# Patient Record
Sex: Female | Born: 1945 | Hispanic: Yes | Marital: Married | Smoking: Never smoker
Health system: Southern US, Community
[De-identification: ages and names within clinical notes are randomized; demographics above are authoritative.]

## PROBLEM LIST (undated history)

## (undated) DIAGNOSIS — I351 Nonrheumatic aortic (valve) insufficiency: Secondary | ICD-10-CM

## (undated) DIAGNOSIS — M1990 Unspecified osteoarthritis, unspecified site: Secondary | ICD-10-CM

## (undated) DIAGNOSIS — R0989 Other specified symptoms and signs involving the circulatory and respiratory systems: Secondary | ICD-10-CM

## (undated) DIAGNOSIS — Z973 Presence of spectacles and contact lenses: Secondary | ICD-10-CM

## (undated) DIAGNOSIS — Z0000 Encounter for general adult medical examination without abnormal findings: Secondary | ICD-10-CM

## (undated) DIAGNOSIS — M25519 Pain in unspecified shoulder: Secondary | ICD-10-CM

## (undated) DIAGNOSIS — I340 Nonrheumatic mitral (valve) insufficiency: Secondary | ICD-10-CM

## (undated) DIAGNOSIS — G8929 Other chronic pain: Secondary | ICD-10-CM

## (undated) DIAGNOSIS — Z972 Presence of dental prosthetic device (complete) (partial): Secondary | ICD-10-CM

## (undated) DIAGNOSIS — N8110 Cystocele, unspecified: Secondary | ICD-10-CM

## (undated) DIAGNOSIS — M25552 Pain in left hip: Secondary | ICD-10-CM

## (undated) DIAGNOSIS — M25539 Pain in unspecified wrist: Secondary | ICD-10-CM

## (undated) DIAGNOSIS — J209 Acute bronchitis, unspecified: Secondary | ICD-10-CM

## (undated) DIAGNOSIS — R011 Cardiac murmur, unspecified: Secondary | ICD-10-CM

## (undated) DIAGNOSIS — M549 Dorsalgia, unspecified: Secondary | ICD-10-CM

## (undated) DIAGNOSIS — M2550 Pain in unspecified joint: Secondary | ICD-10-CM

## (undated) DIAGNOSIS — R000 Tachycardia, unspecified: Secondary | ICD-10-CM

## (undated) DIAGNOSIS — R040 Epistaxis: Principal | ICD-10-CM

## (undated) DIAGNOSIS — I739 Peripheral vascular disease, unspecified: Secondary | ICD-10-CM

## (undated) DIAGNOSIS — I10 Essential (primary) hypertension: Secondary | ICD-10-CM

## (undated) DIAGNOSIS — Z9289 Personal history of other medical treatment: Secondary | ICD-10-CM

## (undated) HISTORY — DX: Personal history of other medical treatment: Z92.89

## (undated) HISTORY — DX: Pain in unspecified joint: M25.50

## (undated) HISTORY — PX: BUNIONECTOMY: SHX129

## (undated) HISTORY — PX: WISDOM TOOTH EXTRACTION: SHX21

## (undated) HISTORY — DX: Tachycardia, unspecified: R00.0

## (undated) HISTORY — DX: Acute bronchitis, unspecified: J20.9

## (undated) HISTORY — DX: Cardiac murmur, unspecified: R01.1

## (undated) HISTORY — DX: Pain in unspecified wrist: M25.539

## (undated) HISTORY — DX: Pain in left hip: M25.552

## (undated) HISTORY — DX: Pain in unspecified shoulder: M25.519

## (undated) HISTORY — DX: Encounter for general adult medical examination without abnormal findings: Z00.00

## (undated) HISTORY — DX: Epistaxis: R04.0

## (undated) HISTORY — DX: Other specified symptoms and signs involving the circulatory and respiratory systems: R09.89

## (undated) HISTORY — PX: APPENDECTOMY: SHX54

## (undated) HISTORY — DX: Other chronic pain: G89.29

## (undated) HISTORY — DX: Essential (primary) hypertension: I10

---

## 1997-10-25 ENCOUNTER — Emergency Department (HOSPITAL_COMMUNITY): Admission: EM | Admit: 1997-10-25 | Discharge: 1997-10-25 | Payer: Self-pay | Admitting: Emergency Medicine

## 1998-03-03 ENCOUNTER — Other Ambulatory Visit: Admission: RE | Admit: 1998-03-03 | Discharge: 1998-03-03 | Payer: Self-pay | Admitting: Gynecology

## 1998-08-05 ENCOUNTER — Ambulatory Visit (HOSPITAL_COMMUNITY): Admission: RE | Admit: 1998-08-05 | Discharge: 1998-08-05 | Payer: Self-pay | Admitting: Gastroenterology

## 1999-01-28 ENCOUNTER — Other Ambulatory Visit: Admission: RE | Admit: 1999-01-28 | Discharge: 1999-01-28 | Payer: Self-pay | Admitting: Gynecology

## 1999-04-05 ENCOUNTER — Emergency Department (HOSPITAL_COMMUNITY): Admission: EM | Admit: 1999-04-05 | Discharge: 1999-04-05 | Payer: Self-pay | Admitting: Internal Medicine

## 1999-04-16 ENCOUNTER — Other Ambulatory Visit: Admission: RE | Admit: 1999-04-16 | Discharge: 1999-04-16 | Payer: Self-pay | Admitting: Gynecology

## 1999-04-16 ENCOUNTER — Encounter (INDEPENDENT_AMBULATORY_CARE_PROVIDER_SITE_OTHER): Payer: Self-pay

## 2000-03-03 ENCOUNTER — Other Ambulatory Visit: Admission: RE | Admit: 2000-03-03 | Discharge: 2000-03-03 | Payer: Self-pay | Admitting: Gynecology

## 2000-05-11 ENCOUNTER — Encounter: Admission: RE | Admit: 2000-05-11 | Discharge: 2000-08-09 | Payer: Self-pay | Admitting: Gynecology

## 2001-03-23 ENCOUNTER — Other Ambulatory Visit: Admission: RE | Admit: 2001-03-23 | Discharge: 2001-03-23 | Payer: Self-pay | Admitting: Gynecology

## 2002-03-23 ENCOUNTER — Other Ambulatory Visit: Admission: RE | Admit: 2002-03-23 | Discharge: 2002-03-23 | Payer: Self-pay | Admitting: Gynecology

## 2003-03-26 ENCOUNTER — Other Ambulatory Visit: Admission: RE | Admit: 2003-03-26 | Discharge: 2003-03-26 | Payer: Self-pay | Admitting: Gynecology

## 2004-04-03 ENCOUNTER — Other Ambulatory Visit: Admission: RE | Admit: 2004-04-03 | Discharge: 2004-04-03 | Payer: Self-pay | Admitting: Gynecology

## 2005-03-29 ENCOUNTER — Ambulatory Visit (HOSPITAL_COMMUNITY): Admission: RE | Admit: 2005-03-29 | Discharge: 2005-03-29 | Payer: Self-pay | Admitting: Gastroenterology

## 2005-03-29 LAB — HM COLONOSCOPY: HM Colonoscopy: NORMAL

## 2005-04-07 ENCOUNTER — Other Ambulatory Visit: Admission: RE | Admit: 2005-04-07 | Discharge: 2005-04-07 | Payer: Self-pay | Admitting: Gynecology

## 2005-04-23 ENCOUNTER — Ambulatory Visit: Payer: Self-pay | Admitting: Internal Medicine

## 2005-07-06 ENCOUNTER — Ambulatory Visit: Payer: Self-pay | Admitting: Internal Medicine

## 2005-07-11 ENCOUNTER — Encounter: Admission: RE | Admit: 2005-07-11 | Discharge: 2005-07-11 | Payer: Self-pay | Admitting: Internal Medicine

## 2006-05-18 ENCOUNTER — Other Ambulatory Visit: Admission: RE | Admit: 2006-05-18 | Discharge: 2006-05-18 | Payer: Self-pay | Admitting: Gynecology

## 2006-05-27 ENCOUNTER — Ambulatory Visit: Payer: Self-pay | Admitting: Internal Medicine

## 2006-08-15 ENCOUNTER — Emergency Department (HOSPITAL_COMMUNITY): Admission: EM | Admit: 2006-08-15 | Discharge: 2006-08-15 | Payer: Self-pay | Admitting: Emergency Medicine

## 2006-08-16 ENCOUNTER — Ambulatory Visit: Payer: Self-pay | Admitting: Internal Medicine

## 2006-08-17 ENCOUNTER — Ambulatory Visit: Payer: Self-pay

## 2006-08-19 ENCOUNTER — Ambulatory Visit: Payer: Self-pay | Admitting: Internal Medicine

## 2006-08-29 ENCOUNTER — Ambulatory Visit: Payer: Self-pay | Admitting: Internal Medicine

## 2006-10-20 ENCOUNTER — Ambulatory Visit: Payer: Self-pay | Admitting: Internal Medicine

## 2006-10-27 ENCOUNTER — Encounter: Admission: RE | Admit: 2006-10-27 | Discharge: 2006-10-27 | Payer: Self-pay | Admitting: Internal Medicine

## 2007-04-26 ENCOUNTER — Ambulatory Visit: Payer: Self-pay | Admitting: Internal Medicine

## 2007-05-31 ENCOUNTER — Ambulatory Visit: Payer: Self-pay | Admitting: Internal Medicine

## 2007-06-01 DIAGNOSIS — J309 Allergic rhinitis, unspecified: Secondary | ICD-10-CM | POA: Insufficient documentation

## 2007-06-01 DIAGNOSIS — I10 Essential (primary) hypertension: Secondary | ICD-10-CM

## 2007-07-05 ENCOUNTER — Encounter: Payer: Self-pay | Admitting: Internal Medicine

## 2007-07-07 ENCOUNTER — Encounter: Payer: Self-pay | Admitting: Internal Medicine

## 2007-11-07 ENCOUNTER — Telehealth: Payer: Self-pay | Admitting: Internal Medicine

## 2007-11-07 ENCOUNTER — Ambulatory Visit: Payer: Self-pay | Admitting: Internal Medicine

## 2007-11-07 DIAGNOSIS — J069 Acute upper respiratory infection, unspecified: Secondary | ICD-10-CM | POA: Insufficient documentation

## 2007-11-07 DIAGNOSIS — R000 Tachycardia, unspecified: Secondary | ICD-10-CM

## 2007-11-07 LAB — CONVERTED CEMR LAB
CO2: 31 meq/L (ref 19–32)
Calcium: 9.8 mg/dL (ref 8.4–10.5)
Creatinine, Ser: 0.8 mg/dL (ref 0.4–1.2)
Free T4: 0.9 ng/dL (ref 0.6–1.6)
GFR calc Af Amer: 93 mL/min
GFR calc non Af Amer: 77 mL/min
Magnesium: 2.2 mg/dL (ref 1.5–2.5)
Sodium: 140 meq/L (ref 135–145)

## 2007-11-08 ENCOUNTER — Telehealth: Payer: Self-pay | Admitting: Internal Medicine

## 2007-11-13 ENCOUNTER — Encounter: Payer: Self-pay | Admitting: Internal Medicine

## 2007-11-13 ENCOUNTER — Ambulatory Visit: Payer: Self-pay

## 2007-11-14 ENCOUNTER — Telehealth: Payer: Self-pay | Admitting: Internal Medicine

## 2007-12-07 ENCOUNTER — Ambulatory Visit: Payer: Self-pay | Admitting: Internal Medicine

## 2007-12-07 DIAGNOSIS — M722 Plantar fascial fibromatosis: Secondary | ICD-10-CM

## 2008-05-10 ENCOUNTER — Ambulatory Visit: Payer: Self-pay | Admitting: Internal Medicine

## 2008-05-10 LAB — CONVERTED CEMR LAB
ALT: 19 units/L (ref 0–35)
Albumin: 4.4 g/dL (ref 3.5–5.2)
Alkaline Phosphatase: 67 units/L (ref 39–117)
BUN: 22 mg/dL (ref 6–23)
Bilirubin, Direct: 0.2 mg/dL (ref 0.0–0.3)
CO2: 31 meq/L (ref 19–32)
Calcium: 9.6 mg/dL (ref 8.4–10.5)
Eosinophils Relative: 3.5 % (ref 0.0–5.0)
GFR calc Af Amer: 108 mL/min
Glucose, Bld: 102 mg/dL — ABNORMAL HIGH (ref 70–99)
HCT: 40.7 % (ref 36.0–46.0)
Hemoglobin: 14.4 g/dL (ref 12.0–15.0)
Leukocytes, UA: NEGATIVE
Lymphocytes Relative: 32.9 % (ref 12.0–46.0)
Monocytes Absolute: 0.5 10*3/uL (ref 0.1–1.0)
Monocytes Relative: 8.9 % (ref 3.0–12.0)
Neutro Abs: 3.3 10*3/uL (ref 1.4–7.7)
Nitrite: NEGATIVE
Potassium: 4.2 meq/L (ref 3.5–5.1)
RDW: 11.3 % — ABNORMAL LOW (ref 11.5–14.6)
Sodium: 142 meq/L (ref 135–145)
Specific Gravity, Urine: 1.01 (ref 1.000–1.03)
Total CHOL/HDL Ratio: 8.8
Total Protein, Urine: NEGATIVE mg/dL
Total Protein: 7.6 g/dL (ref 6.0–8.3)
WBC: 6 10*3/uL (ref 4.5–10.5)
pH: 7 (ref 5.0–8.0)

## 2008-05-14 ENCOUNTER — Ambulatory Visit: Payer: Self-pay | Admitting: Internal Medicine

## 2008-06-25 ENCOUNTER — Encounter: Admission: RE | Admit: 2008-06-25 | Discharge: 2008-06-25 | Payer: Self-pay | Admitting: Obstetrics and Gynecology

## 2008-07-30 ENCOUNTER — Encounter: Payer: Self-pay | Admitting: Gastroenterology

## 2008-07-30 ENCOUNTER — Encounter: Payer: Self-pay | Admitting: Internal Medicine

## 2008-09-05 ENCOUNTER — Encounter: Payer: Self-pay | Admitting: Gastroenterology

## 2008-10-24 ENCOUNTER — Telehealth: Payer: Self-pay | Admitting: Internal Medicine

## 2008-11-27 ENCOUNTER — Telehealth (INDEPENDENT_AMBULATORY_CARE_PROVIDER_SITE_OTHER): Payer: Self-pay | Admitting: *Deleted

## 2008-12-02 ENCOUNTER — Ambulatory Visit: Payer: Self-pay | Admitting: Internal Medicine

## 2008-12-02 ENCOUNTER — Telehealth: Payer: Self-pay | Admitting: Internal Medicine

## 2008-12-02 DIAGNOSIS — R198 Other specified symptoms and signs involving the digestive system and abdomen: Secondary | ICD-10-CM

## 2008-12-02 DIAGNOSIS — K5900 Constipation, unspecified: Secondary | ICD-10-CM | POA: Insufficient documentation

## 2008-12-04 ENCOUNTER — Ambulatory Visit: Payer: Self-pay | Admitting: Internal Medicine

## 2008-12-04 LAB — CONVERTED CEMR LAB
Direct LDL: 135.3 mg/dL
HDL: 26.5 mg/dL — ABNORMAL LOW (ref >39.00)
Total CHOL/HDL Ratio: 8
Triglycerides: 176 mg/dL — ABNORMAL HIGH (ref 0.0–149.0)

## 2008-12-16 ENCOUNTER — Encounter: Payer: Self-pay | Admitting: Internal Medicine

## 2009-07-04 ENCOUNTER — Ambulatory Visit: Payer: Self-pay | Admitting: Internal Medicine

## 2009-07-04 ENCOUNTER — Telehealth (INDEPENDENT_AMBULATORY_CARE_PROVIDER_SITE_OTHER): Payer: Self-pay | Admitting: *Deleted

## 2009-07-04 DIAGNOSIS — R209 Unspecified disturbances of skin sensation: Secondary | ICD-10-CM

## 2009-07-04 DIAGNOSIS — E782 Mixed hyperlipidemia: Secondary | ICD-10-CM

## 2009-07-04 LAB — CONVERTED CEMR LAB
Basophils Relative: 0.8 % (ref 0.0–3.0)
Calcium: 9.9 mg/dL (ref 8.4–10.5)
Cholesterol: 205 mg/dL — ABNORMAL HIGH (ref 0–200)
Creatinine, Ser: 0.6 mg/dL (ref 0.4–1.2)
Direct LDL: 135.5 mg/dL
Eosinophils Absolute: 0.3 10*3/uL (ref 0.0–0.7)
Eosinophils Relative: 4.4 % (ref 0.0–5.0)
GFR calc non Af Amer: 106.53 mL/min (ref >60)
HDL: 28.6 mg/dL — ABNORMAL LOW (ref >39.00)
Hemoglobin: 14.3 g/dL (ref 12.0–15.0)
Lymphocytes Relative: 32.7 % (ref 12.0–46.0)
Monocytes Relative: 7.1 % (ref 3.0–12.0)
Neutrophils Relative %: 55 % (ref 43.0–77.0)
RBC: 4.56 M/uL (ref 3.87–5.11)
Sodium: 140 meq/L (ref 135–145)
Total CHOL/HDL Ratio: 7
Triglycerides: 226 mg/dL — ABNORMAL HIGH (ref 0.0–149.0)
WBC: 7.4 10*3/uL (ref 4.5–10.5)

## 2009-07-08 ENCOUNTER — Encounter: Payer: Self-pay | Admitting: Gastroenterology

## 2009-07-16 ENCOUNTER — Telehealth: Payer: Self-pay | Admitting: Internal Medicine

## 2009-08-01 ENCOUNTER — Ambulatory Visit: Payer: Self-pay | Admitting: Gastroenterology

## 2009-08-01 DIAGNOSIS — K6289 Other specified diseases of anus and rectum: Secondary | ICD-10-CM

## 2009-08-04 ENCOUNTER — Ambulatory Visit: Payer: Self-pay | Admitting: Gastroenterology

## 2009-08-04 ENCOUNTER — Telehealth: Payer: Self-pay | Admitting: Gastroenterology

## 2009-08-04 LAB — HM SIGMOIDOSCOPY

## 2009-08-05 ENCOUNTER — Telehealth (INDEPENDENT_AMBULATORY_CARE_PROVIDER_SITE_OTHER): Payer: Self-pay | Admitting: *Deleted

## 2009-08-12 ENCOUNTER — Ambulatory Visit (HOSPITAL_COMMUNITY): Admission: RE | Admit: 2009-08-12 | Discharge: 2009-08-12 | Payer: Self-pay | Admitting: Gastroenterology

## 2010-05-29 ENCOUNTER — Ambulatory Visit: Payer: Self-pay | Admitting: Internal Medicine

## 2010-07-23 ENCOUNTER — Encounter: Payer: Self-pay | Admitting: Internal Medicine

## 2010-07-23 ENCOUNTER — Ambulatory Visit
Admission: RE | Admit: 2010-07-23 | Discharge: 2010-07-23 | Payer: Self-pay | Source: Home / Self Care | Attending: Internal Medicine | Admitting: Internal Medicine

## 2010-07-23 ENCOUNTER — Other Ambulatory Visit: Payer: Self-pay | Admitting: Internal Medicine

## 2010-07-23 DIAGNOSIS — M25549 Pain in joints of unspecified hand: Secondary | ICD-10-CM | POA: Insufficient documentation

## 2010-07-23 LAB — SEDIMENTATION RATE: Sed Rate: 11 mm/hr (ref 0–22)

## 2010-08-20 NOTE — Assessment & Plan Note (Signed)
Summary: painful joints/last ov 2010/cd   Vital Signs:  Patient profile:   65 year old female Height:      59 inches Weight:      123 pounds BMI:     24.93 O2 Sat:      97 % on Room air Temp:     98.5 degrees F oral Pulse rate:   61 / minute BP sitting:   142 / 88  (left arm) Cuff size:   regular  Vitals Entered By: Ami Bullins CMA (July 23, 2010 10:21 AM)  O2 Flow:  Room air CC: pt here with c/o painful joints (mostly in her hands) symptoms present x 5 months/ ab   Primary Care Provider:  Micheal Maliyah Willets,MD  CC:  pt here with c/o painful joints (mostly in her hands) symptoms present x 5 months/ ab.  History of Present Illness: Patient present c/o pain and swelling in the PIP joints of the left hand, to the point where she can't wear her rings. She has been reading about arthritis and is concerned that she may have RA. She is also inquiring about possible treatments. She has normal use of her hand. She does have a morning gel of less than 15 min. She has normal feeling in the hand.   Current Medications (verified): 1)  Lisinopril 10 Mg  Tabs (Lisinopril) .... Once Daily 2)  Metoprolol Tartrate 25 Mg  Tabs (Metoprolol Tartrate) .... 1/2 Tab Two Times A Day 3)  Fish Oil 1000 Mg Caps (Omega-3 Fatty Acids) .... Take 1 Tablet By Mouth Once A Day 4)  Super B Complex  Tabs (B Complex-C) .Marland Kitchen.. 1 Tablet Daily 5)  Vitamin D3 1000 Unit Caps (Cholecalciferol) .Marland Kitchen.. 1 Capsule Daily  Allergies (verified): 1)  ! Penicillin  Past History:  Past Medical History: Last updated: 08/01/2009 Allergic rhinitis Hypertension tachycardia- episodic   Past Surgical History: Last updated: 08/01/2009 Appendectomy G1P1 FH reviewed for relevance, SH/Risk Factors reviewed for relevance  Review of Systems  The patient denies anorexia, fever, weight loss, weight gain, peripheral edema, abdominal pain, muscle weakness, and enlarged lymph nodes.    Physical Exam  General:   Well-developed,well-nourished,in no acute distress; alert,appropriate and cooperative throughout examination Eyes:  C&S clear Lungs:  normal respiratory effort.   Heart:  normal rate and regular rhythm.   Msk:  DIP left hand enlarged especially 3rd and 4th digits. No erythema, no synovial thickening, well preserved range of motion. Wrist, elbow, shoulder OK.  Neurologic:  alert & oriented X3 and gait normal.   Skin:  turgor normal and color normal.   Psych:  Oriented X3, normally interactive, and good eye contact.     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 Reviewed the mechanism of disease and the difference between RA and OA. Discussed treatments including ROM and exercise, use of warm soaks, use of glucosamine for large joint involvement, APAP as analgesia and NSAIDs as needed for acute inflammation.  Plan - lab to r/o RA - RF, ESR           exercise/ROM and heat. APAP as needed. Forinflammation NSAID of choice with GI precautions given.  Orders: TLB-Sedimentation Rate (ESR) (85652-ESR) T-Rheumatoid Factor (54098-11914)  Addenddum - ESR 15mm/hr, RA negative  Complete Medication List: 1)  Lisinopril 10 Mg Tabs (Lisinopril) .... Once daily 2)  Metoprolol Tartrate 25 Mg Tabs (Metoprolol tartrate) .... 1/2 tab two times  a day 3)  Fish Oil 1000 Mg Caps (Omega-3 fatty acids) .... Take 1 tablet by mouth once a day 4)  Super B Complex Tabs (B complex-c) .Marland Kitchen.. 1 tablet daily 5)  Vitamin D3 1000 Unit Caps (Cholecalciferol) .Marland Kitchen.. 1 capsule daily   Orders Added: 1)  TLB-Sedimentation Rate (ESR) [85652-ESR] 2)  T-Rheumatoid Factor [[address]] 3)  Est. Patient Level III [09811]

## 2010-08-20 NOTE — Assessment & Plan Note (Signed)
History of Present Illness Visit Type: Initial Visit Primary GI MD: Rob Bunting MD Primary Provider: Cristal Deer Requesting Provider: n/a Chief Complaint: second opinion History of Present Illness:       is very pleasant 65 year old [place] born woman who had colonoscopy with Dr. Loreta Ave in 2006, no polyps or cancers.  she did have diverticulosis.   February 2010 was found to have anal fissure.  She was given diltiazem ointment.  This did not help.  she saw Dr. Yetta Barre who gave her proctofoam, this helped but did not get rid of the anal discomfort (has anal pain, constant, not throbbing, dull pain). Having a BM does cuase a bit of pain while the BM is passing through.  She never sees rectal bleeding.    she actually tells me that the pain is not anal pain but is more higher up in her rectum.  Went back to Dr. Kenna Gilbert office, was very unsatisfied with her visit at that point.           Current Medications (verified): 1)  Lisinopril 10 Mg  Tabs (Lisinopril) .... Once Daily 2)  Calcium 500 Mg Tabs (Calcium Carbonate) .... Take 1 Tablet By Mouth Two Times A Day 3)  Metoprolol Tartrate 25 Mg  Tabs (Metoprolol Tartrate) .... 1/2 Tab Two Times A Day 4)  Fish Oil 1000 Mg Caps (Omega-3 Fatty Acids) .... Take 1 Tablet By Mouth Once A Day 5)  Aspirin 81 Mg Tbec (Aspirin) .Marland Kitchen.. 1 By Mouth Once Daily  Allergies (verified): 1)  ! 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 Never Smoked Alcohol use-no Drug use-no  Regular  exercise-yes  Review of Systems       Pertinent positive and negative review of systems were noted in the above HPI and GI specific review of systems.  All other review of systems was otherwise negative.   Vital Signs:  Patient profile:   65 year old female Height:      59 inches Weight:      125 pounds BMI:     25.34 BSA:     1.51 Pulse rate:   62 / minute Pulse rhythm:   regular BP sitting:   104 / 70  (left arm)  Vitals Entered By: Merri Ray CMA Duncan Dull) (August 01, 2009 3:27 PM)  Physical Exam  Additional Exam:  Constitutional: generally well appearing Psychiatric: alert and oriented times 3 Eyes: extraocular movements intact Mouth: oropharynx moist, no lesions Neck: supple, no lymphadenopathy Cardiovascular: heart regular rate and rythm Lungs: CTA bilaterally Abdomen: soft, non-tender, non-distended, no obvious ascites, no peritoneal signs, normal bowel sounds Extremities: no lower extremity edema bilaterally Skin: no lesions on visible extremities Ano rectal examination with female CMA in room: no obvious external anal fissures or hemorrhoids, no exquisite anal tenderness, no rectal masses, she was tender, fairly focally, about 5-6 cm into her rectum on the right lateral wall. I could detect no fluctuance or masses underneath this. Stool was heme negative, brown   Impression & Recommendations:  Problem # 1:  rectal discomfort she has been told that she  has an anal fissure however she has never really had anal pain and I could not find a seizure or hemorrhoids on examination now. She does not have exquisite anal tenderness which is fairly common with anal fissures. She does have point tenderness about 5-6 cm into her rectum on the right lateral sidewall. There were no clear masses here in the stool is brown and Hemoccult negative. I think we should proceed with flexible sigmoidoscopy to rule out neoplastic, colonic causes although I think this will be negative. If  indeed it is negative then I would set her up with a pelvic MRI.  Patient Instructions: 1)  You will be scheduled to have a flexible sigmoidoscopy. 2)  If this is unrevealing, will likely set up a pelvic MRI. 3)  A copy of this information will be sent to Dr. Debby Bud. 4)  The medication list was reviewed and reconciled.  All changed / newly prescribed medications were explained.  A complete medication list was provided to the patient / caregiver.  Appended Document: Orders Update/Flex    Clinical Lists Changes  Orders: Added new Test order of Flex with Sedation (Flex w/Sed) - Signed

## 2010-08-20 NOTE — Letter (Signed)
Summary: [facility]  [address] [place], [place] [address]   Phone: [phone]  Fax: [phone]       Eileen Hansen    May 09, 1946    MRN: [id number]        Procedure Day /Date:08/04/09     Arrival Time:9 am     Procedure Time:10 am     Location of Procedure:                    X  [facility] (4th Floor)    PREPARATION FOR FLEXIBLE SIGMOIDOSCOPY WITH MAGNESIUM CITRATE  Prior to the day before your procedure, purchase one 8 oz. bottle of Magnesium Citrate and one Fleet Enema from the laxative section of your drugstore.  _________________________________________________________________________________________________  THE DAY BEFORE YOUR PROCEDURE             DATE: 08/03/09 DAY: SUN  1.   Have a clear liquid dinner the night before your procedure.  2.   Do not drink anything colored red or purple.  Avoid juices with pulp.  No orange juice.              CLEAR LIQUIDS INCLUDE: Water Jello Ice Popsicles Tea (sugar ok, no milk/cream) Powdered fruit flavored drinks Coffee (sugar ok, no milk/cream) Gatorade Juice: apple, white grape, white cranberry  Lemonade Clear bullion, consomm, broth Carbonated beverages (any kind) Strained chicken noodle soup Hard Candy   3.   At 7:00 pm the night before your procedure, drink one bottle of Magnesium Citrate over ice.  4.   Drink at least 3 more glasses of clear liquids before bedtime (preferably juices).  5.   Results are expected usually within 1 to 6 hours after taking the Magnesium Citrate.  ___________________________________________________________________________________________________  THE DAY OF YOUR PROCEDURE            DATE: 08/04/09 DAY: MON  1.   Use Fleet Enema one hour prior to coming for procedure.  2.   You may drink clear liquids until 8 am(2 hours before exam)       MEDICATION INSTRUCTIONS  Unless otherwise instructed, you should take regular  prescription medications with a small sip of water as early as possible the morning of your procedure.           OTHER INSTRUCTIONS  You will need a responsible adult at least 67 years of age to accompany you and drive you home.   This person must remain in the waiting room during your procedure.  Wear loose fitting clothing that is easily removed.  Leave jewelry and other valuables at home.  However, you may wish to bring a book to read or an iPod/MP3 player to listen to music as you wait for your procedure to start.  Remove all body piercing jewelry and leave at home.  Total time from sign-in until discharge is approximately 2-3 hours.  You should go home directly after your procedure and rest.  You can resume normal activities the day after your procedure.  The day of your procedure you should not:   Drive   Make legal decisions   Operate machinery   Drink alcohol   Return to work  You will receive specific instructions about eating, activities and medications before you leave.   The above instructions have been reviewed and explained to me by   _______________________    I fully understand and can verbalize these instructions _____________________________ Date _________

## 2010-08-20 NOTE — Procedures (Signed)
Summary: Flexible Sigmoidoscopy  Patient: Eileen Hansen Note: All result statuses are Final unless otherwise noted.  Tests: (1) Flexible Sigmoidoscopy (FLX)  FLX Flexible Sigmoidoscopy                             DONE     [facility]     [address]     [place], [place]  [address]           FLEXIBLE SIGMOIDOSCOPY PROCEDURE REPORT           PATIENT:  Eileen Hansen, Eileen Hansen  MR#:  [id number]     BIRTHDATE:  29-Oct-1945, 63 yrs. old  GENDER:  female           ENDOSCOPIST:  Rachael Fee, MD     Referred by:  Rosalyn Gess. Norins, M.D.           PROCEDURE DATE:  08/04/2009     PROCEDURE:  Flexible Sigmoidoscopy, diagnostic     ASA CLASS:  Class II     INDICATIONS:  rectal discomfort           MEDICATIONS:   Fentanyl 50 mcg IV, Versed 5 mg IV           DESCRIPTION OF PROCEDURE:   After the risks benefits and     alternatives of the procedure were thoroughly explained, informed     consent was obtained.  Digital rectal exam was performed and     revealed no rectal masses.   The [id number] endoscope     was introduced through the anus and advanced to the transverse     colon, without limitations.  The quality of the prep was adequate.     The instrument was then slowly withdrawn as the mucosa was fully     examined.     <<PROCEDUREIMAGES>>           Internal hemorrhoids were found. These were small and not     thrombosed.  The examination was otherwise normal (see image1,     image3, image4, and image6).   Retroflexed views in the rectum     revealed no abnormalities.    The scope was then withdrawn from     the patient and the procedure terminated.           COMPLICATIONS:  None           ENDOSCOPIC IMPRESSION:     1) Small internal hemorrhoids     2) Otherwise normal examination.  No fissures.  No proctitis.     No explanation for the rectal discomfort.           RECOMMENDATIONS:     My office will arrange for you to have a pelvic MRI to examine     rectal  discomfort.           ______________________________     Rachael Fee, MD           n.     eSIGNED:   Rachael Fee at 08/04/2009 10:40 AM           Eileen Hansen, [id number]  Note: An exclamation mark (!) indicates a result that was not dispersed into the flowsheet. Document Creation Date: 08/04/2009 10:39 AM _______________________________________________________________________  (1) Order result status: Final Collection or observation date-time: 08/04/2009 10:33 Requested date-time:  Receipt date-time:  Reported date-time:  Referring Physician:   Ordering Physician: Rob Bunting (  [id number]) Specimen Source:  Source: Launa Grill Order Number: [id number] Lab site:

## 2010-08-20 NOTE — Progress Notes (Signed)
Summary: Questions about Procedure   Phone Note Call from Patient Call back at Home Phone [phone]   Caller: Patient Call For: Dr. Christella Hartigan Reason for Call: Talk to Nurse Summary of Call: Pt has an appt. today and some questions about her procedure Initial call taken by: Karna Christmas,  August 04, 2009 8:13 AM  Follow-up for Phone Call        pt arrived to [facility] for sigmoidoscopy boefore I could return her call Follow-up by: Ezra Sites RN,  August 04, 2009 9:25 AM

## 2010-08-20 NOTE — Assessment & Plan Note (Signed)
Summary: FLU SHOT/PN   Nurse Visit   Allergies: 1)  ! Penicillin  Orders Added: 1)  Admin 1st Vaccine [90471] 2)  Flu Vaccine 12yrs + [04540]     Flu Vaccine Consent Questions     Do you have a history of severe allergic reactions to this vaccine? no    Any prior history of allergic reactions to egg and/or gelatin? no    Do you have a sensitivity to the preservative Thimersol? no    Do you have a past history of Guillan-Barre Syndrome? no    Do you currently have an acute febrile illness? no    Have you ever had a severe reaction to latex? no    Vaccine information given and explained to patient? yes    Are you currently pregnant? no    Lot Number:[id number]   Exp Date:01/16/2011   Site Given  Left Deltoid IM1

## 2010-08-20 NOTE — Progress Notes (Signed)
Summary: MRI   Phone Note Outgoing Call Call back at [facility] Phone [phone]   Call placed by: Chales Abrahams CMA Duncan Dull),  August 05, 2009 4:37 PM Summary of Call: pt schedueld for Pelvic MRI date and time.  Pt is aware and verbalized understanding  Initial call taken by: Chales Abrahams CMA Duncan Dull),  August 05, 2009 4:37 PM

## 2010-11-02 ENCOUNTER — Encounter: Payer: Self-pay | Admitting: *Deleted

## 2010-11-02 ENCOUNTER — Other Ambulatory Visit (INDEPENDENT_AMBULATORY_CARE_PROVIDER_SITE_OTHER): Payer: Medicare Other

## 2010-11-02 ENCOUNTER — Other Ambulatory Visit (INDEPENDENT_AMBULATORY_CARE_PROVIDER_SITE_OTHER): Payer: Medicare Other | Admitting: Internal Medicine

## 2010-11-02 ENCOUNTER — Encounter: Payer: Self-pay | Admitting: Internal Medicine

## 2010-11-02 ENCOUNTER — Ambulatory Visit (INDEPENDENT_AMBULATORY_CARE_PROVIDER_SITE_OTHER): Payer: Medicare Other | Admitting: Internal Medicine

## 2010-11-02 DIAGNOSIS — R7989 Other specified abnormal findings of blood chemistry: Secondary | ICD-10-CM

## 2010-11-02 DIAGNOSIS — Z23 Encounter for immunization: Secondary | ICD-10-CM

## 2010-11-02 DIAGNOSIS — I10 Essential (primary) hypertension: Secondary | ICD-10-CM

## 2010-11-02 DIAGNOSIS — E785 Hyperlipidemia, unspecified: Secondary | ICD-10-CM

## 2010-11-02 LAB — LIPID PANEL
HDL: 30.8 mg/dL — ABNORMAL LOW (ref >39.00)
VLDL: 70.2 mg/dL — ABNORMAL HIGH (ref 0.0–40.0)

## 2010-11-02 LAB — COMPREHENSIVE METABOLIC PANEL
ALT: 19 U/L (ref 0–35)
AST: 20 U/L (ref 0–37)
Calcium: 9.5 mg/dL (ref 8.4–10.5)
Chloride: 103 mEq/L (ref 96–112)
Creatinine, Ser: 0.7 mg/dL (ref 0.4–1.2)
Sodium: 140 mEq/L (ref 135–145)
Total Bilirubin: 0.4 mg/dL (ref 0.3–1.2)
Total Protein: 7.2 g/dL (ref 6.0–8.3)

## 2010-11-02 LAB — LDL CHOLESTEROL, DIRECT: Direct LDL: 124.1 mg/dL

## 2010-11-03 ENCOUNTER — Encounter: Payer: Self-pay | Admitting: Internal Medicine

## 2010-11-03 MED ORDER — HEPATITIS A VACCINE 720 EL U/0.5ML IM SUSP: 0.5000 mL | Freq: Once | INTRAMUSCULAR | Status: DC

## 2010-11-03 NOTE — Progress Notes (Signed)
  Subjective:    Patient ID: Eileen Hansen, female    DOB: [birth year], 65 y.o.   MRN: [id number]  HPI Eileen Hansen presents with several questions in preparation for travel to [place]: immunization - check CDC website - need hep A plus usual adult immuniations.  She asks about glucosamine and vit E - both are OK and helpful for joint pain and low HDL respectfully. Her last lipid profile with an LDL 135, HDL 28.  Questions about general health status which is good   Past Medical History  Diagnosis Date  . Allergic rhinitis   . HTN (hypertension)   . Tachycardia     Episodic   Past Surgical History  Procedure Date  . Appendectomy    Family History  Problem Relation Age of Onset  . Cancer Father   . Colon cancer Neg Hx   . Breast cancer Neg Hx   . Coronary artery disease Neg Hx    History   Social History  . Marital Status: Married    Spouse Name: N/A    Number of Children: N/A  . Years of Education: N/A   Occupational History  . Homemaker    Social History Main Topics  . Smoking status: Never Smoker   . Smokeless tobacco: Not on file  . Alcohol Use: No  . Drug Use: No  . Sexually Active: Not on file   Other Topics Concern  . 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  HENT:  Negative for hearing loss, ear pain, congestion, neck stiffness and postnasal drip.   Eyes: Negative for pain, discharge and visual disturbance.  Respiratory: Negative for chest tightness and wheezing.   Cardiovascular: Negative for chest pain and palpitations.       [No decreased exercise tolerance Gastrointestinal: [No change in bowel habit. No bloating or gas. No reflux or  indigestion Genitourinary: Negative for urgency, frequency, flank pain and difficulty urinating.  Musculoskeletal: Negative for myalgias, back pain, arthralgias and gait problem.  Neurological: Negative for dizziness, tremors, weakness and headaches.  Hematological: Negative for adenopathy.  Psychiatric/Behavioral: Negative for behavioral problems and dysphoric mood.    Objective:   Physical Exam  Constitutional: She is oriented to person, place, and time. She appears well-developed and well-nourished. No distress.  Eyes: Conjunctivae and EOM are normal. Pupils are equal, round, and reactive to light.  Neck: Neck supple.  Cardiovascular: Normal rate and regular rhythm.   Pulmonary/Chest: Effort normal and breath sounds normal.  Abdominal: Soft. Bowel sounds are normal.  Musculoskeletal: Normal range of motion.  Neurological: She is alert and oriented to person, place, and time. She has normal reflexes.  Skin: Skin is warm and dry.  Psychiatric: She has a normal mood and affect.          Assessment & Plan:  1. Travel advice - for Hep a today, follow-up shot 6 months  2. Joint pain - ok for glucosamine  3. Low HDL - OK for fish oil  (greater than 50% 25 min visit on education and counseling)

## 2010-11-03 NOTE — Progress Notes (Signed)
Addended byRosalio Macadamia, Leviticus Harton on: 11/03/2010 02:17 PM   Modules accepted: Orders

## 2010-11-05 ENCOUNTER — Encounter: Payer: Self-pay | Admitting: Internal Medicine

## 2010-12-04 NOTE — Op Note (Signed)
NAMEEUGENE, ZEIDERS                  ACCOUNT NO.:  [id number]   MEDICAL RECORD NO.:  [id number]          PATIENT TYPE:  AMB   LOCATION:  ENDO                         FACILITY:  [facility]   PHYSICIAN:  Anselmo Rod, M.D.  DATE OF BIRTH:  July 18, 1946   DATE OF PROCEDURE:  03/29/2005  DATE OF DISCHARGE:                                 OPERATIVE REPORT   PROCEDURE PERFORMED:  Screening colonoscopy.   ENDOSCOPIST:  Charna Elizabeth, M.D.   INSTRUMENT USED:  Olympus video colonoscope.   INDICATIONS FOR PROCEDURE:  The patient is a 65 year old female with a  history of constipation undergoing screening colonoscopy to rule out colonic  polyps, masses, etc.   PREPROCEDURE PREPARATION:  Informed consent was procured from the patient.  The patient was fasted for eight hours prior to the procedure and prepped  with a bottle of magnesium citrate and a gallon of GoLYTELY the night prior  to the procedure.  The risks and benefits of the procedure including a 10%  miss rate for colon polyps or cancers was discussed with the patient as  well.   PREPROCEDURE PHYSICAL:  The patient had stable vital signs.  Neck supple.  Chest clear to auscultation.  S1 and S2 regular.  Abdomen soft with normal  bowel sounds.   DESCRIPTION OF PROCEDURE:  The patient was placed in left lateral decubitus  position and sedated with 75 mg of Demerol and 5 mg of Versed in slow  incremental doses.  Once the patient was adequately sedated and maintained  on low flow oxygen and continuous cardiac monitoring, the Olympus video  colonoscope was advanced from the rectum to the cecum.  The appendicular  orifice and ileocecal valve were clearly visualized and photographed.  The  patient had evidence of large sigmoid diverticula with stool in several of  them.  Small internal hemorrhoids were seen on retroflexion.  No masses or  polyps were identified.  The patient tolerated the procedure well without  complication.   IMPRESSION:  1.  Sigmoid diverticulosis.  2.  Small internal hemorrhoids.  3.  No masses or polyps seen.   RECOMMENDATIONS:  1.  Continue high fiber diet with liberal fluid intake. Brochures on      diverticulosis were given to the patient for her education.  2.  Repeat colonoscopy in the next 10 years unless the patient develops any      abnormal symptoms in the interim.  3.  A high fiber diet with liberal fluid intake has been advocated.  4.  Outpatient followup as need arises in the future.      Anselmo Rod, M.D.  Electronically Signed     JNM/MEDQ  D:  03/29/2005  T:  03/29/2005  Job:  [id number]   cc:   Gaetano Hawthorne. Lily Peer, M.D.  [address]  [place]  [place] [address]  Fax: [phone]   Rosalyn Gess. Norins, M.D. [facility]  [address]  [place]  [place] [address]

## 2010-12-04 NOTE — Assessment & Plan Note (Signed)
[facility]                                 ON-CALL NOTE   NAME:[name], Eileen Hansen                           MRN:          [id number]  DATE:08/15/2006                            DOB:          30-Sep-1945    Patient of Dr. Debby Bud.   Phone call comes from her husband, Eileen Hansen at [phone] at 7:20 p.m.  on January 28.   She is in general good health, does not really have heart trouble, feels  okay without chest pain or shortness of breath, but her blood pressure  monitor is registering her as being tachycardic. The heart rate was 106-  108 before but now is 145 and then, as they were talking to me, up to  157 and saying it is irregular. He asked me for advice.   PLAN:  He did mention that he is not sure he trusts the monitor but I  told him that I could not be comfortable with a heart rate that could be  140-150 and he needs to bring her to the emergency room. He was not  happy with that. I told him that he could try to check her pulse if he  knew how to do that but if he could not, he needed to bring her to the  emergency room because that heart rate was markedly abnormal and  deserved immediate attention.     Karie Schwalbe, MD  Electronically Signed    RIL/MedQ  DD: 08/15/2006  DT: 08/16/2006  Job #: [id number]   cc:   Rosalyn Gess. Norins, MD

## 2011-02-13 ENCOUNTER — Other Ambulatory Visit: Payer: Self-pay | Admitting: Internal Medicine

## 2011-03-29 ENCOUNTER — Ambulatory Visit: Payer: Medicare Other | Admitting: Internal Medicine

## 2011-03-29 ENCOUNTER — Other Ambulatory Visit (INDEPENDENT_AMBULATORY_CARE_PROVIDER_SITE_OTHER): Payer: Medicare Other

## 2011-03-29 VITALS — BP 130/72 | HR 64 | Temp 97.9°F | Wt 122.0 lb

## 2011-03-29 DIAGNOSIS — R7989 Other specified abnormal findings of blood chemistry: Secondary | ICD-10-CM

## 2011-03-29 DIAGNOSIS — I10 Essential (primary) hypertension: Secondary | ICD-10-CM

## 2011-03-29 DIAGNOSIS — Z0000 Encounter for general adult medical examination without abnormal findings: Secondary | ICD-10-CM

## 2011-03-29 DIAGNOSIS — R209 Unspecified disturbances of skin sensation: Secondary | ICD-10-CM

## 2011-03-29 DIAGNOSIS — Z136 Encounter for screening for cardiovascular disorders: Secondary | ICD-10-CM

## 2011-03-29 DIAGNOSIS — Z23 Encounter for immunization: Secondary | ICD-10-CM

## 2011-03-29 LAB — COMPREHENSIVE METABOLIC PANEL WITH GFR
ALT: 17 U/L (ref 0–35)
AST: 20 U/L (ref 0–37)
Albumin: 4.3 g/dL (ref 3.5–5.2)
Alkaline Phosphatase: 64 U/L (ref 39–117)
BUN: 21 mg/dL (ref 6–23)
CO2: 27 meq/L (ref 19–32)
Calcium: 9.5 mg/dL (ref 8.4–10.5)
Chloride: 108 meq/L (ref 96–112)
Creatinine, Ser: 0.8 mg/dL (ref 0.4–1.2)
GFR: 71.87 mL/min
Glucose, Bld: 94 mg/dL (ref 70–99)
Potassium: 4.1 meq/L (ref 3.5–5.1)
Sodium: 142 meq/L (ref 135–145)
Total Bilirubin: 0.5 mg/dL (ref 0.3–1.2)
Total Protein: 7.2 g/dL (ref 6.0–8.3)

## 2011-03-29 LAB — LIPID PANEL
Cholesterol: 194 mg/dL (ref 0–200)
HDL: 32.6 mg/dL — ABNORMAL LOW
LDL Cholesterol: 130 mg/dL — ABNORMAL HIGH (ref 0–99)
Total CHOL/HDL Ratio: 6
Triglycerides: 156 mg/dL — ABNORMAL HIGH (ref 0.0–149.0)
VLDL: 31.2 mg/dL (ref 0.0–40.0)

## 2011-03-29 LAB — HEPATIC FUNCTION PANEL
Albumin: 4.3 g/dL (ref 3.5–5.2)
Alkaline Phosphatase: 64 U/L (ref 39–117)
Bilirubin, Direct: 0.1 mg/dL (ref 0.0–0.3)
Total Bilirubin: 0.5 mg/dL (ref 0.3–1.2)

## 2011-03-29 LAB — TSH: TSH: 1.79 u[IU]/mL (ref 0.35–5.50)

## 2011-03-29 MED ORDER — PNEUMOCOCCAL VAC POLYVALENT 25 MCG/0.5ML IJ INJ: 0.5000 mL | INJECTION | Freq: Once | INTRAMUSCULAR | Status: DC

## 2011-03-29 MED ORDER — TETANUS-DIPHTH-ACELL PERTUSSIS 5-2.5-18.5 LF-MCG/0.5 IM SUSP: 0.5000 mL | Freq: Once | INTRAMUSCULAR | Status: DC

## 2011-03-29 NOTE — Progress Notes (Signed)
Subjective:    Patient ID: Eileen Hansen, female    DOB: February 12, 1946, 65 y.o.   MRN: [id number]  HPI  The patient is here for annual Medicare wellness examination and management of other chronic and acute problems. She is feeling well. She had a great trip to [place].    The risk factors are reflected in the social history.  The roster of all physicians providing medical care to patient - is listed in the Snapshot section of the chart.  Activities of daily living:  The patient is 100% inedpendent in all ADLs: dressing, toileting, feeding as well as independent mobility  Home safety : The patient has smoke detectors in the home. They wear seatbelts. Firearms are present in the home, kept in a safe fashion. There is no violence in the home.   There is no risks for hepatitis, STDs or HIV. There is no   history of blood transfusion. They have no travel history to infectious disease endemic areas of the world.  The patient has seen their dentist in the last six month. They have seen their eye doctor in the last year. They deny any hearing difficulty and have not had audiologic testing in the last year.  They do not  have excessive sun exposure. Discussed the need for sun protection: hats, long sleeves and use of sunscreen if there is significant sun exposure.   Diet: the importance of a healthy diet is discussed. They do have a healthy (unhealthy-high fat/fast food) diet.  The patient does not have a regular exercise program but states she stays busy cleaning and doing chores around the house.  The benefits of regular aerobic exercise were discussed.  Depression screen: there are no signs or vegative symptoms of depression- irritability, change in appetite, anhedonia, sadness/tearfullness.  Cognitive assessment: the patient manages all their financial and personal affairs and is actively engaged. They could relate day,date,year and events; recalled 3/3 objects at 3 minutes.  The following  portions of the patient's history were reviewed and updated as appropriate: allergies, current medications, past family history, past medical history,  past surgical history, past social history  and problem list.  Vision, hearing, body mass index were assessed and reviewed.   During the course of the visit the patient was educated and counseled about appropriate screening and preventive services including : fall prevention , diabetes screening, nutrition counseling, colorectal cancer screening, and recommended immunizations.  Past Medical History  Diagnosis Date  . Allergic rhinitis   . HTN (hypertension)   . Tachycardia     Episodic   Past Surgical History  Procedure Date  . Appendectomy    Family History  Problem Relation Age of Onset  . Cancer Father   . Colon cancer Neg Hx   . Breast cancer Neg Hx   . Coronary artery disease Neg Hx    History   Social History  . Marital Status: Married    Spouse Name: N/A    Number of Children: N/A  . Years of Education: N/A   Occupational History  . Homemaker    Social History Main Topics  . Smoking status: Never Smoker   . Smokeless tobacco: Not on file  . Alcohol Use: No  . Drug Use: No  . Sexually Active: Not on file   Other Topics Concern  . Not on file   Social History Narrative   University - [place], Masters Degree - counseling, Mater's [employer] counselingMarried '69 - 7 years, divorced; married '[id number] daughter - '  72; 2 grandchildrenRegular Exercise -  YES        Review of Systems Review of Systems  Constitutional:  Negative for fever, chills, activity change and unexpected weight change.  HEENT:  Negative for hearing loss, ear pain, congestion, neck stiffness and postnasal drip. Negative for sore throat or swallowing problems. Negative for dental complaints.   Eyes: Negative for vision loss or change in visual acuity.  Respiratory: Negative for chest tightness and wheezing.   Cardiovascular: Negative for chest pain  and palpitation. No decreased exercise tolerance Gastrointestinal: No change in bowel habit. No bloating or gas. No reflux or indigestion Genitourinary: Negative for urgency, frequency, flank pain and difficulty urinating.  Musculoskeletal: Negative for myalgias, back pain, arthralgias and gait problem except for pain in the small joints of the hands.  Neurological: Negative for dizziness, tremors, weakness and headaches.  Hematological: Negative for adenopathy.  Psychiatric/Behavioral: Negative for behavioral problems and dysphoric mood.       Objective:   Physical Exam Vitals reviewed. Gen'l: well nourished, well developed [place] woman in no distress HEENT - [place]/AT, EACs/TMs normal, oropharynx with native dentition in good condition, no buccal or palatal lesions, posterior pharynx clear, mucous membranes moist. C&S clear, PERRLA, fundi - normal Neck - supple, no thyromegaly Nodes- negative submental, cervical, supraclavicular regions Chest - no deformity, no CVAT Lungs - cleat without rales, wheezes. No increased work of breathing Breast - deferred to gyn Cardiovascular - regular rate and rhythm, quiet precordium, no murmurs, rubs or gallops, 2+ radial, DP and PT pulses Abdomen - BS+ x 4, no HSM, no guarding or rebound or tenderness Pelvic - deferred to gyn Rectal - deferred to gyn Extremities - no clubbing, cyanosis, edema or deformity.  Neuro - A&O x 3, CN II-XII normal, motor strength normal and equal, DTRs 2+ and symmetrical biceps, radial, and patellar tendons. Cerebellar - no tremor, no rigidity, fluid movement and normal gait. Derm - Head, neck, back, abdomen and extremities without suspicious lesions  Lab Results  Component Value Date   WBC 7.4 07/04/2009   HGB 14.3 07/04/2009   HCT 41.9 07/04/2009   PLT 258.0 07/04/2009   CHOL 194 03/29/2011   TRIG 156.0* 03/29/2011   HDL 32.60* 03/29/2011   LDLDIRECT 124.1 11/02/2010   ALT 17 03/29/2011   ALT 17 03/29/2011   AST  20 03/29/2011   AST 20 03/29/2011   NA 142 03/29/2011   K 4.1 03/29/2011   CL 108 03/29/2011   CREATININE 0.8 03/29/2011   BUN 21 03/29/2011   CO2 27 03/29/2011   TSH 1.79 03/29/2011   HGBA1C 5.5 05/10/2008        Glucose                 94        Assessment & Plan:

## 2011-03-31 DIAGNOSIS — Z0000 Encounter for general adult medical examination without abnormal findings: Secondary | ICD-10-CM | POA: Insufficient documentation

## 2011-03-31 NOTE — Assessment & Plan Note (Signed)
General labs including lipid panel were ok. A1C was normal - no evidence of diabetes

## 2011-03-31 NOTE — Assessment & Plan Note (Signed)
BP Readings from Last 3 Encounters:  03/29/11 130/72  11/02/10 118/80  07/23/10 142/88   Good control on present medical regimen. No changes

## 2011-03-31 NOTE — Assessment & Plan Note (Signed)
Interval history is benign. Physical exam, sans breast and pelvic, is normal. Lab results are in normal range. She is current w/ mammography with last study Jan '12; current with colonoscopy w/ last study Sept '06. Immunizations: she is due for Tdap, pneumonia vaccine and shingles vaccine. 12 lead EKG is unremarkable w/ no evidence of ischemia or injury.  In summary - a delightful woman who is medically stable. She is encouraged to increase her amount of exercise. She will return as needed or in 2 years for a general exam.

## 2011-04-05 ENCOUNTER — Encounter: Payer: Self-pay | Admitting: Internal Medicine

## 2011-05-21 ENCOUNTER — Ambulatory Visit (INDEPENDENT_AMBULATORY_CARE_PROVIDER_SITE_OTHER): Payer: Medicare Other | Admitting: Internal Medicine

## 2011-05-21 VITALS — BP 142/82 | HR 72 | Temp 98.3°F

## 2011-05-21 DIAGNOSIS — R05 Cough: Secondary | ICD-10-CM

## 2011-05-21 DIAGNOSIS — A493 Mycoplasma infection, unspecified site: Secondary | ICD-10-CM

## 2011-05-21 DIAGNOSIS — R5383 Other fatigue: Secondary | ICD-10-CM

## 2011-05-21 MED ORDER — BENZONATATE 100 MG PO CAPS: 100.0000 mg | ORAL_CAPSULE | Freq: Three times a day (TID) | ORAL | Status: AC | PRN

## 2011-05-21 MED ORDER — AZITHROMYCIN 250 MG PO TABS: ORAL_TABLET | ORAL | Status: AC

## 2011-05-21 NOTE — Patient Instructions (Signed)
Cough and malaise is very suggestive of mycoplasma pneumoniae (NOT PNEUMONIA). Plan - z-pak (antibiotic); tessalon perles three times a day for cough; delsym or robitussin DM are fine; hydration; rest.

## 2011-05-21 NOTE — Progress Notes (Signed)
  Subjective:    Patient ID: Eileen Hansen, female    DOB: 1945-08-16, 65 y.o.   MRN: [id number]  HPI Mrs. Schweppe presents with a 1 week of weakness and malaise and a 4 day history of a dry paroxysmal cough that is severe. She does not report having any fever. She has had some sputum production . No nausea or vomiting, no change in bowel habit. No SOB/DOE.  I have reviewed the patient's medical history in detail and updated the computerized patient record.    Review of Systems System review is negative for any constitutional, cardiac, pulmonary, GI or neuro symptoms or complaints other than as described in the HPI.     Objective:   Physical Exam Vitals stable Gen'l- ill and tired appearing woman in no distress HEENT C&S clear Cor- RRR Pulm- no increased work of breathing, lungs are clear w/o rales or wheezes.       Assessment & Plan:  Mycoplasma - fatigue and cough and weakness all suggestive of mycoplasma pneumoniae  Plan - Z-pak           Tessalon perles           otc cough suppressant.

## 2011-08-24 ENCOUNTER — Other Ambulatory Visit: Payer: Self-pay | Admitting: Obstetrics and Gynecology

## 2011-08-24 DIAGNOSIS — N644 Mastodynia: Secondary | ICD-10-CM

## 2011-08-24 DIAGNOSIS — N6320 Unspecified lump in the left breast, unspecified quadrant: Secondary | ICD-10-CM

## 2011-09-07 ENCOUNTER — Ambulatory Visit
Admission: RE | Admit: 2011-09-07 | Discharge: 2011-09-07 | Disposition: A | Discharge status: Home / Self Care | Payer: Medicare Other | Source: Ambulatory Visit | Attending: Obstetrics and Gynecology | Admitting: Obstetrics and Gynecology

## 2011-09-07 ENCOUNTER — Other Ambulatory Visit: Payer: Self-pay | Admitting: Obstetrics and Gynecology

## 2011-09-07 DIAGNOSIS — N6320 Unspecified lump in the left breast, unspecified quadrant: Secondary | ICD-10-CM

## 2011-09-07 DIAGNOSIS — N644 Mastodynia: Secondary | ICD-10-CM

## 2011-09-24 ENCOUNTER — Other Ambulatory Visit: Payer: Self-pay

## 2011-09-24 MED ORDER — LISINOPRIL 10 MG PO TABS: 10.0000 mg | ORAL_TABLET | Freq: Every day | ORAL | Status: DC

## 2012-01-28 ENCOUNTER — Telehealth: Payer: Self-pay | Admitting: Internal Medicine

## 2012-01-28 ENCOUNTER — Ambulatory Visit (INDEPENDENT_AMBULATORY_CARE_PROVIDER_SITE_OTHER): Payer: Medicare Other | Admitting: Internal Medicine

## 2012-01-28 VITALS — BP 174/92 | HR 68

## 2012-01-28 DIAGNOSIS — I10 Essential (primary) hypertension: Secondary | ICD-10-CM

## 2012-01-28 MED ORDER — HYDROCHLOROTHIAZIDE 12.5 MG PO CAPS: 12.5000 mg | ORAL_CAPSULE | Freq: Every day | ORAL | Status: DC

## 2012-01-28 MED ORDER — LISINOPRIL 10 MG PO TABS: 20.0000 mg | ORAL_TABLET | Freq: Every day | ORAL | Status: DC

## 2012-01-28 NOTE — Telephone Encounter (Signed)
Mr. Zollars called regarding Eileen Hansen's bp.  It is running 170/100 - 180/90.  Should she be seen by someone today,  at the Saturday clinic or with you on Monday?  If Monday, is it ok to work in?   She had some dizziness yesterday.

## 2012-01-28 NOTE — Patient Instructions (Addendum)
Elevated blood pressure - it is high. Stress can be a cause of an acutely elevated blood pressure. Your exam is fine: neurologically ok, eyegrounds are normal, heart sounds are normal.  Plan Continue metoprolol at your present dose - it is slowing your heart rate, as it should, but cannot increase the dose  Increase the lisinopril to 20 mg (2 x 10mg ) once a day  Add HCTZ 12.5 mg once a day  Call on Monday with a report on your blood pressure. Call sooner if you feel bad - [phone].

## 2012-01-28 NOTE — Progress Notes (Signed)
Subjective:    Patient ID: Eileen Hansen, female    DOB: [birth year], 66 y.o.   MRN: [id number]  HPI Mr.s Dazey presents due to high blood pressure, running in the 180s' 90's. She reports that this is just started in the past several days. She has had minimal symptoms: some ataxia, nausea, not feeling well. No chest pain or discomfort, no nosebleeds, no severe headache. Her diet has not changed. She has been under a lot of pressure: several issue with guests and with her husband's condition.   Past Medical History  Diagnosis Date  . Allergic rhinitis   . HTN (hypertension)   . Tachycardia     Episodic   Past Surgical History  Procedure Date  . Appendectomy    Family History  Problem Relation Age of Onset  . Cancer Father   . Colon cancer Neg Hx   . Breast cancer Neg Hx   . Coronary artery disease Neg Hx    History   Social History  . Marital Status: Married    Spouse Name: N/A    Number of Children: N/A  . Years of Education: N/A   Occupational History  . Homemaker    Social History Main Topics  . Smoking status: Never Smoker   . Smokeless tobacco: Not on file  . Alcohol Use: No  . Drug Use: No  . Sexually Active: Not on file   Other Topics Concern  . Not on file   Social History Narrative   University - [place], Masters Degree - counseling, Mater's [employer] counselingMarried '69 - 7 years, divorced; married '[id number] daughter - '72; 2 grandchildrenRegular Exercise -  YES    Current Outpatient Prescriptions on File Prior to Visit  Medication Sig Dispense Refill  . b complex vitamins tablet Take 1 tablet by mouth daily.        . benzonatate (TESSALON PERLES) 100 MG capsule Take 1 capsule (100 mg total) by mouth 3 (three) times daily as needed for cough.  30 capsule  0  . Cholecalciferol 1000 UNITS tablet Take 1,000 Units by mouth daily.        . fish oil-omega-3 fatty acids 1000 MG capsule Take 1 g by mouth daily.        Marland Kitchen lisinopril (PRINIVIL,ZESTRIL) 10 MG tablet Take 1  tablet (10 mg total) by mouth daily.  90 tablet  1  . metoprolol tartrate (LOPRESSOR) 25 MG tablet TAKE 1/2 TABLET BY MOUTH TWICE A DAY  30 tablet  5   Current Facility-Administered Medications on File Prior to Visit  Medication Dose Route Frequency Provider Last Rate Last Dose  . hepatitis A vaccine (HAVRIX) 720 Units  0.5 mL Intramuscular Once Jacques Navy, MD      . pneumococcal 23 valent vaccine (PNU-IMMUNE) injection 0.5 mL  0.5 mL Intramuscular Once Jacques Navy, MD      . Lady Gary (BOOSTRIX) injection 0.5 mL  0.5 mL Intramuscular Once Jacques Navy, MD         Review of Systems System review is negative for any constitutional, cardiac, pulmonary, GI or neuro symptoms or complaints other than as described in the HPI.     Objective:   Physical Exam Filed Vitals:   01/28/12 1306  BP: 174/92  Pulse: 68   BP Readings from Last 3 Encounters:  01/28/12 174/92  05/21/11 142/82  03/29/11 130/72   Gen'l- WNWD woman in no distress HEENT- eyegrounds w/o hemorrhage, normal disk margins/ Cor- 2+ radial, RRR,  no murmurs [place]olm - normal respirations Nuero - non focal       Assessment & Plan:

## 2012-01-28 NOTE — Assessment & Plan Note (Signed)
New increase in BP with minimal symptoms.  Plan Continue metoprolol at your present dose - it is slowing your heart rate, as it should, but cannot increase the dose  Increase the lisinopril to 20 mg (2 x 10mg ) once a day  Add HCTZ 12.5 mg once a day  Call on Monday with a report on your blood pressure. Call sooner if you feel bad - [phone].

## 2012-02-02 ENCOUNTER — Telehealth: Payer: Self-pay

## 2012-02-02 NOTE — Telephone Encounter (Signed)
Pt called to report her BP to MEN - ranging 144/89 - 135/79 yesterday and 122/71 this morning.

## 2012-02-02 NOTE — Telephone Encounter (Signed)
Readings are looking much better. Continue present medications

## 2012-02-21 ENCOUNTER — Other Ambulatory Visit: Payer: Self-pay | Admitting: Internal Medicine

## 2012-02-23 ENCOUNTER — Other Ambulatory Visit: Payer: Self-pay

## 2012-02-23 MED ORDER — LISINOPRIL 10 MG PO TABS: 20.0000 mg | ORAL_TABLET | Freq: Every day | ORAL | Status: DC

## 2012-04-04 ENCOUNTER — Other Ambulatory Visit: Payer: Self-pay | Admitting: Internal Medicine

## 2012-05-02 ENCOUNTER — Other Ambulatory Visit: Payer: Self-pay | Admitting: Internal Medicine

## 2012-05-11 ENCOUNTER — Ambulatory Visit (INDEPENDENT_AMBULATORY_CARE_PROVIDER_SITE_OTHER): Payer: Medicare Other | Admitting: *Deleted

## 2012-05-11 DIAGNOSIS — Z23 Encounter for immunization: Secondary | ICD-10-CM

## 2012-05-15 ENCOUNTER — Ambulatory Visit (INDEPENDENT_AMBULATORY_CARE_PROVIDER_SITE_OTHER): Payer: Medicare Other | Admitting: Internal Medicine

## 2012-05-15 ENCOUNTER — Encounter: Payer: Self-pay | Admitting: Internal Medicine

## 2012-05-15 ENCOUNTER — Ambulatory Visit (INDEPENDENT_AMBULATORY_CARE_PROVIDER_SITE_OTHER)
Admission: RE | Admit: 2012-05-15 | Discharge: 2012-05-15 | Disposition: A | Discharge status: Home / Self Care | Payer: Medicare Other | Source: Ambulatory Visit | Attending: Internal Medicine | Admitting: Internal Medicine

## 2012-05-15 VITALS — BP 122/70 | HR 62 | Temp 97.6°F | Resp 16 | Wt 123.0 lb

## 2012-05-15 DIAGNOSIS — M25549 Pain in joints of unspecified hand: Secondary | ICD-10-CM

## 2012-05-15 DIAGNOSIS — M79602 Pain in left arm: Secondary | ICD-10-CM

## 2012-05-15 DIAGNOSIS — M79609 Pain in unspecified limb: Secondary | ICD-10-CM

## 2012-05-15 DIAGNOSIS — M5030 Other cervical disc degeneration, unspecified cervical region: Secondary | ICD-10-CM

## 2012-05-15 DIAGNOSIS — R7989 Other specified abnormal findings of blood chemistry: Secondary | ICD-10-CM

## 2012-05-15 DIAGNOSIS — I10 Essential (primary) hypertension: Secondary | ICD-10-CM

## 2012-05-15 NOTE — Patient Instructions (Addendum)
Arm pain - concern for possible cervical spine problems either from a disk or from arthritis. Plan - plain x-rays. If there is any abnormality we will move ahead to an MRI scan of the neck.  Shingles vaccine is recommended by the CDC and it is a good idea to have the vaccine.  We will check lab in the AM.

## 2012-05-16 ENCOUNTER — Telehealth: Payer: Self-pay | Admitting: *Deleted

## 2012-05-16 ENCOUNTER — Other Ambulatory Visit (INDEPENDENT_AMBULATORY_CARE_PROVIDER_SITE_OTHER): Payer: Medicare Other

## 2012-05-16 DIAGNOSIS — I10 Essential (primary) hypertension: Secondary | ICD-10-CM

## 2012-05-16 DIAGNOSIS — M5030 Other cervical disc degeneration, unspecified cervical region: Secondary | ICD-10-CM | POA: Insufficient documentation

## 2012-05-16 DIAGNOSIS — M25549 Pain in joints of unspecified hand: Secondary | ICD-10-CM

## 2012-05-16 DIAGNOSIS — R7989 Other specified abnormal findings of blood chemistry: Secondary | ICD-10-CM

## 2012-05-16 LAB — CBC WITH DIFFERENTIAL/PLATELET
Basophils Absolute: 0 10*3/uL (ref 0.0–0.1)
HCT: 41.1 % (ref 36.0–46.0)
Lymphs Abs: 2.1 10*3/uL (ref 0.7–4.0)
Monocytes Absolute: 0.5 10*3/uL (ref 0.1–1.0)
Monocytes Relative: 8.3 % (ref 3.0–12.0)
Platelets: 241 10*3/uL (ref 150.0–400.0)
RDW: 12.5 % (ref 11.5–14.6)

## 2012-05-16 LAB — COMPREHENSIVE METABOLIC PANEL
BUN: 18 mg/dL (ref 6–23)
CO2: 28 mEq/L (ref 19–32)
Calcium: 9.4 mg/dL (ref 8.4–10.5)
Chloride: 103 mEq/L (ref 96–112)
Creatinine, Ser: 0.8 mg/dL (ref 0.4–1.2)
GFR: 78.02 mL/min (ref >60.00)
Glucose, Bld: 160 mg/dL — ABNORMAL HIGH (ref 70–99)

## 2012-05-16 LAB — HEPATIC FUNCTION PANEL
Bilirubin, Direct: 0.1 mg/dL (ref 0.0–0.3)
Total Bilirubin: 0.6 mg/dL (ref 0.3–1.2)

## 2012-05-16 LAB — LIPID PANEL
Cholesterol: 201 mg/dL — ABNORMAL HIGH (ref 0–200)
VLDL: 45.4 mg/dL — ABNORMAL HIGH (ref 0.0–40.0)

## 2012-05-16 LAB — LDL CHOLESTEROL, DIRECT: Direct LDL: 137.5 mg/dL

## 2012-05-16 NOTE — Telephone Encounter (Signed)
Patient not in and could not be reached by phone. Mr. Jeanmarie given message and had already received appt. Concerning MRI scheduled to relay message to Ms [place].

## 2012-05-16 NOTE — Assessment & Plan Note (Signed)
Patient with radicular symptoms left arm. C-spine series: Findings: Advanced multilevel degenerative changes redemonstrated  and slightly progressive from 2008. The alignment is anatomic  although there is slight straightening of the normal cervical  lordosis. Disc space narrowing is most pronounced from C4-C7. 1  mm facet mediated anterolisthesis C7-T1. Neural foraminal  narrowing is most severe from C4-C7 on the left and at C6-7 on the  right. The odontoid is intact. 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  Plan - MRI c-spine.

## 2012-05-16 NOTE — Telephone Encounter (Signed)
Message copied by Elnora Morrison on Tue May 16, 2012  5:17 PM ------      Message from: Jacques Navy      Created: Tue May 16, 2012  5:50 AM       Call patient today (tuesday) - disk disease in the neck. MRI to be scheduled.

## 2012-05-16 NOTE — Progress Notes (Signed)
Subjective:    Patient ID: Eileen Hansen, female    DOB: November 11, 1945, 66 y.o.   MRN: [id number]  HPI Mrs. Eileen Hansen reports that for several years she will have an intermittent sharp, burning, very transient pain at the left scapular region. For the past several weeks she has had pain that radiates down her left are to her hand that is sharp in nature. She has also had some weakness in the left arm - difficult to pick up objects with the left arm, especially if reaching down. She denies any injury, denies any dense paresthesia left UE or hand. No neck pain and no previous h/o neck problems.  Past Medical History  Diagnosis Date  . Allergic rhinitis   . HTN (hypertension)   . Tachycardia     Episodic   Past Surgical History  Procedure Date  . Appendectomy    Family History  Problem Relation Age of Onset  . Cancer Father   . Colon cancer Neg Hx   . Breast cancer Neg Hx   . Coronary artery disease Neg Hx    History   Social History  . Marital Status: Married    Spouse Name: N/A    Number of Children: N/A  . Years of Education: N/A   Occupational History  . Homemaker    Social History Main Topics  . Smoking status: Never Smoker   . Smokeless tobacco: Not on file  . Alcohol Use: No  . Drug Use: No  . Sexually Active: Not on file   Other Topics Concern  . Not on file   Social History Narrative   University - [place], Masters Degree - counseling, Mater's [employer] counselingMarried '69 - 7 years, divorced; married '[id number] daughter - '72; 2 grandchildrenRegular Exercise -  YES    Current Outpatient Prescriptions on File Prior to Visit  Medication Sig Dispense Refill  . b complex vitamins tablet Take 1 tablet by mouth daily.        . Cholecalciferol 1000 UNITS tablet Take 1,000 Units by mouth daily.        . fish oil-omega-3 fatty acids 1000 MG capsule Take 1 g by mouth daily.        . hydrochlorothiazide (MICROZIDE) 12.5 MG capsule TAKE 1 CAPSULE BY MOUTH DAILY  30 capsule  2  .  lisinopril (PRINIVIL,ZESTRIL) 10 MG tablet TAKE 1 TABLET BY MOUTH DAILY.  90 tablet  1  . metoprolol tartrate (LOPRESSOR) 25 MG tablet TAKE 1 TABLET EVERY DAY  90 tablet  1  . benzonatate (TESSALON PERLES) 100 MG capsule Take 1 capsule (100 mg total) by mouth 3 (three) times daily as needed for cough.  30 capsule  0   Current Facility-Administered Medications on File Prior to Visit  Medication Dose Route Frequency Provider Last Rate Last Dose  . hepatitis A vaccine (HAVRIX) 720 Units  0.5 mL Intramuscular Once Jacques Navy, MD      . pneumococcal 23 valent vaccine (PNU-IMMUNE) injection 0.5 mL  0.5 mL Intramuscular Once Jacques Navy, MD      . Lady Gary (BOOSTRIX) injection 0.5 mL  0.5 mL Intramuscular Once Jacques Navy, MD          Review of Systems System review is negative for any constitutional, cardiac, pulmonary, GI or neuro symptoms or complaints other than as described in the HPI.     Objective:   Physical Exam Filed Vitals:   05/15/12 1553  BP: 122/70  Pulse: 62  Temp:  97.6 F (36.4 C)  Resp: 16   gen'l - WNWD woman in no distress HEENT - C&S clear Cor- RRR, good peripheral pulse radial and ulnar LE wrist PUlm - normal respirations Neuro - normal grip strength and normal UE strength proximally and distally; normal DTR at biceps and radial tendons; normal sensation to light-touch, pin-prick and vibration. Full ROM shoulder.  C-spine series: Findings: Advanced multilevel degenerative changes redemonstrated  and slightly progressive from 2008. The alignment is anatomic  although there is slight straightening of the normal cervical  lordosis. Disc space narrowing is most pronounced from C4-C7. 1  mm facet mediated anterolisthesis C7-T1. Neural foraminal  narrowing is most severe from C4-C7 on the left and at C6-7 on the  right. The odontoid is intact. 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        Assessment & Plan:

## 2012-05-18 ENCOUNTER — Ambulatory Visit (HOSPITAL_COMMUNITY)
Admission: RE | Admit: 2012-05-18 | Discharge: 2012-05-18 | Disposition: A | Discharge status: Home / Self Care | Payer: Medicare Other | Source: Ambulatory Visit | Attending: Internal Medicine | Admitting: Internal Medicine

## 2012-05-18 DIAGNOSIS — M79609 Pain in unspecified limb: Secondary | ICD-10-CM | POA: Insufficient documentation

## 2012-05-18 DIAGNOSIS — M79602 Pain in left arm: Secondary | ICD-10-CM

## 2012-07-19 LAB — HM COLONOSCOPY: HM COLON: NORMAL

## 2012-08-05 ENCOUNTER — Other Ambulatory Visit: Payer: Self-pay | Admitting: Internal Medicine

## 2012-08-07 ENCOUNTER — Other Ambulatory Visit: Payer: Self-pay | Admitting: *Deleted

## 2012-08-07 MED ORDER — HYDROCHLOROTHIAZIDE 12.5 MG PO CAPS: 12.5000 mg | ORAL_CAPSULE | ORAL | Status: DC

## 2012-09-12 ENCOUNTER — Other Ambulatory Visit: Payer: Self-pay | Admitting: Internal Medicine

## 2012-10-02 ENCOUNTER — Other Ambulatory Visit: Payer: Self-pay | Admitting: Internal Medicine

## 2012-10-03 ENCOUNTER — Other Ambulatory Visit: Payer: Self-pay

## 2012-10-03 MED ORDER — HYDROCHLOROTHIAZIDE 12.5 MG PO CAPS: 12.5000 mg | ORAL_CAPSULE | ORAL | Status: DC

## 2012-10-17 ENCOUNTER — Other Ambulatory Visit: Payer: Self-pay

## 2012-10-17 MED ORDER — HYDROCHLOROTHIAZIDE 12.5 MG PO CAPS: 12.5000 mg | ORAL_CAPSULE | ORAL | Status: DC

## 2012-11-02 ENCOUNTER — Other Ambulatory Visit: Payer: Self-pay | Admitting: Internal Medicine

## 2012-12-13 ENCOUNTER — Other Ambulatory Visit: Payer: Self-pay | Admitting: Obstetrics and Gynecology

## 2013-01-09 ENCOUNTER — Ambulatory Visit (INDEPENDENT_AMBULATORY_CARE_PROVIDER_SITE_OTHER): Payer: Medicare Other | Admitting: Nurse Practitioner

## 2013-01-09 ENCOUNTER — Encounter: Payer: Self-pay | Admitting: Nurse Practitioner

## 2013-01-09 VITALS — BP 147/87 | HR 64 | Temp 98.0°F | Resp 16 | Ht 60.0 in | Wt 127.0 lb

## 2013-01-09 DIAGNOSIS — L03119 Cellulitis of unspecified part of limb: Secondary | ICD-10-CM

## 2013-01-09 DIAGNOSIS — L03116 Cellulitis of left lower limb: Secondary | ICD-10-CM

## 2013-01-09 MED ORDER — SULFAMETHOXAZOLE-TRIMETHOPRIM 800-160 MG PO TABS: 1.0000 | ORAL_TABLET | Freq: Two times a day (BID) | ORAL | Status: DC

## 2013-01-09 NOTE — Patient Instructions (Signed)
Mark red area with pen. Once you start on antibiotic, redness should get smaller, not bigger.  Use ice pack and benadryl cream to relieve itching. Wash area with mild soap & water twice daily and apply bacitracin. Cover with band-aid. Call for re-evaluation if headache is persistent or gets worse, or you develop unusual joint pain, or red area is not getting smaller.   Cellulitis Cellulitis is an infection of the skin and the tissue beneath it. The infected area is usually red and tender. Cellulitis occurs most often in the arms and lower legs.  CAUSES  Cellulitis is caused by bacteria that enter the skin through cracks or cuts in the skin. The most common types of bacteria that cause cellulitis are Staphylococcus and Streptococcus. SYMPTOMS   Redness and warmth.  Swelling.  Tenderness or pain.  Fever. DIAGNOSIS  Your caregiver can usually determine what is wrong based on a physical exam. Blood tests may also be done. TREATMENT  Treatment usually involves taking an antibiotic medicine. HOME CARE INSTRUCTIONS   Take your antibiotics as directed. Finish them even if you start to feel better.  Keep the infected arm or leg elevated to reduce swelling.  Apply a warm cloth to the affected area up to 4 times per day to relieve pain.  Only take over-the-counter or prescription medicines for pain, discomfort, or fever as directed by your caregiver.  Keep all follow-up appointments as directed by your caregiver. SEEK MEDICAL CARE IF:   You notice red streaks coming from the infected area.  Your red area gets larger or turns dark in color.  Your bone or joint underneath the infected area becomes painful after the skin has healed.  Your infection returns in the same area or another area.  You notice a swollen bump in the infected area.  You develop new symptoms. SEEK IMMEDIATE MEDICAL CARE IF:   You have a fever.  You feel very sleepy.  You develop vomiting or diarrhea.  You  have a general ill feeling (malaise) with muscle aches and pains. MAKE SURE YOU:   Understand these instructions.  Will watch your condition.  Will get help right away if you are not doing well or get worse. Document Released: 04/14/2005 Document Revised: 01/04/2012 Document Reviewed: 09/20/2011 [facility] Patient Information 2014 [place], [place].

## 2013-01-10 NOTE — Progress Notes (Signed)
Subjective:    Eileen Hansen is a 67 y.o. female who presents for evaluation of a possible skin infection located top of L inner thigh. Symptoms include erythema, pruritis, scant yellow drainage at insect bite point of entry. She also mentions that she has had slight headache today. Patient denies chills, and joint pain. Precipitating event: insect bite. Treatment to date has included ice pack relieved itching.   The following portions of the patient's history were reviewed and updated as appropriate: allergies, current medications, past medical history and problem list.  Review of Systems Constitutional: negative for chills, fatigue, fevers and night sweats Eyes: negative for visual disturbance Respiratory: negative for cough, dyspnea on exertion, pleurisy/chest pain and wheezing Cardiovascular: negative for chest pain, chest pressure/discomfort and palpitations Gastrointestinal: negative for abdominal pain, diarrhea and nausea Integument/breast: positive for skin lesion(s) and as described in HPI Musculoskeletal:negative for arthralgias, muscle weakness, myalgias and stiff joints Neurological: positive for headaches, negative for dizziness, gait problems and weakness Allergic/Immunologic: negative for prior similar reactions to insect bites.     Objective:    BP 147/87  Pulse 64  Temp(Src) 98 F (36.7 C) (Oral)  Resp 16  Ht 5' (1.524 m)  Wt 127 lb (57.607 kg)  BMI 24.8 kg/m2  SpO2 96% General appearance: alert, cooperative, appears stated age and no distress Head: Normocephalic, without obvious abnormality, atraumatic Lungs: clear to auscultation bilaterally Heart: regular rate and rhythm, S1, S2 normal, no murmur, click, rub or gallop Skin: discrete macular lesion @ top of L inner thigh: warm, erythematous ovoid shape approx. 2 cm with central scant yellow serous crusting-barely visible; and pale pink circle surrounding area that is approximately 4 cm Neurologic: Cranial nerves:  normal, did not assess visual acuity or sense of smell     Assessment:    Cellulitis of the L thigh .    Plan:    Bactrim prescribed. Agricultural engineer distributed. See pt instructions.

## 2013-02-01 ENCOUNTER — Other Ambulatory Visit: Payer: Self-pay | Admitting: Internal Medicine

## 2013-02-21 ENCOUNTER — Other Ambulatory Visit: Payer: Self-pay

## 2013-03-10 ENCOUNTER — Other Ambulatory Visit: Payer: Self-pay | Admitting: Internal Medicine

## 2013-03-26 ENCOUNTER — Other Ambulatory Visit: Payer: Self-pay | Admitting: Internal Medicine

## 2013-04-10 ENCOUNTER — Encounter: Payer: Self-pay | Admitting: Gastroenterology

## 2013-04-19 ENCOUNTER — Ambulatory Visit (INDEPENDENT_AMBULATORY_CARE_PROVIDER_SITE_OTHER): Payer: Medicare Other | Admitting: Internal Medicine

## 2013-04-19 ENCOUNTER — Ambulatory Visit (INDEPENDENT_AMBULATORY_CARE_PROVIDER_SITE_OTHER): Payer: Medicare (Managed Care)

## 2013-04-19 ENCOUNTER — Encounter: Payer: Self-pay | Admitting: Internal Medicine

## 2013-04-19 VITALS — BP 140/80 | HR 60 | Temp 97.2°F | Ht 60.0 in | Wt 126.0 lb

## 2013-04-19 DIAGNOSIS — M7712 Lateral epicondylitis, left elbow: Secondary | ICD-10-CM | POA: Insufficient documentation

## 2013-04-19 DIAGNOSIS — R7989 Other specified abnormal findings of blood chemistry: Secondary | ICD-10-CM

## 2013-04-19 DIAGNOSIS — Z23 Encounter for immunization: Secondary | ICD-10-CM

## 2013-04-19 DIAGNOSIS — K5900 Constipation, unspecified: Secondary | ICD-10-CM

## 2013-04-19 DIAGNOSIS — K921 Melena: Secondary | ICD-10-CM

## 2013-04-19 DIAGNOSIS — Z0000 Encounter for general adult medical examination without abnormal findings: Secondary | ICD-10-CM

## 2013-04-19 DIAGNOSIS — I10 Essential (primary) hypertension: Secondary | ICD-10-CM

## 2013-04-19 DIAGNOSIS — M7710 Lateral epicondylitis, unspecified elbow: Secondary | ICD-10-CM

## 2013-04-19 DIAGNOSIS — K6289 Other specified diseases of anus and rectum: Secondary | ICD-10-CM

## 2013-04-19 LAB — HEMOGLOBIN AND HEMATOCRIT, BLOOD: Hemoglobin: 14.3 g/dL (ref 12.0–15.0)

## 2013-04-19 NOTE — Assessment & Plan Note (Signed)
Will manage with topical pain salves like icy hot and oral NSAIDs as needed for pain. Also demonstrated exercises to relieve pain with gentle rotation of the elbow and shoulder.

## 2013-04-19 NOTE — Assessment & Plan Note (Signed)
Follow-up with GI regarding recommendations.

## 2013-04-19 NOTE — Assessment & Plan Note (Signed)
Will check a hemoglobin and hematocrit today to ensure there is not chronic blood loss leading to anemia. We also sent the patient out with 3 hemoccult cards, so we can better assess the chronicity of the blood in stool. She will follow-up with Dr. Christella Hartigan in GI.

## 2013-04-19 NOTE — Patient Instructions (Addendum)
Thanks for coming to see me.  The arm pain is lateral epicondylitis and tendonitis -see handout below  Right lower quadrant abdominal pain - do the stool cards so that they are back before you see Dr. Sharene Butters today - results will be posted to MyChart  If you go to [place] let me know so that we can provide you with antibiotic and any other medical supplies you will need.  Tell Scharlene Corn to let me help him if he is not feeling well or having trouble   Lateral Epicondylitis (Tennis Elbow) with Rehab Lateral epicondylitis involves inflammation and pain around the outer portion of the elbow. The pain is caused by inflammation of the tendons in the forearm that bring back (extend) the wrist. Lateral epicondylittis is also called tennis elbow, because it is very common in tennis players. However, it may occur in any individual who extends the wrist repetitively. If lateral epicondylitis is left untreated, it may become a chronic problem. SYMPTOMS   Pain, tenderness, and inflammation on the outer (lateral) side of the elbow.  Pain or weakness with gripping activities.  Pain that increases with wrist twisting motions (playing tennis, using a screwdriver, opening a door or a jar).  Pain with lifting objects, including a coffee cup. CAUSES  Lateral epicondylitis is caused by inflammation of the tendons that extend the wrist. Causes of injury may include:  Repetitive stress and strain on the muscles and tendons that extend the wrist.  Sudden change in activity level or intensity.  Incorrect grip in racquet sports.  Incorrect grip size of racquet (often too large).  Incorrect hitting position or technique (usually backhand, leading with the elbow).  Using a racket that is too heavy. RISK INCREASES WITH:  Sports or occupations that require repetitive and/or strenuous forearm and wrist movements (tennis, squash, racquetball, carpentry).  Poor wrist and forearm strength and  flexibility.  Failure to warm up properly before activity.  Resuming activity before healing, rehabilitation, and conditioning are complete. PREVENTION   Warm up and stretch properly before activity.  Maintain physical fitness:  Strength, flexibility, and endurance.  Cardiovascular fitness.  Wear and use properly fitted equipment.  Learn and use proper technique and have a coach correct improper technique.  Wear a tennis elbow (counterforce) brace. PROGNOSIS  The course of this condition depends on the degree of the injury. If treated properly, acute cases (symptoms lasting less than 4 weeks) are often resolved in 2 to 6 weeks. Chronic (longer lasting cases) often resolve in 3 to 6 months, but may require physical therapy. RELATED COMPLICATIONS   Frequently recurring symptoms, resulting in a chronic problem. Properly treating the problem the first time decreases frequency of recurrence.  Chronic inflammation, scarring tendon degeneration, and partial tendon tear, requiring surgery.  Delayed healing or resolution of symptoms. TREATMENT  Treatment first involves the use of ice and medicine, to reduce pain and inflammation. Strengthening and stretching exercises may help reduce discomfort, if performed regularly. These exercises may be performed at home, if the condition is an acute injury. Chronic cases may require a referral to a physical therapist for evaluation and treatment. Your caregiver may advise a corticosteroid injection, to help reduce inflammation. Rarely, surgery is needed. MEDICATION  If pain medicine is needed, nonsteroidal anti-inflammatory medicines (aspirin and ibuprofen), or other minor pain relievers (acetaminophen), are often advised.  Do not take pain medicine for 7 days before surgery.  Prescription pain relievers may be given, if your caregiver thinks they are needed.  Use only as directed and only as much as you need.  Corticosteroid injections may be  recommended. These injections should be reserved only for the most severe cases, because they can only be given a certain number of times. HEAT AND COLD  Cold treatment (icing) should be applied for 10 to 15 minutes every 2 to 3 hours for inflammation and pain, and immediately after activity that aggravates your symptoms. Use ice packs or an ice massage.  Heat treatment may be used before performing stretching and strengthening activities prescribed by your caregiver, physical therapist, or athletic trainer. Use a heat pack or a warm water soak. SEEK MEDICAL CARE IF: Symptoms get worse or do not improve in 2 weeks, despite treatment. EXERCISES  RANGE OF MOTION (ROM) AND STRETCHING EXERCISES - Epicondylitis, Lateral (Tennis Elbow) These exercises may help you when beginning to rehabilitate your injury. Your symptoms may go away with or without further involvement from your physician, physical therapist or athletic trainer. While completing these exercises, remember:   Restoring tissue flexibility helps normal motion to return to the joints. This allows healthier, less painful movement and activity.  An effective stretch should be held for at least 30 seconds.  A stretch should never be painful. You should only feel a gentle lengthening or release in the stretched tissue. RANGE OF MOTION  Wrist Flexion, Active-Assisted  Extend your right / left elbow with your fingers pointing down.*  Gently pull the back of your hand towards you, until you feel a gentle stretch on the top of your forearm.  Hold this position for __________ seconds. Repeat __________ times. Complete this exercise __________ times per day.  *If directed by your physician, physical therapist or athletic trainer, complete this stretch with your elbow bent, rather than extended. RANGE OF MOTION  Wrist Extension, Active-Assisted  Extend your right / left elbow and turn your palm upwards.*  Gently pull your palm and fingertips  back, so your wrist extends and your fingers point more toward the ground.  You should feel a gentle stretch on the inside of your forearm.  Hold this position for __________ seconds. Repeat __________ times. Complete this exercise __________ times per day. *If directed by your physician, physical therapist or athletic trainer, complete this stretch with your elbow bent, rather than extended. STRETCH - Wrist Flexion  Place the back of your right / left hand on a tabletop, leaving your elbow slightly bent. Your fingers should point away from your body.  Gently press the back of your hand down onto the table by straightening your elbow. You should feel a stretch on the top of your forearm.  Hold this position for __________ seconds. Repeat __________ times. Complete this stretch __________ times per day.  STRETCH  Wrist Extension   Place your right / left fingertips on a tabletop, leaving your elbow slightly bent. Your fingers should point backwards.  Gently press your fingers and palm down onto the table by straightening your elbow. You should feel a stretch on the inside of your forearm.  Hold this position for __________ seconds. Repeat __________ times. Complete this stretch __________ times per day.  STRENGTHENING EXERCISES - Epicondylitis, Lateral (Tennis Elbow) These exercises may help you when beginning to rehabilitate your injury. They may resolve your symptoms with or without further involvement from your physician, physical therapist or athletic trainer. While completing these exercises, remember:   Muscles can gain both the endurance and the strength needed for everyday activities through controlled exercises.  Complete  these exercises as instructed by your physician, physical therapist or athletic trainer. Increase the resistance and repetitions only as guided.  You may experience muscle soreness or fatigue, but the pain or discomfort you are trying to eliminate should never  worsen during these exercises. If this pain does get worse, stop and make sure you are following the directions exactly. If the pain is still present after adjustments, discontinue the exercise until you can discuss the trouble with your caregiver. STRENGTH Wrist Flexors  Sit with your right / left forearm palm-up and fully supported on a table or countertop. Your elbow should be resting below the height of your shoulder. Allow your wrist to extend over the edge of the surface.  Loosely holding a __________ weight, or a piece of rubber exercise band or tubing, slowly curl your hand up toward your forearm.  Hold this position for __________ seconds. Slowly lower the wrist back to the starting position in a controlled manner. Repeat __________ times. Complete this exercise __________ times per day.  STRENGTH  Wrist Extensors  Sit with your right / left forearm palm-down and fully supported on a table or countertop. Your elbow should be resting below the height of your shoulder. Allow your wrist to extend over the edge of the surface.  Loosely holding a __________ weight, or a piece of rubber exercise band or tubing, slowly curl your hand up toward your forearm.  Hold this position for __________ seconds. Slowly lower the wrist back to the starting position in a controlled manner. Repeat __________ times. Complete this exercise __________ times per day.  STRENGTH - Ulnar Deviators  Stand with a ____________________ weight in your right / left hand, or sit while holding a rubber exercise band or tubing, with your healthy arm supported on a table or countertop.  Move your wrist, so that your pinkie travels toward your forearm and your thumb moves away from your forearm.  Hold this position for __________ seconds and then slowly lower the wrist back to the starting position. Repeat __________ times. Complete this exercise __________ times per day STRENGTH - Radial Deviators  Stand with a  ____________________ weight in your right / left hand, or sit while holding a rubber exercise band or tubing, with your injured arm supported on a table or countertop.  Raise your hand upward in front of you or pull up on the rubber tubing.  Hold this position for __________ seconds and then slowly lower the wrist back to the starting position. Repeat __________ times. Complete this exercise __________ times per day. STRENGTH  Forearm Supinators   Sit with your right / left forearm supported on a table, keeping your elbow below shoulder height. Rest your hand over the edge, palm down.  Gently grip a hammer or a soup ladle.  Without moving your elbow, slowly turn your palm and hand upward to a "thumbs-up" position.  Hold this position for __________ seconds. Slowly return to the starting position. Repeat __________ times. Complete this exercise __________ times per day.  STRENGTH  Forearm Pronators   Sit with your right / left forearm supported on a table, keeping your elbow below shoulder height. Rest your hand over the edge, palm up.  Gently grip a hammer or a soup ladle.  Without moving your elbow, slowly turn your palm and hand upward to a "thumbs-up" position.  Hold this position for __________ seconds. Slowly return to the starting position. Repeat __________ times. Complete this exercise __________ times per day.  STRENGTH -  Grip  Grasp a tennis ball, a dense sponge, or a large, rolled sock in your hand.  Squeeze as hard as you can, without increasing any pain.  Hold this position for __________ seconds. Release your grip slowly. Repeat __________ times. Complete this exercise __________ times per day.  STRENGTH - Elbow Extensors, Isometric  Stand or sit upright, on a firm surface. Place your right / left arm so that your palm faces your stomach, and it is at the height of your waist.  Place your opposite hand on the underside of your forearm. Gently push up as your right  / left arm resists. Push as hard as you can with both arms, without causing any pain or movement at your right / left elbow. Hold this stationary position for __________ seconds. Gradually release the tension in both arms. Allow your muscles to relax completely before repeating. Document Released: 07/05/2005 Document Revised: 09/27/2011 Document Reviewed: 10/17/2008 [facility] Patient Information 2014 [place], [place].

## 2013-04-19 NOTE — Assessment & Plan Note (Addendum)
Continue current management with lisinopril, HCTZ, and metoprolol. We will recheck CMP and liver function tests to ensure there is no renal dysfunction, electrolyte abnormalities, or liver dysfunction with regards to HTN and as adverse effects of the current medications.

## 2013-04-19 NOTE — Progress Notes (Signed)
Subjective:     Patient ID: Eileen Hansen, female   DOB: [birth year], 67 y.o.   MRN: [id number]  HPI The patient is here for annual Medicare wellness examination and management of other chronic and acute problems, including HTN, allergic rhinitis, and rectal pain. She presents with a sharp pain in her left forearm. It is intermittent and occurs once every 2-3 days for about 1-2 hours. It is not exacerbated or brought on by any movement. She rubs her arm with a menthol salve, which may improve the pain some.    The risk factors are reflected in the social history.  The roster of all physicians providing medical care to patient - is listed in the Snapshot section of the chart.  Activities of daily living:  The patient is 100% inedpendent in all ADLs: dressing, toileting, feeding as well as independent mobility.   Home safety : The patient has smoke detectors in the home. They wear seatbelts. Firearms in the home are kept loaded but are kept out of reach of grandchildren who visit. One is in a locked box, but the rifle is not. There is no violence in the home. Lives at home with husband. Husband does yard work, but has some limitations due to his health. He has recently had decreased energy and exhibits some depressive symptoms. Patient is the primary caretaker for him, but doesn't feel overwhelmed. Knows how to get help if she needs it with him. 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   There is no risks for hepatitis, STDs or HIV. There is no   history of blood transfusion. Immigrated from [place], and plans to travel back there again in the future. Recalls having normal childhood vaccines. Never had a blood transfusion. Patient isn't concerned about potential exposure to hepatitis or HIV.   The patient has seen their dentist in the last six months. They have seen their eye doctor, Dr. Hyacinth Meeker in the last year. She will follow-up with Dr. Hyacinth Meeker in about 2 months. They deny any hearing difficulty and have  not had audiologic testing in the last year. Patient spends a lot of time in the sun and does not wear sunscreen. Discussed the need for sun protection: hats, long sleeves and use of sunscreen if there is significant sun exposure.   Diet: the importance of a healthy diet is discussed. Diet includes a lot of fruits and vegetables.   The patient does not have a regular exercise program, but tries to stay active by weeding the garden and participating activities around the house. The benefits of regular aerobic exercise were discussed.  Depression screen: there are no signs or vegative symptoms of depression- irritability, change in appetite, anhedonia, sadness/tearfullness.  Cognitive assessment: the patient manages all their financial and personal affairs and is actively engaged. The patient does admit some memory loss. She tries to consciously remember things from the past.They could relate day,date,year and events; recalled 3/3 objects at 3 minutes; performed clock-face test normally.   The following portions of the patient's history were reviewed and updated as appropriate: allergies, current medications, past family history, past medical history,  past surgical history, past social history  and problem list.  Vision, hearing, body mass index were assessed and reviewed.   During the course of the visit the patient was educated and counseled about appropriate screening and preventive services including : fall prevention , diabetes screening, nutrition counseling, colorectal cancer screening, and recommended immunizations. Last colonoscopy was 2006, but had a sigmoidoscopy in  2010 with Dr. Christella Hartigan after complaining about pain in rectum. She will Dr. Christella Hartigan again this month for follow-up regarding rectal bleeding noticed at a OBGYN exam. Gyncologist is Dr. Henderson Cloud, who manages gyncologic exams and mammograms. Last mammogram was in May 2014.   Past Medical History  Diagnosis Date  . Allergic rhinitis    . HTN (hypertension)   . Tachycardia     Episodic   Past Surgical History  Procedure Laterality Date  . Appendectomy     Family History  Problem Relation Age of Onset  . Cancer Father   . Colon cancer Neg Hx   . Breast cancer Neg Hx   . Coronary artery disease Neg Hx    History   Social History  . Marital Status: Married    Spouse Name: N/A    Number of Children: N/A  . Years of Education: N/A   Occupational History  . Homemaker    Social History Main Topics  . Smoking status: Never Smoker   . Smokeless tobacco: Not on file  . Alcohol Use: No  . Drug Use: No  . Sexual Activity: Not on file   Other Topics Concern  . Not on file   Social History Narrative   University - [place], Masters Degree - counseling, [employer]   Married '69 - 7 years, divorced; married '82   21 daughter - '72; 2 grandchildren      Regular Exercise -  YES            Review of Systems Constitutional:  Negative for fever, chills, activity change and unexpected weight change.  HEENT:  Negative for hearing loss, ear pain, congestion, neck stiffness. Negative for swallowing problems. At night has thick secretions that sometimes makes her feel like she couldn't breathe--for the past 3-4 months. Hot tea may improve symptoms.  Negative for dental complaints.  No sinus pressure or feelings of ear fullness.  Eyes: Negative for vision loss or change in visual acuity.  Respiratory: Negative for chest tightness and wheezing. Negative for DOE.  Coughing of thick mucousy secretions most nights.  Cardiovascular: Negative for chest pain or palpitations. No decreased exercise tolerance Gastrointestinal: No change in bowel habit. Occasional constipation, managed fine with home remedies including pineapple juice. Sees no blood in her stool, but OBGYN diagnosed her with blood in stool, unsure of why this is happening. Will see Dr. Christella Hartigan with GI on 05/14/13. No bloating or gas. No reflux or  indigestion Genitourinary: Negative for urgency, frequency, flank pain and difficulty urinating.  Musculoskeletal: Occasional myalgia in left forearm, and some tenderness in joints of left hand. This joint tenderness isn't present in the morning and is worst at night. No swelling or redness in extremities. Negative for back pain and gait problem.  Neurological: Occasional dizziness, positional like getting up from bed or turning very quickly. Negative for tremors, weakness and headaches.  Hematological: Negative for adenopathy.  Psychiatric/Behavioral: Negative for behavioral problems and dysphoric mood.      Objective:   Physical Exam Filed Vitals:   04/19/13 1401  BP: 140/80  Pulse: 60  Temp: 97.2 F (36.2 C)   Wt Readings from Last 3 Encounters:  04/19/13 126 lb (57.153 kg)  01/09/13 127 lb (57.607 kg)  05/15/12 123 lb (55.792 kg)   Gen'l: well nourished, well developed     Woman in no distress HEENT - [place]/AT, EACs/TMs normal, oropharynx with native dentition in good condition, no buccal or palatal lesions, posterior pharynx clear,  mucous membranes moist. C&S clear, PERRLA, fundi - normal Neck - supple, no thyromegaly. Nodes- negative submental, cervical, supraclavicular regions Chest - no deformity. Breast - deferred to gyn Lungs - clear without rales, wheezes. No increased work of breathing Cardiovascular - regular rate and rhythm, quiet precordium, no murmurs, rubs or gallops, 2+ radial and dorsalis pedis pulses.  Abdomen - BS+ x 4, no HSM, RLQ pain to deep palpation just medial to McBurney's point. No rebound or guarding.  Pelvic - deferred to gyn Rectal - deferred to gyn Extremities -Pain on supination and pronation of left arm. Point tenderness over lateral epicondyle. No clubbing, cyanosis, edema or deformity. Symmetric intact sensation in lower extremities.  Neuro - A&O x 3, CN II-XII normal, motor strength normal and equal, DTRs 2+ and symmetrical biceps and patellar  tendons. Cerebellar - no tremor, no rigidity, fluid movement and normal gait. Derm - Head, neck, back, abdomen and extremities without suspicious lesions  Current outpatient prescriptions:aspirin 81 MG tablet, Take 81 mg by mouth daily., Disp: , Rfl: ;  Coenzyme Q10 (CO Q 10 PO), Take 300 mg by mouth daily., Disp: , Rfl: ;  Glucosamine HCl-MSM 750-750 MG TABS, Take 2 tablets by mouth daily. 1500 mg daily, Disp: , Rfl: ;  b complex vitamins tablet, Take 1 tablet by mouth daily.  , Disp: , Rfl: ;  Cholecalciferol 1000 UNITS tablet, Take 1,000 Units by mouth daily.  , Disp: , Rfl:  fish oil-omega-3 fatty acids 1000 MG capsule, Take 1 g by mouth daily.  , Disp: , Rfl: ;  hydrochlorothiazide (MICROZIDE) 12.5 MG capsule, TAKE 1 CAPSULE (12.5 MG TOTAL) BY MOUTH EVERY MORNING., Disp: 90 capsule, Rfl: 0;  lisinopril (PRINIVIL,ZESTRIL) 10 MG tablet, TAKE 2 TABLETS BY MOUTH ONCE A DAY, Disp: 180 tablet, Rfl: 1;  metoprolol tartrate (LOPRESSOR) 25 MG tablet, TAKE 1 TABLET BY MOUTH ONCE DAILY, Disp: 90 tablet, Rfl: 1 sulfamethoxazole-trimethoprim (BACTRIM DS,SEPTRA DS) 800-160 MG per tablet, Take 1 tablet by mouth every 12 (twelve) hours., Disp: 10 tablet, Rfl: 0 Current facility-administered medications:hepatitis A vaccine (HAVRIX) 720 Units, 0.5 mL, Intramuscular, Once, Jacques Navy, MD;  pneumococcal 23 valent vaccine (PNU-IMMUNE) injection 0.5 mL, 0.5 mL, Intramuscular, Once, Jacques Navy, MD;  TDaP (BOOSTRIX) injection 0.5 mL, 0.5 mL, Intramuscular, Once, Jacques Navy, MD     Assessment:     Ms. Berkland is a 67 year old woman with a history of HTN, allergic rhinitis, rectal pain, joint pain, hyperlipidemia, and constipation who presents for management of her chronic conditions as well as with recent onset sharp left arm pain consistent with lateral epicondylitis and a recent history of blood in stool with RLQ pain. Her history of recent blood in stool per stool sample at her GYN but with no observed  blood in stool could be consistent with diverticular bleed, internal hemorrhoids, or possible occult GI malignancy. Her history of intermittent constipation could be consistent with diverticulosis or internal hemorrhoids as an etiology. She has no recent weight loss or fatigue to support a diagnosis of GI malignancy. Had a recent GYN visit with transvaginal US in May of 2014 which showed no abnormalities of the ovaries that could be contributory.     Plan:     See plan by problem list.

## 2013-04-19 NOTE — Assessment & Plan Note (Addendum)
Interval history significant for heme positive stool w/o frank hematochezia and lateral epicondylitis. Physical exam, sans breast and pelvic, is normal except for tenderness at the right lateral epicondyle. Lab is notable for elevated LDL cholesterol. She is current with colorectal cancer screening and is scheduled to see GI for follow up of heme positive stools. She is current with Gyn and breast cancer screening. She is due for Tetanus, pneumonia vaccine and shingles vaccine.  Eileen Hansen is contemplating a trip home to [place], although there is a lot of unrest and some danger in the country. Should she travel will provide medicines including a broad-spectrum antibiotic, anti-diarrheal agent, Rx NSADISs, etc.  In summary - a very nice woman who appears medically stable but needs to address elevated LDL cholesterol.

## 2013-04-20 LAB — HEPATIC FUNCTION PANEL
ALT: 30 U/L (ref 0–35)
AST: 28 U/L (ref 0–37)
Albumin: 4.6 g/dL (ref 3.5–5.2)
Bilirubin, Direct: 0.1 mg/dL (ref 0.0–0.3)
Total Bilirubin: 0.7 mg/dL (ref 0.3–1.2)

## 2013-04-20 LAB — LDL CHOLESTEROL, DIRECT: Direct LDL: 154.5 mg/dL

## 2013-04-20 LAB — COMPREHENSIVE METABOLIC PANEL
AST: 28 U/L (ref 0–37)
Albumin: 4.6 g/dL (ref 3.5–5.2)
Alkaline Phosphatase: 54 U/L (ref 39–117)
BUN: 18 mg/dL (ref 6–23)
CO2: 27 mEq/L (ref 19–32)
Calcium: 9.8 mg/dL (ref 8.4–10.5)
Chloride: 101 mEq/L (ref 96–112)
GFR: 77.8 mL/min (ref >60.00)
Glucose, Bld: 92 mg/dL (ref 70–99)
Potassium: 4.1 mEq/L (ref 3.5–5.1)
Total Bilirubin: 0.7 mg/dL (ref 0.3–1.2)
Total Protein: 7.6 g/dL (ref 6.0–8.3)

## 2013-04-20 LAB — LIPID PANEL
Cholesterol: 230 mg/dL — ABNORMAL HIGH (ref 0–200)
Total CHOL/HDL Ratio: 7
Triglycerides: 262 mg/dL — ABNORMAL HIGH (ref 0.0–149.0)

## 2013-04-23 NOTE — Assessment & Plan Note (Signed)
Well managed with home remedies. No medical intervention required

## 2013-04-23 NOTE — Assessment & Plan Note (Signed)
Elevated LDL cholesterol at 154.   Plan Trial of improved diet and exercise vs medical therapy with a statin drug. Patient notified by MyChart of results and options, response pending.

## 2013-05-01 ENCOUNTER — Other Ambulatory Visit: Payer: Self-pay | Admitting: Internal Medicine

## 2013-05-01 ENCOUNTER — Encounter: Payer: Self-pay | Admitting: Internal Medicine

## 2013-05-01 ENCOUNTER — Ambulatory Visit (INDEPENDENT_AMBULATORY_CARE_PROVIDER_SITE_OTHER): Payer: Medicare (Managed Care) | Admitting: Internal Medicine

## 2013-05-01 VITALS — BP 120/70 | HR 65 | Temp 98.9°F | Wt 126.8 lb

## 2013-05-01 DIAGNOSIS — K921 Melena: Secondary | ICD-10-CM

## 2013-05-01 DIAGNOSIS — I10 Essential (primary) hypertension: Secondary | ICD-10-CM

## 2013-05-01 DIAGNOSIS — E785 Hyperlipidemia, unspecified: Secondary | ICD-10-CM

## 2013-05-01 DIAGNOSIS — J019 Acute sinusitis, unspecified: Secondary | ICD-10-CM

## 2013-05-01 DIAGNOSIS — Z0000 Encounter for general adult medical examination without abnormal findings: Secondary | ICD-10-CM

## 2013-05-01 NOTE — Assessment & Plan Note (Signed)
Presentation with 4 day history of congestion, malaise, decreased appetite, non-productive cough, chest tightness, sore throat, and sinus pressure. No alarming symptoms of high fever, night sweats or physical findings of pulmonary involvement. Given the length of the illness, most likely a viral sinusitis.   Plan:  Symptomatic care with tylenol, a cough syrup with dextromethorphan or guaifenesin, stovetop vaporizer, and flonase as needed. Instructed to avoid decongestants and to send a message for worsening symptoms or failure to improve within 6-7 days.

## 2013-05-01 NOTE — Assessment & Plan Note (Addendum)
With regards to her cholesterol, she does not want to start a medicine. I have used a decision aid to share decision making with the patient about interventions to reduce the risk of coronary events. Using the Framingham risk score, we estimated the patient's 10-year of atherosclerotic events at 14% and discussed how this risk could be reduced with the addition of a standard-dose statin to 10%.  After considering the patient's unique circumstances and the pros and cons of the alternatives, we have decided to try initiating a regular exercise program prior to starting a statin. Eileen Hansen plans to walk for 30 minutes daily for exercise.  We also discussed ideal body weight, and recommended a goal weight of 120 pounds.

## 2013-05-01 NOTE — Assessment & Plan Note (Signed)
Continue current plan of care.

## 2013-05-01 NOTE — Patient Instructions (Signed)
For acute sinusitis:  Plan:  Take a cough syrup such as robitussin DM (or any dextromethorphan or guanfencine containing syrup) for cough. Avoid any products containing a decongestant such as pseudoephedrine or phenylephrine.  Use a stove-top vaporizer with hot water and menthol for congestion.  You can use tylenol 3 times a day as needed for feeling feverish or having muscle aches.   If your symptoms worsen significantly (increasing facial pain or fever spikes) or if your symptoms don't improve within the next 6-7 days, send [place] a mychart message and we will prescribe antibiotics for likely bacterial infection.   Exercise! Walking for 30 minutes a day or more will help your cholesterol and reduce you risk of heart attack.     Sinusitis Sinusitis is redness, soreness, and swelling (inflammation) of the paranasal sinuses. Paranasal sinuses are air pockets within the bones of your face (beneath the eyes, the middle of the forehead, or above the eyes). In healthy paranasal sinuses, mucus is able to drain out, and air is able to circulate through them by way of your nose. However, when your paranasal sinuses are inflamed, mucus and air can become trapped. This can allow bacteria and other germs to grow and cause infection. Sinusitis can develop quickly and last only a short time (acute) or continue over a long period (chronic). Sinusitis that lasts for more than 12 weeks is considered chronic.  CAUSES  Causes of sinusitis include:  Allergies.  Structural abnormalities, such as displacement of the cartilage that separates your nostrils (deviated septum), which can decrease the air flow through your nose and sinuses and affect sinus drainage.  Functional abnormalities, such as when the small hairs (cilia) that line your sinuses and help remove mucus do not work properly or are not present. SYMPTOMS  Symptoms of acute and chronic sinusitis are the same. The primary symptoms are pain and pressure  around the affected sinuses. Other symptoms include:  Upper toothache.  Earache.  Headache.  Bad breath.  Decreased sense of smell and taste.  A cough, which worsens when you are lying flat.  Fatigue.  Fever.  Thick drainage from your nose, which often is green and may contain pus (purulent).  Swelling and warmth over the affected sinuses. DIAGNOSIS  Your caregiver will perform a physical exam. During the exam, your caregiver may:  Look in your nose for signs of abnormal growths in your nostrils (nasal polyps).  Tap over the affected sinus to check for signs of infection.  View the inside of your sinuses (endoscopy) with a special imaging device with a light attached (endoscope), which is inserted into your sinuses. If your caregiver suspects that you have chronic sinusitis, one or more of the following tests may be recommended:  Allergy tests.  Nasal culture A sample of mucus is taken from your nose and sent to a lab and screened for bacteria.  Nasal cytology A sample of mucus is taken from your nose and examined by your caregiver to determine if your sinusitis is related to an allergy. TREATMENT  Most cases of acute sinusitis are related to a viral infection and will resolve on their own within 10 days. Sometimes medicines are prescribed to help relieve symptoms (pain medicine, decongestants, nasal steroid sprays, or saline sprays).  However, for sinusitis related to a bacterial infection, your caregiver will prescribe antibiotic medicines. These are medicines that will help kill the bacteria causing the infection.  Rarely, sinusitis is caused by a fungal infection. In theses cases, your  caregiver will prescribe antifungal medicine. For some cases of chronic sinusitis, surgery is needed. Generally, these are cases in which sinusitis recurs more than 3 times per year, despite other treatments. HOME CARE INSTRUCTIONS   Drink plenty of water. Water helps thin the mucus so  your sinuses can drain more easily.  Use a humidifier.  Inhale steam 3 to 4 times a day (for example, sit in the bathroom with the shower running).  Apply a warm, moist washcloth to your face 3 to 4 times a day, or as directed by your caregiver.  Use saline nasal sprays to help moisten and clean your sinuses.  Take over-the-counter or prescription medicines for pain, discomfort, or fever only as directed by your caregiver. SEEK IMMEDIATE MEDICAL CARE IF:  You have increasing pain or severe headaches.  You have nausea, vomiting, or drowsiness.  You have swelling around your face.  You have vision problems.  You have a stiff neck.  You have difficulty breathing. MAKE SURE YOU:   Understand these instructions.  Will watch your condition.  Will get help right away if you are not doing well or get worse. Document Released: 07/05/2005 Document Revised: 09/27/2011 Document Reviewed: 07/20/2011 [facility] Patient Information 2014 [place], [place].

## 2013-05-01 NOTE — Assessment & Plan Note (Signed)
04/19/13: This report of hematochezia, along with her presentation of RLQ tenderness to deep palpation and occasional constipation could be consistent with diverticulitis, internal hemorrhoids or GI malignancy. She also has a history of intermittent rectal pain which is consistent with her past diagnosis of internal hemorrhoids. She has no report of rectal pain currently. She has not visualized any blood in her stool.    With regards to the hematochezia, she sent in her hemoccult cards, and will follow up with Dr. Christella Hartigan on 27th.

## 2013-05-01 NOTE — Progress Notes (Signed)
Subjective:     Patient ID: Eileen Hansen, female   DOB: 1945-08-09, 67 y.o.   MRN: [id number]  HPI On 10/11, she noticed a nosebleed. Then that night, she started feeling some chest tightness, congestion, sore throat, hoarseness, sinus pressure, malaise. She has a non-productive cough that is predominant at night. The coughing keeps her up at night. In the morning, she spits up a thick, green mucus. She also feels dizziness on exertion--feels like room is spinning and like she might pass out. She feels more off- balance when getting up to go to the bathroom at night. No ear pain or fullness. No HA. No myalgias. No SOB. She has tried no medicines for this yet. No sick contacts.   She is concerned about being contagious because she plans on seeing her 2 granddaughters.   Review of Systems General: No fevers, chills or night sweats.  CV: No chest pain.  Abdominal: No N/V/D. Loss of appetite.  Neuro: No changes in vision.     Objective:   Physical Exam Filed Vitals:   05/01/13 1620  BP: 120/70  Pulse: 65  Temp: 98.9 F (37.2 C)   General: Well appearing woman sitting in the exam room in NAD.  CV: RRR Pulm: CTAB, no wheezes or crackles.  HEENT: TMs clear bilaterally. No erythema or exudates in oropharynx. Erythema of the nasal turbinates. Tenderness to palpation of the right frontal and left maxillary sinuses. Conjunctiva and sclera clear. No LAD.   Current Outpatient Prescriptions on File Prior to Visit  Medication Sig Dispense Refill  . aspirin 81 MG tablet Take 81 mg by mouth daily.      Marland Kitchen b complex vitamins tablet Take 1 tablet by mouth daily.        . Cholecalciferol 1000 UNITS tablet Take 1,000 Units by mouth daily.        . Coenzyme Q10 (CO Q 10 PO) Take 300 mg by mouth daily.      . fish oil-omega-3 fatty acids 1000 MG capsule Take 1 g by mouth daily.        . Glucosamine HCl-MSM 750-750 MG TABS Take 2 tablets by mouth daily. 1500 mg daily      . lisinopril (PRINIVIL,ZESTRIL) 10  MG tablet TAKE 2 TABLETS BY MOUTH ONCE A DAY  180 tablet  1  . metoprolol tartrate (LOPRESSOR) 25 MG tablet TAKE 1 TABLET BY MOUTH ONCE DAILY  90 tablet  1  . sulfamethoxazole-trimethoprim (BACTRIM DS,SEPTRA DS) 800-160 MG per tablet Take 1 tablet by mouth every 12 (twelve) hours.  10 tablet  0   Current Facility-Administered Medications on File Prior to Visit  Medication Dose Route Frequency Provider Last Rate Last Dose  . hepatitis A vaccine (HAVRIX) 720 Units  0.5 mL Intramuscular Once Jacques Navy, MD      . pneumococcal 23 valent vaccine (PNU-IMMUNE) injection 0.5 mL  0.5 mL Intramuscular Once Jacques Navy, MD      . Lady Gary Leda Min) injection 0.5 mL  0.5 mL Intramuscular Once Jacques Navy, MD          Assessment:     Eileen Hansen is a 67 year old woman with a history of HTN, allergic rhinitis, hematochezia, rectal pain, lateral epicondylitis, and hyperlipidemia who presents with a 4 day history of sinus pressure, congestion, fatigue, sore throat, and night-time coughing with chest tightness. On physical exam, there is no evidence of pulmonary involvement, and the HEENT exam is benign beyond mild tenderness to  palpation of the right frontal and left maxillary sinus. Most likely etiology is acute sinusitis.     Plan:     See plan by problem list.

## 2013-05-02 ENCOUNTER — Other Ambulatory Visit: Payer: Self-pay | Admitting: Internal Medicine

## 2013-05-02 ENCOUNTER — Other Ambulatory Visit (INDEPENDENT_AMBULATORY_CARE_PROVIDER_SITE_OTHER): Payer: Medicare (Managed Care)

## 2013-05-02 DIAGNOSIS — K921 Melena: Secondary | ICD-10-CM

## 2013-05-03 ENCOUNTER — Encounter: Payer: Self-pay | Admitting: Internal Medicine

## 2013-05-04 MED ORDER — SULFAMETHOXAZOLE-TMP DS 800-160 MG PO TABS: 1.0000 | ORAL_TABLET | Freq: Two times a day (BID) | ORAL | Status: DC

## 2013-05-07 LAB — HEMOCCULT SLIDES (X 3 CARDS)
Fecal Occult Blood: NEGATIVE
OCCULT 1: NEGATIVE
OCCULT 2: NEGATIVE
OCCULT 3: NEGATIVE
OCCULT 4: NEGATIVE
OCCULT 5: NEGATIVE

## 2013-05-14 ENCOUNTER — Ambulatory Visit (INDEPENDENT_AMBULATORY_CARE_PROVIDER_SITE_OTHER): Payer: Medicare Other | Admitting: Gastroenterology

## 2013-05-14 ENCOUNTER — Encounter: Payer: Self-pay | Admitting: Gastroenterology

## 2013-05-14 VITALS — BP 128/70 | HR 68 | Ht 59.0 in | Wt 126.8 lb

## 2013-05-14 DIAGNOSIS — K921 Melena: Secondary | ICD-10-CM

## 2013-05-14 MED ORDER — MOVIPREP 100 G PO SOLR: 1.0000 | Freq: Once | ORAL | Status: DC

## 2013-05-14 NOTE — Patient Instructions (Signed)
Please start taking citrucel (orange flavored) powder fiber supplement.  This may cause some bloating at first but that usually goes away. Begin with a small spoonful and work your way up to a large, heaping spoonful daily over a week. You will be set up for a colonoscopy for fobt positive stool (LEC, moderate sedation).

## 2013-05-14 NOTE — Progress Notes (Signed)
Review of pertinent gastrointestinal problems: 1. Colonoscopy Dr. Loreta Ave 2006; no polyps or cancers 2. Rectal discomfort led to flex sig, Dr. Christella Hartigan 2011; found hemorrhoids only.  Pelvic MRI was essentially normal at the time.  HPI: This is a very pleasant 67 year old woman whom I last saw about 3-1/2 years ago.  FOB + at gyne office last month FOB negative at PCP this month  She still has rectal discomforts.  Constant discomfort, towards anal area.  She never sees blood in her stool.  She had a nose bleed about 3 weeks, after her gyne office appt.  Constipated at times, goes about every 3 days.     Past Medical History  Diagnosis Date  . Allergic rhinitis   . HTN (hypertension)   . Tachycardia     Episodic    Past Surgical History  Procedure Laterality Date  . Appendectomy    . Bunionectomy      Current Outpatient Prescriptions  Medication Sig Dispense Refill  . aspirin 81 MG tablet Take 81 mg by mouth daily.      Marland Kitchen b complex vitamins tablet Take 1 tablet by mouth daily.        . Cholecalciferol 1000 UNITS tablet Take 1,000 Units by mouth daily.        . Coenzyme Q10 (CO Q 10 PO) Take 300 mg by mouth daily.      . fish oil-omega-3 fatty acids 1000 MG capsule Take 1 g by mouth daily.        . Glucosamine HCl-MSM 750-750 MG TABS Take 2 tablets by mouth daily. 1500 mg daily      . hydrochlorothiazide (MICROZIDE) 12.5 MG capsule TAKE 1 CAPSULE (12.5 MG TOTAL) BY MOUTH EVERY MORNING.  90 capsule  3  . lisinopril (PRINIVIL,ZESTRIL) 10 MG tablet TAKE 2 TABLETS BY MOUTH ONCE A DAY  180 tablet  1  . metoprolol tartrate (LOPRESSOR) 25 MG tablet TAKE 1 TABLET BY MOUTH ONCE DAILY  90 tablet  1   Current Facility-Administered Medications  Medication Dose Route Frequency Provider Last Rate Last Dose  . hepatitis A vaccine (HAVRIX) 720 Units  0.5 mL Intramuscular Once Jacques Navy, MD      . pneumococcal 23 valent vaccine (PNU-IMMUNE) injection 0.5 mL  0.5 mL Intramuscular Once  Jacques Navy, MD      . Lady Gary Leda Min) injection 0.5 mL  0.5 mL Intramuscular Once Jacques Navy, MD        Allergies as of 05/14/2013 - Review Complete 05/14/2013  Allergen Reaction Noted  . Penicillins      Family History  Problem Relation Age of Onset  . Melanoma Father   . Colon cancer Neg Hx   . Breast cancer Neg Hx   . Coronary artery disease Neg Hx     History   Social History  . Marital Status: Married    Spouse Name: N/A    Number of Children: 1  . Years of Education: N/A   Occupational History  . Homemaker    Social History Main Topics  . Smoking status: Never Smoker   . Smokeless tobacco: Never Used  . Alcohol Use: Yes     Comment: wine  . Drug Use: No  . Sexual Activity: Not on file   Other Topics Concern  . Not on file   Social History Narrative   University - [place], Masters Degree - counseling, [employer]   Married '69 - 7 years, divorced; married '82  21 daughter - '72; 2 grandchildren      Regular Exercise -  YES            Physical Exam: BP 128/70  Pulse 68  Ht 4\' 11"  (1.499 m)  Wt 126 lb 12.8 oz (57.516 kg)  BMI 25.6 kg/m2 Constitutional: generally well-appearing Psychiatric: alert and oriented x3 Abdomen: soft, nontender, nondistended, no obvious ascites, no peritoneal signs, normal bowel sounds     Assessment and plan: 67 y.o. female with Hemoccult-positive stool, chronic continued chronic rectal discomfort  Her rectal discomfort is not changed in several years. She underwent flexible sigmoidoscopy and pelvic MRI for it 3 years ago without any clear diagnosis. Perhaps she has symptomatic small hemorrhoids however those generally cause more anal discomfort. She does have some constipation I recommended she start fiber supplements for that. She has Hemoccult-positive and has not had colonoscopy in 8 years and so that should be repeated now. I see no reason for any further blood tests or imaging studies prior  to then.

## 2013-05-15 ENCOUNTER — Encounter: Payer: Self-pay | Admitting: Gastroenterology

## 2013-05-28 ENCOUNTER — Encounter: Payer: Self-pay | Admitting: Gastroenterology

## 2013-05-28 ENCOUNTER — Ambulatory Visit (AMBULATORY_SURGERY_CENTER): Payer: Medicare Other | Admitting: Gastroenterology

## 2013-05-28 VITALS — BP 124/75 | HR 53 | Temp 97.9°F | Resp 16 | Ht 59.0 in | Wt 126.0 lb

## 2013-05-28 DIAGNOSIS — K921 Melena: Secondary | ICD-10-CM

## 2013-05-28 DIAGNOSIS — K5730 Diverticulosis of large intestine without perforation or abscess without bleeding: Secondary | ICD-10-CM

## 2013-05-28 MED ORDER — SODIUM CHLORIDE 0.9 % IV SOLN: 500.0000 mL | INTRAVENOUS | Status: DC

## 2013-05-28 NOTE — Progress Notes (Signed)
Patient did not experience any of the following events: a burn prior to discharge; a fall within the facility; wrong site/side/patient/procedure/implant event; or a hospital transfer or hospital admission upon discharge from the facility. (G8907) Patient did not have preoperative order for IV antibiotic SSI prophylaxis. (G8918)  

## 2013-05-28 NOTE — Op Note (Signed)
[facility] [address] [place], [address]   COLONOSCOPY PROCEDURE REPORT  PATIENT: Eileen Hansen, [name]  MR#: [id number] BIRTHDATE: 25-Mar-1946 , 67  yrs. old GENDER: Female ENDOSCOPIST: Rachael Fee, MD PROCEDURE DATE:  05/28/2013 PROCEDURE:   Colonoscopy, diagnostic First Screening Colonoscopy - Avg.  risk and is 50 yrs.  old or older - No.  Prior Negative Screening - Now for repeat screening. N/A  History of Adenoma - Now for follow-up colonoscopy & has been > or = to 3 yrs.  N/A  Polyps Removed Today? No.  Recommend repeat exam, <10 yrs? No. ASA CLASS:   Class II INDICATIONS:FOBT positive stool. MEDICATIONS: Fentanyl 75 mcg IV, Versed 6 mg IV, and These medications were titrated to patient response per physician's verbal order  DESCRIPTION OF PROCEDURE:   After the risks benefits and alternatives of the procedure were thoroughly explained, informed consent was obtained.  A digital rectal exam revealed no abnormalities of the rectum.   The LB [id number]  endoscope was introduced through the anus and advanced to the cecum, which was identified by both the appendix and ileocecal valve. No adverse events experienced.   The quality of the prep was excellent.  The instrument was then slowly withdrawn as the colon was fully examined.   COLON FINDINGS: There were numerous diverticulum in the left colon. The examination was otherwise normal.  Retroflexed views revealed no abnormalities. The time to cecum=3 minutes 15 seconds. Withdrawal time=6 minutes 16 seconds.  The scope was withdrawn and the procedure completed. COMPLICATIONS: There were no complications.  ENDOSCOPIC IMPRESSION: There were numerous diverticulum in the left colon. The examination was otherwise normal.  No polyps or cancers.  RECOMMENDATIONS: Given your age, you will not need further colon cancer screening. These types of tests usually stop between age 70 and 25.   eSigned:  Rachael Fee, MD 05/28/2013 10:20 AM   cc: Wyonia Hough, MD

## 2013-05-28 NOTE — Progress Notes (Deleted)
No complaints noted in the recovery room. Maw   

## 2013-05-28 NOTE — Patient Instructions (Signed)
Discharge instructions given with verbal understanding. Handouts on diverticulosis and a high fiber diet. Resume previous medications. YOU HAD AN ENDOSCOPIC PROCEDURE TODAY AT THE [facility]: Refer to the procedure report that was given to you for any specific questions about what was found during the examination.  If the procedure report does not answer your questions, please call your gastroenterologist to clarify.  If you requested that your care partner not be given the details of your procedure findings, then the procedure report has been included in a sealed envelope for you to review at your convenience later.  YOU SHOULD EXPECT: Some feelings of bloating in the abdomen. Passage of more gas than usual.  Walking can help get rid of the air that was put into your GI tract during the procedure and reduce the bloating. If you had a lower endoscopy (such as a colonoscopy or flexible sigmoidoscopy) you may notice spotting of blood in your stool or on the toilet paper. If you underwent a bowel prep for your procedure, then you may not have a normal bowel movement for a few days.  DIET: Your first meal following the procedure should be a light meal and then it is ok to progress to your normal diet.  A half-sandwich or bowl of soup is an example of a good first meal.  Heavy or fried foods are harder to digest and may make you feel nauseous or bloated.  Likewise meals heavy in dairy and vegetables can cause extra gas to form and this can also increase the bloating.  Drink plenty of fluids but you should avoid alcoholic beverages for 24 hours.  ACTIVITY: Your care partner should take you home directly after the procedure.  You should plan to take it easy, moving slowly for the rest of the day.  You can resume normal activity the day after the procedure however you should NOT DRIVE or use heavy machinery for 24 hours (because of the sedation medicines used during the test).    SYMPTOMS TO REPORT  IMMEDIATELY: A gastroenterologist can be reached at any hour.  During normal business hours, 8:30 AM to 5:00 PM Monday through Friday, call [phone].  After hours and on weekends, please call the GI answering service at [phone] who will take a message and have the physician on call contact you.   Following lower endoscopy (colonoscopy or flexible sigmoidoscopy):  Excessive amounts of blood in the stool  Significant tenderness or worsening of abdominal pains  Swelling of the abdomen that is new, acute  Fever of 100F or higher FOLLOW UP: If any biopsies were taken you will be contacted by phone or by letter within the next 1-3 weeks.  Call your gastroenterologist if you have not heard about the biopsies in 3 weeks.  Our staff will call the home number listed on your records the next business day following your procedure to check on you and address any questions or concerns that you may have at that time regarding the information given to you following your procedure. This is a courtesy call and so if there is no answer at the home number and we have not heard from you through the emergency physician on call, we will assume that you have returned to your regular daily activities without incident.  SIGNATURES/CONFIDENTIALITY: You and/or your care partner have signed paperwork which will be entered into your electronic medical record.  These signatures attest to the fact that that the information above on your After   Visit Summary has been reviewed and is understood.  Full responsibility of the confidentiality of this discharge information lies with you and/or your care-partner. 

## 2013-05-28 NOTE — Progress Notes (Signed)
The pt tolerated the colonoscopy very well. Maw   

## 2013-05-29 ENCOUNTER — Telehealth: Payer: Self-pay | Admitting: *Deleted

## 2013-05-29 NOTE — Telephone Encounter (Signed)
  Follow up Call-  Call back number 05/28/2013  Post procedure Call Back phone  # [phone]  Permission to leave phone message Yes     Patient questions:  Do you have a fever, pain , or abdominal swelling? no Pain Score  0 *  Have you tolerated food without any problems? yes  Have you been able to return to your normal activities? yes  Do you have any questions about your discharge instructions: Diet   no Medications  no Follow up visit  no  Do you have questions or concerns about your Care? no  Actions: * If pain score is 4 or above: No action needed, pain <4.

## 2013-06-29 ENCOUNTER — Ambulatory Visit (INDEPENDENT_AMBULATORY_CARE_PROVIDER_SITE_OTHER): Payer: Medicare Other | Admitting: Family Medicine

## 2013-06-29 ENCOUNTER — Encounter: Payer: Self-pay | Admitting: Family Medicine

## 2013-06-29 VITALS — BP 164/96 | HR 83 | Temp 99.7°F | Ht 60.0 in | Wt 128.2 lb

## 2013-06-29 DIAGNOSIS — B349 Viral infection, unspecified: Secondary | ICD-10-CM

## 2013-06-29 DIAGNOSIS — J209 Acute bronchitis, unspecified: Secondary | ICD-10-CM

## 2013-06-29 DIAGNOSIS — B9789 Other viral agents as the cause of diseases classified elsewhere: Secondary | ICD-10-CM

## 2013-06-29 MED ORDER — HYDROCODONE-HOMATROPINE 5-1.5 MG/5ML PO SYRP: ORAL_SOLUTION | ORAL | Status: DC

## 2013-06-29 MED ORDER — ALBUTEROL SULFATE HFA 108 (90 BASE) MCG/ACT IN AERS: 2.0000 | INHALATION_SPRAY | RESPIRATORY_TRACT | Status: DC | PRN

## 2013-06-29 NOTE — Progress Notes (Signed)
Pre-visit discussion using our clinic review tool. No additional management support is needed unless otherwise documented below in the visit note.  OFFICE NOTE  06/29/2013  CC:  Chief Complaint  Patient presents with  . Fever     HPI: Patient is a 67 y.o. Hispanic female who is here for cough. Onset of nasal congestion, runny nose, ST, cough--all worsening for the last 24h.   Tm 100.  No subjective f/c.  No aches but +fatigue. She got a flu vaccine this year.  Nonsmoker.  No OTC meds tried.   Pertinent PMH:  Past Medical History  Diagnosis Date  . Allergic rhinitis   . HTN (hypertension)   . Tachycardia     Episodic    MEDS:  Outpatient Prescriptions Prior to Visit  Medication Sig Dispense Refill  . aspirin 81 MG tablet Take 81 mg by mouth daily.      Marland Kitchen b complex vitamins tablet Take 1 tablet by mouth daily.        . Cholecalciferol 1000 UNITS tablet Take 1,000 Units by mouth daily.        . Coenzyme Q10 (CO Q 10 PO) Take 300 mg by mouth daily.      . fish oil-omega-3 fatty acids 1000 MG capsule Take 1 g by mouth daily.        . Glucosamine HCl-MSM 750-750 MG TABS Take 2 tablets by mouth daily. 1500 mg daily      . hydrochlorothiazide (MICROZIDE) 12.5 MG capsule TAKE 1 CAPSULE (12.5 MG TOTAL) BY MOUTH EVERY MORNING.  90 capsule  3  . lisinopril (PRINIVIL,ZESTRIL) 10 MG tablet TAKE 2 TABLETS BY MOUTH ONCE A DAY  180 tablet  1  . metoprolol tartrate (LOPRESSOR) 25 MG tablet TAKE 1 TABLET BY MOUTH ONCE DAILY  90 tablet  1   Facility-Administered Medications Prior to Visit  Medication Dose Route Frequency Provider Last Rate Last Dose  . hepatitis A vaccine (HAVRIX) 720 Units  0.5 mL Intramuscular Once Jacques Navy, MD      . pneumococcal 23 valent vaccine (PNU-IMMUNE) injection 0.5 mL  0.5 mL Intramuscular Once Jacques Navy, MD      . Lady Gary (BOOSTRIX) injection 0.5 mL  0.5 mL Intramuscular Once Jacques Navy, MD        PE: Blood pressure 164/96, pulse 83,  temperature 99.7 F (37.6 C), temperature source Temporal, height 5' (1.524 m), weight 128 lb 4 oz (58.174 kg), SpO2 95.00%. VS: noted--normal. Gen: alert, NAD, tired but NONTOXIC APPEARING. HEENT: eyes without injection, drainage, or swelling.  Ears: EACs clear, TMs with normal light reflex and landmarks.  Nose: Clear rhinorrhea, with some dried, crusty exudate adherent to mildly injected mucosa.  No purulent d/c.  No paranasal sinus TTP.  No facial swelling.  Throat and mouth without focal lesion.  No pharyngial swelling, erythema, or exudate.   Neck: supple, no LAD.   LUNGS: CTA bilat, nonlabored resps.  Some post-exhalation coughing noted. CV: RRR, no m/r/g. EXT: no c/c/e SKIN: no rash  LAB: none  IMPRESSION AND PLAN:  Viral URI with acute bronchitis, mild RAD likely. Not likely influenza. Rx for hycodan susp, 1-2 tsp q6h prn, #123ml, no RF.  Therapeutic expectations and side effect profile of medication discussed today.  Patient's questions answered. Ventolin HFA 1-2 puffs q4h prn, nurse teaching on inhaler technique was done today. Signs/symptoms to call or return for were reviewed and pt expressed understanding.   FOLLOW UP: prn

## 2013-06-29 NOTE — Patient Instructions (Signed)
Fluids fluids fluids  

## 2013-07-10 ENCOUNTER — Encounter: Payer: Self-pay | Admitting: Internal Medicine

## 2013-07-10 ENCOUNTER — Ambulatory Visit (INDEPENDENT_AMBULATORY_CARE_PROVIDER_SITE_OTHER): Payer: Medicare Other | Admitting: Internal Medicine

## 2013-07-10 VITALS — BP 130/76 | HR 66 | Wt 125.8 lb

## 2013-07-10 DIAGNOSIS — M5030 Other cervical disc degeneration, unspecified cervical region: Secondary | ICD-10-CM

## 2013-07-10 DIAGNOSIS — J069 Acute upper respiratory infection, unspecified: Secondary | ICD-10-CM

## 2013-07-10 MED ORDER — BENZONATATE 100 MG PO CAPS: 100.0000 mg | ORAL_CAPSULE | Freq: Three times a day (TID) | ORAL | Status: DC

## 2013-07-10 MED ORDER — AZITHROMYCIN 500 MG PO TABS: 500.0000 mg | ORAL_TABLET | Freq: Once | ORAL | Status: DC

## 2013-07-10 MED ORDER — PREDNISONE 10 MG PO TABS: 10.0000 mg | ORAL_TABLET | ORAL | Status: DC

## 2013-07-10 NOTE — Progress Notes (Signed)
Subjective:    Patient ID: Eileen Hansen, female    DOB: 08-Apr-1946, 67 y.o.   MRN: [id number]  HPI Eileen Hansen was seen Dec 12 th at [facility] [place] - diagnosed with viral URI with acute bronchitis. She was treated with hyodan for cough and ventolin HFA. She reports that she still has a cough and expiratory wheezing and that the inhaler has noted worked well. She describes poor technique with use. She has had no fever, no sputum production. She has profound malaise  She has had a deep pain in the forearms bilaterally. She describes a stabbing pain, like it is in the bone. No focal weakness or loss of sensation. Chart reviewed: MRI [place] '13 - multilevel disc disease with foraminal encroachmentr C5-6, C6-7  PMH, FamHx and SocHx reviewed for any changes and relevance.  Current Outpatient Prescriptions on File Prior to Visit  Medication Sig Dispense Refill  . albuterol (VENTOLIN HFA) 108 (90 BASE) MCG/ACT inhaler Inhale 2 puffs into the lungs every 4 (four) hours as needed for wheezing or shortness of breath.  1 Inhaler  0  . aspirin 81 MG tablet Take 81 mg by mouth daily.      Marland Kitchen b complex vitamins tablet Take 1 tablet by mouth daily.        . Cholecalciferol 1000 UNITS tablet Take 1,000 Units by mouth daily.        . Coenzyme Q10 (CO Q 10 PO) Take 300 mg by mouth daily.      . fish oil-omega-3 fatty acids 1000 MG capsule Take 1 g by mouth daily.        . Glucosamine HCl-MSM 750-750 MG TABS Take 2 tablets by mouth daily. 1500 mg daily      . hydrochlorothiazide (MICROZIDE) 12.5 MG capsule TAKE 1 CAPSULE (12.5 MG TOTAL) BY MOUTH EVERY MORNING.  90 capsule  3  . lisinopril (PRINIVIL,ZESTRIL) 10 MG tablet TAKE 2 TABLETS BY MOUTH ONCE A DAY  180 tablet  1  . metoprolol tartrate (LOPRESSOR) 25 MG tablet TAKE 1 TABLET BY MOUTH ONCE DAILY  90 tablet  1  . HYDROcodone-homatropine (HYCODAN) 5-1.5 MG/5ML syrup 1-2 tsp po q6h prn cough/congestion  120 mL  0   Current Facility-Administered Medications on File  Prior to Visit  Medication Dose Route Frequency Provider Last Rate Last Dose  . hepatitis A vaccine (HAVRIX) 720 Units  0.5 mL Intramuscular Once Jacques Navy, MD      . pneumococcal 23 valent vaccine (PNU-IMMUNE) injection 0.5 mL  0.5 mL Intramuscular Once Jacques Navy, MD      . Lady Gary (BOOSTRIX) injection 0.5 mL  0.5 mL Intramuscular Once Jacques Navy, MD          Review of Systems System review is negative for any constitutional, cardiac, pulmonary, GI or neuro symptoms or complaints other than as described in the HPI.     Objective:   Physical Exam Filed Vitals:   07/10/13 1549  BP: 130/76  Pulse: 66   Gen'l- WNWD woman in no distress HEENT - TMs normal, Throat clear Neck- supple, no thyromegaly Nodes - negative Cor - 2+ radial pulse, RRR Pulm - normal respirations, lungs are clear, coughing noted with upper airway constriction Neuro - normal       Assessment & Plan:  1. Respiratory infection with cough - may have an atypical infection given the duration of symptoms.  Plan Continue the robitussin DM  Prednisone 20 mg daily for three days,  then 10 mg daily for 6 days. (for the cough and upper airway tightness)  Tessalon perles 100 mg three times a day ( for the tickle cough)  Azithromycine 500 mg once a day for three days ( an antibiotic for atypical organisms; three days = 10 days of coverage)  Hydrate

## 2013-07-10 NOTE — Patient Instructions (Signed)
Respiratory infection with cough - may have an atypical infection given the duration of symptoms.  Plan Continue the robitussin DM  Prednisone 20 mg daily for three days, then 10 mg daily for 6 days. (for the cough and upper airway tightness)  Tessalon perles 100 mg three times a day ( for the tickle cough)  Azithromycine 500 mg once a day for three days ( an antibiotic for atypical organisms; three days = 10 days of coverage)  Hydrate

## 2013-07-10 NOTE — Progress Notes (Signed)
Pre visit review using our clinic review tool, if applicable. No additional management support is needed unless otherwise documented below in the visit note. 

## 2013-07-14 NOTE — Assessment & Plan Note (Signed)
Arm pain c/w radicular discomfort. Radiology report and images reviewed with her.  Plan PT and stretching  For persistent arm pain will need MRI

## 2013-09-29 ENCOUNTER — Other Ambulatory Visit: Payer: Self-pay | Admitting: Internal Medicine

## 2013-11-15 ENCOUNTER — Ambulatory Visit (INDEPENDENT_AMBULATORY_CARE_PROVIDER_SITE_OTHER): Payer: Medicare Other | Admitting: Nurse Practitioner

## 2013-11-15 ENCOUNTER — Encounter: Payer: Self-pay | Admitting: Nurse Practitioner

## 2013-11-15 VITALS — BP 112/70 | HR 59 | Temp 97.8°F | Ht 60.0 in | Wt 128.0 lb

## 2013-11-15 DIAGNOSIS — R202 Paresthesia of skin: Secondary | ICD-10-CM

## 2013-11-15 DIAGNOSIS — R209 Unspecified disturbances of skin sensation: Secondary | ICD-10-CM

## 2013-11-15 DIAGNOSIS — R6889 Other general symptoms and signs: Secondary | ICD-10-CM

## 2013-11-15 DIAGNOSIS — R0989 Other specified symptoms and signs involving the circulatory and respiratory systems: Secondary | ICD-10-CM

## 2013-11-15 NOTE — Progress Notes (Signed)
Pre visit review using our clinic review tool, if applicable. No additional management support is needed unless otherwise documented below in the visit note. 

## 2013-11-15 NOTE — Patient Instructions (Signed)
Please see physical therapist for Left arm tingling. If no improvement, I will refer to [facility]. In the meantime, you may perform the following exercises: Visit you tube for these exercise regimens: "physio Neck Exercises: Stretch & Relieve routine" by Dyane DustmanMichelle Kenway; "Pilates Seated Stretch" by [employer]; "Physio med Neck & Upper body stretch Exercises-Occupational Physiotherapy".  For choking sensation in throat: start daily sinus rinse. If no improvement, Please return. We can do trial of medication for reflux.  Nice to see you!

## 2013-11-15 NOTE — Progress Notes (Signed)
Subjective:     Eileen Hansen is a 68 y.o. female who presents for tingling in L arm. Started yesterday, better today, but arm feels heavy & thinks is weaker than R. Denies nausea, diaphoresis, SOB, LE swelling, palpitations, fatigue. 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 Minimal multilevel spinal stenosis and multilevel bilateral foraminal stenosis.   Also c/o occasional feeling of thick mucous in throat-feels like will get choked. Denies heartburn, nausea, diarrhea, constipation, globus sensation. No nasal congestion or cough, but reports mild spring allergies.   The following portions of the patient's history were reviewed and updated as appropriate: allergies, current medications, past medical history, past social history, past surgical history and problem list.  Review of Systems Pertinent items are noted in HPI.    Objective:    BP 112/70  Pulse 59  Temp(Src) 97.8 F (36.6 C) (Temporal)  Ht 5' (1.524 m)  Wt 128 lb (58.06 kg)  BMI 25.00 kg/m2  SpO2 97% BP 112/70  Pulse 59  Temp(Src) 97.8 F (36.6 C) (Temporal)  Ht 5' (1.524 m)  Wt 128 lb (58.06 kg)  BMI 25.00 kg/m2  SpO2 97% General appearance: alert, cooperative, appears stated age and no distress Head: Normocephalic, without obvious abnormality, atraumatic Eyes: negative findings: lids and lashes normal and conjunctivae and sclerae normal Ears: normal TM's and external ear canals both ears Throat: lips, mucosa, and tongue normal; teeth and gums normal Neck: no adenopathy, supple, symmetrical, trachea midline and thyroid not enlarged, symmetric, no tenderness/mass/nodules Back: mild kyphosis, no spinal tenderness Lungs: clear to auscultation bilaterally Heart: regular rate and rhythm, S1, S2 normal, no murmur, click, rub or gallop Pulses: 2+ and symmetric Skin: Skin color, texture, turgor normal. No rashes or lesions Lymph nodes: Cervical,  supraclavicular, and axillary nodes normal. Neurologic: Mental status: Alert, oriented, thought content appropriate Sensory: normal Motor: grossly normal Coordination: L hand & arm nml Gait: Normal   Strength: equal UE & LE MSK: Decreased ROM L shoulder, [facility] & anterior shoulder joint, painful posterior extension, incomplete overhead ROM Assessment & Plan:   1. Paresthesia of left arm likley radicular symptoms from neck.  - Ambulatory referral to Physical Therapy  2. Choking sensation w/ mucous in throat Daily sinus rinses throughout allergy season. F/u 2 weeks

## 2013-11-16 DIAGNOSIS — R0989 Other specified symptoms and signs involving the circulatory and respiratory systems: Secondary | ICD-10-CM | POA: Insufficient documentation

## 2013-11-16 DIAGNOSIS — R202 Paresthesia of skin: Secondary | ICD-10-CM | POA: Insufficient documentation

## 2013-11-16 LAB — HM PAP SMEAR: HM Pap smear: NORMAL

## 2013-11-30 ENCOUNTER — Ambulatory Visit: Payer: Medicare Other | Admitting: Physical Therapy

## 2013-12-05 ENCOUNTER — Ambulatory Visit (INDEPENDENT_AMBULATORY_CARE_PROVIDER_SITE_OTHER): Payer: Medicare Other | Admitting: Physical Therapy

## 2013-12-05 DIAGNOSIS — R209 Unspecified disturbances of skin sensation: Secondary | ICD-10-CM

## 2013-12-05 DIAGNOSIS — M2560 Stiffness of unspecified joint, not elsewhere classified: Secondary | ICD-10-CM

## 2013-12-05 DIAGNOSIS — M6281 Muscle weakness (generalized): Secondary | ICD-10-CM

## 2013-12-05 DIAGNOSIS — M2550 Pain in unspecified joint: Secondary | ICD-10-CM

## 2013-12-07 ENCOUNTER — Encounter (INDEPENDENT_AMBULATORY_CARE_PROVIDER_SITE_OTHER): Payer: Medicare Other | Admitting: Physical Therapy

## 2013-12-07 DIAGNOSIS — M2560 Stiffness of unspecified joint, not elsewhere classified: Secondary | ICD-10-CM

## 2013-12-07 DIAGNOSIS — M2550 Pain in unspecified joint: Secondary | ICD-10-CM

## 2013-12-07 DIAGNOSIS — R209 Unspecified disturbances of skin sensation: Secondary | ICD-10-CM

## 2013-12-07 DIAGNOSIS — M6281 Muscle weakness (generalized): Secondary | ICD-10-CM

## 2013-12-12 ENCOUNTER — Encounter (INDEPENDENT_AMBULATORY_CARE_PROVIDER_SITE_OTHER): Payer: Medicare Other | Admitting: Physical Therapy

## 2013-12-12 ENCOUNTER — Encounter: Payer: Self-pay | Admitting: Nurse Practitioner

## 2013-12-12 DIAGNOSIS — M2560 Stiffness of unspecified joint, not elsewhere classified: Secondary | ICD-10-CM

## 2013-12-12 DIAGNOSIS — R209 Unspecified disturbances of skin sensation: Secondary | ICD-10-CM

## 2013-12-12 DIAGNOSIS — M6281 Muscle weakness (generalized): Secondary | ICD-10-CM

## 2013-12-12 DIAGNOSIS — M2550 Pain in unspecified joint: Secondary | ICD-10-CM

## 2013-12-14 ENCOUNTER — Encounter (INDEPENDENT_AMBULATORY_CARE_PROVIDER_SITE_OTHER): Payer: Medicare Other | Admitting: Physical Therapy

## 2013-12-14 DIAGNOSIS — R209 Unspecified disturbances of skin sensation: Secondary | ICD-10-CM

## 2013-12-14 DIAGNOSIS — M2560 Stiffness of unspecified joint, not elsewhere classified: Secondary | ICD-10-CM

## 2013-12-14 DIAGNOSIS — M6281 Muscle weakness (generalized): Secondary | ICD-10-CM

## 2013-12-14 DIAGNOSIS — M2550 Pain in unspecified joint: Secondary | ICD-10-CM

## 2013-12-17 ENCOUNTER — Encounter: Payer: Medicare Other | Admitting: Physical Therapy

## 2013-12-19 ENCOUNTER — Encounter: Payer: Medicare Other | Admitting: Physical Therapy

## 2013-12-21 ENCOUNTER — Encounter (INDEPENDENT_AMBULATORY_CARE_PROVIDER_SITE_OTHER): Payer: Medicare Other

## 2013-12-21 DIAGNOSIS — M2560 Stiffness of unspecified joint, not elsewhere classified: Secondary | ICD-10-CM

## 2013-12-21 DIAGNOSIS — M2550 Pain in unspecified joint: Secondary | ICD-10-CM

## 2013-12-21 DIAGNOSIS — M6281 Muscle weakness (generalized): Secondary | ICD-10-CM

## 2013-12-21 DIAGNOSIS — R209 Unspecified disturbances of skin sensation: Secondary | ICD-10-CM

## 2013-12-24 ENCOUNTER — Encounter: Payer: Medicare Other | Admitting: Physical Therapy

## 2013-12-26 ENCOUNTER — Encounter (INDEPENDENT_AMBULATORY_CARE_PROVIDER_SITE_OTHER): Payer: Medicare Other | Admitting: Physical Therapy

## 2013-12-26 DIAGNOSIS — R209 Unspecified disturbances of skin sensation: Secondary | ICD-10-CM

## 2013-12-26 DIAGNOSIS — M6281 Muscle weakness (generalized): Secondary | ICD-10-CM

## 2013-12-26 DIAGNOSIS — M2560 Stiffness of unspecified joint, not elsewhere classified: Secondary | ICD-10-CM

## 2013-12-26 DIAGNOSIS — M2550 Pain in unspecified joint: Secondary | ICD-10-CM

## 2014-01-04 ENCOUNTER — Encounter (INDEPENDENT_AMBULATORY_CARE_PROVIDER_SITE_OTHER): Payer: Medicare Other | Admitting: Physical Therapy

## 2014-01-04 ENCOUNTER — Encounter: Payer: Medicare Other | Admitting: Physical Therapy

## 2014-01-04 DIAGNOSIS — R209 Unspecified disturbances of skin sensation: Secondary | ICD-10-CM

## 2014-01-04 DIAGNOSIS — M6281 Muscle weakness (generalized): Secondary | ICD-10-CM

## 2014-01-04 DIAGNOSIS — M2560 Stiffness of unspecified joint, not elsewhere classified: Secondary | ICD-10-CM

## 2014-01-04 DIAGNOSIS — M2550 Pain in unspecified joint: Secondary | ICD-10-CM

## 2014-01-16 ENCOUNTER — Encounter: Payer: Self-pay | Admitting: Nurse Practitioner

## 2014-02-11 LAB — HM MAMMOGRAPHY: HM Mammogram: NORMAL

## 2014-02-26 ENCOUNTER — Ambulatory Visit (INDEPENDENT_AMBULATORY_CARE_PROVIDER_SITE_OTHER): Payer: Medicare Other | Admitting: Family Medicine

## 2014-02-26 ENCOUNTER — Encounter: Payer: Self-pay | Admitting: Family Medicine

## 2014-02-26 VITALS — BP 130/64 | HR 63 | Temp 98.4°F | Ht 60.0 in | Wt 126.1 lb

## 2014-02-26 DIAGNOSIS — K5900 Constipation, unspecified: Secondary | ICD-10-CM

## 2014-02-26 DIAGNOSIS — Z23 Encounter for immunization: Secondary | ICD-10-CM

## 2014-02-26 DIAGNOSIS — I10 Essential (primary) hypertension: Secondary | ICD-10-CM

## 2014-02-26 DIAGNOSIS — M25532 Pain in left wrist: Secondary | ICD-10-CM

## 2014-02-26 DIAGNOSIS — M25539 Pain in unspecified wrist: Secondary | ICD-10-CM

## 2014-02-26 DIAGNOSIS — R000 Tachycardia, unspecified: Secondary | ICD-10-CM

## 2014-02-26 NOTE — Patient Instructions (Signed)
Zyrtec/Cetrizine 10 mg daily for allergies Zantac/Ranitidine 150mg  daily at bedtime for heartburn 64 oz clear fluids daily Wrist splint, ice and Salon Pas or Aspercreme twice daily when wrist hurts

## 2014-02-26 NOTE — Progress Notes (Signed)
Pre visit review using our clinic review tool, if applicable. No additional management support is needed unless otherwise documented below in the visit note. 

## 2014-02-28 ENCOUNTER — Ambulatory Visit (HOSPITAL_BASED_OUTPATIENT_CLINIC_OR_DEPARTMENT_OTHER)
Admission: RE | Admit: 2014-02-28 | Discharge: 2014-02-28 | Disposition: A | Discharge status: Home / Self Care | Payer: Medicare Other | Source: Ambulatory Visit | Attending: Family Medicine | Admitting: Family Medicine

## 2014-02-28 DIAGNOSIS — M25532 Pain in left wrist: Secondary | ICD-10-CM

## 2014-02-28 DIAGNOSIS — M25539 Pain in unspecified wrist: Secondary | ICD-10-CM | POA: Diagnosis present

## 2014-03-03 ENCOUNTER — Encounter: Payer: Self-pay | Admitting: Family Medicine

## 2014-03-03 DIAGNOSIS — M25539 Pain in unspecified wrist: Secondary | ICD-10-CM | POA: Insufficient documentation

## 2014-03-03 HISTORY — DX: Pain in unspecified wrist: M25.539

## 2014-03-03 NOTE — Assessment & Plan Note (Signed)
Try topical treatments and splinting. Consider referral if worsens xray only shows degenerative changes

## 2014-03-03 NOTE — Assessment & Plan Note (Signed)
resolved 

## 2014-03-03 NOTE — Assessment & Plan Note (Signed)
Encouraged increased hydration and fiber in diet. Daily probiotics. If bowels not moving can use MOM 2 tbls po in 4 oz of warm prune juice by mouth every 2-3 days. If no results then repeat in 4 hours with  Dulcolax suppository pr, may repeat again in 4 more hours as needed. Seek care if symptoms worsen. Consider daily Miralax and/or Dulcolax if symptoms persist.  

## 2014-03-03 NOTE — Progress Notes (Signed)
Patient ID: Eileen Hansen, female   DOB: May 01, 1946, 68 y.o.   MRN: [id number] Eileen Hansen [id number] May 01, 1946 03/03/2014      Progress Note-Follow Up  Subjective  Chief Complaint  Chief Complaint  Patient presents with  . Establish Care    new patient- transfer from Dr Debby BudNorins  . Injections    prevnar    HPI  Patient is a 68 year old female in today for routine medical care. Care. She's complaining of some left wrist it is worse with flexion. No falls or injury. No swelling or warmth at this time is noting some throat irritation and intermittent clear mucous in her throat when she lies down at night off and on. No fevers or chills. Denies CP/palp/SOB/HA/congestion/fevers/GI or GU c/o. Taking meds as prescribed  Past Medical History  Diagnosis Date  . Allergic rhinitis   . HTN (hypertension)   . Tachycardia     Episodic    Past Surgical History  Procedure Laterality Date  . Bunionectomy    . Appendectomy  68 yrs old  . Wisdom tooth extraction  68 yrs old    Family History  Problem Relation Age of Onset  . Cancer Father     melanoma  . Colon cancer Neg Hx   . Breast cancer Neg Hx   . Coronary artery disease Neg Hx   . Diabetes Mother   . Hypertension Mother   . Hyperlipidemia Mother   . Stroke Mother   . Heart attack Mother   . Heart attack Brother   . Cancer Brother     pancreatic    History   Social History  . Marital Status: Married    Spouse Name: N/A    Number of Children: 1  . Years of Education: N/A   Occupational History  . Homemaker    Social History Main Topics  . Smoking status: Never Smoker   . Smokeless tobacco: Never Used  . Alcohol Use: Yes     Comment: wine  . Drug Use: No  . Sexual Activity: Yes     Comment: lives with husband   Other Topics Concern  . Not on file   Social History Narrative   University - [place], Masters Degree - counseling, [employer]   Married '69 - 7 years, divorced; married '82   21  daughter - '72; 2 grandchildren      Regular Exercise -  YES          Current Outpatient Prescriptions on File Prior to Visit  Medication Sig Dispense Refill  . aspirin 81 MG tablet Take 81 mg by mouth daily.      Marland Kitchen. b complex vitamins tablet Take 1 tablet by mouth daily.        . fish oil-omega-3 fatty acids 1000 MG capsule Take 1 g by mouth daily.        . Glucosamine HCl-MSM 750-750 MG TABS Take 2 tablets by mouth daily. 1500 mg daily      . lisinopril (PRINIVIL,ZESTRIL) 10 MG tablet TAKE 2 TABLETS BY MOUTH ONCE A DAY  180 tablet  1  . metoprolol tartrate (LOPRESSOR) 25 MG tablet TAKE 1 TABLET BY MOUTH ONCE DAILY  90 tablet  1   Current Facility-Administered Medications on File Prior to Visit  Medication Dose Route Frequency Provider Last Rate Last Dose  . hepatitis A vaccine (HAVRIX) 720 Units  0.5 mL Intramuscular Once Jacques NavyMichael E Norins, MD      .  pneumococcal 23 valent vaccine (PNU-IMMUNE) injection 0.5 mL  0.5 mL Intramuscular Once Jacques Navy, MD      . TDaP (BOOSTRIX) injection 0.5 mL  0.5 mL Intramuscular Once Jacques Navy, MD        Allergies  Allergen Reactions  . Penicillins     REACTION: Hives Anaphylaxis    Review of Systems  Review of Systems  Constitutional: Negative for fever and malaise/fatigue.  HENT: Negative for congestion.        Is noting throat irritation worse in pm. No pain, dysphagia  Eyes: Negative for discharge.  Respiratory: Negative for shortness of breath.   Cardiovascular: Negative for chest pain, palpitations and leg swelling.  Gastrointestinal: Negative for nausea, abdominal pain and diarrhea.  Genitourinary: Negative for dysuria.  Musculoskeletal: Positive for joint pain. Negative for falls.       Left wrist pain, worse with movement. No redness or swelling.   Skin: Negative for rash.  Neurological: Negative for loss of consciousness and headaches.  Endo/Heme/Allergies: Negative for polydipsia.  Psychiatric/Behavioral: Negative  for depression and suicidal ideas. The patient is not nervous/anxious and does not have insomnia.     Objective  BP 130/64  Pulse 63  Temp(Src) 98.4 F (36.9 C) (Oral)  Ht 5' (1.524 m)  Wt 126 lb 1.9 oz (57.208 kg)  BMI 24.63 kg/m2  SpO2 98%  Physical Exam  Physical Exam  Constitutional: She is oriented to person, place, and time and well-developed, well-nourished, and in no distress. No distress.  HENT:  Head: Normocephalic and atraumatic.  Mild erythema in posterior oropharynx. No lesions  Eyes: Conjunctivae are normal.  Neck: Neck supple. No thyromegaly present.  Cardiovascular: Normal rate, regular rhythm and normal heart sounds.   No murmur heard. Pulmonary/Chest: Effort normal and breath sounds normal. She has no wheezes.  Abdominal: She exhibits no distension and no mass.  Musculoskeletal: She exhibits no edema.  Lymphadenopathy:    She has no cervical adenopathy.  Neurological: She is alert and oriented to person, place, and time.  Skin: Skin is warm and dry. No rash noted. She is not diaphoretic.  Psychiatric: Memory, affect and judgment normal.    Lab Results  Component Value Date   TSH 1.79 03/29/2011   Lab Results  Component Value Date   WBC 6.4 05/16/2012   HGB 14.3 04/19/2013   HCT 41.4 04/19/2013   MCV 90.6 05/16/2012   PLT 241.0 05/16/2012   Lab Results  Component Value Date   CREATININE 0.8 04/19/2013   BUN 18 04/19/2013   NA 139 04/19/2013   K 4.1 04/19/2013   CL 101 04/19/2013   CO2 27 04/19/2013   Lab Results  Component Value Date   ALT 30 04/19/2013   ALT 30 04/19/2013   AST 28 04/19/2013   AST 28 04/19/2013   ALKPHOS 54 04/19/2013   ALKPHOS 54 04/19/2013   BILITOT 0.7 04/19/2013   BILITOT 0.7 04/19/2013   Lab Results  Component Value Date   CHOL 230* 04/19/2013   Lab Results  Component Value Date   HDL 32.40* 04/19/2013   Lab Results  Component Value Date   LDLCALC 130* 03/29/2011   Lab Results  Component Value Date   TRIG 262.0*  04/19/2013   Lab Results  Component Value Date   CHOLHDL 7 04/19/2013     Assessment & Plan  HYPERTENSION Well controlled, no changes to meds. Encouraged heart healthy diet such as the DASH diet and exercise as tolerated.  CONSTIPATION Encouraged increased hydration and fiber in diet. Daily probiotics. If bowels not moving can use MOM 2 tbls po in 4 oz of warm prune juice by mouth every 2-3 days. If no results then repeat in 4 hours with  Dulcolax suppository pr, may repeat again in 4 more hours as needed. Seek care if symptoms worsen. Consider daily Miralax and/or Dulcolax if symptoms persist.   TACHYCARDIA resolved  Left wrist pain Try topical treatments and splinting. Consider referral if worsens xray only shows degenerative changes

## 2014-03-03 NOTE — Assessment & Plan Note (Signed)
Well controlled, no changes to meds. Encouraged heart healthy diet such as the DASH diet and exercise as tolerated.  °

## 2014-04-05 ENCOUNTER — Encounter: Payer: Self-pay | Admitting: Family

## 2014-04-05 ENCOUNTER — Ambulatory Visit (INDEPENDENT_AMBULATORY_CARE_PROVIDER_SITE_OTHER): Payer: Medicare Other | Admitting: Family

## 2014-04-05 VITALS — HR 58 | Temp 98.0°F | Resp 18 | Ht 60.0 in | Wt 128.6 lb

## 2014-04-05 DIAGNOSIS — R739 Hyperglycemia, unspecified: Secondary | ICD-10-CM

## 2014-04-05 DIAGNOSIS — Z13820 Encounter for screening for osteoporosis: Secondary | ICD-10-CM

## 2014-04-05 DIAGNOSIS — I10 Essential (primary) hypertension: Secondary | ICD-10-CM

## 2014-04-05 DIAGNOSIS — E785 Hyperlipidemia, unspecified: Secondary | ICD-10-CM

## 2014-04-05 DIAGNOSIS — Z0000 Encounter for general adult medical examination without abnormal findings: Secondary | ICD-10-CM

## 2014-04-05 DIAGNOSIS — Z23 Encounter for immunization: Secondary | ICD-10-CM

## 2014-04-05 DIAGNOSIS — R7309 Other abnormal glucose: Secondary | ICD-10-CM

## 2014-04-05 LAB — BASIC METABOLIC PANEL
BUN: 19 mg/dL (ref 6–23)
CALCIUM: 9.8 mg/dL (ref 8.4–10.5)
CO2: 24 mEq/L (ref 19–32)
CREATININE: 0.7 mg/dL (ref 0.4–1.2)
Chloride: 105 mEq/L (ref 96–112)
GFR: 85.09 mL/min (ref >60.00)
Glucose, Bld: 111 mg/dL — ABNORMAL HIGH (ref 70–99)
Potassium: 4.3 mEq/L (ref 3.5–5.1)
Sodium: 140 mEq/L (ref 135–145)

## 2014-04-05 LAB — LIPID PANEL
CHOL/HDL RATIO: 8
CHOLESTEROL: 213 mg/dL — AB (ref 0–200)
HDL: 27.6 mg/dL — AB (ref >39.00)
NONHDL: 185.4
TRIGLYCERIDES: 220 mg/dL — AB (ref 0.0–149.0)
VLDL: 44 mg/dL — ABNORMAL HIGH (ref 0.0–40.0)

## 2014-04-05 LAB — HEPATIC FUNCTION PANEL
ALBUMIN: 4.3 g/dL (ref 3.5–5.2)
ALT: 20 U/L (ref 0–35)
AST: 22 U/L (ref 0–37)
Alkaline Phosphatase: 63 U/L (ref 39–117)
BILIRUBIN TOTAL: 0.6 mg/dL (ref 0.2–1.2)
Bilirubin, Direct: 0 mg/dL (ref 0.0–0.3)
Total Protein: 7.9 g/dL (ref 6.0–8.3)

## 2014-04-05 LAB — LDL CHOLESTEROL, DIRECT: Direct LDL: 152 mg/dL

## 2014-04-05 LAB — HEMOGLOBIN A1C: HEMOGLOBIN A1C: 6.1 % (ref 4.6–6.5)

## 2014-04-05 MED ORDER — METOPROLOL TARTRATE 25 MG PO TABS: ORAL_TABLET | ORAL | Status: DC

## 2014-04-05 MED ORDER — LISINOPRIL 10 MG PO TABS: ORAL_TABLET | ORAL | Status: DC

## 2014-04-05 NOTE — Progress Notes (Signed)
Patient ID: Eileen Hansen, female   DOB: [birth year], 68 y.o.   MRN: [id number] Subjective:   Patient here for Medicare annual wellness visit and management of other chronic and acute problems.  Immunizations: tetanus up to date, due for shingles vaccine and flu shot Diet: reports healthy diet.  Exercise: needs to exercise Colonoscopy: up to date Dexa: due Pap Smear: up to date (Dr. Carrington Clamp) Mammogram: up to date  HTN- maintained on lisinopril, hctz, metoprolol    Risk factors: HTN- on baby aspirin for cardiac protection  Roster of Physicians Providing Medical Care to Patient: Dr. Henderson Cloud- GYN Dr. Hyacinth Meeker- opthalmology  Activities of Daily Living  In your present state of health, do you have any difficulty performing the following activities? Preparing food and eating?: No  Bathing yourself: No  Getting dressed: No  Using the toilet:No  Moving around from place to place: No  In the past year have you fallen or had a near fall?:No    Home Safety: Has smoke detector and wears seat belts. No firearms. No excess sun exposure.  Diet and Exercise  Current exercise habits: none Dietary issues discussed: healthy diet   Depression Screen  (Note: if answer to either of the following is "Yes", then a more complete depression screening is indicated)  Q1: Over the past two weeks, have you felt down, depressed or hopeless?no  Q2: Over the past two weeks, have you felt little interest or pleasure in doing things? no   The following portions of the patient's history were reviewed and updated as appropriate: allergies, current medications, past family history, past medical history, past social history, past surgical history and problem list.   Past Medical History  Diagnosis Date  . Allergic rhinitis   . HTN (hypertension)   . Tachycardia     Episodic    History   Social History  . Marital Status: Married    Spouse Name: N/A    Number of Children: 1  . Years of Education:  N/A   Occupational History  . Homemaker    Social History Main Topics  . Smoking status: Never Smoker   . Smokeless tobacco: Never Used  . Alcohol Use: Yes     Comment: wine  . Drug Use: No  . Sexual Activity: Yes     Comment: lives with husband   Other Topics Concern  . Not on file   Social History Narrative   University - [place], Masters Degree - counseling, [employer]   Married '69 - 7 years, divorced; married '82   21 daughter - '72; 2 grandchildren      Regular Exercise -  YES          Past Surgical History  Procedure Laterality Date  . Bunionectomy    . Appendectomy  68 yrs old  . Wisdom tooth extraction  68 yrs old    Family History  Problem Relation Age of Onset  . Cancer Father     melanoma  . Colon cancer Neg Hx   . Breast cancer Neg Hx   . Coronary artery disease Neg Hx   . Diabetes Mother   . Hypertension Mother   . Hyperlipidemia Mother   . Stroke Mother   . Heart attack Mother   . Heart attack Brother   . Cancer Brother     pancreatic    Allergies  Allergen Reactions  . Penicillins     REACTION: Hives Anaphylaxis    Current Outpatient Prescriptions  on File Prior to Visit  Medication Sig Dispense Refill  . aspirin 81 MG tablet Take 81 mg by mouth daily.      Marland Kitchen b complex vitamins tablet Take 1 tablet by mouth daily.        . Cholecalciferol (VITAMIN D-3) 1000 UNITS CAPS Take 2,000 capsules by mouth daily.      . fish oil-omega-3 fatty acids 1000 MG capsule Take 1 g by mouth daily.        . Glucosamine HCl-MSM 750-750 MG TABS Take 2 tablets by mouth daily. 1500 mg daily      . hydrochlorothiazide (MICROZIDE) 12.5 MG capsule Take 12.5 mg by mouth daily.       Current Facility-Administered Medications on File Prior to Visit  Medication Dose Route Frequency Provider Last Rate Last Dose  . hepatitis A vaccine (HAVRIX) 720 Units  0.5 mL Intramuscular Once Jacques Navy, MD      . pneumococcal 23 valent vaccine (PNU-IMMUNE)  injection 0.5 mL  0.5 mL Intramuscular Once Jacques Navy, MD      . TDaP (BOOSTRIX) injection 0.5 mL  0.5 mL Intramuscular Once Jacques Navy, MD        Pulse 58  Temp(Src) 98 F (36.7 C) (Oral)  Resp 18  Ht 5' (1.524 m)  Wt 128 lb 9.6 oz (58.333 kg)  BMI 25.12 kg/m2  SpO2 97%   ROS  She does report rare intermittent RUQ discomfort.   Objective:   Vision: see nursing Hearing:  Able to hear forced whisper at 6 feet.  Body mass index: 24.63 Cognitive Impairment Assessment: cognition, memory and judgment appear normal.   Physical Exam  Constitutional: She is oriented to person, place, and time. She appears well-developed and well-nourished. No distress.  HENT:  Head: Normocephalic and atraumatic.  Right Ear: Tympanic membrane and ear canal normal.  Left Ear: Tympanic membrane and ear canal normal.  Mouth/Throat: Oropharynx is clear and moist.  Eyes: Pupils are equal, round, and reactive to light. No scleral icterus.  Neck: Normal range of motion. No thyromegaly present.  Cardiovascular: Normal rate and regular rhythm.   No murmur heard. Pulmonary/Chest: Effort normal and breath sounds normal. No respiratory distress. He has no wheezes. She has no rales. She exhibits no tenderness.  Abdominal: Soft. Bowel sounds are normal. He exhibits no distension and no mass. There very mild epigastric tenderness. There is no rebound and no guarding.  Musculoskeletal: She exhibits no edema.  Lymphadenopathy:    She has no cervical adenopathy.  Neurological: She is alert and oriented to person, place, and time. She has normal reflexes. She exhibits normal muscle tone. Coordination normal.  Skin: Skin is warm and dry.  Breast/pelvic: deferred to GYN          Assessment & Plan:    Assessment:   Medicare wellness- due for flu shot and bone density. Plan:    During the course of the visit the patient was educated and counseled about appropriate screening and preventive  services including:       Bone densitometry screening  Diabetes screening- has had hyperglycemia in the past- check A1C Immunizations Vaccines / LABS       Flu shot today, A1C, BMET, lipids, LFT  Patient Instructions (the written plan) was given to the patient.   Pt declines further evaluation of mild, rare RUQ discomfort at this time.

## 2014-04-05 NOTE — Progress Notes (Signed)
Pre visit review using our clinic review tool, if applicable. No additional management support is needed unless otherwise documented below in the visit note. 

## 2014-04-05 NOTE — Progress Notes (Signed)
   Subjective:    Patient ID: Eileen Hansen, female    DOB: 11-04-1945, 68 y.o.   MRN: [id number]  HPI  Eileen Hansen is a    Review of Systems     Objective:   Physical Exam        Assessment & Plan:

## 2014-04-05 NOTE — Patient Instructions (Signed)
Please complete lab work prior to leaving. You will be contacted about scheduling your bone density. Call us if you have not heard back in 1 week. Check with your insurance re: coverage of shingles vaccine (Zostavax) Follow up with Dr. Abner Greenspan in 6 months.

## 2014-04-07 ENCOUNTER — Telehealth: Payer: Self-pay | Admitting: Family

## 2014-04-07 DIAGNOSIS — E785 Hyperlipidemia, unspecified: Secondary | ICD-10-CM

## 2014-04-07 DIAGNOSIS — R7303 Prediabetes: Secondary | ICD-10-CM

## 2014-04-07 MED ORDER — ATORVASTATIN CALCIUM 10 MG PO TABS: 10.0000 mg | ORAL_TABLET | Freq: Every day | ORAL | Status: DC

## 2014-04-07 NOTE — Telephone Encounter (Signed)
A1C shows borderline diabetes.  She should work on avoiding concentrated sweets/white fluffy carbs and getting regular exercise.  Cholesterol is elevated.  I would like her to work on low cholesterol diet and start low dose atorvastatin. Call if unusual joint pain/muscle pain.  Repeat lft/lipids in 6 weeks, dx hyperlipidemia

## 2014-04-08 NOTE — Telephone Encounter (Signed)
Attempt to reach pt via phone, line will not ring; tried on different office phones/SLS

## 2014-04-09 NOTE — Telephone Encounter (Signed)
Patient informed, understood & agreed; future lab orders placed/SLS 

## 2014-04-10 ENCOUNTER — Ambulatory Visit (INDEPENDENT_AMBULATORY_CARE_PROVIDER_SITE_OTHER)
Admission: RE | Admit: 2014-04-10 | Discharge: 2014-04-10 | Disposition: A | Discharge status: Home / Self Care | Payer: Medicare Other | Source: Ambulatory Visit | Attending: Family Medicine | Admitting: Family Medicine

## 2014-04-10 DIAGNOSIS — Z13820 Encounter for screening for osteoporosis: Secondary | ICD-10-CM

## 2014-04-15 ENCOUNTER — Telehealth: Payer: Self-pay | Admitting: Family

## 2014-04-15 DIAGNOSIS — M8580 Other specified disorders of bone density and structure, unspecified site: Secondary | ICD-10-CM

## 2014-04-15 NOTE — Telephone Encounter (Signed)
Please notify pt that bone density shows mild bone thinning.  Add caltrate  + D twice daily and get regular exercise such as walking.

## 2014-04-15 NOTE — Telephone Encounter (Signed)
Left message with pt's husband to have pt return my call. 

## 2014-04-16 NOTE — Telephone Encounter (Signed)
Returning call [phone]

## 2014-04-16 NOTE — Telephone Encounter (Signed)
Left message for pt to return my call and let me know if I can leave her a detailed message when I call her back.

## 2014-04-22 NOTE — Telephone Encounter (Signed)
Mailed letter °

## 2014-04-26 NOTE — Telephone Encounter (Signed)
Pt called and below information was given. Pt voices understanding.

## 2014-05-01 ENCOUNTER — Telehealth: Payer: Self-pay | Admitting: *Deleted

## 2014-05-01 NOTE — Telephone Encounter (Signed)
Left msg on triage needing a refill on pt HCTZ. Pt is now seeing Dr. Rogelia RohrerBlythe forwarding msg to [place] office...Raechel Chute/lmb

## 2014-05-01 NOTE — Telephone Encounter (Signed)
OK to refill HCTZ 12.5 mg daily. Disp 30 day supply with 5 rf or 90 days supply with 1 rf

## 2014-05-02 ENCOUNTER — Other Ambulatory Visit: Payer: Self-pay

## 2014-05-02 MED ORDER — HYDROCHLOROTHIAZIDE 12.5 MG PO CAPS: 12.5000 mg | ORAL_CAPSULE | Freq: Every day | ORAL | Status: DC

## 2014-05-02 NOTE — Telephone Encounter (Signed)
Patient's medication refilled.

## 2014-05-20 ENCOUNTER — Other Ambulatory Visit (INDEPENDENT_AMBULATORY_CARE_PROVIDER_SITE_OTHER): Payer: Medicare Other

## 2014-05-20 ENCOUNTER — Encounter: Payer: Self-pay | Admitting: Family

## 2014-05-20 DIAGNOSIS — E785 Hyperlipidemia, unspecified: Secondary | ICD-10-CM

## 2014-05-20 LAB — HEPATIC FUNCTION PANEL
ALT: 22 U/L (ref 0–35)
AST: 25 U/L (ref 0–37)
Albumin: 3.8 g/dL (ref 3.5–5.2)
Alkaline Phosphatase: 58 U/L (ref 39–117)
Bilirubin, Direct: 0 mg/dL (ref 0.0–0.3)
TOTAL PROTEIN: 7.4 g/dL (ref 6.0–8.3)
Total Bilirubin: 0.7 mg/dL (ref 0.2–1.2)

## 2014-05-20 LAB — LIPID PANEL
CHOLESTEROL: 194 mg/dL (ref 0–200)
HDL: 27.3 mg/dL — AB (ref >39.00)
NonHDL: 166.7
Total CHOL/HDL Ratio: 7
Triglycerides: 206 mg/dL — ABNORMAL HIGH (ref 0.0–149.0)
VLDL: 41.2 mg/dL — AB (ref 0.0–40.0)

## 2014-05-20 LAB — LDL CHOLESTEROL, DIRECT: Direct LDL: 121.2 mg/dL

## 2014-05-21 ENCOUNTER — Other Ambulatory Visit: Payer: Self-pay | Admitting: Family

## 2014-05-21 DIAGNOSIS — E785 Hyperlipidemia, unspecified: Secondary | ICD-10-CM

## 2014-05-21 NOTE — Telephone Encounter (Signed)
Please let pt know that cholesterol is improving but triglycerides still above goal.   I would like her to increase atorvastatin from 10mg  to 20mg  repeat flp in 6 weeks, dx hyperlipidemia.

## 2014-05-22 ENCOUNTER — Encounter: Payer: Self-pay | Admitting: Family

## 2014-05-22 NOTE — Telephone Encounter (Signed)
Patient returning phone call 

## 2014-05-22 NOTE — Telephone Encounter (Signed)
Left message on home # to return my call. 

## 2014-05-23 MED ORDER — ATORVASTATIN CALCIUM 20 MG PO TABS: 20.0000 mg | ORAL_TABLET | Freq: Every day | ORAL | Status: DC

## 2014-05-23 NOTE — Telephone Encounter (Signed)
LM for patient to return the call.  

## 2014-05-24 NOTE — Telephone Encounter (Signed)
Notified pt. She states she is "afraid to take statins". Has been modifying diet. Pt states she will continue with modified diet and exercising through [employer] programs twice a week. States she has eliminated carbohydrates and reduced sugar intake and eliminated wine. Pt would like to continue lifstyle changes. Please advise.

## 2014-06-05 ENCOUNTER — Encounter: Payer: Self-pay | Admitting: Family

## 2014-09-26 ENCOUNTER — Other Ambulatory Visit: Payer: Self-pay | Admitting: Family

## 2014-09-26 NOTE — Telephone Encounter (Signed)
Last filled: 04/05/14 Amt: 180, 1  Last OV:  04/05/14 Next appt:  10/07/14  Med filled x 1

## 2014-10-07 ENCOUNTER — Ambulatory Visit (INDEPENDENT_AMBULATORY_CARE_PROVIDER_SITE_OTHER): Payer: Medicare Other | Admitting: Family Medicine

## 2014-10-07 ENCOUNTER — Encounter: Payer: Self-pay | Admitting: Family Medicine

## 2014-10-07 VITALS — BP 130/70 | HR 62 | Temp 97.0°F | Resp 16 | Ht 60.0 in | Wt 124.5 lb

## 2014-10-07 DIAGNOSIS — R7303 Prediabetes: Secondary | ICD-10-CM

## 2014-10-07 DIAGNOSIS — I10 Essential (primary) hypertension: Secondary | ICD-10-CM | POA: Diagnosis not present

## 2014-10-07 DIAGNOSIS — R000 Tachycardia, unspecified: Secondary | ICD-10-CM | POA: Diagnosis not present

## 2014-10-07 DIAGNOSIS — R7309 Other abnormal glucose: Secondary | ICD-10-CM | POA: Diagnosis not present

## 2014-10-07 MED ORDER — METOPROLOL TARTRATE 25 MG PO TABS: ORAL_TABLET | ORAL | Status: DC

## 2014-10-07 MED ORDER — HYDROCHLOROTHIAZIDE 12.5 MG PO CAPS: 12.5000 mg | ORAL_CAPSULE | Freq: Every day | ORAL | Status: DC

## 2014-10-07 MED ORDER — METOPROLOL TARTRATE 25 MG PO TABS: 12.5000 mg | ORAL_TABLET | Freq: Two times a day (BID) | ORAL | Status: DC

## 2014-10-07 MED ORDER — LISINOPRIL 10 MG PO TABS: 10.0000 mg | ORAL_TABLET | Freq: Two times a day (BID) | ORAL | Status: DC

## 2014-10-07 MED ORDER — LISINOPRIL 10 MG PO TABS: 20.0000 mg | ORAL_TABLET | Freq: Every day | ORAL | Status: DC

## 2014-10-07 NOTE — Patient Instructions (Addendum)
Salon Pas gel or Aspercreme after 5 minutes of ice and apply twice daily Krill oil caps daily (MegaRed) Curcumen/Turmeric caps daily  64 oz of fluids or more daily Start a daily probiotic, Digestive Advantage or [employer] can get locally, [email or web address] company multiple strain probiotic  nasal saline after being outside and before bed Vinegar and lavendar Want BP<135/90, if higher restart HCTZ and let me know

## 2014-10-07 NOTE — Progress Notes (Signed)
Pre visit review using our clinic review tool, if applicable. No additional management support is needed unless otherwise documented below in the visit note. 

## 2014-10-07 NOTE — Assessment & Plan Note (Signed)
Well controlled, no changes to meds. Encouraged heart healthy diet such as the DASH diet and exercise as tolerated.  °

## 2014-10-17 ENCOUNTER — Encounter: Payer: Self-pay | Admitting: Family Medicine

## 2014-10-17 ENCOUNTER — Emergency Department (HOSPITAL_BASED_OUTPATIENT_CLINIC_OR_DEPARTMENT_OTHER)
Admission: EM | Admit: 2014-10-17 | Discharge: 2014-10-17 | Disposition: A | Discharge status: Home / Self Care | Payer: Medicare Other | Attending: Emergency Medicine | Admitting: Emergency Medicine

## 2014-10-17 ENCOUNTER — Encounter (HOSPITAL_BASED_OUTPATIENT_CLINIC_OR_DEPARTMENT_OTHER): Payer: Self-pay

## 2014-10-17 ENCOUNTER — Other Ambulatory Visit: Payer: Self-pay

## 2014-10-17 ENCOUNTER — Telehealth: Payer: Self-pay | Admitting: Family Medicine

## 2014-10-17 DIAGNOSIS — K529 Noninfective gastroenteritis and colitis, unspecified: Secondary | ICD-10-CM | POA: Diagnosis not present

## 2014-10-17 DIAGNOSIS — I10 Essential (primary) hypertension: Secondary | ICD-10-CM | POA: Diagnosis not present

## 2014-10-17 DIAGNOSIS — R1110 Vomiting, unspecified: Secondary | ICD-10-CM | POA: Diagnosis present

## 2014-10-17 DIAGNOSIS — Z79899 Other long term (current) drug therapy: Secondary | ICD-10-CM | POA: Insufficient documentation

## 2014-10-17 DIAGNOSIS — E785 Hyperlipidemia, unspecified: Secondary | ICD-10-CM

## 2014-10-17 DIAGNOSIS — Z880 Allergy status to penicillin: Secondary | ICD-10-CM | POA: Diagnosis not present

## 2014-10-17 DIAGNOSIS — Z7982 Long term (current) use of aspirin: Secondary | ICD-10-CM | POA: Diagnosis not present

## 2014-10-17 LAB — COMPREHENSIVE METABOLIC PANEL
ALT: 25 U/L (ref 0–35)
ANION GAP: 10 (ref 5–15)
AST: 25 U/L (ref 0–37)
Albumin: 4.6 g/dL (ref 3.5–5.2)
Alkaline Phosphatase: 53 U/L (ref 39–117)
BILIRUBIN TOTAL: 0.8 mg/dL (ref 0.3–1.2)
BUN: 26 mg/dL — AB (ref 6–23)
CALCIUM: 9.3 mg/dL (ref 8.4–10.5)
CHLORIDE: 104 mmol/L (ref 96–112)
CO2: 25 mmol/L (ref 19–32)
Creatinine, Ser: 0.68 mg/dL (ref 0.50–1.10)
GFR calc non Af Amer: 87 mL/min — ABNORMAL LOW (ref >90)
Glucose, Bld: 152 mg/dL — ABNORMAL HIGH (ref 70–99)
Potassium: 4.1 mmol/L (ref 3.5–5.1)
Sodium: 139 mmol/L (ref 135–145)
Total Protein: 7.7 g/dL (ref 6.0–8.3)

## 2014-10-17 LAB — CBC WITH DIFFERENTIAL/PLATELET
BASOS PCT: 0 % (ref 0–1)
Basophils Absolute: 0 10*3/uL (ref 0.0–0.1)
EOS ABS: 0 10*3/uL (ref 0.0–0.7)
Eosinophils Relative: 0 % (ref 0–5)
HEMATOCRIT: 42.7 % (ref 36.0–46.0)
HEMOGLOBIN: 14.7 g/dL (ref 12.0–15.0)
Lymphocytes Relative: 3 % — ABNORMAL LOW (ref 12–46)
Lymphs Abs: 0.4 10*3/uL — ABNORMAL LOW (ref 0.7–4.0)
MCH: 30.8 pg (ref 26.0–34.0)
MCHC: 34.4 g/dL (ref 30.0–36.0)
MCV: 89.3 fL (ref 78.0–100.0)
MONO ABS: 0.5 10*3/uL (ref 0.1–1.0)
MONOS PCT: 3 % (ref 3–12)
Neutro Abs: 14.2 10*3/uL — ABNORMAL HIGH (ref 1.7–7.7)
Neutrophils Relative %: 94 % — ABNORMAL HIGH (ref 43–77)
Platelets: 257 10*3/uL (ref 150–400)
RBC: 4.78 MIL/uL (ref 3.87–5.11)
RDW: 12.7 % (ref 11.5–15.5)
WBC: 15.1 10*3/uL — ABNORMAL HIGH (ref 4.0–10.5)

## 2014-10-17 LAB — LIPASE, BLOOD: Lipase: 22 U/L (ref 11–59)

## 2014-10-17 MED ORDER — ONDANSETRON HCL 4 MG/2ML IJ SOLN
4.0000 mg | Freq: Once | INTRAMUSCULAR | Status: AC
  Administered 2014-10-17: 4 mg via INTRAVENOUS
  Filled 2014-10-17: qty 2

## 2014-10-17 MED ORDER — ONDANSETRON 8 MG PO TBDP
ORAL_TABLET | ORAL | Status: DC
Start: 2014-10-17 — End: 2014-11-08

## 2014-10-17 MED ORDER — SODIUM CHLORIDE 0.9 % IV BOLUS (SEPSIS)
1000.0000 mL | Freq: Once | INTRAVENOUS | Status: AC
Start: 2014-10-17 — End: 2014-10-17
  Administered 2014-10-17: 1000 mL via INTRAVENOUS

## 2014-10-17 NOTE — ED Notes (Signed)
Reports n/v/d since 0500 this am. Unable to keep anything down. Reports some abdominal cramping.

## 2014-10-17 NOTE — Assessment & Plan Note (Signed)
hgba1c acceptable, minimize simple carbs. Increase exercise as tolerated.  

## 2014-10-17 NOTE — Discharge Instructions (Signed)
Zofran as prescribed as needed for nausea.  Clear liquids for the next 12 hours, then slowly advance diet to normal.  Return for abdominal pain, bloody stool, high fever, or other new and concerning symptoms.   Viral Gastroenteritis Viral gastroenteritis is also known as stomach flu. This condition affects the stomach and intestinal tract. It can cause sudden diarrhea and vomiting. The illness typically lasts 3 to 8 days. Most people develop an immune response that eventually gets rid of the virus. While this natural response develops, the virus can make you quite ill. CAUSES  Many different viruses can cause gastroenteritis, such as rotavirus or noroviruses. You can catch one of these viruses by consuming contaminated food or water. You may also catch a virus by sharing utensils or other personal items with an infected person or by touching a contaminated surface. SYMPTOMS  The most common symptoms are diarrhea and vomiting. These problems can cause a severe loss of body fluids (dehydration) and a body salt (electrolyte) imbalance. Other symptoms may include:  Fever.  Headache.  Fatigue.  Abdominal pain. DIAGNOSIS  Your caregiver can usually diagnose viral gastroenteritis based on your symptoms and a physical exam. A stool sample may also be taken to test for the presence of viruses or other infections. TREATMENT  This illness typically goes away on its own. Treatments are aimed at rehydration. The most serious cases of viral gastroenteritis involve vomiting so severely that you are not able to keep fluids down. In these cases, fluids must be given through an intravenous line (IV). HOME CARE INSTRUCTIONS   Drink enough fluids to keep your urine clear or pale yellow. Drink small amounts of fluids frequently and increase the amounts as tolerated.  Ask your caregiver for specific rehydration instructions.  Avoid:  Foods high in sugar.  Alcohol.  Carbonated  drinks.  Tobacco.  Juice.  Caffeine drinks.  Extremely hot or cold fluids.  Fatty, greasy foods.  Too much intake of anything at one time.  Dairy products until 24 to 48 hours after diarrhea stops.  You may consume probiotics. Probiotics are active cultures of beneficial bacteria. They may lessen the amount and number of diarrheal stools in adults. Probiotics can be found in yogurt with active cultures and in supplements.  Wash your hands well to avoid spreading the virus.  Only take over-the-counter or prescription medicines for pain, discomfort, or fever as directed by your caregiver. Do not give aspirin to children. Antidiarrheal medicines are not recommended.  Ask your caregiver if you should continue to take your regular prescribed and over-the-counter medicines.  Keep all follow-up appointments as directed by your caregiver. SEEK IMMEDIATE MEDICAL CARE IF:   You are unable to keep fluids down.  You do not urinate at least once every 6 to 8 hours.  You develop shortness of breath.  You notice blood in your stool or vomit. This may look like coffee grounds.  You have abdominal pain that increases or is concentrated in one small area (localized).  You have persistent vomiting or diarrhea.  You have a fever.  The patient is a child younger than 3 months, and he or she has a fever.  The patient is a child older than 3 months, and he or she has a fever and persistent symptoms.  The patient is a child older than 3 months, and he or she has a fever and symptoms suddenly get worse.  The patient is a baby, and he or she has no tears when  crying. °MAKE SURE YOU:  °· Understand these instructions. °· Will watch your condition. °· Will get help right away if you are not doing well or get worse. °Document Released: 07/05/2005 Document Revised: 09/27/2011 Document Reviewed: 04/21/2011 °ExitCare® Patient Information ©2015 [employer]. This information is not intended to replace  advice given to you by your health care provider. Make sure you discuss any questions you have with your health care provider. ° °

## 2014-10-17 NOTE — Progress Notes (Signed)
Eileen Hansen  [id number] 1945-07-21 10/17/2014      Progress Note-Follow Up  Subjective  Chief Complaint  Chief Complaint  Patient presents with  . Follow-up    6 month    HPI  Patient is a 69 y.o. female in today for routine medical care. Patient is in today for follow-up. Feeling well. No recent illness or acute concerns. Denies CP/palp/SOB/HA/congestion/fevers/GI or GU c/o. Taking meds as prescribed  Past Medical History  Diagnosis Date  . Allergic rhinitis   . HTN (hypertension)   . Tachycardia     Episodic    Past Surgical History  Procedure Laterality Date  . Bunionectomy    . Appendectomy  69 yrs old  . Wisdom tooth extraction  69 yrs old    Family History  Problem Relation Age of Onset  . Cancer Father     melanoma  . Colon cancer Neg Hx   . Breast cancer Neg Hx   . Coronary artery disease Neg Hx   . Diabetes Mother   . Hypertension Mother   . Hyperlipidemia Mother   . Stroke Mother   . Heart attack Mother   . Heart attack Brother   . Cancer Brother     pancreatic    History   Social History  . Marital Status: Married    Spouse Name: N/A  . Number of Children: 1  . Years of Education: N/A   Occupational History  . Homemaker    Social History Main Topics  . Smoking status: Never Smoker   . Smokeless tobacco: Never Used  . Alcohol Use: Yes     Comment: wine  . Drug Use: No  . Sexual Activity: Yes     Comment: lives with husband   Other Topics Concern  . Not on file   Social History Narrative   University - [place], Masters Degree - counseling, [employer]   Married '69 - 7 years, divorced; married '82   21 daughter - '72; 2 grandchildren      Regular Exercise -  YES          Current Outpatient Prescriptions on File Prior to Visit  Medication Sig Dispense Refill  . aspirin 81 MG tablet Take 81 mg by mouth daily.    Marland Kitchen b complex vitamins tablet Take 1 tablet by mouth daily.      . Cholecalciferol (VITAMIN D-3)  1000 UNITS CAPS Take 2,000 capsules by mouth daily.    . fish oil-omega-3 fatty acids 1000 MG capsule Take 1 g by mouth daily.       No current facility-administered medications on file prior to visit.    Allergies  Allergen Reactions  . Penicillins     REACTION: Hives Anaphylaxis    Review of Systems  Review of Systems  Constitutional: Negative for fever and malaise/fatigue.  HENT: Negative for congestion.   Eyes: Negative for discharge.  Respiratory: Negative for shortness of breath.   Cardiovascular: Negative for chest pain, palpitations and leg swelling.  Gastrointestinal: Negative for nausea, abdominal pain and diarrhea.  Genitourinary: Negative for dysuria.  Musculoskeletal: Negative for falls.  Skin: Negative for rash.  Neurological: Negative for loss of consciousness and headaches.  Endo/Heme/Allergies: Negative for polydipsia.  Psychiatric/Behavioral: Negative for depression and suicidal ideas. The patient is not nervous/anxious and does not have insomnia.     Objective  BP 130/70 mmHg  Pulse 62  Temp(Src) 97 F (36.1 C) (Oral)  Resp 16  Ht 5' (  1.524 m)  Wt 124 lb 8 oz (56.473 kg)  BMI 24.31 kg/m2  SpO2 97%  Physical Exam  Physical Exam  Constitutional: She is oriented to person, place, and time and well-developed, well-nourished, and in no distress. No distress.  HENT:  Head: Normocephalic and atraumatic.  Eyes: Conjunctivae are normal.  Neck: Neck supple. No thyromegaly present.  Cardiovascular: Normal rate, regular rhythm and normal heart sounds.   No murmur heard. Pulmonary/Chest: Effort normal and breath sounds normal. She has no wheezes.  Abdominal: She exhibits no distension and no mass.  Musculoskeletal: She exhibits no edema.  Lymphadenopathy:    She has no cervical adenopathy.  Neurological: She is alert and oriented to person, place, and time.  Skin: Skin is warm and dry. No rash noted. She is not diaphoretic.  Psychiatric: Memory, affect  and judgment normal.    Lab Results  Component Value Date   TSH 1.79 03/29/2011   Lab Results  Component Value Date   WBC 15.1* 10/17/2014   HGB 14.7 10/17/2014   HCT 42.7 10/17/2014   MCV 89.3 10/17/2014   PLT 257 10/17/2014   Lab Results  Component Value Date   CREATININE 0.68 10/17/2014   BUN 26* 10/17/2014   NA 139 10/17/2014   K 4.1 10/17/2014   CL 104 10/17/2014   CO2 25 10/17/2014   Lab Results  Component Value Date   ALT 25 10/17/2014   AST 25 10/17/2014   ALKPHOS 53 10/17/2014   BILITOT 0.8 10/17/2014   Lab Results  Component Value Date   CHOL 194 05/20/2014   Lab Results  Component Value Date   HDL 27.30* 05/20/2014   Lab Results  Component Value Date   LDLCALC 130* 03/29/2011   Lab Results  Component Value Date   TRIG 206.0* 05/20/2014   Lab Results  Component Value Date   CHOLHDL 7 05/20/2014     Assessment & Plan  Essential hypertension Well controlled, no changes to meds. Encouraged heart healthy diet such as the DASH diet and exercise as tolerated.    Borderline diabetes hgba1c acceptable, minimize simple carbs. Increase exercise as tolerated.   Tachycardia RRR today

## 2014-10-17 NOTE — Assessment & Plan Note (Signed)
RRR today 

## 2014-10-17 NOTE — ED Notes (Signed)
MD at bedside. 

## 2014-10-17 NOTE — Telephone Encounter (Signed)
Called patient to follow-up.  Patient stated she has continued to vomit and feels very weak.  She is not able to keep down any fluids.  She is not dizzy or shaky, but feels miserable.  Advised patient to have friend drive her to UC.  She stated understanding and agreed.

## 2014-10-17 NOTE — Telephone Encounter (Signed)
Patient Name: Donn PieriniJUANA Umeda DOB: January 17, 1946 Initial Comment Caller states vomiting and having diarrhea. Vomit is very yellow. Bp high 143/80. Been in 180's on top Nurse Assessment Nurse: Elijah Birkaldwell, RN, Stark BrayLynda Date/Time Lamount Cohen(Eastern Time): 10/17/2014 9:50:41 AM Confirm and document reason for call. If symptomatic, describe symptoms. ---Caller states vomiting and having watery diarrhea since early this am. Vomit is very yellow. BP high 174/89, 146/84, has been elevated. Normally lower, on BP rxs., recently made changes, stopped HCTZ, staggered the Lisinopril. Can keep water down. No fever. Has the patient traveled out of the country within the last 30 days? ---Not Applicable Does the patient require triage? ---Yes Related visit to physician within the last 2 weeks? ---Yes Does the PT have any chronic conditions? (i.e. diabetes, asthma, etc.) ---Yes List chronic conditions. ---on BP rx Guidelines Guideline Title Affirmed Question Affirmed Notes High Blood Pressure [1] BP # 140/90 AND [2] taking BP medications Final Disposition User See PCP When Office is Open (within 3 days) Elijah Birkaldwell, RN, [employer] Hotels[employer]da Comments Caller is most concerned about her elevated BP. Triaged for that & outcome upgraded. States she has thrown up yellow fluid, explained that it is stomach juice & that if that continues, to call back or be seen sooner at an UC/walk-in clinic.

## 2014-10-17 NOTE — ED Provider Notes (Signed)
CSN: [id number]     Arrival date & time 10/17/14  1655 History   First MD Initiated Contact with Patient 10/17/14 1748     Chief Complaint  Patient presents with  . Emesis     (Consider location/radiation/quality/duration/timing/severity/associated sxs/prior Treatment) HPI Comments: Patient is a 69 year old female with history of hypertension. She presents for evaluation of nausea, vomiting, diarrhea that started at approximately 5 AM. All has been nonbloody, nonbilious. She denies any fevers. She does report feeling weak and fatigued.  Patient is a 69 y.o. female presenting with vomiting. The history is provided by the patient.  Emesis Severity:  Moderate Duration:  12 hours Timing:  Constant Progression:  Unchanged Chronicity:  New Recent urination:  Decreased Relieved by:  Nothing Worsened by:  Nothing tried Ineffective treatments:  None tried Associated symptoms: no abdominal pain and no fever     Past Medical History  Diagnosis Date  . Allergic rhinitis   . HTN (hypertension)   . Tachycardia     Episodic   Past Surgical History  Procedure Laterality Date  . Bunionectomy    . Appendectomy  69 yrs old  . Wisdom tooth extraction  69 yrs old   Family History  Problem Relation Age of Onset  . Cancer Father     melanoma  . Colon cancer Neg Hx   . Breast cancer Neg Hx   . Coronary artery disease Neg Hx   . Diabetes Mother   . Hypertension Mother   . Hyperlipidemia Mother   . Stroke Mother   . Heart attack Mother   . Heart attack Brother   . Cancer Brother     pancreatic   History  Substance Use Topics  . Smoking status: Never Smoker   . Smokeless tobacco: Never Used  . Alcohol Use: Yes     Comment: wine   OB History    Gravida Para Term Preterm AB TAB SAB Ectopic Multiple Living   1 1             Review of Systems  Gastrointestinal: Positive for vomiting. Negative for abdominal pain.  All other systems reviewed and are negative.     Allergies   Penicillins  Home Medications   Prior to Admission medications   Medication Sig Start Date End Date Taking? Authorizing Provider  aspirin 81 MG tablet Take 81 mg by mouth daily.    Historical Provider, MD  b complex vitamins tablet Take 1 tablet by mouth daily.      Historical Provider, MD  Calcium Carbonate-Vitamin D (CALTRATE 600+D PO) Take by mouth.    Historical Provider, MD  Cholecalciferol (VITAMIN D-3) 1000 UNITS CAPS Take 2,000 capsules by mouth daily.    Historical Provider, MD  fish oil-omega-3 fatty acids 1000 MG capsule Take 1 g by mouth daily.      Historical Provider, MD  lisinopril (PRINIVIL,ZESTRIL) 10 MG tablet Take 1 tablet (10 mg total) by mouth 2 (two) times daily. 10/07/14   Bradd CanaryStacey A Blyth, MD  metoprolol tartrate (LOPRESSOR) 25 MG tablet Take 0.5 tablets (12.5 mg total) by mouth 2 (two) times daily. 10/07/14   Bradd CanaryStacey A Blyth, MD  Multiple Vitamin (MULTIVITAMIN) tablet Take 1 tablet by mouth daily. Nature's Brand    Historical Provider, MD   BP 148/72 mmHg  Pulse 84  Temp(Src) 99.4 F (37.4 C) (Oral)  Resp 18  Ht 5' (1.524 m)  Wt 125 lb (56.7 kg)  BMI 24.41 kg/m2  SpO2 95% Physical Exam  Constitutional: She is oriented to person, place, and time. She appears well-developed and well-nourished. No distress.  HENT:  Head: Normocephalic and atraumatic.  Mouth/Throat: Oropharynx is clear and moist.  Neck: Normal range of motion. Neck supple.  Cardiovascular: Normal rate and regular rhythm.  Exam reveals no gallop and no friction rub.   No murmur heard. Pulmonary/Chest: Effort normal and breath sounds normal. No respiratory distress. She has no wheezes.  Abdominal: Soft. Bowel sounds are normal. She exhibits no distension. There is no tenderness.  Musculoskeletal: Normal range of motion. She exhibits no edema.  Neurological: She is alert and oriented to person, place, and time.  Skin: Skin is warm and dry. She is not diaphoretic.  Nursing note and vitals  reviewed.   ED Course  Procedures (including critical care time) Labs Review Labs Reviewed  COMPREHENSIVE METABOLIC PANEL  LIPASE, BLOOD  CBC WITH DIFFERENTIAL/PLATELET    Imaging Review No results found.   EKG Interpretation None      MDM   Final diagnoses:  None   Patient's presentation, exam, and workup are consistent with an acute gastroenteritis. She is feeling better with IV fluids and nausea medications. She will be discharged with Zofran and when necessary return.    Geoffery Lyons, MD 10/17/14 2000

## 2014-10-18 ENCOUNTER — Ambulatory Visit: Payer: Self-pay | Admitting: Internal Medicine

## 2014-10-22 ENCOUNTER — Other Ambulatory Visit: Payer: Self-pay | Admitting: Family Medicine

## 2014-10-23 ENCOUNTER — Telehealth: Payer: Self-pay | Admitting: Family Medicine

## 2014-10-23 ENCOUNTER — Encounter: Payer: Self-pay | Admitting: Internal Medicine

## 2014-10-23 NOTE — Telephone Encounter (Signed)
Called patient and she stated that she made the appointment when she was feeling bad, but then went to ED instead  on 3/31.  She is feeling much better and all of her symptoms have improved.

## 2014-10-23 NOTE — Telephone Encounter (Signed)
PT was no show for ED follow up on 10/18/14- letter sent- routing to Dianna LimboAshley Larson to follow up- charge PT?

## 2014-10-25 NOTE — Telephone Encounter (Signed)
No, no show fee. Inform Pt to F/U with Dr. Abner GreenspanBlyth for ED F/U.

## 2014-11-08 ENCOUNTER — Encounter: Payer: Self-pay | Admitting: Medical

## 2014-11-08 ENCOUNTER — Ambulatory Visit (HOSPITAL_BASED_OUTPATIENT_CLINIC_OR_DEPARTMENT_OTHER)
Admission: RE | Admit: 2014-11-08 | Discharge: 2014-11-08 | Disposition: A | Discharge status: Home / Self Care | Payer: Medicare Other | Source: Ambulatory Visit | Attending: Medical | Admitting: Medical

## 2014-11-08 ENCOUNTER — Other Ambulatory Visit (HOSPITAL_BASED_OUTPATIENT_CLINIC_OR_DEPARTMENT_OTHER): Payer: Medicare Other

## 2014-11-08 ENCOUNTER — Ambulatory Visit (INDEPENDENT_AMBULATORY_CARE_PROVIDER_SITE_OTHER): Payer: Medicare Other | Admitting: Medical

## 2014-11-08 VITALS — BP 140/80 | HR 73 | Temp 97.9°F | Ht 60.0 in | Wt 124.8 lb

## 2014-11-08 DIAGNOSIS — H5712 Ocular pain, left eye: Secondary | ICD-10-CM | POA: Insufficient documentation

## 2014-11-08 DIAGNOSIS — R200 Anesthesia of skin: Secondary | ICD-10-CM | POA: Insufficient documentation

## 2014-11-08 DIAGNOSIS — G459 Transient cerebral ischemic attack, unspecified: Secondary | ICD-10-CM

## 2014-11-08 DIAGNOSIS — R202 Paresthesia of skin: Secondary | ICD-10-CM | POA: Diagnosis not present

## 2014-11-08 NOTE — Progress Notes (Signed)
Pre visit review using our clinic review tool, if applicable. No additional management support is needed unless otherwise documented below in the visit note. 

## 2014-11-08 NOTE — Assessment & Plan Note (Addendum)
Of left side of face 3 wks ago for one hour. But resolved quickly. Does not fit exactly into Bells palsy, trigeminal neuralagia, or shingles etc.   Possible tia history. So will try to get carotid dopplers today and order ct of head without contrast non emergent. Discussed with pt that if she get recurrent symptoms as discussed then ED evaluation.  Follow up in 2 wks or as needed.

## 2014-11-08 NOTE — Progress Notes (Signed)
Subjective:    Patient ID: Eileen Hansen, female    DOB: 02/05/1946, 69 y.o.   MRN: [id number]  HPI  Pt in states 3 wks ago. She woke up with tingling and numbing pain. Half of her face felt numb and tingling but she could move face well. Felt like this for one hour. Symptoms did resolve completely. Pt called here and given appoitment May 5th. Pt saw opthalmologist(Dr. Hyacinth MeekerMiller) last week on Monday. Pt had her left eye examined  and she had a dilated exam. Pt told eye was ok. Although faint low level pain in eye at night. Pt discussed this in depth with opthalmolgist. Not definitie etiology found. Not any acute eye pain now.   Pt has no  further numbness. No burning or stabbing pain to face at time numbness. No vision changes. No new rash to skin but some lt side rosacea hx.  Pt blood pressure is fairly well  Controlled/ borderline.    Review of Systems  Constitutional: Negative for fever, chills, diaphoresis, activity change and fatigue.  Respiratory: Negative for cough, chest tightness and shortness of breath.   Cardiovascular: Negative for chest pain, palpitations and leg swelling.  Gastrointestinal: Negative for nausea, vomiting and abdominal pain.  Musculoskeletal: Negative for neck pain and neck stiffness.  Neurological: Negative for dizziness, tremors, seizures, syncope, facial asymmetry, speech difficulty, weakness, light-headedness, numbness and headaches.       None now but states 2 wks ago for one hour entire left side of face had no sensation.  Psychiatric/Behavioral: Negative for behavioral problems, confusion and agitation. The patient is not nervous/anxious.    Past Medical History  Diagnosis Date  . Allergic rhinitis   . HTN (hypertension)   . Tachycardia     Episodic    History   Social History  . Marital Status: Married    Spouse Name: N/A  . Number of Children: 1  . Years of Education: N/A   Occupational History  . Homemaker    Social History Main Topics    . Smoking status: Never Smoker   . Smokeless tobacco: Never Used  . Alcohol Use: Yes     Comment: wine  . Drug Use: No  . Sexual Activity: Yes     Comment: lives with husband   Other Topics Concern  . Not on file   Social History Narrative   University - [place], Masters Degree - counseling, [employer]   Married '69 - 7 years, divorced; married '82   21 daughter - '72; 2 grandchildren      Regular Exercise -  YES          Past Surgical History  Procedure Laterality Date  . Bunionectomy    . Appendectomy  69 yrs old  . Wisdom tooth extraction  69 yrs old    Family History  Problem Relation Age of Onset  . Cancer Father     melanoma  . Colon cancer Neg Hx   . Breast cancer Neg Hx   . Coronary artery disease Neg Hx   . Diabetes Mother   . Hypertension Mother   . Hyperlipidemia Mother   . Stroke Mother   . Heart attack Mother   . Heart attack Brother   . Cancer Brother     pancreatic    Allergies  Allergen Reactions  . Penicillins     REACTION: Hives Anaphylaxis    Current Outpatient Prescriptions on File Prior to Visit  Medication Sig Dispense  Refill  . aspirin 81 MG tablet Take 81 mg by mouth daily.    Marland Kitchen b complex vitamins tablet Take 1 tablet by mouth daily.      . Calcium Carbonate-Vitamin D (CALTRATE 600+D PO) Take by mouth.    . fish oil-omega-3 fatty acids 1000 MG capsule Take 1 g by mouth daily.      Marland Kitchen lisinopril (PRINIVIL,ZESTRIL) 10 MG tablet Take 1 tablet (10 mg total) by mouth 2 (two) times daily. 180 tablet 1  . metoprolol tartrate (LOPRESSOR) 25 MG tablet Take 0.5 tablets (12.5 mg total) by mouth 2 (two) times daily. 90 tablet 1  . Multiple Vitamin (MULTIVITAMIN) tablet Take 1 tablet by mouth daily. Nature's Brand     No current facility-administered medications on file prior to visit.    BP 140/80 mmHg  Pulse 73  Temp(Src) 97.9 F (36.6 C) (Oral)  Ht 5' (1.524 m)  Wt 124 lb 12.8 oz (56.609 kg)  BMI 24.37 kg/m2  SpO2  96%       Objective:   Physical Exam   General Mental Status- Alert. General Appearance- Not in acute distress.   Skin General: Color- Normal Color. Moisture- Normal Moisture.  Neck Carotid Arteries- Normal color. Moisture- Normal Moisture. No carotid bruits. No JVD.  Chest and Lung Exam Auscultation: Breath Sounds:-Normal.  Cardiovascular Auscultation:Rythm- Regular. Murmurs & Other Heart Sounds:Auscultation of the heart reveals- No Murmurs.  Abdomen Inspection:-Inspeection Normal. Palpation/Percussion:Note:No mass. Palpation and Percussion of the abdomen reveal- Non Tender, Non Distended + BS, no rebound or guarding.    Neurologic Cranial Nerve exam:- CN III-XII intact(No nystagmus), symmetric smile.(normal sensation to face) Symmetric smile Drift Test:- No drift. Romberg Exam:- Negative.  Heal to Toe Gait exam:-Normal. Finger to Nose:- Normal/Intact Strength:- 5/5 equal and symmetric strength both upper and lower extremities.  Skin- no new changes on her skin. Faint rosacea on left side. No vesicles.     Assessment & Plan:

## 2014-11-08 NOTE — Patient Instructions (Addendum)
Paresthesia Of left side of face 3 wks ago for one hour. But resolved quickly. Does not fit exactly into Bells palsy, trigeminal neuralagia, or shingles etc.   Possible tia history. So will try to get carotid dopplers today and order ct of head without contrast non emergent. Discussed with pt that if she get recurrent symptoms as discussed then ED evaluation.  Follow up in 2 wks or as needed.    On follow up check bp and come in fasting.  Note regarding eye pain. I am not sure what to make of this particularly since opthalmologist examined and per pt definitive cause not found.  If this were to worse or other eye symptoms would refer back to her opthalmologist.

## 2014-11-12 NOTE — Telephone Encounter (Signed)
Called pt on her carotid studies and ct of head. Will put in fasting future lipid panel. Advised pt will try to manage risk factors. If cholesterol worse may put on on low dose statin.

## 2014-11-13 ENCOUNTER — Other Ambulatory Visit: Payer: Medicare Other

## 2014-11-14 ENCOUNTER — Other Ambulatory Visit (INDEPENDENT_AMBULATORY_CARE_PROVIDER_SITE_OTHER): Payer: Medicare Other

## 2014-11-14 DIAGNOSIS — E785 Hyperlipidemia, unspecified: Secondary | ICD-10-CM

## 2014-11-14 LAB — LIPID PANEL
Cholesterol: 155 mg/dL (ref 0–200)
HDL: 29.1 mg/dL — ABNORMAL LOW (ref >39.00)
LDL Cholesterol: 95 mg/dL (ref 0–99)
NonHDL: 125.9
TRIGLYCERIDES: 154 mg/dL — AB (ref 0.0–149.0)
Total CHOL/HDL Ratio: 5
VLDL: 30.8 mg/dL (ref 0.0–40.0)

## 2014-11-14 LAB — COMPREHENSIVE METABOLIC PANEL
ALBUMIN: 4.3 g/dL (ref 3.5–5.2)
ALT: 18 U/L (ref 0–35)
AST: 20 U/L (ref 0–37)
Alkaline Phosphatase: 62 U/L (ref 39–117)
BILIRUBIN TOTAL: 0.4 mg/dL (ref 0.2–1.2)
BUN: 19 mg/dL (ref 6–23)
CALCIUM: 9.6 mg/dL (ref 8.4–10.5)
CHLORIDE: 105 meq/L (ref 96–112)
CO2: 27 meq/L (ref 19–32)
Creatinine, Ser: 0.7 mg/dL (ref 0.40–1.20)
GFR: 87.75 mL/min (ref >60.00)
GLUCOSE: 93 mg/dL (ref 70–99)
Potassium: 4.1 mEq/L (ref 3.5–5.1)
SODIUM: 140 meq/L (ref 135–145)
Total Protein: 6.8 g/dL (ref 6.0–8.3)

## 2014-11-21 ENCOUNTER — Ambulatory Visit: Payer: Medicare Other | Admitting: Family Medicine

## 2014-11-26 ENCOUNTER — Encounter: Payer: Self-pay | Admitting: Family Medicine

## 2015-01-21 ENCOUNTER — Encounter: Payer: Self-pay | Admitting: Gastroenterology

## 2015-02-14 ENCOUNTER — Encounter: Payer: Self-pay | Admitting: Family Medicine

## 2015-02-14 ENCOUNTER — Ambulatory Visit (INDEPENDENT_AMBULATORY_CARE_PROVIDER_SITE_OTHER): Payer: Medicare Other | Admitting: Family Medicine

## 2015-02-14 VITALS — BP 136/78 | HR 66 | Temp 98.0°F | Resp 16 | Ht 60.0 in | Wt 125.2 lb

## 2015-02-14 DIAGNOSIS — R109 Unspecified abdominal pain: Secondary | ICD-10-CM | POA: Diagnosis not present

## 2015-02-14 DIAGNOSIS — F458 Other somatoform disorders: Secondary | ICD-10-CM

## 2015-02-14 DIAGNOSIS — R7309 Other abnormal glucose: Secondary | ICD-10-CM | POA: Diagnosis not present

## 2015-02-14 DIAGNOSIS — I10 Essential (primary) hypertension: Secondary | ICD-10-CM | POA: Diagnosis not present

## 2015-02-14 DIAGNOSIS — R739 Hyperglycemia, unspecified: Secondary | ICD-10-CM

## 2015-02-14 DIAGNOSIS — E782 Mixed hyperlipidemia: Secondary | ICD-10-CM | POA: Diagnosis not present

## 2015-02-14 DIAGNOSIS — R000 Tachycardia, unspecified: Secondary | ICD-10-CM

## 2015-02-14 DIAGNOSIS — R0989 Other specified symptoms and signs involving the circulatory and respiratory systems: Secondary | ICD-10-CM

## 2015-02-14 DIAGNOSIS — R7303 Prediabetes: Secondary | ICD-10-CM

## 2015-02-14 DIAGNOSIS — K5900 Constipation, unspecified: Secondary | ICD-10-CM

## 2015-02-14 LAB — URINALYSIS
Bilirubin Urine: NEGATIVE
Hgb urine dipstick: NEGATIVE
Ketones, ur: NEGATIVE
Leukocytes, UA: NEGATIVE
NITRITE: NEGATIVE
SPECIFIC GRAVITY, URINE: 1.01 (ref 1.000–1.030)
Total Protein, Urine: NEGATIVE
Urine Glucose: NEGATIVE
Urobilinogen, UA: 0.2 (ref 0.0–1.0)
pH: 7 (ref 5.0–8.0)

## 2015-02-14 LAB — CBC
HCT: 41.7 % (ref 36.0–46.0)
HEMOGLOBIN: 14.3 g/dL (ref 12.0–15.0)
MCHC: 34.4 g/dL (ref 30.0–36.0)
MCV: 88.6 fl (ref 78.0–100.0)
PLATELETS: 277 10*3/uL (ref 150.0–400.0)
RBC: 4.71 Mil/uL (ref 3.87–5.11)
RDW: 12.8 % (ref 11.5–15.5)
WBC: 7.4 10*3/uL (ref 4.0–10.5)

## 2015-02-14 LAB — LIPID PANEL
CHOL/HDL RATIO: 6
Cholesterol: 197 mg/dL (ref 0–200)
HDL: 31.5 mg/dL — ABNORMAL LOW (ref >39.00)
NONHDL: 165.83
TRIGLYCERIDES: 263 mg/dL — AB (ref 0.0–149.0)
VLDL: 52.6 mg/dL — ABNORMAL HIGH (ref 0.0–40.0)

## 2015-02-14 LAB — SEDIMENTATION RATE: SED RATE: 19 mm/h (ref 0–22)

## 2015-02-14 LAB — T4, FREE: Free T4: 0.94 ng/dL (ref 0.60–1.60)

## 2015-02-14 LAB — HEMOGLOBIN A1C: Hgb A1c MFr Bld: 5.8 % (ref 4.6–6.5)

## 2015-02-14 LAB — LDL CHOLESTEROL, DIRECT: Direct LDL: 118 mg/dL

## 2015-02-14 LAB — TSH: TSH: 1.82 u[IU]/mL (ref 0.35–4.50)

## 2015-02-14 NOTE — Progress Notes (Signed)
Eileen Hansen  [id number] 1945/08/20 02/14/2015      Progress Note-Follow Up  Subjective  Chief Complaint  Chief Complaint  Patient presents with  . Follow-up    parasthesia, states she doesn't have it as much, but feels it from time to time     HPI  Patient is a 69 y.o. female in today for routine medical care. Patient is in toda for follow-upis feeling somewhat improved.Had a fall back in June where she injured her face and her back but is improving. Her weakness and pain are improving. She has infrequent pain n the left lower quadrant but no change in bowel habits or nausea.Is complaining of globus but acknowledges congestion and poor hydration. Denies CP/palp/SOB/HA/congestion/fevers/GI or GU c/o. Taking meds as prescribed  Past Medical History  Diagnosis Date  . Allergic rhinitis   . HTN (hypertension)   . Tachycardia     Episodic    Past Surgical History  Procedure Laterality Date  . Bunionectomy    . Appendectomy  69 yrs old  . Wisdom tooth extraction  69 yrs old    Family History  Problem Relation Age of Onset  . Cancer Father     melanoma  . Colon cancer Neg Hx   . Breast cancer Neg Hx   . Coronary artery disease Neg Hx   . Diabetes Mother   . Hypertension Mother   . Hyperlipidemia Mother   . Stroke Mother   . Heart attack Mother   . Heart attack Brother   . Cancer Brother     pancreatic    History   Social History  . Marital Status: Married    Spouse Name: N/A  . Number of Children: 1  . Years of Education: N/A   Occupational History  . Homemaker    Social History Main Topics  . Smoking status: Never Smoker   . Smokeless tobacco: Never Used  . Alcohol Use: Yes     Comment: wine  . Drug Use: No  . Sexual Activity: Yes     Comment: lives with husband   Other Topics Concern  . Not on file   Social History Narrative   University - [place], Masters Degree - counseling, [employer]   Married '69 - 7 years, divorced; married  '82   21 daughter - '72; 2 grandchildren      Regular Exercise -  YES          Current Outpatient Prescriptions on File Prior to Visit  Medication Sig Dispense Refill  . aspirin 81 MG tablet Take 81 mg by mouth daily.    Marland Kitchen b complex vitamins tablet Take 1 tablet by mouth daily.      . Calcium Carbonate-Vitamin D (CALTRATE 600+D PO) Take by mouth.    Marland Kitchen lisinopril (PRINIVIL,ZESTRIL) 10 MG tablet Take 1 tablet (10 mg total) by mouth 2 (two) times daily. 180 tablet 1  . metoprolol tartrate (LOPRESSOR) 25 MG tablet Take 0.5 tablets (12.5 mg total) by mouth 2 (two) times daily. 90 tablet 1  . Multiple Vitamin (MULTIVITAMIN) tablet Take 1 tablet by mouth daily. Nature's Brand     No current facility-administered medications on file prior to visit.    Allergies  Allergen Reactions  . Penicillins     REACTION: Hives Anaphylaxis    Review of Systems  Review of Systems  Constitutional: Negative for fever, chills and malaise/fatigue.  HENT: Positive for congestion. Negative for hearing loss and nosebleeds.  Eyes: Negative for discharge.  Respiratory: Negative for cough, sputum production, shortness of breath and wheezing.   Cardiovascular: Negative for chest pain, palpitations and leg swelling.  Gastrointestinal: Positive for abdominal pain. Negative for heartburn, nausea, vomiting, diarrhea, constipation and blood in stool.  Genitourinary: Negative for dysuria, urgency, frequency and hematuria.  Musculoskeletal: Positive for back pain and joint pain. Negative for myalgias and falls.  Skin: Negative for rash.  Neurological: Negative for dizziness, tremors, sensory change, focal weakness, loss of consciousness, weakness and headaches.  Endo/Heme/Allergies: Negative for polydipsia. Does not bruise/bleed easily.  Psychiatric/Behavioral: Negative for depression and suicidal ideas. The patient is not nervous/anxious and does not have insomnia.     Objective  BP 136/78 mmHg  Pulse 66   Temp(Src) 98 F (36.7 C) (Oral)  Resp 16  Ht 5' (1.524 m)  Wt 125 lb 3.2 oz (56.79 kg)  BMI 24.45 kg/m2  SpO2 95%  Physical Exam  Physical Exam  Constitutional: She is oriented to person, place, and time and well-developed, well-nourished, and in no distress. No distress.  HENT:  Head: Normocephalic and atraumatic.  Right Ear: External ear normal.  Left Ear: External ear normal.  Nose: Nose normal.  Mouth/Throat: Oropharynx is clear and moist. No oropharyngeal exudate.  Eyes: Conjunctivae are normal. Pupils are equal, round, and reactive to light. Right eye exhibits no discharge. Left eye exhibits no discharge. No scleral icterus.  Neck: Normal range of motion. Neck supple. No thyromegaly present.  Cardiovascular: Normal rate, regular rhythm, normal heart sounds and intact distal pulses.   No murmur heard. Pulmonary/Chest: Effort normal and breath sounds normal. No respiratory distress. She has no wheezes. She has no rales.  Abdominal: Soft. Bowel sounds are normal. She exhibits no distension and no mass. There is no tenderness.  Musculoskeletal: Normal range of motion. She exhibits no edema or tenderness.  Lymphadenopathy:    She has no cervical adenopathy.  Neurological: She is alert and oriented to person, place, and time. She has normal reflexes. No cranial nerve deficit. Coordination normal.  Skin: Skin is warm and dry. No rash noted. She is not diaphoretic.  Psychiatric: Mood, memory, affect and judgment normal.    Lab Results  Component Value Date   TSH 1.79 03/29/2011   Lab Results  Component Value Date   WBC 15.1* 10/17/2014   HGB 14.7 10/17/2014   HCT 42.7 10/17/2014   MCV 89.3 10/17/2014   PLT 257 10/17/2014   Lab Results  Component Value Date   CREATININE 0.70 11/14/2014   BUN 19 11/14/2014   NA 140 11/14/2014   K 4.1 11/14/2014   CL 105 11/14/2014   CO2 27 11/14/2014   Lab Results  Component Value Date   ALT 18 11/14/2014   AST 20 11/14/2014    ALKPHOS 62 11/14/2014   BILITOT 0.4 11/14/2014   Lab Results  Component Value Date   CHOL 155 11/14/2014   Lab Results  Component Value Date   HDL 29.10* 11/14/2014   Lab Results  Component Value Date   LDLCALC 95 11/14/2014   Lab Results  Component Value Date   TRIG 154.0* 11/14/2014   Lab Results  Component Value Date   CHOLHDL 5 11/14/2014     Assessment & Plan  Tachycardia RRR today  Hyperlipidemia, mixed Encouraged heart healthy diet, increase exercise, avoid trans fats, consider a krill oil cap daily  Essential hypertension Well controlled, no changes to meds. Encouraged heart healthy diet such as the DASH diet and  exercise as tolerated.   Borderline diabetes hgba1c acceptable, minimize simple carbs. Increase exercise as tolerated. Continue current meds  Constipation Encouraged increased hydration and fiber in diet. Daily probiotics. If bowels not moving can use MOM 2 tbls po in 4 oz of warm prune juice by mouth every 2-3 days. If no results then repeat in 4 hours with  Dulcolax suppository pr, may repeat again in 4 more hours as needed. Seek care if symptoms worsen. Consider daily Miralax and/or Dulcolax if symptoms persist.   Globus sensation Likely multifactorial, encouraged daily antihistamines, increase hydration and acid suppression. If no improvement patient will let us know

## 2015-02-14 NOTE — Progress Notes (Signed)
Pre visit review using our clinic review tool, if applicable. No additional management support is needed unless otherwise documented below in the visit note. 

## 2015-02-14 NOTE — Patient Instructions (Addendum)
64 oz fluids daily Probiotic daily such as Digestive Advantage or [employer] Health are over the counter or online NOW company 10 strain probiotics daily can order at [employer].  Globus Syndrome Globus Syndrome is a feeling of a lump or a sensation of something caught in your throat. Eating food or drinking fluids does not seem to get rid of it. Yet it is not noticeable during the actual act of swallowing food or liquids. Usually there is nothing physically wrong. It is troublesome because it is an unpleasant sensation which is sometimes difficult to ignore and at times may seem to worsen. The syndrome is quite common. It is estimated 45% of the population experiences features of the condition at some stage during their lives. The symptoms are usually temporary. The largest group of people who feel the need to seek medical treatment is females between the ages of 30 to 60.  CAUSES  Globus Syndrome appears to be triggered by or aggravated by stress, anxiety and depression. Tension related to stress could product abnormal muscle spasms in the esophagus which would account for the sensation of a lump or ball in your throat. Frequent swallowing or drying of the throat caused by anxiety or other strong emotions can also produce this uncomfortable sensation in your throat. Fear and sadness can be expressed by the body in many ways. For instance, if you had a relative with throat cancer you might become overly concerned about your own health and develop uncomfortable sensations in your throat. The reaction to a crisis or a trauma event in your life can take the form of a lump in your throat. It is as if you are indirectly saying you can not handle or "swallow" one more thing. DIAGNOSIS  Usually your caregiver will know what is wrong by talking to you and examining you. If the condition persists for several days, more testing may be done to make sure there is not another problem present. This is  usually not the case. TREATMENT  Reassurance is often the best treatment available. Usually the problem leaves without treatment over several days. Sometimes anti-anxiety medications may be prescribed. Counseling or talk therapy can also help with strong underlying emotions. Note that in most cases this is not something that keeps coming back and you should not be concerned or worried. Document Released: 09/25/2003 Document Revised: 09/27/2011 Document Reviewed: 02/22/2008 [facility] Patient Information 2015 [place], [place]. This information is not intended to replace advice given to you by your health care provider. Make sure you discuss any questions you have with your health care provider.

## 2015-02-15 LAB — URINE CULTURE
Colony Count: NO GROWTH
Organism ID, Bacteria: NO GROWTH

## 2015-02-23 ENCOUNTER — Encounter: Payer: Self-pay | Admitting: Family Medicine

## 2015-02-23 DIAGNOSIS — R0989 Other specified symptoms and signs involving the circulatory and respiratory systems: Secondary | ICD-10-CM | POA: Insufficient documentation

## 2015-02-23 DIAGNOSIS — R198 Other specified symptoms and signs involving the digestive system and abdomen: Secondary | ICD-10-CM

## 2015-02-23 HISTORY — DX: Other specified symptoms and signs involving the digestive system and abdomen: R19.8

## 2015-02-23 NOTE — Assessment & Plan Note (Signed)
hgba1c acceptable, minimize simple carbs. Increase exercise as tolerated. Continue current meds 

## 2015-02-23 NOTE — Assessment & Plan Note (Signed)
Encouraged increased hydration and fiber in diet. Daily probiotics. If bowels not moving can use MOM 2 tbls po in 4 oz of warm prune juice by mouth every 2-3 days. If no results then repeat in 4 hours with  Dulcolax suppository pr, may repeat again in 4 more hours as needed. Seek care if symptoms worsen. Consider daily Miralax and/or Dulcolax if symptoms persist.  

## 2015-02-23 NOTE — Assessment & Plan Note (Signed)
Likely multifactorial, encouraged daily antihistamines, increase hydration and acid suppression. If no improvement patient will let us know

## 2015-02-23 NOTE — Assessment & Plan Note (Signed)
Well controlled, no changes to meds. Encouraged heart healthy diet such as the DASH diet and exercise as tolerated.  °

## 2015-02-23 NOTE — Assessment & Plan Note (Signed)
RRR today 

## 2015-02-23 NOTE — Assessment & Plan Note (Signed)
Encouraged heart healthy diet, increase exercise, avoid trans fats, consider a krill oil cap daily 

## 2015-04-14 ENCOUNTER — Encounter: Payer: Self-pay | Admitting: Family Medicine

## 2015-04-14 ENCOUNTER — Other Ambulatory Visit: Payer: Self-pay | Admitting: Family Medicine

## 2015-04-14 ENCOUNTER — Ambulatory Visit (INDEPENDENT_AMBULATORY_CARE_PROVIDER_SITE_OTHER): Payer: Medicare Other | Admitting: Family Medicine

## 2015-04-14 VITALS — BP 130/71 | HR 68 | Temp 97.0°F | Ht 60.0 in | Wt 126.0 lb

## 2015-04-14 DIAGNOSIS — Z0000 Encounter for general adult medical examination without abnormal findings: Secondary | ICD-10-CM

## 2015-04-14 DIAGNOSIS — R0989 Other specified symptoms and signs involving the circulatory and respiratory systems: Secondary | ICD-10-CM

## 2015-04-14 DIAGNOSIS — L578 Other skin changes due to chronic exposure to nonionizing radiation: Secondary | ICD-10-CM

## 2015-04-14 DIAGNOSIS — F458 Other somatoform disorders: Secondary | ICD-10-CM | POA: Diagnosis not present

## 2015-04-14 DIAGNOSIS — I10 Essential (primary) hypertension: Secondary | ICD-10-CM | POA: Diagnosis not present

## 2015-04-14 DIAGNOSIS — R7303 Prediabetes: Secondary | ICD-10-CM

## 2015-04-14 DIAGNOSIS — R739 Hyperglycemia, unspecified: Secondary | ICD-10-CM

## 2015-04-14 DIAGNOSIS — Z23 Encounter for immunization: Secondary | ICD-10-CM

## 2015-04-14 DIAGNOSIS — E782 Mixed hyperlipidemia: Secondary | ICD-10-CM | POA: Diagnosis not present

## 2015-04-14 HISTORY — DX: Encounter for general adult medical examination without abnormal findings: Z00.00

## 2015-04-14 NOTE — Assessment & Plan Note (Addendum)
Patient denies any difficulties at home. No trouble with ADLs, depression or falls. No recent changes to vision or hearing. Is UTD with immunizations. Is UTD with screening. Discussed Advanced Directives.. Encouraged heart healthy diet, exercise as tolerated and adequate sleep. Labs reviewed.  Sees Dr Christella Hartigan, gastroenterology, colonoscopy 2014, likely to be her last one Sees Dr Henderson [facility] for paps and [facility]  See problem list for risk factors See AVS for preventative health recommendations

## 2015-04-14 NOTE — Progress Notes (Deleted)
Patient ID: Eileen Hansen, female   DOB: Mar 26, 1946, 69 y.o.   MRN: [id number]   Subjective:    Patient ID: Eileen Hansen, female    DOB: [birth year], 69 y.o.   MRN: [id number]  Chief Complaint  Patient presents with  . Medicare Wellness    HPI Patient is in today for ***  Past Medical History  Diagnosis Date  . Allergic rhinitis   . HTN (hypertension)   . Tachycardia     Episodic  . Globus sensation 02/23/2015    Past Surgical History  Procedure Laterality Date  . Bunionectomy    . Appendectomy  69 yrs old  . Wisdom tooth extraction  69 yrs old    Family History  Problem Relation Age of Onset  . Cancer Father     melanoma  . Colon cancer Neg Hx   . Breast cancer Neg Hx   . Coronary artery disease Neg Hx   . Diabetes Mother   . Hypertension Mother   . Hyperlipidemia Mother   . Stroke Mother   . Heart attack Mother   . Heart attack Brother   . Cancer Brother     pancreatic    Social History   Social History  . Marital Status: Married    Spouse Name: N/A  . Number of Children: 1  . Years of Education: N/A   Occupational History  . Homemaker    Social History Main Topics  . Smoking status: Never Smoker   . Smokeless tobacco: Never Used  . Alcohol Use: Yes     Comment: wine  . Drug Use: No  . Sexual Activity: Yes     Comment: lives with husband   Other Topics Concern  . Not on file   Social History Narrative   University - [place], Masters Degree - counseling, [employer]   Married '69 - 7 years, divorced; married '82   21 daughter - '72; 2 grandchildren      Regular Exercise -  YES          Outpatient Prescriptions Prior to Visit  Medication Sig Dispense Refill  . aspirin 81 MG tablet Take 81 mg by mouth daily.    Marland Kitchen b complex vitamins tablet Take 1 tablet by mouth daily.      . Calcium Carbonate-Vitamin D (CALTRATE 600+D PO) Take by mouth.    . hydrochlorothiazide (MICROZIDE) 12.5 MG capsule Take 1 capsule by mouth daily.    Marland Kitchen  lisinopril (PRINIVIL,ZESTRIL) 10 MG tablet Take 1 tablet (10 mg total) by mouth 2 (two) times daily. 180 tablet 1  . metoprolol tartrate (LOPRESSOR) 25 MG tablet Take 0.5 tablets (12.5 mg total) by mouth 2 (two) times daily. 90 tablet 1  . metoprolol tartrate (LOPRESSOR) 25 MG tablet TAKE 1 TABLET BY MOUTH ONCE DAILY 90 tablet 1  . Multiple Vitamin (MULTIVITAMIN) tablet Take 1 tablet by mouth daily. Nature's Brand    . RA KRILL OIL 500 MG CAPS Take 1 capsule by mouth daily.     No facility-administered medications prior to visit.    Allergies  Allergen Reactions  . Penicillins     REACTION: Hives Anaphylaxis    Review of Systems  Constitutional: Negative for fever and malaise/fatigue.  HENT: Negative for congestion.   Eyes: Negative for discharge.  Respiratory: Negative for shortness of breath.   Cardiovascular: Negative for chest pain, palpitations and leg swelling.  Gastrointestinal: Negative for nausea and abdominal pain.  Genitourinary: Negative for dysuria.  Musculoskeletal: Negative for falls.  Skin: Negative for rash.  Neurological: Negative for loss of consciousness and headaches.  Endo/Heme/Allergies: Negative for environmental allergies.  Psychiatric/Behavioral: Negative for depression. The patient is not nervous/anxious.        Objective:    Physical Exam  BP 130/71 mmHg  Pulse 68  Temp(Src) 97 F (36.1 C) (Oral)  Ht 5' (1.524 m)  Wt 126 lb (57.153 kg)  BMI 24.61 kg/m2  SpO2 97% Wt Readings from Last 3 Encounters:  04/14/15 126 lb (57.153 kg)  02/14/15 125 lb 3.2 oz (56.79 kg)  11/08/14 124 lb 12.8 oz (56.609 kg)     Lab Results  Component Value Date   WBC 7.4 02/14/2015   HGB 14.3 02/14/2015   HCT 41.7 02/14/2015   PLT 277.0 02/14/2015   GLUCOSE 93 11/14/2014   CHOL 197 02/14/2015   TRIG 263.0* 02/14/2015   HDL 31.50* 02/14/2015   LDLDIRECT 118.0 02/14/2015   LDLCALC 95 11/14/2014   ALT 18 11/14/2014   AST 20 11/14/2014   NA 140 11/14/2014     K 4.1 11/14/2014   CL 105 11/14/2014   CREATININE 0.70 11/14/2014   BUN 19 11/14/2014   CO2 27 11/14/2014   TSH 1.82 02/14/2015   HGBA1C 5.8 02/14/2015    Lab Results  Component Value Date   TSH 1.82 02/14/2015   Lab Results  Component Value Date   WBC 7.4 02/14/2015   HGB 14.3 02/14/2015   HCT 41.7 02/14/2015   MCV 88.6 02/14/2015   PLT 277.0 02/14/2015   Lab Results  Component Value Date   NA 140 11/14/2014   K 4.1 11/14/2014   CO2 27 11/14/2014   GLUCOSE 93 11/14/2014   BUN 19 11/14/2014   CREATININE 0.70 11/14/2014   BILITOT 0.4 11/14/2014   ALKPHOS 62 11/14/2014   AST 20 11/14/2014   ALT 18 11/14/2014   PROT 6.8 11/14/2014   ALBUMIN 4.3 11/14/2014   CALCIUM 9.6 11/14/2014   ANIONGAP 10 10/17/2014   GFR 87.75 11/14/2014   Lab Results  Component Value Date   CHOL 197 02/14/2015   Lab Results  Component Value Date   HDL 31.50* 02/14/2015   Lab Results  Component Value Date   LDLCALC 95 11/14/2014   Lab Results  Component Value Date   TRIG 263.0* 02/14/2015   Lab Results  Component Value Date   CHOLHDL 6 02/14/2015   Lab Results  Component Value Date   HGBA1C 5.8 02/14/2015       Assessment & Plan:   Problem List Items Addressed This Visit      Cardiovascular and Mediastinum   Essential hypertension    Well controlled, no changes to meds. Encouraged heart healthy diet such as the DASH diet and exercise as tolerated.         Other   Hyperlipidemia, mixed    Encouraged heart healthy diet, increase exercise, avoid trans fats, consider a krill oil cap daily       Other Visit Diagnoses    Encounter for immunization    -  Primary       I am having [name] maintain her b complex vitamins, aspirin, Calcium Carbonate-Vitamin D (CALTRATE 600+D PO), multivitamin, lisinopril, metoprolol tartrate, hydrochlorothiazide, RA KRILL OIL, and metoprolol tartrate.  No orders of the defined types were placed in this encounter.     Baird Kay, LPN

## 2015-04-14 NOTE — Assessment & Plan Note (Signed)
Encouraged heart healthy diet, increase exercise, avoid trans fats, consider a krill oil cap daily 

## 2015-04-14 NOTE — Progress Notes (Signed)
Subjective:    Patient ID: Eileen Hansen, female    DOB: [birth year], 69 y.o.   MRN: [id number]  Chief Complaint  Patient presents with  . Medicare Wellness    HPI Patient is in today for wellness exam and follow-up. Overall she feels well. She did suffer a fall earlier this year and hit her right maxillary sinus she is feeling fine and never lost any consciousness but she does note it is still slightly tender with palpation over that area. No other falls and she is not interested in further workup at this time. Declines any PND. Otherwise feels well. Denies CP/palp/SOB/HA/congestion/fevers/GI or GU c/o. Taking meds as prescribed  Past Medical History  Diagnosis Date  . Allergic rhinitis   . HTN (hypertension)   . Tachycardia     Episodic  . Globus sensation 02/23/2015  . Medicare annual wellness visit, subsequent 04/14/2015    Past Surgical History  Procedure Laterality Date  . Bunionectomy    . Appendectomy  69 yrs old  . Wisdom tooth extraction  69 yrs old    Family History  Problem Relation Age of Onset  . Cancer Father     melanoma  . Colon cancer Neg Hx   . Breast cancer Neg Hx   . Coronary artery disease Neg Hx   . Diabetes Mother   . Hypertension Mother   . Hyperlipidemia Mother   . Stroke Mother   . Heart attack Mother   . Heart attack Brother   . Cancer Brother     pancreatic    Social History   Social History  . Marital Status: Married    Spouse Name: N/A  . Number of Children: 1  . Years of Education: N/A   Occupational History  . Homemaker    Social History Main Topics  . Smoking status: Never Smoker   . Smokeless tobacco: Never Used  . Alcohol Use: Yes     Comment: wine  . Drug Use: No  . Sexual Activity: Yes     Comment: lives with husband   Other Topics Concern  . Not on file   Social History Narrative   University - [place], Masters Degree - counseling, [employer]   Married '69 - 7 years, divorced; married '82   21  daughter - '72; 2 grandchildren      Regular Exercise -  YES          Outpatient Prescriptions Prior to Visit  Medication Sig Dispense Refill  . aspirin 81 MG tablet Take 81 mg by mouth daily.    Marland Kitchen b complex vitamins tablet Take 1 tablet by mouth daily.      . Calcium Carbonate-Vitamin D (CALTRATE 600+D PO) Take by mouth.    . hydrochlorothiazide (MICROZIDE) 12.5 MG capsule Take 1 capsule by mouth daily.    Marland Kitchen lisinopril (PRINIVIL,ZESTRIL) 10 MG tablet Take 1 tablet (10 mg total) by mouth 2 (two) times daily. 180 tablet 1  . metoprolol tartrate (LOPRESSOR) 25 MG tablet Take 0.5 tablets (12.5 mg total) by mouth 2 (two) times daily. 90 tablet 1  . metoprolol tartrate (LOPRESSOR) 25 MG tablet TAKE 1 TABLET BY MOUTH ONCE DAILY 90 tablet 1  . Multiple Vitamin (MULTIVITAMIN) tablet Take 1 tablet by mouth daily. Nature's Brand    . RA KRILL OIL 500 MG CAPS Take 1 capsule by mouth daily.     No facility-administered medications prior to visit.    Allergies  Allergen Reactions  .  Penicillins     REACTION: Hives Anaphylaxis    Review of Systems  Constitutional: Negative for fever, chills and malaise/fatigue.  HENT: Negative for congestion and hearing loss.   Eyes: Negative for discharge.  Respiratory: Negative for cough, sputum production and shortness of breath.   Cardiovascular: Negative for chest pain, palpitations and leg swelling.  Gastrointestinal: Negative for heartburn, nausea, vomiting, abdominal pain, diarrhea, constipation and blood in stool.  Genitourinary: Negative for dysuria, urgency, frequency and hematuria.  Musculoskeletal: Positive for falls. Negative for myalgias and back pain.       1 fall when she missed a step fell on her face, no LOC or neurologic complaints as a result. This was months ago and she feels well but is still tender over her right maxillary sinus. No visual concerns.   Skin: Negative for rash.  Neurological: Negative for dizziness, sensory change,  loss of consciousness, weakness and headaches.  Endo/Heme/Allergies: Negative for environmental allergies. Does not bruise/bleed easily.  Psychiatric/Behavioral: Negative for depression and suicidal ideas. The patient is not nervous/anxious and does not have insomnia.        Objective:    Physical Exam  Constitutional: She is oriented to person, place, and time. She appears well-developed and well-nourished. No distress.  HENT:  Head: Normocephalic and atraumatic.  Nose: Nose normal.  Eyes: Right eye exhibits no discharge. Left eye exhibits no discharge.  Neck: Normal range of motion. Neck supple.  Cardiovascular: Normal rate and regular rhythm.   No murmur heard. Pulmonary/Chest: Effort normal and breath sounds normal.  Abdominal: Soft. Bowel sounds are normal. There is no tenderness.  Musculoskeletal: She exhibits no edema.  Neurological: She is alert and oriented to person, place, and time.  Skin: Skin is warm and dry.  Psychiatric: She has a normal mood and affect.  Nursing note and vitals reviewed.   BP 130/71 mmHg  Pulse 68  Temp(Src) 97 F (36.1 C) (Oral)  Ht 5' (1.524 m)  Wt 126 lb (57.153 kg)  BMI 24.61 kg/m2  SpO2 97% Wt Readings from Last 3 Encounters:  04/14/15 126 lb (57.153 kg)  02/14/15 125 lb 3.2 oz (56.79 kg)  11/08/14 124 lb 12.8 oz (56.609 kg)     Lab Results  Component Value Date   WBC 7.4 02/14/2015   HGB 14.3 02/14/2015   HCT 41.7 02/14/2015   PLT 277.0 02/14/2015   GLUCOSE 93 11/14/2014   CHOL 197 02/14/2015   TRIG 263.0* 02/14/2015   HDL 31.50* 02/14/2015   LDLDIRECT 118.0 02/14/2015   LDLCALC 95 11/14/2014   ALT 18 11/14/2014   AST 20 11/14/2014   NA 140 11/14/2014   K 4.1 11/14/2014   CL 105 11/14/2014   CREATININE 0.70 11/14/2014   BUN 19 11/14/2014   CO2 27 11/14/2014   TSH 1.82 02/14/2015   HGBA1C 5.8 02/14/2015    Lab Results  Component Value Date   TSH 1.82 02/14/2015   Lab Results  Component Value Date   WBC 7.4  02/14/2015   HGB 14.3 02/14/2015   HCT 41.7 02/14/2015   MCV 88.6 02/14/2015   PLT 277.0 02/14/2015   Lab Results  Component Value Date   NA 140 11/14/2014   K 4.1 11/14/2014   CO2 27 11/14/2014   GLUCOSE 93 11/14/2014   BUN 19 11/14/2014   CREATININE 0.70 11/14/2014   BILITOT 0.4 11/14/2014   ALKPHOS 62 11/14/2014   AST 20 11/14/2014   ALT 18 11/14/2014   PROT 6.8 11/14/2014  ALBUMIN 4.3 11/14/2014   CALCIUM 9.6 11/14/2014   ANIONGAP 10 10/17/2014   GFR 87.75 11/14/2014   Lab Results  Component Value Date   CHOL 197 02/14/2015   Lab Results  Component Value Date   HDL 31.50* 02/14/2015   Lab Results  Component Value Date   LDLCALC 95 11/14/2014   Lab Results  Component Value Date   TRIG 263.0* 02/14/2015   Lab Results  Component Value Date   CHOLHDL 6 02/14/2015   Lab Results  Component Value Date   HGBA1C 5.8 02/14/2015       Assessment & Plan:   Problem List Items Addressed This Visit    Medicare annual wellness visit, subsequent    Patient denies any difficulties at home. No trouble with ADLs, depression or falls. No recent changes to vision or hearing. Is UTD with immunizations. Is UTD with screening. Discussed Advanced Directives.. Encouraged heart healthy diet, exercise as tolerated and adequate sleep. Labs reviewed.  Sees Dr Christella Hartigan, gastroenterology, colonoscopy 2014, likely to be her last one Sees Dr [facility] for paps and [facility]  See problem list for risk factors See AVS for preventative health recommendations      Hyperlipidemia, mixed    Encouraged heart healthy diet, increase exercise, avoid trans fats, consider a krill oil cap daily      Relevant Orders   Lipid panel   Globus sensation    Offered ENT referral declines for now, consider allergic vs silent heartburn. Encouraged to treat both and let us know if she wants referral      Essential hypertension    Well controlled, no changes to meds. Encouraged heart healthy diet such  as the DASH diet and exercise as tolerated.       Relevant Orders   CBC   Comprehensive metabolic panel   TSH   Borderline diabetes    hgba1c acceptable, minimize simple carbs. Increase exercise as tolerated.        Other Visit Diagnoses    Encounter for immunization    -  Primary    Preventative health care        Relevant Orders    Hepatitis C antibody    Hyperglycemia        Relevant Orders    Hemoglobin A1c    Sun-damaged skin        Relevant Orders    Ambulatory referral to Dermatology       I am having Ms. Broyles maintain her b complex vitamins, aspirin, Calcium Carbonate-Vitamin D (CALTRATE 600+D PO), multivitamin, lisinopril, metoprolol tartrate, hydrochlorothiazide, RA KRILL OIL, and metoprolol tartrate.  No orders of the defined types were placed in this encounter.     Danise Edge, MD

## 2015-04-14 NOTE — Progress Notes (Signed)
Pre visit review using our clinic review tool, if applicable. No additional management support is needed unless otherwise documented below in the visit note. 

## 2015-04-14 NOTE — Assessment & Plan Note (Signed)
hgba1c acceptable, minimize simple carbs. Increase exercise as tolerated.  

## 2015-04-14 NOTE — Assessment & Plan Note (Addendum)
Offered ENT referral declines for now, consider allergic vs silent heartburn. Encouraged to treat both and let us know if she wants referral

## 2015-04-14 NOTE — Patient Instructions (Signed)
Preventive Care for Adults A healthy lifestyle and preventive care can promote health and wellness. Preventive health guidelines for women include the following key practices.  A routine yearly physical is a good way to check with your health care provider about your health and preventive screening. It is a chance to share any concerns and updates on your health and to receive a thorough exam.  Visit your dentist for a routine exam and preventive care every 6 months. Brush your teeth twice a day and floss once a day. Good oral hygiene prevents tooth decay and gum disease.  The frequency of eye exams is based on your age, health, family medical history, use of contact lenses, and other factors. Follow your health care provider's recommendations for frequency of eye exams.  Eat a healthy diet. Foods like vegetables, fruits, whole grains, low-fat dairy products, and lean protein foods contain the nutrients you need without too many calories. Decrease your intake of foods high in solid fats, added sugars, and salt. Eat the right amount of calories for you.Get information about a proper diet from your health care provider, if necessary.  Regular physical exercise is one of the most important things you can do for your health. Most adults should get at least 150 minutes of moderate-intensity exercise (any activity that increases your heart rate and causes you to sweat) each week. In addition, most adults need muscle-strengthening exercises on 2 or more days a week.  Maintain a healthy weight. The body mass index (BMI) is a screening tool to identify possible weight problems. It provides an estimate of body fat based on height and weight. Your health care provider can find your BMI and can help you achieve or maintain a healthy weight.For adults 20 years and older:  A BMI below 18.5 is considered underweight.  A BMI of 18.5 to 24.9 is normal.  A BMI of 25 to 29.9 is considered overweight.  A BMI of  30 and above is considered obese.  Maintain normal blood lipids and cholesterol levels by exercising and minimizing your intake of saturated fat. Eat a balanced diet with plenty of fruit and vegetables. Blood tests for lipids and cholesterol should begin at age 76 and be repeated every 5 years. If your lipid or cholesterol levels are high, you are over 50, or you are at high risk for heart disease, you may need your cholesterol levels checked more frequently.Ongoing high lipid and cholesterol levels should be treated with medicines if diet and exercise are not working.  If you smoke, find out from your health care provider how to quit. If you do not use tobacco, do not start.  Lung cancer screening is recommended for adults aged 22-80 years who are at high risk for developing lung cancer because of a history of smoking. A yearly low-dose CT scan of the lungs is recommended for people who have at least a 30-pack-year history of smoking and are a current smoker or have quit within the past 15 years. A pack year of smoking is smoking an average of 1 pack of cigarettes a day for 1 year (for example: 1 pack a day for 30 years or 2 packs a day for 15 years). Yearly screening should continue until the smoker has stopped smoking for at least 15 years. Yearly screening should be stopped for people who develop a health problem that would prevent them from having lung cancer treatment.  If you are pregnant, do not drink alcohol. If you are breastfeeding,  be very cautious about drinking alcohol. If you are not pregnant and choose to drink alcohol, do not have more than 1 drink per day. One drink is considered to be 12 ounces (355 mL) of beer, 5 ounces (148 mL) of wine, or 1.5 ounces (44 mL) of liquor.  Avoid use of street drugs. Do not share needles with anyone. Ask for help if you need support or instructions about stopping the use of drugs.  High blood pressure causes heart disease and increases the risk of  stroke. Your blood pressure should be checked at least every 1 to 2 years. Ongoing high blood pressure should be treated with medicines if weight loss and exercise do not work.  If you are 3-86 years old, ask your health care provider if you should take aspirin to prevent strokes.  Diabetes screening involves taking a blood sample to check your fasting blood sugar level. This should be done once every 3 years, after age 67, if you are within normal weight and without risk factors for diabetes. Testing should be considered at a younger age or be carried out more frequently if you are overweight and have at least 1 risk factor for diabetes.  Breast cancer screening is essential preventive care for women. You should practice "breast self-awareness." This means understanding the normal appearance and feel of your breasts and may include breast self-examination. Any changes detected, no matter how small, should be reported to a health care provider. Women in their 8s and 30s should have a clinical breast exam (CBE) by a health care provider as part of a regular health exam every 1 to 3 years. After age 70, women should have a CBE every year. Starting at age 25, women should consider having a mammogram (breast X-ray test) every year. Women who have a family history of breast cancer should talk to their health care provider about genetic screening. Women at a high risk of breast cancer should talk to their health care providers about having an MRI and a mammogram every year.  Breast cancer gene (BRCA)-related cancer risk assessment is recommended for women who have family members with BRCA-related cancers. BRCA-related cancers include breast, ovarian, tubal, and peritoneal cancers. Having family members with these cancers may be associated with an increased risk for harmful changes (mutations) in the breast cancer genes BRCA1 and BRCA2. Results of the assessment will determine the need for genetic counseling and  BRCA1 and BRCA2 testing.  Routine pelvic exams to screen for cancer are no longer recommended for nonpregnant women who are considered low risk for cancer of the pelvic organs (ovaries, uterus, and vagina) and who do not have symptoms. Ask your health care provider if a screening pelvic exam is right for you.  If you have had past treatment for cervical cancer or a condition that could lead to cancer, you need Pap tests and screening for cancer for at least 20 years after your treatment. If Pap tests have been discontinued, your risk factors (such as having a new sexual partner) need to be reassessed to determine if screening should be resumed. Some women have medical problems that increase the chance of getting cervical cancer. In these cases, your health care provider may recommend more frequent screening and Pap tests.  The HPV test is an additional test that may be used for cervical cancer screening. The HPV test looks for the virus that can cause the cell changes on the cervix. The cells collected during the Pap test can be  tested for HPV. The HPV test could be used to screen women aged 30 years and older, and should be used in women of any age who have unclear Pap test results. After the age of 30, women should have HPV testing at the same frequency as a Pap test.  Colorectal cancer can be detected and often prevented. Most routine colorectal cancer screening begins at the age of 50 years and continues through age 75 years. However, your health care provider may recommend screening at an earlier age if you have risk factors for colon cancer. On a yearly basis, your health care provider may provide home test kits to check for hidden blood in the stool. Use of a small camera at the end of a tube, to directly examine the colon (sigmoidoscopy or colonoscopy), can detect the earliest forms of colorectal cancer. Talk to your health care provider about this at age 50, when routine screening begins. Direct  exam of the colon should be repeated every 5-10 years through age 75 years, unless early forms of pre-cancerous polyps or small growths are found.  People who are at an increased risk for hepatitis B should be screened for this virus. You are considered at high risk for hepatitis B if:  You were born in a country where hepatitis B occurs often. Talk with your health care provider about which countries are considered high risk.  Your parents were born in a high-risk country and you have not received a shot to protect against hepatitis B (hepatitis B vaccine).  You have HIV or AIDS.  You use needles to inject street drugs.  You live with, or have sex with, someone who has hepatitis B.  You get hemodialysis treatment.  You take certain medicines for conditions like cancer, organ transplantation, and autoimmune conditions.  Hepatitis C blood testing is recommended for all people born from 1945 through 1965 and any individual with known risks for hepatitis C.  Practice safe sex. Use condoms and avoid high-risk sexual practices to reduce the spread of sexually transmitted infections (STIs). STIs include gonorrhea, chlamydia, syphilis, trichomonas, herpes, HPV, and human immunodeficiency virus (HIV). Herpes, HIV, and HPV are viral illnesses that have no cure. They can result in disability, cancer, and death.  You should be screened for sexually transmitted illnesses (STIs) including gonorrhea and chlamydia if:  You are sexually active and are younger than 24 years.  You are older than 24 years and your health care provider tells you that you are at risk for this type of infection.  Your sexual activity has changed since you were last screened and you are at an increased risk for chlamydia or gonorrhea. Ask your health care provider if you are at risk.  If you are at risk of being infected with HIV, it is recommended that you take a prescription medicine daily to prevent HIV infection. This is  called preexposure prophylaxis (PrEP). You are considered at risk if:  You are a heterosexual woman, are sexually active, and are at increased risk for HIV infection.  You take drugs by injection.  You are sexually active with a partner who has HIV.  Talk with your health care provider about whether you are at high risk of being infected with HIV. If you choose to begin PrEP, you should first be tested for HIV. You should then be tested every 3 months for as long as you are taking PrEP.  Osteoporosis is a disease in which the bones lose minerals and strength   with aging. This can result in serious bone fractures or breaks. The risk of osteoporosis can be identified using a bone density scan. Women ages 65 years and over and women at risk for fractures or osteoporosis should discuss screening with their health care providers. Ask your health care provider whether you should take a calcium supplement or vitamin D to reduce the rate of osteoporosis.  Menopause can be associated with physical symptoms and risks. Hormone replacement therapy is available to decrease symptoms and risks. You should talk to your health care provider about whether hormone replacement therapy is right for you.  Use sunscreen. Apply sunscreen liberally and repeatedly throughout the day. You should seek shade when your shadow is shorter than you. Protect yourself by wearing long sleeves, pants, a wide-brimmed hat, and sunglasses year round, whenever you are outdoors.  Once a month, do a whole body skin exam, using a mirror to look at the skin on your back. Tell your health care provider of new moles, moles that have irregular borders, moles that are larger than a pencil eraser, or moles that have changed in shape or color.  Stay current with required vaccines (immunizations).  Influenza vaccine. All adults should be immunized every year.  Tetanus, diphtheria, and acellular pertussis (Td, Tdap) vaccine. Pregnant women should  receive 1 dose of Tdap vaccine during each pregnancy. The dose should be obtained regardless of the length of time since the last dose. Immunization is preferred during the 27th-36th week of gestation. An adult who has not previously received Tdap or who does not know her vaccine status should receive 1 dose of Tdap. This initial dose should be followed by tetanus and diphtheria toxoids (Td) booster doses every 10 years. Adults with an unknown or incomplete history of completing a 3-dose immunization series with Td-containing vaccines should begin or complete a primary immunization series including a Tdap dose. Adults should receive a Td booster every 10 years.  Varicella vaccine. An adult without evidence of immunity to varicella should receive 2 doses or a second dose if she has previously received 1 dose. Pregnant females who do not have evidence of immunity should receive the first dose after pregnancy. This first dose should be obtained before leaving the health care facility. The second dose should be obtained 4-8 weeks after the first dose.  Human papillomavirus (HPV) vaccine. Females aged 13-26 years who have not received the vaccine previously should obtain the 3-dose series. The vaccine is not recommended for use in pregnant females. However, pregnancy testing is not needed before receiving a dose. If a female is found to be pregnant after receiving a dose, no treatment is needed. In that case, the remaining doses should be delayed until after the pregnancy. Immunization is recommended for any person with an immunocompromised condition through the age of 26 years if she did not get any or all doses earlier. During the 3-dose series, the second dose should be obtained 4-8 weeks after the first dose. The third dose should be obtained 24 weeks after the first dose and 16 weeks after the second dose.  Zoster vaccine. One dose is recommended for adults aged 60 years or older unless certain conditions are  present.  Measles, mumps, and rubella (MMR) vaccine. Adults born before 1957 generally are considered immune to measles and mumps. Adults born in 1957 or later should have 1 or more doses of MMR vaccine unless there is a contraindication to the vaccine or there is laboratory evidence of immunity to   each of the three diseases. A routine second dose of MMR vaccine should be obtained at least 28 days after the first dose for students attending postsecondary schools, health care workers, or international travelers. People who received inactivated measles vaccine or an unknown type of measles vaccine during 1963-1967 should receive 2 doses of MMR vaccine. People who received inactivated mumps vaccine or an unknown type of mumps vaccine before 1979 and are at high risk for mumps infection should consider immunization with 2 doses of MMR vaccine. For females of childbearing age, rubella immunity should be determined. If there is no evidence of immunity, females who are not pregnant should be vaccinated. If there is no evidence of immunity, females who are pregnant should delay immunization until after pregnancy. Unvaccinated health care workers born before 1957 who lack laboratory evidence of measles, mumps, or rubella immunity or laboratory confirmation of disease should consider measles and mumps immunization with 2 doses of MMR vaccine or rubella immunization with 1 dose of MMR vaccine.  Pneumococcal 13-valent conjugate (PCV13) vaccine. When indicated, a person who is uncertain of her immunization history and has no record of immunization should receive the PCV13 vaccine. An adult aged 19 years or older who has certain medical conditions and has not been previously immunized should receive 1 dose of PCV13 vaccine. This PCV13 should be followed with a dose of pneumococcal polysaccharide (PPSV23) vaccine. The PPSV23 vaccine dose should be obtained at least 8 weeks after the dose of PCV13 vaccine. An adult aged 19  years or older who has certain medical conditions and previously received 1 or more doses of PPSV23 vaccine should receive 1 dose of PCV13. The PCV13 vaccine dose should be obtained 1 or more years after the last PPSV23 vaccine dose.  Pneumococcal polysaccharide (PPSV23) vaccine. When PCV13 is also indicated, PCV13 should be obtained first. All adults aged 65 years and older should be immunized. An adult younger than age 65 years who has certain medical conditions should be immunized. Any person who resides in a nursing home or long-term care facility should be immunized. An adult smoker should be immunized. People with an immunocompromised condition and certain other conditions should receive both PCV13 and PPSV23 vaccines. People with human immunodeficiency virus (HIV) infection should be immunized as soon as possible after diagnosis. Immunization during chemotherapy or radiation therapy should be avoided. Routine use of PPSV23 vaccine is not recommended for American Indians, [place] Natives, or people younger than 65 years unless there are medical conditions that require PPSV23 vaccine. When indicated, people who have unknown immunization and have no record of immunization should receive PPSV23 vaccine. One-time revaccination 5 years after the first dose of PPSV23 is recommended for people aged 19-64 years who have chronic kidney failure, nephrotic syndrome, asplenia, or immunocompromised conditions. People who received 1-2 doses of PPSV23 before age 65 years should receive another dose of PPSV23 vaccine at age 65 years or later if at least 5 years have passed since the previous dose. Doses of PPSV23 are not needed for people immunized with PPSV23 at or after age 65 years.  Meningococcal vaccine. Adults with asplenia or persistent complement component deficiencies should receive 2 doses of quadrivalent meningococcal conjugate (MenACWY-D) vaccine. The doses should be obtained at least 2 months apart.  Microbiologists working with certain meningococcal bacteria, military recruits, people at risk during an outbreak, and people who travel to or live in countries with a high rate of meningitis should be immunized. A first-year college student up through age   21 years who is living in a residence hall should receive a dose if she did not receive a dose on or after her 16th birthday. Adults who have certain high-risk conditions should receive one or more doses of vaccine.  Hepatitis A vaccine. Adults who wish to be protected from this disease, have certain high-risk conditions, work with hepatitis A-infected animals, work in hepatitis A research labs, or travel to or work in countries with a high rate of hepatitis A should be immunized. Adults who were previously unvaccinated and who anticipate close contact with an international adoptee during the first 60 days after arrival in the [place] from a country with a high rate of hepatitis A should be immunized.  Hepatitis B vaccine. Adults who wish to be protected from this disease, have certain high-risk conditions, may be exposed to blood or other infectious body fluids, are household contacts or sex partners of hepatitis B positive people, are clients or workers in certain care facilities, or travel to or work in countries with a high rate of hepatitis B should be immunized.  Haemophilus influenzae type b (Hib) vaccine. A previously unvaccinated person with asplenia or sickle cell disease or having a scheduled splenectomy should receive 1 dose of Hib vaccine. Regardless of previous immunization, a recipient of a hematopoietic stem cell transplant should receive a 3-dose series 6-12 months after her successful transplant. Hib vaccine is not recommended for adults with HIV infection. Preventive Services / Frequency Ages 64 to 68 years  Blood pressure check.** / Every 1 to 2 years.  Lipid and cholesterol check.** / Every 5 years beginning at age  22.  Clinical breast exam.** / Every 3 years for women in their 88s and 53s.  BRCA-related cancer risk assessment.** / For women who have family members with a BRCA-related cancer (breast, ovarian, tubal, or peritoneal cancers).  Pap test.** / Every 2 years from ages 90 through 51. Every 3 years starting at age 21 through age 56 or 3 with a history of 3 consecutive normal Pap tests.  HPV screening.** / Every 3 years from ages 24 through ages 1 to 46 with a history of 3 consecutive normal Pap tests.  Hepatitis C blood test.** / For any individual with known risks for hepatitis C.  Skin self-exam. / Monthly.  Influenza vaccine. / Every year.  Tetanus, diphtheria, and acellular pertussis (Tdap, Td) vaccine.** / Consult your health care provider. Pregnant women should receive 1 dose of Tdap vaccine during each pregnancy. 1 dose of Td every 10 years.  Varicella vaccine.** / Consult your health care provider. Pregnant females who do not have evidence of immunity should receive the first dose after pregnancy.  HPV vaccine. / 3 doses over 6 months, if 72 and younger. The vaccine is not recommended for use in pregnant females. However, pregnancy testing is not needed before receiving a dose.  Measles, mumps, rubella (MMR) vaccine.** / You need at least 1 dose of MMR if you were born in 1957 or later. You may also need a 2nd dose. For females of childbearing age, rubella immunity should be determined. If there is no evidence of immunity, females who are not pregnant should be vaccinated. If there is no evidence of immunity, females who are pregnant should delay immunization until after pregnancy.  Pneumococcal 13-valent conjugate (PCV13) vaccine.** / Consult your health care provider.  Pneumococcal polysaccharide (PPSV23) vaccine.** / 1 to 2 doses if you smoke cigarettes or if you have certain conditions.  Meningococcal vaccine.** /  1 dose if you are age 19 to 21 years and a first-year college  student living in a residence hall, or have one of several medical conditions, you need to get vaccinated against meningococcal disease. You may also need additional booster doses.  Hepatitis A vaccine.** / Consult your health care provider.  Hepatitis B vaccine.** / Consult your health care provider.  Haemophilus influenzae type b (Hib) vaccine.** / Consult your health care provider. Ages 40 to 64 years  Blood pressure check.** / Every 1 to 2 years.  Lipid and cholesterol check.** / Every 5 years beginning at age 20 years.  Lung cancer screening. / Every year if you are aged 55-80 years and have a 30-pack-year history of smoking and currently smoke or have quit within the past 15 years. Yearly screening is stopped once you have quit smoking for at least 15 years or develop a health problem that would prevent you from having lung cancer treatment.  Clinical breast exam.** / Every year after age 40 years.  BRCA-related cancer risk assessment.** / For women who have family members with a BRCA-related cancer (breast, ovarian, tubal, or peritoneal cancers).  Mammogram.** / Every year beginning at age 40 years and continuing for as long as you are in good health. Consult with your health care provider.  Pap test.** / Every 3 years starting at age 30 years through age 65 or 70 years with a history of 3 consecutive normal Pap tests.  HPV screening.** / Every 3 years from ages 30 years through ages 65 to 70 years with a history of 3 consecutive normal Pap tests.  Fecal occult blood test (FOBT) of stool. / Every year beginning at age 50 years and continuing until age 75 years. You may not need to do this test if you get a colonoscopy every 10 years.  Flexible sigmoidoscopy or colonoscopy.** / Every 5 years for a flexible sigmoidoscopy or every 10 years for a colonoscopy beginning at age 50 years and continuing until age 75 years.  Hepatitis C blood test.** / For all people born from 1945 through  1965 and any individual with known risks for hepatitis C.  Skin self-exam. / Monthly.  Influenza vaccine. / Every year.  Tetanus, diphtheria, and acellular pertussis (Tdap/Td) vaccine.** / Consult your health care provider. Pregnant women should receive 1 dose of Tdap vaccine during each pregnancy. 1 dose of Td every 10 years.  Varicella vaccine.** / Consult your health care provider. Pregnant females who do not have evidence of immunity should receive the first dose after pregnancy.  Zoster vaccine.** / 1 dose for adults aged 60 years or older.  Measles, mumps, rubella (MMR) vaccine.** / You need at least 1 dose of MMR if you were born in 1957 or later. You may also need a 2nd dose. For females of childbearing age, rubella immunity should be determined. If there is no evidence of immunity, females who are not pregnant should be vaccinated. If there is no evidence of immunity, females who are pregnant should delay immunization until after pregnancy.  Pneumococcal 13-valent conjugate (PCV13) vaccine.** / Consult your health care provider.  Pneumococcal polysaccharide (PPSV23) vaccine.** / 1 to 2 doses if you smoke cigarettes or if you have certain conditions.  Meningococcal vaccine.** / Consult your health care provider.  Hepatitis A vaccine.** / Consult your health care provider.  Hepatitis B vaccine.** / Consult your health care provider.  Haemophilus influenzae type b (Hib) vaccine.** / Consult your health care provider. Ages 65   years and over  Blood pressure check.** / Every 1 to 2 years.  Lipid and cholesterol check.** / Every 5 years beginning at age 22 years.  Lung cancer screening. / Every year if you are aged 73-80 years and have a 30-pack-year history of smoking and currently smoke or have quit within the past 15 years. Yearly screening is stopped once you have quit smoking for at least 15 years or develop a health problem that would prevent you from having lung cancer  treatment.  Clinical breast exam.** / Every year after age 4 years.  BRCA-related cancer risk assessment.** / For women who have family members with a BRCA-related cancer (breast, ovarian, tubal, or peritoneal cancers).  Mammogram.** / Every year beginning at age 40 years and continuing for as long as you are in good health. Consult with your health care provider.  Pap test.** / Every 3 years starting at age 9 years through age 34 or 91 years with 3 consecutive normal Pap tests. Testing can be stopped between 65 and 70 years with 3 consecutive normal Pap tests and no abnormal Pap or HPV tests in the past 10 years.  HPV screening.** / Every 3 years from ages 57 years through ages 64 or 45 years with a history of 3 consecutive normal Pap tests. Testing can be stopped between 65 and 70 years with 3 consecutive normal Pap tests and no abnormal Pap or HPV tests in the past 10 years.  Fecal occult blood test (FOBT) of stool. / Every year beginning at age 15 years and continuing until age 17 years. You may not need to do this test if you get a colonoscopy every 10 years.  Flexible sigmoidoscopy or colonoscopy.** / Every 5 years for a flexible sigmoidoscopy or every 10 years for a colonoscopy beginning at age 86 years and continuing until age 71 years.  Hepatitis C blood test.** / For all people born from [birth year] through 1967 and any individual with known risks for hepatitis C.  Osteoporosis screening.** / A one-time screening for women ages 83 years and over and women at risk for fractures or osteoporosis.  Skin self-exam. / Monthly.  Influenza vaccine. / Every year.  Tetanus, diphtheria, and acellular pertussis (Tdap/Td) vaccine.** / 1 dose of Td every 10 years.  Varicella vaccine.** / Consult your health care provider.  Zoster vaccine.** / 1 dose for adults aged 61 years or older.  Pneumococcal 13-valent conjugate (PCV13) vaccine.** / Consult your health care provider.  Pneumococcal  polysaccharide (PPSV23) vaccine.** / 1 dose for all adults aged 28 years and older.  Meningococcal vaccine.** / Consult your health care provider.  Hepatitis A vaccine.** / Consult your health care provider.  Hepatitis B vaccine.** / Consult your health care provider.  Haemophilus influenzae type b (Hib) vaccine.** / Consult your health care provider. ** Family history and personal history of risk and conditions may change your health care provider's recommendations. Document Released: 08/31/2001 Document Revised: 11/19/2013 Document Reviewed: 11/30/2010 [facility] Patient Information 2015 [place], [place]. This information is not intended to replace advice given to you by your health care provider. Make sure you discuss any questions you have with your health care provider.

## 2015-04-14 NOTE — Assessment & Plan Note (Addendum)
Well controlled, no changes to meds. Encouraged heart healthy diet such as the DASH diet and exercise as tolerated. Recommend Metoprolol 12.5 mg po bid, continue Lisinopril and HCTZ

## 2015-04-21 ENCOUNTER — Other Ambulatory Visit: Payer: Self-pay | Admitting: Family Medicine

## 2015-06-17 ENCOUNTER — Encounter: Payer: Self-pay | Admitting: Physician Assistant

## 2015-06-17 ENCOUNTER — Ambulatory Visit (INDEPENDENT_AMBULATORY_CARE_PROVIDER_SITE_OTHER): Payer: Medicare Other | Admitting: Physician Assistant

## 2015-06-17 VITALS — BP 123/77 | HR 74 | Temp 98.2°F | Resp 16 | Ht 60.0 in | Wt 124.5 lb

## 2015-06-17 DIAGNOSIS — J0110 Acute frontal sinusitis, unspecified: Secondary | ICD-10-CM

## 2015-06-17 MED ORDER — HYDROCOD POLST-CPM POLST ER 10-8 MG/5ML PO SUER
5.0000 mL | Freq: Two times a day (BID) | ORAL | Status: DC | PRN
Start: 2015-06-17 — End: 2016-04-08

## 2015-06-17 MED ORDER — AZITHROMYCIN 250 MG PO TABS: ORAL_TABLET | ORAL | Status: DC

## 2015-06-17 NOTE — Progress Notes (Signed)
  Subjective:     Eileen Hansen is a 69 y.o. female who presents for evaluation of possible sinusitis. Symptoms include bilateral ear pressure/pain, congestion, facial pain, nasal congestion, post nasal drip, purulent nasal discharge, sinus pressure and sore throat. Onset of symptoms was 2 weeks ago, and has been gradually worsening since that time. Treatment to date: cough suppressants.  The following portions of the patient's history were reviewed and updated as appropriate: allergies, current medications, past family history, past medical history, past social history, past surgical history and problem list.  Review of Systems Pertinent items are noted in HPI.   Objective:    BP 123/77 mmHg  Pulse 74  Temp(Src) 98.2 F (36.8 C) (Oral)  Resp 16  Ht 5' (1.524 m)  Wt 124 lb 8 oz (56.473 kg)  BMI 24.31 kg/m2  SpO2 98% General appearance: alert, cooperative, appears stated age and no distress Head: Normocephalic, without obvious abnormality, atraumatic Ears: normal TM's and external ear canals both ears Nose: turbinates swollen, sinus tenderness bilateral Throat: lips, mucosa, and tongue normal; teeth and gums normal Lungs: clear to auscultation bilaterally Heart: regular rate and rhythm, S1, S2 normal, no murmur, click, rub or gallop   Assessment:    sinusitis   Plan:    Discussed the diagnosis and treatment of sinusitis. Suggested symptomatic OTC remedies. Nasal saline spray for congestion. Azithromycin per orders. Follow up as needed.

## 2015-06-17 NOTE — Progress Notes (Signed)
Pre visit review using our clinic review tool, if applicable. No additional management support is needed unless otherwise documented below in the visit note/SLS  

## 2015-06-17 NOTE — Patient Instructions (Signed)
Please take antibiotic as directed.  Increase fluid intake.  Use Saline nasal spray.  Take a daily multivitamin. Use Tussionex as directed for cough.  Place a humidifier in the bedroom.  Please call or return clinic if symptoms are not improving.  Sinusitis Sinusitis is redness, soreness, and swelling (inflammation) of the paranasal sinuses. Paranasal sinuses are air pockets within the bones of your face (beneath the eyes, the middle of the forehead, or above the eyes). In healthy paranasal sinuses, mucus is able to drain out, and air is able to circulate through them by way of your nose. However, when your paranasal sinuses are inflamed, mucus and air can become trapped. This can allow bacteria and other germs to grow and cause infection. Sinusitis can develop quickly and last only a short time (acute) or continue over a long period (chronic). Sinusitis that lasts for more than 12 weeks is considered chronic.  CAUSES  Causes of sinusitis include:  Allergies.  Structural abnormalities, such as displacement of the cartilage that separates your nostrils (deviated septum), which can decrease the air flow through your nose and sinuses and affect sinus drainage.  Functional abnormalities, such as when the small hairs (cilia) that line your sinuses and help remove mucus do not work properly or are not present. SYMPTOMS  Symptoms of acute and chronic sinusitis are the same. The primary symptoms are pain and pressure around the affected sinuses. Other symptoms include:  Upper toothache.  Earache.  Headache.  Bad breath.  Decreased sense of smell and taste.  A cough, which worsens when you are lying flat.  Fatigue.  Fever.  Thick drainage from your nose, which often is green and may contain pus (purulent).  Swelling and warmth over the affected sinuses. DIAGNOSIS  Your caregiver will perform a physical exam. During the exam, your caregiver may:  Look in your nose for signs of abnormal  growths in your nostrils (nasal polyps).  Tap over the affected sinus to check for signs of infection.  View the inside of your sinuses (endoscopy) with a special imaging device with a light attached (endoscope), which is inserted into your sinuses. If your caregiver suspects that you have chronic sinusitis, one or more of the following tests may be recommended:  Allergy tests.  Nasal culture A sample of mucus is taken from your nose and sent to a lab and screened for bacteria.  Nasal cytology A sample of mucus is taken from your nose and examined by your caregiver to determine if your sinusitis is related to an allergy. TREATMENT  Most cases of acute sinusitis are related to a viral infection and will resolve on their own within 10 days. Sometimes medicines are prescribed to help relieve symptoms (pain medicine, decongestants, nasal steroid sprays, or saline sprays).  However, for sinusitis related to a bacterial infection, your caregiver will prescribe antibiotic medicines. These are medicines that will help kill the bacteria causing the infection.  Rarely, sinusitis is caused by a fungal infection. In theses cases, your caregiver will prescribe antifungal medicine. For some cases of chronic sinusitis, surgery is needed. Generally, these are cases in which sinusitis recurs more than 3 times per year, despite other treatments. HOME CARE INSTRUCTIONS   Drink plenty of water. Water helps thin the mucus so your sinuses can drain more easily.  Use a humidifier.  Inhale steam 3 to 4 times a day (for example, sit in the bathroom with the shower running).  Apply a warm, moist washcloth to your face   3 to 4 times a day, or as directed by your caregiver.  Use saline nasal sprays to help moisten and clean your sinuses.  Take over-the-counter or prescription medicines for pain, discomfort, or fever only as directed by your caregiver. SEEK IMMEDIATE MEDICAL CARE IF:  You have increasing pain or  severe headaches.  You have nausea, vomiting, or drowsiness.  You have swelling around your face.  You have vision problems.  You have a stiff neck.  You have difficulty breathing. MAKE SURE YOU:   Understand these instructions.  Will watch your condition.  Will get help right away if you are not doing well or get worse. Document Released: 07/05/2005 Document Revised: 09/27/2011 Document Reviewed: 07/20/2011 [employer] Patient Information 2014 [employer].   

## 2015-07-07 ENCOUNTER — Other Ambulatory Visit: Payer: Self-pay | Admitting: Family Medicine

## 2015-07-07 MED ORDER — HYDROCHLOROTHIAZIDE 12.5 MG PO CAPS: 12.5000 mg | ORAL_CAPSULE | Freq: Every day | ORAL | Status: DC

## 2015-09-23 ENCOUNTER — Other Ambulatory Visit (INDEPENDENT_AMBULATORY_CARE_PROVIDER_SITE_OTHER): Payer: Medicare Other

## 2015-09-23 DIAGNOSIS — I10 Essential (primary) hypertension: Secondary | ICD-10-CM

## 2015-09-23 DIAGNOSIS — Z0000 Encounter for general adult medical examination without abnormal findings: Secondary | ICD-10-CM

## 2015-09-23 DIAGNOSIS — E782 Mixed hyperlipidemia: Secondary | ICD-10-CM | POA: Diagnosis not present

## 2015-09-23 DIAGNOSIS — R739 Hyperglycemia, unspecified: Secondary | ICD-10-CM

## 2015-09-23 LAB — COMPREHENSIVE METABOLIC PANEL
ALBUMIN: 4.5 g/dL (ref 3.5–5.2)
ALK PHOS: 56 U/L (ref 39–117)
ALT: 17 U/L (ref 0–35)
AST: 19 U/L (ref 0–37)
BILIRUBIN TOTAL: 0.6 mg/dL (ref 0.2–1.2)
BUN: 20 mg/dL (ref 6–23)
CO2: 27 mEq/L (ref 19–32)
CREATININE: 0.72 mg/dL (ref 0.40–1.20)
Calcium: 10.1 mg/dL (ref 8.4–10.5)
Chloride: 104 mEq/L (ref 96–112)
GFR: 84.74 mL/min (ref >60.00)
Glucose, Bld: 119 mg/dL — ABNORMAL HIGH (ref 70–99)
Potassium: 4 mEq/L (ref 3.5–5.1)
Sodium: 141 mEq/L (ref 135–145)
TOTAL PROTEIN: 7.3 g/dL (ref 6.0–8.3)

## 2015-09-23 LAB — CBC
HCT: 40.1 % (ref 36.0–46.0)
Hemoglobin: 14.1 g/dL (ref 12.0–15.0)
MCHC: 35.1 g/dL (ref 30.0–36.0)
MCV: 87.2 fl (ref 78.0–100.0)
PLATELETS: 269 10*3/uL (ref 150.0–400.0)
RBC: 4.6 Mil/uL (ref 3.87–5.11)
RDW: 12 % (ref 11.5–15.5)
WBC: 7.3 10*3/uL (ref 4.0–10.5)

## 2015-09-23 LAB — LIPID PANEL
CHOLESTEROL: 203 mg/dL — AB (ref 0–200)
HDL: 29.8 mg/dL — ABNORMAL LOW (ref >39.00)
NONHDL: 173.06
Total CHOL/HDL Ratio: 7
Triglycerides: 258 mg/dL — ABNORMAL HIGH (ref 0.0–149.0)
VLDL: 51.6 mg/dL — AB (ref 0.0–40.0)

## 2015-09-23 LAB — HEMOGLOBIN A1C: HEMOGLOBIN A1C: 6.1 % (ref 4.6–6.5)

## 2015-09-23 LAB — TSH: TSH: 2.76 u[IU]/mL (ref 0.35–4.50)

## 2015-09-23 LAB — LDL CHOLESTEROL, DIRECT: LDL DIRECT: 123 mg/dL

## 2015-09-24 LAB — HEPATITIS C ANTIBODY: HCV AB: NEGATIVE

## 2015-09-30 ENCOUNTER — Ambulatory Visit (HOSPITAL_BASED_OUTPATIENT_CLINIC_OR_DEPARTMENT_OTHER)
Admission: RE | Admit: 2015-09-30 | Discharge: 2015-09-30 | Disposition: A | Discharge status: Home / Self Care | Payer: Medicare Other | Source: Ambulatory Visit | Attending: Family Medicine | Admitting: Family Medicine

## 2015-09-30 ENCOUNTER — Ambulatory Visit (INDEPENDENT_AMBULATORY_CARE_PROVIDER_SITE_OTHER): Payer: Medicare Other | Admitting: Family Medicine

## 2015-09-30 ENCOUNTER — Encounter: Payer: Self-pay | Admitting: Family Medicine

## 2015-09-30 DIAGNOSIS — H9311 Tinnitus, right ear: Secondary | ICD-10-CM | POA: Diagnosis not present

## 2015-09-30 DIAGNOSIS — M1990 Unspecified osteoarthritis, unspecified site: Secondary | ICD-10-CM

## 2015-09-30 DIAGNOSIS — M65252 Calcific tendinitis, left thigh: Secondary | ICD-10-CM | POA: Insufficient documentation

## 2015-09-30 DIAGNOSIS — M6588 Other synovitis and tenosynovitis, other site: Secondary | ICD-10-CM

## 2015-09-30 DIAGNOSIS — R0989 Other specified symptoms and signs involving the circulatory and respiratory systems: Secondary | ICD-10-CM

## 2015-09-30 DIAGNOSIS — M25552 Pain in left hip: Secondary | ICD-10-CM | POA: Diagnosis not present

## 2015-09-30 DIAGNOSIS — M779 Enthesopathy, unspecified: Secondary | ICD-10-CM

## 2015-09-30 DIAGNOSIS — R000 Tachycardia, unspecified: Secondary | ICD-10-CM

## 2015-09-30 DIAGNOSIS — E782 Mixed hyperlipidemia: Secondary | ICD-10-CM | POA: Diagnosis not present

## 2015-09-30 DIAGNOSIS — F458 Other somatoform disorders: Secondary | ICD-10-CM

## 2015-09-30 DIAGNOSIS — M778 Other enthesopathies, not elsewhere classified: Secondary | ICD-10-CM

## 2015-09-30 DIAGNOSIS — R040 Epistaxis: Secondary | ICD-10-CM | POA: Insufficient documentation

## 2015-09-30 DIAGNOSIS — M79602 Pain in left arm: Secondary | ICD-10-CM | POA: Insufficient documentation

## 2015-09-30 DIAGNOSIS — M2550 Pain in unspecified joint: Secondary | ICD-10-CM

## 2015-09-30 DIAGNOSIS — M159 Polyosteoarthritis, unspecified: Secondary | ICD-10-CM | POA: Insufficient documentation

## 2015-09-30 DIAGNOSIS — I10 Essential (primary) hypertension: Secondary | ICD-10-CM

## 2015-09-30 HISTORY — DX: Pain in left hip: M25.552

## 2015-09-30 HISTORY — DX: Pain in unspecified joint: M25.50

## 2015-09-30 HISTORY — DX: Epistaxis: R04.0

## 2015-09-30 LAB — RHEUMATOID FACTOR

## 2015-09-30 MED ORDER — MUPIROCIN 2 % EX OINT: 1.0000 "application " | TOPICAL_OINTMENT | Freq: Every evening | CUTANEOUS | Status: DC | PRN

## 2015-09-30 MED ORDER — ZOSTER VACCINE LIVE 19400 UNT/0.65ML ~~LOC~~ SOLR: 0.6500 mL | Freq: Once | SUBCUTANEOUS | Status: DC

## 2015-09-30 NOTE — Assessment & Plan Note (Signed)
Mupirocin ointment to nares qhs if no improvement has been referred to ENT

## 2015-09-30 NOTE — Assessment & Plan Note (Signed)
Persistent despite adequate hydration will refer to ENT for evaluation since patient has ongoing concerns.

## 2015-09-30 NOTE — Assessment & Plan Note (Addendum)
Xray today due to pain of 6 months. Try 1 200 mg Advil daily

## 2015-09-30 NOTE — Assessment & Plan Note (Signed)
She agrees to take Zostavax to pharmacy and see if it is covered, printed

## 2015-09-30 NOTE — Patient Instructions (Addendum)
Salonpas with lidocaine and Aspercreme twice daily apply to hand. Arthritis Arthritis is a term that is commonly used to refer to joint pain or joint disease. There are more than 100 types of arthritis. CAUSES The most common cause of this condition is wear and tear of a joint. Other causes include:  Gout.  Inflammation of a joint.  An infection of a joint.  Sprains and other injuries near the joint.  A drug reaction or allergic reaction. In some cases, the cause may not be known. SYMPTOMS The main symptom of this condition is pain in the joint with movement. Other symptoms include:  Redness, swelling, or stiffness at a joint.  Warmth coming from the joint.  Fever.  Overall feeling of illness. DIAGNOSIS This condition may be diagnosed with a physical exam and tests, including:  Blood tests.  Urine tests.  Imaging tests, such as MRI, X-rays, or a CT scan. Sometimes, fluid is removed from a joint for testing. TREATMENT Treatment for this condition may involve:  Treatment of the cause, if it is known.  Rest.  Raising (elevating) the joint.  Applying cold or hot packs to the joint.  Medicines to improve symptoms and reduce inflammation.  Injections of a steroid such as cortisone into the joint to help reduce pain and inflammation. Depending on the cause of your arthritis, you may need to make lifestyle changes to reduce stress on your joint. These changes may include exercising more and losing weight. HOME CARE INSTRUCTIONS Medicines  Take over-the-counter and prescription medicines only as told by your health care provider.  Do not take aspirin to relieve pain if gout is suspected. Activities  Rest your joint if told by your health care provider. Rest is important when your disease is active and your joint feels painful, swollen, or stiff.  Avoid activities that make the pain worse. It is important to balance activity with rest.  Exercise your joint  regularly with range-of-motion exercises as told by your health care provider. Try doing low-impact exercise, such as:  Swimming.  Water aerobics.  Biking.  Walking. Joint Care  If your joint is swollen, keep it elevated if told by your health care provider.  If your joint feels stiff in the morning, try taking a warm shower.  If directed, apply heat to the joint. If you have diabetes, do not apply heat without permission from your health care provider.  Put a towel between the joint and the hot pack or heating pad.  Leave the heat on the area for 20-30 minutes.  If directed, apply ice to the joint:  Put ice in a plastic bag.  Place a towel between your skin and the bag.  Leave the ice on for 20 minutes, 2-3 times per day.  Keep all follow-up visits as told by your health care provider. This is important. SEEK MEDICAL CARE IF:  The pain gets worse.  You have a fever. SEEK IMMEDIATE MEDICAL CARE IF:  You develop severe joint pain, swelling, or redness.  Many joints become painful and swollen.  You develop severe back pain.  You develop severe weakness in your leg.  You cannot control your bladder or bowels.   This information is not intended to replace advice given to you by your health care provider. Make sure you discuss any questions you have with your health care provider.   Document Released: 08/12/2004 Document Revised: 03/26/2015 Document Reviewed: 09/30/2014 [employer] Interactive Patient Education [employer]

## 2015-09-30 NOTE — Progress Notes (Signed)
Subjective:    Patient ID: Eileen Hansen, female    DOB: 1945/08/20, 70 y.o.   MRN: [id number]  Chief Complaint  Patient presents with  . Follow-up    HPI Patient is in today for follow up with numerous concerns. Patient state she is having some left hand pain that radiates up arm, states arm is weak and movement makes it worse, and some occasionally nosebleeds on right side.  Patient having some tinnitus in right ear.  Patient does yoga and goes to the [facility].  Patient having some hip and thigh pain that has been going on about 6 months. Patient has not been taking any medication for treatmnt for the pain.  Denies CP/palp/SOB/HA/congestion/fevers/GI or GU c/o. Taking meds as prescribed   Past Medical History  Diagnosis Date  . Allergic rhinitis   . HTN (hypertension)   . Tachycardia     Episodic  . Globus sensation 02/23/2015  . Medicare annual wellness visit, subsequent 04/14/2015  . Epistaxis 09/30/2015  . Left hip pain 09/30/2015  . Arthralgia 09/30/2015    Past Surgical History  Procedure Laterality Date  . Bunionectomy    . Appendectomy  70 yrs old  . Wisdom tooth extraction  70 yrs old    Family History  Problem Relation Age of Onset  . Cancer Father     melanoma  . Colon cancer Neg Hx   . Breast cancer Neg Hx   . Coronary artery disease Neg Hx   . Diabetes Mother   . Hypertension Mother   . Hyperlipidemia Mother   . Stroke Mother   . Heart attack Mother   . Heart attack Brother   . Cancer Brother     pancreatic    Social History   Social History  . Marital Status: Married    Spouse Name: N/A  . Number of Children: 1  . Years of Education: N/A   Occupational History  . Homemaker    Social History Main Topics  . Smoking status: Never Smoker   . Smokeless tobacco: Never Used  . Alcohol Use: Yes     Comment: wine  . Drug Use: No  . Sexual Activity: Yes     Comment: lives with husband   Other Topics Concern  . Not on file   Social History Narrative    University - [place], Masters Degree - counseling, [employer]   Married '69 - 7 years, divorced; married '82   21 daughter - '72; 2 grandchildren      Regular Exercise -  YES          Outpatient Prescriptions Prior to Visit  Medication Sig Dispense Refill  . aspirin 81 MG tablet Take 81 mg by mouth daily.    Marland Kitchen b complex vitamins tablet Take 1 tablet by mouth daily.      . hydrochlorothiazide (MICROZIDE) 12.5 MG capsule Take 1 capsule (12.5 mg total) by mouth daily. 90 capsule 1  . lisinopril (PRINIVIL,ZESTRIL) 10 MG tablet TAKE 2 TABLETS BY MOUTH DAILY. 180 tablet 1  . metoprolol tartrate (LOPRESSOR) 25 MG tablet Take 0.5 tablets (12.5 mg total) by mouth 2 (two) times daily. 90 tablet 1  . Multiple Vitamin (MULTIVITAMIN) tablet Take 1 tablet by mouth daily. Nature's Brand    . chlorpheniramine-HYDROcodone (TUSSIONEX) 10-8 MG/5ML SUER Take 5 mLs by mouth every 12 (twelve) hours as needed for cough. (Patient not taking: Reported on 09/30/2015) 115 mL 0  . RA KRILL OIL 500 MG  CAPS Take 1 capsule by mouth daily. Reported on 09/30/2015    . azithromycin (ZITHROMAX) 250 MG tablet Two tablets day one, then one tablet daily next 4 days. 6 tablet 0   No facility-administered medications prior to visit.    Allergies  Allergen Reactions  . Penicillins     REACTION: Hives Anaphylaxis    Review of Systems  Constitutional: Negative for fever and malaise/fatigue.  HENT: Positive for nosebleeds and tinnitus. Negative for congestion.   Eyes: Negative for blurred vision.  Respiratory: Negative for shortness of breath.   Cardiovascular: Negative for chest pain, palpitations and leg swelling.  Gastrointestinal: Negative for nausea, abdominal pain and blood in stool.  Genitourinary: Negative for dysuria and frequency.  Musculoskeletal: Positive for back pain and joint pain. Negative for falls.  Skin: Negative for rash.  Neurological: Negative for dizziness, loss of consciousness  and headaches.  Endo/Heme/Allergies: Negative for environmental allergies.  Psychiatric/Behavioral: Negative for depression. The patient is not nervous/anxious.        Objective:    Physical Exam  Constitutional: She is oriented to person, place, and time. She appears well-developed and well-nourished. No distress.  HENT:  Head: Normocephalic and atraumatic.  Eyes: Conjunctivae are normal.  Neck: Neck supple. No thyromegaly present.  Cardiovascular: Normal rate, regular rhythm and normal heart sounds.   No murmur heard. Pulmonary/Chest: Effort normal and breath sounds normal. No respiratory distress.  Abdominal: Soft. Bowel sounds are normal. She exhibits no distension and no mass. There is no tenderness.  Musculoskeletal: She exhibits no edema.  Lymphadenopathy:    She has no cervical adenopathy.  Neurological: She is alert and oriented to person, place, and time.  Skin: Skin is warm and dry.  Psychiatric: She has a normal mood and affect. Her behavior is normal.    BP 138/80 mmHg  Pulse 76  Temp(Src) 97.7 F (36.5 C) (Oral)  Ht 5' (1.524 m)  Wt 126 lb 12.8 oz (57.516 kg)  BMI 24.76 kg/m2  SpO2 96% Wt Readings from Last 3 Encounters:  09/30/15 126 lb 12.8 oz (57.516 kg)  06/17/15 124 lb 8 oz (56.473 kg)  04/14/15 126 lb (57.153 kg)     Lab Results  Component Value Date   WBC 7.3 09/23/2015   HGB 14.1 09/23/2015   HCT 40.1 09/23/2015   PLT 269.0 09/23/2015   GLUCOSE 119* 09/23/2015   CHOL 203* 09/23/2015   TRIG 258.0* 09/23/2015   HDL 29.80* 09/23/2015   LDLDIRECT 123.0 09/23/2015   LDLCALC 95 11/14/2014   ALT 17 09/23/2015   AST 19 09/23/2015   NA 141 09/23/2015   K 4.0 09/23/2015   CL 104 09/23/2015   CREATININE 0.72 09/23/2015   BUN 20 09/23/2015   CO2 27 09/23/2015   TSH 2.76 09/23/2015   HGBA1C 6.1 09/23/2015    Lab Results  Component Value Date   TSH 2.76 09/23/2015   Lab Results  Component Value Date   WBC 7.3 09/23/2015   HGB 14.1  09/23/2015   HCT 40.1 09/23/2015   MCV 87.2 09/23/2015   PLT 269.0 09/23/2015   Lab Results  Component Value Date   NA 141 09/23/2015   K 4.0 09/23/2015   CO2 27 09/23/2015   GLUCOSE 119* 09/23/2015   BUN 20 09/23/2015   CREATININE 0.72 09/23/2015   BILITOT 0.6 09/23/2015   ALKPHOS 56 09/23/2015   AST 19 09/23/2015   ALT 17 09/23/2015   PROT 7.3 09/23/2015   ALBUMIN 4.5 09/23/2015  CALCIUM 10.1 09/23/2015   ANIONGAP 10 10/17/2014   GFR 84.74 09/23/2015   Lab Results  Component Value Date   CHOL 203* 09/23/2015   Lab Results  Component Value Date   HDL 29.80* 09/23/2015   Lab Results  Component Value Date   LDLCALC 95 11/14/2014   Lab Results  Component Value Date   TRIG 258.0* 09/23/2015   Lab Results  Component Value Date   CHOLHDL 7 09/23/2015   Lab Results  Component Value Date   HGBA1C 6.1 09/23/2015       Assessment & Plan:   Problem List Items Addressed This Visit    Arthralgia    More diffuse at this time, will check a RF today and reassess at next visit. Continue to stay active and well hydrated      Relevant Orders   Lipid panel   TSH   Comprehensive metabolic panel   CBC   Microalbumin / creatinine urine ratio   Epistaxis    Mupirocin ointment to nares qhs if no improvement has been referred to ENT      Relevant Orders   Ambulatory referral to ENT   Lipid panel   TSH   Comprehensive metabolic panel   CBC   Microalbumin / creatinine urine ratio   Essential hypertension    Well controlled, no changes to meds. Encouraged heart healthy diet such as the DASH diet and exercise as tolerated.       Relevant Orders   Lipid panel   TSH   Comprehensive metabolic panel   CBC   Microalbumin / creatinine urine ratio   Globus sensation    Persistent despite adequate hydration will refer to ENT for evaluation since patient has ongoing concerns.      Relevant Orders   Lipid panel   TSH   Comprehensive metabolic panel   CBC    Microalbumin / creatinine urine ratio   Hyperlipidemia, mixed    Encouraged heart healthy diet, increase exercise, avoid trans fats, consider a krill oil cap daily      Relevant Orders   Lipid panel   TSH   Comprehensive metabolic panel   CBC   Microalbumin / creatinine urine ratio   Left arm pain    Refer to ortho and try topical treatments such as Salon pas or aspercreme twice a day      Relevant Orders   Lipid panel   TSH   Comprehensive metabolic panel   CBC   Microalbumin / creatinine urine ratio   Left hip pain    Xray today due to pain of 6 months. Try 1 200 mg Advil daily      Relevant Orders   DG HIP UNILAT WITH PELVIS 2-3 VIEWS LEFT (Completed)   Lipid panel   TSH   Comprehensive metabolic panel   CBC   Microalbumin / creatinine urine ratio   Tachycardia    RRR today      Relevant Orders   Lipid panel   TSH   Comprehensive metabolic panel   CBC   Microalbumin / creatinine urine ratio   Tinnitus of right ear   Relevant Orders   Ambulatory referral to ENT   Lipid panel   TSH   Comprehensive metabolic panel   CBC   Microalbumin / creatinine urine ratio    Other Visit Diagnoses    Arthritis        Relevant Orders    Ambulatory referral to Orthopedic Surgery    Rheumatoid  factor (Completed)    Lipid panel    TSH    Comprehensive metabolic panel    CBC    Microalbumin / creatinine urine ratio    Left hand tendonitis        Relevant Orders    Ambulatory referral to Orthopedic Surgery    Lipid panel    TSH    Comprehensive metabolic panel    CBC    Microalbumin / creatinine urine ratio       I have discontinued Eileen Hansen azithromycin. I am also having her start on mupirocin ointment and zoster vaccine live (PF). Additionally, I am having her maintain her b complex vitamins, aspirin, multivitamin, metoprolol tartrate, RA KRILL OIL, lisinopril, chlorpheniramine-HYDROcodone, hydrochlorothiazide, Vitamin D3, and Glucosamine HCl (GLUCOSAMINE  PO).  Meds ordered this encounter  Medications  . Cholecalciferol (VITAMIN D3) 3000 units TABS    Sig: Take by mouth.  . Glucosamine HCl (GLUCOSAMINE PO)    Sig: Take 2 tablets by mouth.   . mupirocin ointment (BACTROBAN) 2 %    Sig: Place 1 application into the nose at bedtime as needed. Apply via to each nare at bedtime times 7 day.    Dispense:  30 g    Refill:  0  . zoster vaccine live, PF, (ZOSTAVAX) 40981 UNT/0.65ML injection    Sig: Inject 19,400 Units into the skin once.    Dispense:  1 each    Refill:  0     Danise Edge, MD

## 2015-09-30 NOTE — Assessment & Plan Note (Signed)
hgba1c acceptable, minimize simple carbs. Increase exercise as tolerated. Continue current meds 

## 2015-09-30 NOTE — Assessment & Plan Note (Addendum)
More diffuse at this time, will check a RF today and reassess at next visit. Continue to stay active and well hydrated

## 2015-09-30 NOTE — Assessment & Plan Note (Signed)
Well controlled, no changes to meds. Encouraged heart healthy diet such as the DASH diet and exercise as tolerated.  °

## 2015-09-30 NOTE — Progress Notes (Signed)
Pre visit review using our clinic review tool, if applicable. No additional management support is needed unless otherwise documented below in the visit note. 

## 2015-09-30 NOTE — Assessment & Plan Note (Signed)
Refer to ortho and try topical treatments such as Salon pas or aspercreme twice a day

## 2015-10-01 NOTE — Assessment & Plan Note (Signed)
RRR today 

## 2015-10-01 NOTE — Assessment & Plan Note (Signed)
Encouraged heart healthy diet, increase exercise, avoid trans fats, consider a krill oil cap daily 

## 2015-10-06 ENCOUNTER — Other Ambulatory Visit: Payer: Self-pay | Admitting: Family Medicine

## 2015-12-29 ENCOUNTER — Other Ambulatory Visit: Payer: Self-pay | Admitting: Family Medicine

## 2016-03-15 LAB — HM MAMMOGRAPHY

## 2016-03-27 ENCOUNTER — Other Ambulatory Visit: Payer: Self-pay | Admitting: Family Medicine

## 2016-04-01 ENCOUNTER — Encounter: Payer: Self-pay | Admitting: Family Medicine

## 2016-04-02 ENCOUNTER — Other Ambulatory Visit (INDEPENDENT_AMBULATORY_CARE_PROVIDER_SITE_OTHER): Payer: Medicare Other

## 2016-04-02 DIAGNOSIS — R040 Epistaxis: Secondary | ICD-10-CM

## 2016-04-02 DIAGNOSIS — M1990 Unspecified osteoarthritis, unspecified site: Secondary | ICD-10-CM

## 2016-04-02 DIAGNOSIS — M778 Other enthesopathies, not elsewhere classified: Secondary | ICD-10-CM

## 2016-04-02 DIAGNOSIS — M779 Enthesopathy, unspecified: Secondary | ICD-10-CM

## 2016-04-02 DIAGNOSIS — E782 Mixed hyperlipidemia: Secondary | ICD-10-CM

## 2016-04-02 DIAGNOSIS — I10 Essential (primary) hypertension: Secondary | ICD-10-CM

## 2016-04-02 DIAGNOSIS — M6588 Other synovitis and tenosynovitis, other site: Secondary | ICD-10-CM

## 2016-04-02 DIAGNOSIS — R000 Tachycardia, unspecified: Secondary | ICD-10-CM

## 2016-04-02 DIAGNOSIS — M79602 Pain in left arm: Secondary | ICD-10-CM

## 2016-04-02 DIAGNOSIS — R0989 Other specified symptoms and signs involving the circulatory and respiratory systems: Secondary | ICD-10-CM

## 2016-04-02 DIAGNOSIS — H9311 Tinnitus, right ear: Secondary | ICD-10-CM

## 2016-04-02 DIAGNOSIS — M2550 Pain in unspecified joint: Secondary | ICD-10-CM

## 2016-04-02 DIAGNOSIS — M25552 Pain in left hip: Secondary | ICD-10-CM

## 2016-04-02 DIAGNOSIS — F458 Other somatoform disorders: Secondary | ICD-10-CM

## 2016-04-02 LAB — MICROALBUMIN / CREATININE URINE RATIO
Creatinine,U: 110.9 mg/dL
MICROALB/CREAT RATIO: 0.6 mg/g (ref 0.0–30.0)
Microalb, Ur: <0.7 mg/dL (ref 0.0–1.9)

## 2016-04-02 LAB — COMPREHENSIVE METABOLIC PANEL
ALT: 13 U/L (ref 0–35)
AST: 15 U/L (ref 0–37)
Albumin: 4.1 g/dL (ref 3.5–5.2)
Alkaline Phosphatase: 59 U/L (ref 39–117)
BILIRUBIN TOTAL: 0.4 mg/dL (ref 0.2–1.2)
BUN: 18 mg/dL (ref 6–23)
CALCIUM: 9.4 mg/dL (ref 8.4–10.5)
CO2: 29 meq/L (ref 19–32)
Chloride: 106 mEq/L (ref 96–112)
Creatinine, Ser: 0.7 mg/dL (ref 0.40–1.20)
GFR: 87.41 mL/min (ref >60.00)
Glucose, Bld: 107 mg/dL — ABNORMAL HIGH (ref 70–99)
POTASSIUM: 4.1 meq/L (ref 3.5–5.1)
Sodium: 141 mEq/L (ref 135–145)
Total Protein: 6.9 g/dL (ref 6.0–8.3)

## 2016-04-02 LAB — CBC
HEMATOCRIT: 38 % (ref 36.0–46.0)
HEMOGLOBIN: 13.4 g/dL (ref 12.0–15.0)
MCHC: 35.1 g/dL (ref 30.0–36.0)
MCV: 88.1 fl (ref 78.0–100.0)
PLATELETS: 238 10*3/uL (ref 150.0–400.0)
RBC: 4.31 Mil/uL (ref 3.87–5.11)
RDW: 12.3 % (ref 11.5–15.5)
WBC: 7.1 10*3/uL (ref 4.0–10.5)

## 2016-04-02 LAB — LIPID PANEL
CHOL/HDL RATIO: 6
CHOLESTEROL: 163 mg/dL (ref 0–200)
HDL: 26.3 mg/dL — ABNORMAL LOW (ref >39.00)
NonHDL: 136.99
TRIGLYCERIDES: 238 mg/dL — AB (ref 0.0–149.0)
VLDL: 47.6 mg/dL — ABNORMAL HIGH (ref 0.0–40.0)

## 2016-04-02 LAB — LDL CHOLESTEROL, DIRECT: Direct LDL: 101 mg/dL

## 2016-04-02 LAB — TSH: TSH: 2.06 u[IU]/mL (ref 0.35–4.50)

## 2016-04-05 LAB — HM DEXA SCAN: HM Dexa Scan: -0.9

## 2016-04-08 ENCOUNTER — Encounter: Payer: Self-pay | Admitting: Family Medicine

## 2016-04-08 ENCOUNTER — Ambulatory Visit (INDEPENDENT_AMBULATORY_CARE_PROVIDER_SITE_OTHER): Payer: Medicare Other | Admitting: Family Medicine

## 2016-04-08 VITALS — BP 132/82 | HR 66 | Temp 98.2°F | Ht 60.0 in | Wt 127.1 lb

## 2016-04-08 DIAGNOSIS — Z0000 Encounter for general adult medical examination without abnormal findings: Secondary | ICD-10-CM | POA: Diagnosis not present

## 2016-04-08 DIAGNOSIS — E782 Mixed hyperlipidemia: Secondary | ICD-10-CM

## 2016-04-08 DIAGNOSIS — I10 Essential (primary) hypertension: Secondary | ICD-10-CM

## 2016-04-08 DIAGNOSIS — E785 Hyperlipidemia, unspecified: Secondary | ICD-10-CM

## 2016-04-08 DIAGNOSIS — R739 Hyperglycemia, unspecified: Secondary | ICD-10-CM

## 2016-04-08 DIAGNOSIS — Z23 Encounter for immunization: Secondary | ICD-10-CM | POA: Diagnosis not present

## 2016-04-08 DIAGNOSIS — R7303 Prediabetes: Secondary | ICD-10-CM

## 2016-04-08 DIAGNOSIS — M2550 Pain in unspecified joint: Secondary | ICD-10-CM

## 2016-04-08 DIAGNOSIS — M8580 Other specified disorders of bone density and structure, unspecified site: Secondary | ICD-10-CM

## 2016-04-08 NOTE — Patient Instructions (Addendum)
1200 to 1500 mg calcium divided into 3 doses, a typical serving of a calcium rich food has 500 mg   Continue calcium with vit d tabs twice daily and add vitamin D 1000 IU caps, 1 daily  Dr Salomon Fick Ward, chiropractor in [facility] for Adults, Female A healthy lifestyle and preventive care can promote health and wellness. Preventive health guidelines for women include the following key practices.  A routine yearly physical is a good way to check with your health care provider about your health and preventive screening. It is a chance to share any concerns and updates on your health and to receive a thorough exam.  Visit your dentist for a routine exam and preventive care every 6 months. Brush your teeth twice a day and floss once a day. Good oral hygiene prevents tooth decay and gum disease.  The frequency of eye exams is based on your age, health, family medical history, use of contact lenses, and other factors. Follow your health care provider's recommendations for frequency of eye exams.  Eat a healthy diet. Foods like vegetables, fruits, whole grains, low-fat dairy products, and lean protein foods contain the nutrients you need without too many calories. Decrease your intake of foods high in solid fats, added sugars, and salt. Eat the right amount of calories for you.Get information about a proper diet from your health care provider, if necessary.  Regular physical exercise is one of the most important things you can do for your health. Most adults should get at least 150 minutes of moderate-intensity exercise (any activity that increases your heart rate and causes you to sweat) each week. In addition, most adults need muscle-strengthening exercises on 2 or more days a week.  Maintain a healthy weight. The body mass index (BMI) is a screening tool to identify possible weight problems. It provides an estimate of body fat based on height and weight. Your health care provider  can find your BMI and can help you achieve or maintain a healthy weight.For adults 20 years and older:  A BMI below 18.5 is considered underweight.  A BMI of 18.5 to 24.9 is normal.  A BMI of 25 to 29.9 is considered overweight.  A BMI of 30 and above is considered obese.  Maintain normal blood lipids and cholesterol levels by exercising and minimizing your intake of saturated fat. Eat a balanced diet with plenty of fruit and vegetables. Blood tests for lipids and cholesterol should begin at age 13 and be repeated every 5 years. If your lipid or cholesterol levels are high, you are over 50, or you are at high risk for heart disease, you may need your cholesterol levels checked more frequently.Ongoing high lipid and cholesterol levels should be treated with medicines if diet and exercise are not working.  If you smoke, find out from your health care provider how to quit. If you do not use tobacco, do not start.  Lung cancer screening is recommended for adults aged 34-80 years who are at high risk for developing lung cancer because of a history of smoking. A yearly low-dose CT scan of the lungs is recommended for people who have at least a 30-pack-year history of smoking and are a current smoker or have quit within the past 15 years. A pack year of smoking is smoking an average of 1 pack of cigarettes a day for 1 year (for example: 1 pack a day for 30 years or 2 packs a day for 15  years). Yearly screening should continue until the smoker has stopped smoking for at least 15 years. Yearly screening should be stopped for people who develop a health problem that would prevent them from having lung cancer treatment.  If you are pregnant, do not drink alcohol. If you are breastfeeding, be very cautious about drinking alcohol. If you are not pregnant and choose to drink alcohol, do not have more than 1 drink per day. One drink is considered to be 12 ounces (355 mL) of beer, 5 ounces (148 mL) of wine, or 1.5  ounces (44 mL) of liquor.  Avoid use of street drugs. Do not share needles with anyone. Ask for help if you need support or instructions about stopping the use of drugs.  High blood pressure causes heart disease and increases the risk of stroke. Your blood pressure should be checked at least every 1 to 2 years. Ongoing high blood pressure should be treated with medicines if weight loss and exercise do not work.  If you are 59-61 years old, ask your health care provider if you should take aspirin to prevent strokes.  Diabetes screening is done by taking a blood sample to check your blood glucose level after you have not eaten for a certain period of time (fasting). If you are not overweight and you do not have risk factors for diabetes, you should be screened once every 3 years starting at age 26. If you are overweight or obese and you are 73-11 years of age, you should be screened for diabetes every year as part of your cardiovascular risk assessment.  Breast cancer screening is essential preventive care for women. You should practice "breast self-awareness." This means understanding the normal appearance and feel of your breasts and may include breast self-examination. Any changes detected, no matter how small, should be reported to a health care provider. Women in their 96s and 30s should have a clinical breast exam (CBE) by a health care provider as part of a regular health exam every 1 to 3 years. After age 32, women should have a CBE every year. Starting at age 82, women should consider having a mammogram (breast X-ray test) every year. Women who have a family history of breast cancer should talk to their health care provider about genetic screening. Women at a high risk of breast cancer should talk to their health care providers about having an MRI and a mammogram every year.  Breast cancer gene (BRCA)-related cancer risk assessment is recommended for women who have family members with BRCA-related  cancers. BRCA-related cancers include breast, ovarian, tubal, and peritoneal cancers. Having family members with these cancers may be associated with an increased risk for harmful changes (mutations) in the breast cancer genes BRCA1 and BRCA2. Results of the assessment will determine the need for genetic counseling and BRCA1 and BRCA2 testing.  Your health care provider may recommend that you be screened regularly for cancer of the pelvic organs (ovaries, uterus, and vagina). This screening involves a pelvic examination, including checking for microscopic changes to the surface of your cervix (Pap test). You may be encouraged to have this screening done every 3 years, beginning at age 41.  For women ages 75-65, health care providers may recommend pelvic exams and Pap testing every 3 years, or they may recommend the Pap and pelvic exam, combined with testing for human papilloma virus (HPV), every 5 years. Some types of HPV increase your risk of cervical cancer. Testing for HPV may also be done on  women of any age with unclear Pap test results.  Other health care providers may not recommend any screening for nonpregnant women who are considered low risk for pelvic cancer and who do not have symptoms. Ask your health care provider if a screening pelvic exam is right for you.  If you have had past treatment for cervical cancer or a condition that could lead to cancer, you need Pap tests and screening for cancer for at least 20 years after your treatment. If Pap tests have been discontinued, your risk factors (such as having a new sexual partner) need to be reassessed to determine if screening should resume. Some women have medical problems that increase the chance of getting cervical cancer. In these cases, your health care provider may recommend more frequent screening and Pap tests.  Colorectal cancer can be detected and often prevented. Most routine colorectal cancer screening begins at the age of 64 years  and continues through age 49 years. However, your health care provider may recommend screening at an earlier age if you have risk factors for colon cancer. On a yearly basis, your health care provider may provide home test kits to check for hidden blood in the stool. Use of a small camera at the end of a tube, to directly examine the colon (sigmoidoscopy or colonoscopy), can detect the earliest forms of colorectal cancer. Talk to your health care provider about this at age 86, when routine screening begins. Direct exam of the colon should be repeated every 5-10 years through age 55 years, unless early forms of precancerous polyps or small growths are found.  People who are at an increased risk for hepatitis B should be screened for this virus. You are considered at high risk for hepatitis B if:  You were born in a country where hepatitis B occurs often. Talk with your health care provider about which countries are considered high risk.  Your parents were born in a high-risk country and you have not received a shot to protect against hepatitis B (hepatitis B vaccine).  You have HIV or AIDS.  You use needles to inject street drugs.  You live with, or have sex with, someone who has hepatitis B.  You get hemodialysis treatment.  You take certain medicines for conditions like cancer, organ transplantation, and autoimmune conditions.  Hepatitis C blood testing is recommended for all people born from [birth year] through 1965 and any individual with known risks for hepatitis C.  Practice safe sex. Use condoms and avoid high-risk sexual practices to reduce the spread of sexually transmitted infections (STIs). STIs include gonorrhea, chlamydia, syphilis, trichomonas, herpes, HPV, and human immunodeficiency virus (HIV). Herpes, HIV, and HPV are viral illnesses that have no cure. They can result in disability, cancer, and death.  You should be screened for sexually transmitted illnesses (STIs) including  gonorrhea and chlamydia if:  You are sexually active and are younger than 24 years.  You are older than 24 years and your health care provider tells you that you are at risk for this type of infection.  Your sexual activity has changed since you were last screened and you are at an increased risk for chlamydia or gonorrhea. Ask your health care provider if you are at risk.  If you are at risk of being infected with HIV, it is recommended that you take a prescription medicine daily to prevent HIV infection. This is called preexposure prophylaxis (PrEP). You are considered at risk if:  You are sexually active and do  not regularly use condoms or know the HIV status of your partner(s).  You take drugs by injection.  You are sexually active with a partner who has HIV.  Talk with your health care provider about whether you are at high risk of being infected with HIV. If you choose to begin PrEP, you should first be tested for HIV. You should then be tested every 3 months for as long as you are taking PrEP.  Osteoporosis is a disease in which the bones lose minerals and strength with aging. This can result in serious bone fractures or breaks. The risk of osteoporosis can be identified using a bone density scan. Women ages 30 years and over and women at risk for fractures or osteoporosis should discuss screening with their health care providers. Ask your health care provider whether you should take a calcium supplement or vitamin D to reduce the rate of osteoporosis.  Menopause can be associated with physical symptoms and risks. Hormone replacement therapy is available to decrease symptoms and risks. You should talk to your health care provider about whether hormone replacement therapy is right for you.  Use sunscreen. Apply sunscreen liberally and repeatedly throughout the day. You should seek shade when your shadow is shorter than you. Protect yourself by wearing long sleeves, pants, a wide-brimmed  hat, and sunglasses year round, whenever you are outdoors.  Once a month, do a whole body skin exam, using a mirror to look at the skin on your back. Tell your health care provider of new moles, moles that have irregular borders, moles that are larger than a pencil eraser, or moles that have changed in shape or color.  Stay current with required vaccines (immunizations).  Influenza vaccine. All adults should be immunized every year.  Tetanus, diphtheria, and acellular pertussis (Td, Tdap) vaccine. Pregnant women should receive 1 dose of Tdap vaccine during each pregnancy. The dose should be obtained regardless of the length of time since the last dose. Immunization is preferred during the 27th-36th week of gestation. An adult who has not previously received Tdap or who does not know her vaccine status should receive 1 dose of Tdap. This initial dose should be followed by tetanus and diphtheria toxoids (Td) booster doses every 10 years. Adults with an unknown or incomplete history of completing a 3-dose immunization series with Td-containing vaccines should begin or complete a primary immunization series including a Tdap dose. Adults should receive a Td booster every 10 years.  Varicella vaccine. An adult without evidence of immunity to varicella should receive 2 doses or a second dose if she has previously received 1 dose. Pregnant females who do not have evidence of immunity should receive the first dose after pregnancy. This first dose should be obtained before leaving the health care facility. The second dose should be obtained 4-8 weeks after the first dose.  Human papillomavirus (HPV) vaccine. Females aged 13-26 years who have not received the vaccine previously should obtain the 3-dose series. The vaccine is not recommended for use in pregnant females. However, pregnancy testing is not needed before receiving a dose. If a female is found to be pregnant after receiving a dose, no treatment is  needed. In that case, the remaining doses should be delayed until after the pregnancy. Immunization is recommended for any person with an immunocompromised condition through the age of 25 years if she did not get any or all doses earlier. During the 3-dose series, the second dose should be obtained 4-8 weeks after the  first dose. The third dose should be obtained 24 weeks after the first dose and 16 weeks after the second dose.  Zoster vaccine. One dose is recommended for adults aged 49 years or older unless certain conditions are present.  Measles, mumps, and rubella (MMR) vaccine. Adults born before [birth year] generally are considered immune to measles and mumps. Adults born in [birth year] or later should have 1 or more doses of MMR vaccine unless there is a contraindication to the vaccine or there is laboratory evidence of immunity to each of the three diseases. A routine second dose of MMR vaccine should be obtained at least 28 days after the first dose for students attending postsecondary schools, health care workers, or international travelers. People who received inactivated measles vaccine or an unknown type of measles vaccine during 1963-1967 should receive 2 doses of MMR vaccine. People who received inactivated mumps vaccine or an unknown type of mumps vaccine before 1979 and are at high risk for mumps infection should consider immunization with 2 doses of MMR vaccine. For females of childbearing age, rubella immunity should be determined. If there is no evidence of immunity, females who are not pregnant should be vaccinated. If there is no evidence of immunity, females who are pregnant should delay immunization until after pregnancy. Unvaccinated health care workers born before [birth year] who lack laboratory evidence of measles, mumps, or rubella immunity or laboratory confirmation of disease should consider measles and mumps immunization with 2 doses of MMR vaccine or rubella immunization with 1 dose of MMR  vaccine.  Pneumococcal 13-valent conjugate (PCV13) vaccine. When indicated, a person who is uncertain of his immunization history and has no record of immunization should receive the PCV13 vaccine. All adults 30 years of age and older should receive this vaccine. An adult aged 10 years or older who has certain medical conditions and has not been previously immunized should receive 1 dose of PCV13 vaccine. This PCV13 should be followed with a dose of pneumococcal polysaccharide (PPSV23) vaccine. Adults who are at high risk for pneumococcal disease should obtain the PPSV23 vaccine at least 8 weeks after the dose of PCV13 vaccine. Adults older than 72 years of age who have normal immune system function should obtain the PPSV23 vaccine dose at least 1 year after the dose of PCV13 vaccine.  Pneumococcal polysaccharide (PPSV23) vaccine. When PCV13 is also indicated, PCV13 should be obtained first. All adults aged 53 years and older should be immunized. An adult younger than age 36 years who has certain medical conditions should be immunized. Any person who resides in a nursing home or long-term care facility should be immunized. An adult smoker should be immunized. People with an immunocompromised condition and certain other conditions should receive both PCV13 and PPSV23 vaccines. People with human immunodeficiency virus (HIV) infection should be immunized as soon as possible after diagnosis. Immunization during chemotherapy or radiation therapy should be avoided. Routine use of PPSV23 vaccine is not recommended for American Indians, [place] Natives, or people younger than 65 years unless there are medical conditions that require PPSV23 vaccine. When indicated, people who have unknown immunization and have no record of immunization should receive PPSV23 vaccine. One-time revaccination 5 years after the first dose of PPSV23 is recommended for people aged 19-64 years who have chronic kidney failure, nephrotic syndrome,  asplenia, or immunocompromised conditions. People who received 1-2 doses of PPSV23 before age 32 years should receive another dose of PPSV23 vaccine at age 34 years or later if at least 5 years  have passed since the previous dose. Doses of PPSV23 are not needed for people immunized with PPSV23 at or after age 10 years.  Meningococcal vaccine. Adults with asplenia or persistent complement component deficiencies should receive 2 doses of quadrivalent meningococcal conjugate (MenACWY-D) vaccine. The doses should be obtained at least 2 months apart. Microbiologists working with certain meningococcal bacteria, [employer] recruits, people at risk during an outbreak, and people who travel to or live in countries with a high rate of meningitis should be immunized. A first-year college student up through age 32 years who is living in a residence hall should receive a dose if she did not receive a dose on or after her 16th birthday. Adults who have certain high-risk conditions should receive one or more doses of vaccine.  Hepatitis A vaccine. Adults who wish to be protected from this disease, have certain high-risk conditions, work with hepatitis A-infected animals, work in hepatitis A research labs, or travel to or work in countries with a high rate of hepatitis A should be immunized. Adults who were previously unvaccinated and who anticipate close contact with an international adoptee during the first 60 days after arrival in the [place] from a country with a high rate of hepatitis A should be immunized.  Hepatitis B vaccine. Adults who wish to be protected from this disease, have certain high-risk conditions, may be exposed to blood or other infectious body fluids, are household contacts or sex partners of hepatitis B positive people, are clients or workers in certain care facilities, or travel to or work in countries with a high rate of hepatitis B should be immunized.  Haemophilus influenzae type b (Hib)  vaccine. A previously unvaccinated person with asplenia or sickle cell disease or having a scheduled splenectomy should receive 1 dose of Hib vaccine. Regardless of previous immunization, a recipient of a hematopoietic stem cell transplant should receive a 3-dose series 6-12 months after her successful transplant. Hib vaccine is not recommended for adults with HIV infection. Preventive Services / Frequency Ages 25 to 70 years  Blood pressure check.** / Every 3-5 years.  Lipid and cholesterol check.** / Every 5 years beginning at age 15.  Clinical breast exam.** / Every 3 years for women in their 71s and 19s.  BRCA-related cancer risk assessment.** / For women who have family members with a BRCA-related cancer (breast, ovarian, tubal, or peritoneal cancers).  Pap test.** / Every 2 years from ages 50 through 31. Every 3 years starting at age 9 through age 19 or 61 with a history of 3 consecutive normal Pap tests.  HPV screening.** / Every 3 years from ages 9 through ages 12 to 21 with a history of 3 consecutive normal Pap tests.  Hepatitis C blood test.** / For any individual with known risks for hepatitis C.  Skin self-exam. / Monthly.  Influenza vaccine. / Every year.  Tetanus, diphtheria, and acellular pertussis (Tdap, Td) vaccine.** / Consult your health care provider. Pregnant women should receive 1 dose of Tdap vaccine during each pregnancy. 1 dose of Td every 10 years.  Varicella vaccine.** / Consult your health care provider. Pregnant females who do not have evidence of immunity should receive the first dose after pregnancy.  HPV vaccine. / 3 doses over 6 months, if 46 and younger. The vaccine is not recommended for use in pregnant females. However, pregnancy testing is not needed before receiving a dose.  Measles, mumps, rubella (MMR) vaccine.** / You need at least 1 dose of MMR if  you were born in [birth year] or later. You may also need a 2nd dose. For females of childbearing age,  rubella immunity should be determined. If there is no evidence of immunity, females who are not pregnant should be vaccinated. If there is no evidence of immunity, females who are pregnant should delay immunization until after pregnancy.  Pneumococcal 13-valent conjugate (PCV13) vaccine.** / Consult your health care provider.  Pneumococcal polysaccharide (PPSV23) vaccine.** / 1 to 2 doses if you smoke cigarettes or if you have certain conditions.  Meningococcal vaccine.** / 1 dose if you are age 7 to 69 years and a Market researcher living in a residence hall, or have one of several medical conditions, you need to get vaccinated against meningococcal disease. You may also need additional booster doses.  Hepatitis A vaccine.** / Consult your health care provider.  Hepatitis B vaccine.** / Consult your health care provider.  Haemophilus influenzae type b (Hib) vaccine.** / Consult your health care provider. Ages 67 to 58 years  Blood pressure check.** / Every year.  Lipid and cholesterol check.** / Every 5 years beginning at age 61 years.  Lung cancer screening. / Every year if you are aged 54-80 years and have a 30-pack-year history of smoking and currently smoke or have quit within the past 15 years. Yearly screening is stopped once you have quit smoking for at least 15 years or develop a health problem that would prevent you from having lung cancer treatment.  Clinical breast exam.** / Every year after age 51 years.  BRCA-related cancer risk assessment.** / For women who have family members with a BRCA-related cancer (breast, ovarian, tubal, or peritoneal cancers).  Mammogram.** / Every year beginning at age 82 years and continuing for as long as you are in good health. Consult with your health care provider.  Pap test.** / Every 3 years starting at age 29 years through age 10 or 35 years with a history of 3 consecutive normal Pap tests.  HPV screening.** / Every 3 years from  ages 33 years through ages 55 to 9 years with a history of 3 consecutive normal Pap tests.  Fecal occult blood test (FOBT) of stool. / Every year beginning at age 14 years and continuing until age 37 years. You may not need to do this test if you get a colonoscopy every 10 years.  Flexible sigmoidoscopy or colonoscopy.** / Every 5 years for a flexible sigmoidoscopy or every 10 years for a colonoscopy beginning at age 48 years and continuing until age 72 years.  Hepatitis C blood test.** / For all people born from [birth year] through 1967 and any individual with known risks for hepatitis C.  Skin self-exam. / Monthly.  Influenza vaccine. / Every year.  Tetanus, diphtheria, and acellular pertussis (Tdap/Td) vaccine.** / Consult your health care provider. Pregnant women should receive 1 dose of Tdap vaccine during each pregnancy. 1 dose of Td every 10 years.  Varicella vaccine.** / Consult your health care provider. Pregnant females who do not have evidence of immunity should receive the first dose after pregnancy.  Zoster vaccine.** / 1 dose for adults aged 36 years or older.  Measles, mumps, rubella (MMR) vaccine.** / You need at least 1 dose of MMR if you were born in 1957 or later. You may also need a second dose. For females of childbearing age, rubella immunity should be determined. If there is no evidence of immunity, females who are not pregnant should be vaccinated. If there is no  evidence of immunity, females who are pregnant should delay immunization until after pregnancy.  Pneumococcal 13-valent conjugate (PCV13) vaccine.** / Consult your health care provider.  Pneumococcal polysaccharide (PPSV23) vaccine.** / 1 to 2 doses if you smoke cigarettes or if you have certain conditions.  Meningococcal vaccine.** / Consult your health care provider.  Hepatitis A vaccine.** / Consult your health care provider.  Hepatitis B vaccine.** / Consult your health care provider.  Haemophilus  influenzae type b (Hib) vaccine.** / Consult your health care provider. Ages 54 years and over  Blood pressure check.** / Every year.  Lipid and cholesterol check.** / Every 5 years beginning at age 70 years.  Lung cancer screening. / Every year if you are aged 60-80 years and have a 30-pack-year history of smoking and currently smoke or have quit within the past 15 years. Yearly screening is stopped once you have quit smoking for at least 15 years or develop a health problem that would prevent you from having lung cancer treatment.  Clinical breast exam.** / Every year after age 6 years.  BRCA-related cancer risk assessment.** / For women who have family members with a BRCA-related cancer (breast, ovarian, tubal, or peritoneal cancers).  Mammogram.** / Every year beginning at age 51 years and continuing for as long as you are in good health. Consult with your health care provider.  Pap test.** / Every 3 years starting at age 39 years through age 72 or 70 years with 3 consecutive normal Pap tests. Testing can be stopped between 65 and 70 years with 3 consecutive normal Pap tests and no abnormal Pap or HPV tests in the past 10 years.  HPV screening.** / Every 3 years from ages 68 years through ages 81 or 56 years with a history of 3 consecutive normal Pap tests. Testing can be stopped between 65 and 70 years with 3 consecutive normal Pap tests and no abnormal Pap or HPV tests in the past 10 years.  Fecal occult blood test (FOBT) of stool. / Every year beginning at age 58 years and continuing until age 88 years. You may not need to do this test if you get a colonoscopy every 10 years.  Flexible sigmoidoscopy or colonoscopy.** / Every 5 years for a flexible sigmoidoscopy or every 10 years for a colonoscopy beginning at age 37 years and continuing until age 64 years.  Hepatitis C blood test.** / For all people born from [birth year] through 1967 and any individual with known risks for hepatitis  C.  Osteoporosis screening.** / A one-time screening for women ages 70 years and over and women at risk for fractures or osteoporosis.  Skin self-exam. / Monthly.  Influenza vaccine. / Every year.  Tetanus, diphtheria, and acellular pertussis (Tdap/Td) vaccine.** / 1 dose of Td every 10 years.  Varicella vaccine.** / Consult your health care provider.  Zoster vaccine.** / 1 dose for adults aged 56 years or older.  Pneumococcal 13-valent conjugate (PCV13) vaccine.** / Consult your health care provider.  Pneumococcal polysaccharide (PPSV23) vaccine.** / 1 dose for all adults aged 3 years and older.  Meningococcal vaccine.** / Consult your health care provider.  Hepatitis A vaccine.** / Consult your health care provider.  Hepatitis B vaccine.** / Consult your health care provider.  Haemophilus influenzae type b (Hib) vaccine.** / Consult your health care provider. ** Family history and personal history of risk and conditions may change your health care provider's recommendations.   This information is not intended to replace advice given to  you by your health care provider. Make sure you discuss any questions you have with your health care provider.   Document Released: 08/31/2001 Document Revised: 07/26/2014 Document Reviewed: 11/30/2010 Elsevier Interactive Patient Education [employer].

## 2016-04-08 NOTE — Progress Notes (Signed)
Pre visit review using our clinic review tool, if applicable. No additional management support is needed unless otherwise documented below in the visit note. 

## 2016-04-09 LAB — RHEUMATOID FACTOR

## 2016-04-17 NOTE — Assessment & Plan Note (Signed)
Encouraged to get adequate exercise, calcium and vitamin d intake 

## 2016-04-17 NOTE — Progress Notes (Signed)
Patient ID: Eileen Hansen, female   DOB: [birth year], 70 y.o.   MRN: [id number]   Subjective:    Patient ID: Eileen Hansen, female    DOB: 26-Mar-1946, 70 y.o.   MRN: [id number]  Chief Complaint  Patient presents with  . Annual Exam    HPI Patient is in today for annual preventative exam and follow up on chronic medical concerns. No recent illness or recent hospitalization. Is struggling with left hip pain which is worse when she lies down at night. She has trouble to get comfortable and sleep. Denies CP/palp/SOB/HA/congestion/fevers/GI or GU c/o. Taking meds as prescribed. Doing well with ADLs and trying to maintain a heart healthy diet.   Past Medical History:  Diagnosis Date  . Allergic rhinitis   . Arthralgia 09/30/2015  . Epistaxis 09/30/2015  . Globus sensation 02/23/2015  . HTN (hypertension)   . Left hip pain 09/30/2015  . Medicare annual wellness visit, subsequent 04/14/2015  . Tachycardia    Episodic    Past Surgical History:  Procedure Laterality Date  . APPENDECTOMY  70 yrs old  . BUNIONECTOMY    . WISDOM TOOTH EXTRACTION  70 yrs old    Family History  Problem Relation Age of Onset  . Cancer Father     melanoma  . Diabetes Mother   . Hypertension Mother   . Hyperlipidemia Mother   . Stroke Mother   . Heart attack Mother   . Heart attack Brother   . Cancer Brother     pancreatic  . Colon cancer Neg Hx   . Breast cancer Neg Hx   . Coronary artery disease Neg Hx     Social History   Social History  . Marital status: Married    Spouse name: N/A  . Number of children: 1  . Years of education: N/A   Occupational History  . Homemaker Retired   Social History Main Topics  . Smoking status: Never Smoker  . Smokeless tobacco: Never Used  . Alcohol use Yes     Comment: wine  . Drug use: No  . Sexual activity: Yes     Comment: lives with husband   Other Topics Concern  . Not on file   Social History Narrative   University - [place], Masters Degree -  counseling, [employer]   Married '69 - 7 years, divorced; married '82   21 daughter - '72; 2 grandchildren      Regular Exercise -  YES          Outpatient Medications Prior to Visit  Medication Sig Dispense Refill  . aspirin 81 MG tablet Take 81 mg by mouth daily.    Marland Kitchen b complex vitamins tablet Take 1 tablet by mouth daily.      . Cholecalciferol (VITAMIN D3) 3000 units TABS Take by mouth.    . Glucosamine HCl (GLUCOSAMINE PO) Take 2 tablets by mouth.     . hydrochlorothiazide (MICROZIDE) 12.5 MG capsule TAKE 1 CAPSULE (12.5 MG TOTAL) BY MOUTH DAILY. 90 capsule 1  . lisinopril (PRINIVIL,ZESTRIL) 10 MG tablet TAKE 2 TABLETS BY MOUTH DAILY. 180 tablet 1  . metoprolol tartrate (LOPRESSOR) 25 MG tablet Take 0.5 tablets (12.5 mg total) by mouth 2 (two) times daily. 90 tablet 1  . Multiple Vitamin (MULTIVITAMIN) tablet Take 1 tablet by mouth daily. [employer]    . mupirocin ointment (BACTROBAN) 2 % Place 1 application into the nose at bedtime as needed. Apply via to each nare  at bedtime times 7 day. 30 g 0  . RA KRILL OIL 500 MG CAPS Take 1 capsule by mouth daily. Reported on 09/30/2015    . zoster vaccine live, PF, (ZOSTAVAX) [id number] UNT/0.65ML injection Inject 19,400 Units into the skin once. 1 each 0  . chlorpheniramine-HYDROcodone (TUSSIONEX) 10-8 MG/5ML SUER Take 5 mLs by mouth every 12 (twelve) hours as needed for cough. (Patient not taking: Reported on 09/30/2015) 115 mL 0  . metoprolol tartrate (LOPRESSOR) 25 MG tablet TAKE 1 TABLET BY MOUTH ONCE DAILY 90 tablet 1   No facility-administered medications prior to visit.     Allergies  Allergen Reactions  . Penicillins     REACTION: Hives Anaphylaxis    Review of Systems  Constitutional: Positive for malaise/fatigue. Negative for chills and fever.  HENT: Negative for congestion and hearing loss.   Eyes: Negative for discharge.  Respiratory: Negative for cough, sputum production and shortness of breath.     Cardiovascular: Negative for chest pain, palpitations and leg swelling.  Gastrointestinal: Negative for abdominal pain, blood in stool, constipation, diarrhea, heartburn, nausea and vomiting.  Genitourinary: Negative for dysuria, frequency, hematuria and urgency.  Musculoskeletal: Positive for back pain and joint pain. Negative for falls and myalgias.  Skin: Negative for rash.  Neurological: Negative for dizziness, sensory change, loss of consciousness, weakness and headaches.  Endo/Heme/Allergies: Negative for environmental allergies. Does not bruise/bleed easily.  Psychiatric/Behavioral: Negative for depression and suicidal ideas. The patient is not nervous/anxious and does not have insomnia.        Objective:    Physical Exam  Constitutional: She is oriented to person, place, and time. She appears well-developed and well-nourished. No distress.  HENT:  Head: Normocephalic and atraumatic.  Eyes: Conjunctivae are normal.  Neck: Neck supple. No thyromegaly present.  Cardiovascular: Normal rate, regular rhythm and normal heart sounds.   No murmur heard. Pulmonary/Chest: Effort normal and breath sounds normal. No respiratory distress.  Abdominal: Soft. Bowel sounds are normal. She exhibits no distension and no mass. There is no tenderness.  Musculoskeletal: She exhibits no edema.  Lymphadenopathy:    She has no cervical adenopathy.  Neurological: She is alert and oriented to person, place, and time.  Skin: Skin is warm and dry.  Psychiatric: She has a normal mood and affect. Her behavior is normal.    BP 132/82 (BP Location: Left Arm, Patient Position: Sitting, Cuff Size: Normal)   Pulse 66   Temp 98.2 F (36.8 C) (Oral)   Ht 5' (1.524 m)   Wt 127 lb 2 oz (57.7 kg)   SpO2 94%   BMI 24.83 kg/m  Wt Readings from Last 3 Encounters:  04/08/16 127 lb 2 oz (57.7 kg)  09/30/15 126 lb 12.8 oz (57.5 kg)  06/17/15 124 lb 8 oz (56.5 kg)     Lab Results  Component Value Date    WBC 7.1 04/02/2016   HGB 13.4 04/02/2016   HCT 38.0 04/02/2016   PLT 238.0 04/02/2016   GLUCOSE 107 (H) 04/02/2016   CHOL 163 04/02/2016   TRIG 238.0 (H) 04/02/2016   HDL 26.30 (L) 04/02/2016   LDLDIRECT 101.0 04/02/2016   LDLCALC 95 11/14/2014   ALT 13 04/02/2016   AST 15 04/02/2016   NA 141 04/02/2016   K 4.1 04/02/2016   CL 106 04/02/2016   CREATININE 0.70 04/02/2016   BUN 18 04/02/2016   CO2 29 04/02/2016   TSH 2.06 04/02/2016   HGBA1C 6.1 09/23/2015   MICROALBUR <0.7 04/02/2016  Lab Results  Component Value Date   TSH 2.06 04/02/2016   Lab Results  Component Value Date   WBC 7.1 04/02/2016   HGB 13.4 04/02/2016   HCT 38.0 04/02/2016   MCV 88.1 04/02/2016   PLT 238.0 04/02/2016   Lab Results  Component Value Date   NA 141 04/02/2016   K 4.1 04/02/2016   CO2 29 04/02/2016   GLUCOSE 107 (H) 04/02/2016   BUN 18 04/02/2016   CREATININE 0.70 04/02/2016   BILITOT 0.4 04/02/2016   ALKPHOS 59 04/02/2016   AST 15 04/02/2016   ALT 13 04/02/2016   PROT 6.9 04/02/2016   ALBUMIN 4.1 04/02/2016   CALCIUM 9.4 04/02/2016   ANIONGAP 10 10/17/2014   GFR 87.41 04/02/2016   Lab Results  Component Value Date   CHOL 163 04/02/2016   Lab Results  Component Value Date   HDL 26.30 (L) 04/02/2016   Lab Results  Component Value Date   LDLCALC 95 11/14/2014   Lab Results  Component Value Date   TRIG 238.0 (H) 04/02/2016   Lab Results  Component Value Date   CHOLHDL 6 04/02/2016   Lab Results  Component Value Date   HGBA1C 6.1 09/23/2015       Assessment & Plan:   Problem List Items Addressed This Visit    Essential hypertension    Well controlled, no changes to meds. Encouraged heart healthy diet such as the DASH diet and exercise as tolerated.       Hyperlipidemia, mixed    Encouraged heart healthy diet, increase exercise, avoid trans fats, consider a krill oil cap daily      Preventative health care    Patient encouraged to maintain heart healthy  diet, regular exercise, adequate sleep. Consider daily probiotics. Take medications as prescribed. Patient encouraged to maintain heart healthy diet, regular exercise, adequate sleep. Consider daily probiotics. Take medications as prescribed      Borderline diabetes     minimize simple carbs. Increase exercise as tolerated.       Osteopenia    Encouraged to get adequate exercise, calcium and vitamin d intake       Other Visit Diagnoses    Hyperlipidemia    -  Primary   Relevant Orders   Lipid panel   Comprehensive metabolic panel   Encounter for immunization       Relevant Medications   calcium carbonate (OSCAL) 1500 (600 Ca) MG TABS tablet   glucosamine-chondroitin 500-400 MG tablet   Other Relevant Orders   Flu vaccine HIGH DOSE PF (Completed)   Lipid panel   Comprehensive metabolic panel   Hemoglobin A1c   Hyperglycemia       Relevant Orders   Lipid panel   Comprehensive metabolic panel   Hemoglobin A1c   Joint pain       Relevant Orders   Rheumatoid Factor (Completed)      I have discontinued Ms. Keeran's chlorpheniramine-HYDROcodone. I am also having her maintain her b complex vitamins, aspirin, multivitamin, metoprolol tartrate, RA KRILL OIL, Vitamin D3, Glucosamine HCl (GLUCOSAMINE PO), mupirocin ointment, zoster vaccine live (PF), hydrochlorothiazide, lisinopril, calcium carbonate, and glucosamine-chondroitin.  Meds ordered this encounter  Medications  . calcium carbonate (OSCAL) 1500 (600 Ca) MG TABS tablet    Sig: Take by mouth 2 (two) times daily with a meal.  . glucosamine-chondroitin 500-400 MG tablet    Sig: Take 1 tablet by mouth 2 (two) times daily.     Danise Edge, MD

## 2016-04-17 NOTE — Assessment & Plan Note (Signed)
minimize simple carbs. Increase exercise as tolerated.  

## 2016-04-17 NOTE — Assessment & Plan Note (Signed)
Encouraged heart healthy diet, increase exercise, avoid trans fats, consider a krill oil cap daily 

## 2016-04-17 NOTE — Assessment & Plan Note (Signed)
Well controlled, no changes to meds. Encouraged heart healthy diet such as the DASH diet and exercise as tolerated.  °

## 2016-04-17 NOTE — Assessment & Plan Note (Signed)
Patient encouraged to maintain heart healthy diet, regular exercise, adequate sleep. Consider daily probiotics. Take medications as prescribed. Patient encouraged to maintain heart healthy diet, regular exercise, adequate sleep. Consider daily probiotics. Take medications as prescribed 

## 2016-06-30 ENCOUNTER — Other Ambulatory Visit: Payer: Self-pay | Admitting: Family Medicine

## 2016-07-19 HISTORY — PX: OTHER SURGICAL HISTORY: SHX169

## 2016-07-26 ENCOUNTER — Encounter: Payer: Self-pay | Admitting: Family Medicine

## 2016-07-26 ENCOUNTER — Ambulatory Visit (INDEPENDENT_AMBULATORY_CARE_PROVIDER_SITE_OTHER): Payer: Medicare Other | Admitting: Family Medicine

## 2016-07-26 VITALS — BP 120/68 | HR 65 | Temp 98.3°F | Wt 126.6 lb

## 2016-07-26 DIAGNOSIS — R0989 Other specified symptoms and signs involving the circulatory and respiratory systems: Secondary | ICD-10-CM

## 2016-07-26 DIAGNOSIS — R040 Epistaxis: Secondary | ICD-10-CM

## 2016-07-26 DIAGNOSIS — R51 Headache: Secondary | ICD-10-CM

## 2016-07-26 DIAGNOSIS — R0981 Nasal congestion: Secondary | ICD-10-CM

## 2016-07-26 DIAGNOSIS — M25562 Pain in left knee: Secondary | ICD-10-CM

## 2016-07-26 DIAGNOSIS — F458 Other somatoform disorders: Secondary | ICD-10-CM | POA: Diagnosis not present

## 2016-07-26 DIAGNOSIS — J029 Acute pharyngitis, unspecified: Secondary | ICD-10-CM | POA: Diagnosis not present

## 2016-07-26 DIAGNOSIS — M25561 Pain in right knee: Secondary | ICD-10-CM

## 2016-07-26 DIAGNOSIS — I10 Essential (primary) hypertension: Secondary | ICD-10-CM

## 2016-07-26 DIAGNOSIS — K5900 Constipation, unspecified: Secondary | ICD-10-CM

## 2016-07-26 DIAGNOSIS — R519 Headache, unspecified: Secondary | ICD-10-CM

## 2016-07-26 MED ORDER — RANITIDINE HCL 300 MG PO TABS: 300.0000 mg | ORAL_TABLET | Freq: Every day | ORAL | 2 refills | Status: DC

## 2016-07-26 NOTE — Patient Instructions (Signed)
Encouraged increased hydration and fiber in diet. Daily probiotics. If bowels not moving can use MOM 2 tbls po in 4 oz of warm prune juice by mouth every 2-3 days. If no results then repeat in 4 hours with  Dulcolax suppository pr, may repeat again in 4 more hours as needed. Seek care if symptoms worsen. Consider daily Miralax and/or Dulcolax if symptoms persist.  Fiber Benefiber twice a day and probiotic daily NOW company 10 strain cap 1 daily Sinusitis, Adult Sinusitis is soreness and inflammation of your sinuses. Sinuses are hollow spaces in the bones around your face. They are located:  Around your eyes.  In the middle of your forehead.  Behind your nose.  In your cheekbones. Your sinuses and nasal passages are lined with a stringy fluid (mucus). Mucus normally drains out of your sinuses. When your nasal tissues get inflamed or swollen, the mucus can get trapped or blocked so air cannot flow through your sinuses. This lets bacteria, viruses, and funguses grow, and that leads to infection. Follow these instructions at home: Medicines  Take, use, or apply over-the-counter and prescription medicines only as told by your doctor. These may include nasal sprays.  If you were prescribed an antibiotic medicine, take it as told by your doctor. Do not stop taking the antibiotic even if you start to feel better. Hydrate and Humidify  Drink enough water to keep your pee (urine) clear or pale yellow.  Use a cool mist humidifier to keep the humidity level in your home above 50%.  Breathe in steam for 10-15 minutes, 3-4 times a day or as told by your doctor. You can do this in the bathroom while a hot shower is running.  Try not to spend time in cool or dry air. Rest  Rest as much as possible.  Sleep with your head raised (elevated).  Make sure to get enough sleep each night. General instructions  Put a warm, moist washcloth on your face 3-4 times a day or as told by your doctor. This will  help with discomfort.  Wash your hands often with soap and water. If there is no soap and water, use hand sanitizer.  Do not smoke. Avoid being around people who are smoking (secondhand smoke).  Keep all follow-up visits as told by your doctor. This is important. Contact a doctor if:  You have a fever.  Your symptoms get worse.  Your symptoms do not get better within 10 days. Get help right away if:  You have a very bad headache.  You cannot stop throwing up (vomiting).  You have pain or swelling around your face or eyes.  You have trouble seeing.  You feel confused.  Your neck is stiff.  You have trouble breathing. This information is not intended to replace advice given to you by your health care provider. Make sure you discuss any questions you have with your health care provider. Document Released: 12/22/2007 Document Revised: 02/29/2016 Document Reviewed: 04/30/2015 Elsevier Interactive Patient Education  2017 [employer]

## 2016-07-26 NOTE — Progress Notes (Signed)
Pre visit review using our clinic review tool, if applicable. No additional management support is needed unless otherwise documented below in the visit note. 

## 2016-07-27 NOTE — Assessment & Plan Note (Signed)
Given rx for Ranitidine and discussed liklihood of silent heartburn referred to GI for consideration

## 2016-07-27 NOTE — Assessment & Plan Note (Signed)
interrrmittent with right sided headache, chronic nasal congestion and history of facial trauma over right maxillary sinus. Referred to ENT for evaluation of theses symptoms and sense of globus. Also proceed with CT sinuses

## 2016-07-27 NOTE — Assessment & Plan Note (Signed)
Most notable in knees, is staying active, condinue glucosamine, consider curcumen. Report if symptoms worsen

## 2016-07-27 NOTE — Progress Notes (Signed)
Patient ID: Eileen Hansen, female   DOB: 12-Dec-1945, 71 y.o.   MRN: [id number]   Subjective:    Patient ID: Eileen Hansen, female    DOB: 05/27/1946, 71 y.o.   MRN: [id number]  Chief Complaint  Patient presents with  . Sore Throat    HPI Patient is in today for evaluation of persistent sense of globus and intermittent sore throat. She also endorses right sided headache, nasal congestion with sputum production. She fell about a year ago and took a blow to her right maxillary sinus and she has felt increased pain and congestion since then. She denies any GI concerns. No change in bowel function, appetite and nausea/vomiting. Denies CP/palp/SOB/fevers or GU c/o. Taking meds as prescribed  Past Medical History:  Diagnosis Date  . Allergic rhinitis   . Arthralgia 09/30/2015  . Epistaxis 09/30/2015  . Globus sensation 02/23/2015  . HTN (hypertension)   . Left hip pain 09/30/2015  . Medicare annual wellness visit, subsequent 04/14/2015  . Tachycardia    Episodic    Past Surgical History:  Procedure Laterality Date  . APPENDECTOMY  71 yrs old  . BUNIONECTOMY    . WISDOM TOOTH EXTRACTION  71 yrs old    Family History  Problem Relation Age of Onset  . Cancer Father     melanoma  . Diabetes Mother   . Hypertension Mother   . Hyperlipidemia Mother   . Stroke Mother   . Heart attack Mother   . Heart attack Brother   . Cancer Brother     pancreatic  . Colon cancer Neg Hx   . Breast cancer Neg Hx   . Coronary artery disease Neg Hx     Social History   Social History  . Marital status: Married    Spouse name: N/A  . Number of children: 1  . Years of education: N/A   Occupational History  . Homemaker Retired   Social History Main Topics  . Smoking status: Never Smoker  . Smokeless tobacco: Never Used  . Alcohol use Yes     Comment: wine  . Drug use: No  . Sexual activity: Yes     Comment: lives with husband   Other Topics Concern  . Not on file   Social History Narrative    University - [place], Masters Degree - counseling, [employer]   Married '69 - 7 years, divorced; married '82   21 daughter - '72; 2 grandchildren      Regular Exercise -  YES          Outpatient Medications Prior to Visit  Medication Sig Dispense Refill  . aspirin 81 MG tablet Take 81 mg by mouth daily.    . Glucosamine HCl (GLUCOSAMINE PO) Take 2 tablets by mouth.     . hydrochlorothiazide (MICROZIDE) 12.5 MG capsule TAKE 1 CAPSULE (12.5 MG TOTAL) BY MOUTH DAILY. 90 capsule 1  . lisinopril (PRINIVIL,ZESTRIL) 10 MG tablet TAKE 2 TABLETS BY MOUTH DAILY. 180 tablet 1  . metoprolol tartrate (LOPRESSOR) 25 MG tablet Take 0.5 tablets (12.5 mg total) by mouth 2 (two) times daily. 90 tablet 1  . Multiple Vitamin (MULTIVITAMIN) tablet Take 1 tablet by mouth daily. Nature's Brand    . RA KRILL OIL 500 MG CAPS Take 1 capsule by mouth daily. Reported on 09/30/2015    . b complex vitamins tablet Take 1 tablet by mouth daily.      . calcium carbonate (OSCAL) 1500 (600 Ca) MG  TABS tablet Take by mouth 2 (two) times daily with a meal.    . zoster vaccine live, PF, (ZOSTAVAX) 16109 UNT/0.65ML injection Inject 19,400 Units into the skin once. 1 each 0  . Cholecalciferol (VITAMIN D3) 3000 units TABS Take by mouth.    Marland Kitchen glucosamine-chondroitin 500-400 MG tablet Take 1 tablet by mouth 2 (two) times daily.    . mupirocin ointment (BACTROBAN) 2 % Place 1 application into the nose at bedtime as needed. Apply via to each nare at bedtime times 7 day. 30 g 0   No facility-administered medications prior to visit.     Allergies  Allergen Reactions  . Penicillins     REACTION: Hives Anaphylaxis    Review of Systems  Constitutional: Negative for fever and malaise/fatigue.  HENT: Positive for congestion, nosebleeds and sore throat.   Eyes: Negative for blurred vision.  Respiratory: Positive for sputum production. Negative for shortness of breath.   Cardiovascular: Negative for chest pain,  palpitations and leg swelling.  Gastrointestinal: Negative for abdominal pain, blood in stool and nausea.  Genitourinary: Negative for dysuria and frequency.  Musculoskeletal: Positive for joint pain. Negative for falls.  Skin: Negative for rash.  Neurological: Positive for headaches. Negative for dizziness and loss of consciousness.  Endo/Heme/Allergies: Negative for environmental allergies.  Psychiatric/Behavioral: Negative for depression. The patient is not nervous/anxious.        Objective:    Physical Exam  Constitutional: She is oriented to person, place, and time. She appears well-developed and well-nourished. No distress.  HENT:  Head: Normocephalic and atraumatic.  Nose: Nose normal.  Nasal mucosa erythematous and boggy  Eyes: Right eye exhibits no discharge. Left eye exhibits no discharge.  Neck: Normal range of motion. Neck supple.  Cardiovascular: Normal rate and regular rhythm.   No murmur heard. Pulmonary/Chest: Effort normal and breath sounds normal.  Abdominal: Soft. Bowel sounds are normal. She exhibits no mass. There is no tenderness.  Musculoskeletal: She exhibits no edema.  Neurological: She is alert and oriented to person, place, and time.  Skin: Skin is warm and dry.  Psychiatric: She has a normal mood and affect.  Nursing note and vitals reviewed.   BP 120/68 (BP Location: Left Arm, Patient Position: Sitting, Cuff Size: Normal)   Pulse 65   Temp 98.3 F (36.8 C) (Oral)   Wt 126 lb 9.6 oz (57.4 kg)   SpO2 95%   BMI 24.72 kg/m  Wt Readings from Last 3 Encounters:  07/26/16 126 lb 9.6 oz (57.4 kg)  04/08/16 127 lb 2 oz (57.7 kg)  09/30/15 126 lb 12.8 oz (57.5 kg)     Lab Results  Component Value Date   WBC 7.1 04/02/2016   HGB 13.4 04/02/2016   HCT 38.0 04/02/2016   PLT 238.0 04/02/2016   GLUCOSE 107 (H) 04/02/2016   CHOL 163 04/02/2016   TRIG 238.0 (H) 04/02/2016   HDL 26.30 (L) 04/02/2016   LDLDIRECT 101.0 04/02/2016   LDLCALC 95  11/14/2014   ALT 13 04/02/2016   AST 15 04/02/2016   NA 141 04/02/2016   K 4.1 04/02/2016   CL 106 04/02/2016   CREATININE 0.70 04/02/2016   BUN 18 04/02/2016   CO2 29 04/02/2016   TSH 2.06 04/02/2016   HGBA1C 6.1 09/23/2015   MICROALBUR <0.7 04/02/2016    Lab Results  Component Value Date   TSH 2.06 04/02/2016   Lab Results  Component Value Date   WBC 7.1 04/02/2016   HGB 13.4 04/02/2016  HCT 38.0 04/02/2016   MCV 88.1 04/02/2016   PLT 238.0 04/02/2016   Lab Results  Component Value Date   NA 141 04/02/2016   K 4.1 04/02/2016   CO2 29 04/02/2016   GLUCOSE 107 (H) 04/02/2016   BUN 18 04/02/2016   CREATININE 0.70 04/02/2016   BILITOT 0.4 04/02/2016   ALKPHOS 59 04/02/2016   AST 15 04/02/2016   ALT 13 04/02/2016   PROT 6.9 04/02/2016   ALBUMIN 4.1 04/02/2016   CALCIUM 9.4 04/02/2016   ANIONGAP 10 10/17/2014   GFR 87.41 04/02/2016   Lab Results  Component Value Date   CHOL 163 04/02/2016   Lab Results  Component Value Date   HDL 26.30 (L) 04/02/2016   Lab Results  Component Value Date   LDLCALC 95 11/14/2014   Lab Results  Component Value Date   TRIG 238.0 (H) 04/02/2016   Lab Results  Component Value Date   CHOLHDL 6 04/02/2016   Lab Results  Component Value Date   HGBA1C 6.1 09/23/2015       Assessment & Plan:   Problem List Items Addressed This Visit    Essential hypertension    Well controlled, no changes to meds. Encouraged heart healthy diet such as the DASH diet and exercise as tolerated.       Constipation    Encouraged increased hydration and fiber in diet. Daily probiotics. If bowels not moving can use MOM 2 tbls po in 4 oz of warm prune juice by mouth every 2-3 days. If no results then repeat in 4 hours with  Dulcolax suppository pr, may repeat again in 4 more hours as needed. Seek care if symptoms worsen. Consider daily Miralax and/or Dulcolax if symptoms persist.       Globus sensation    Given rx for Ranitidine and  discussed liklihood of silent heartburn referred to GI for consideration      Relevant Orders   Ambulatory referral to Gastroenterology   Ambulatory referral to ENT   CT Maxillofacial WO CM   Epistaxis - Primary    interrrmittent with right sided headache, chronic nasal congestion and history of facial trauma over right maxillary sinus. Referred to ENT for evaluation of theses symptoms and sense of globus. Also proceed with CT sinuses      Relevant Orders   Ambulatory referral to ENT   CT Maxillofacial WO CM   Arthralgia    Most notable in knees, is staying active, condinue glucosamine, consider curcumen. Report if symptoms worsen       Other Visit Diagnoses    Acute pharyngitis, unspecified etiology       Relevant Orders   Ambulatory referral to Gastroenterology   Ambulatory referral to ENT   Nasal congestion       Relevant Orders   Ambulatory referral to ENT   CT Maxillofacial WO CM   Headache around the eyes       Relevant Orders   Ambulatory referral to ENT   CT Maxillofacial WO CM      I have discontinued Ms. Mehan's Vitamin D3, mupirocin ointment, and glucosamine-chondroitin. I am also having her start on ranitidine. Additionally, I am having her maintain her b complex vitamins, aspirin, multivitamin, metoprolol tartrate, RA KRILL OIL, Glucosamine HCl (GLUCOSAMINE PO), zoster vaccine live (PF), lisinopril, calcium carbonate, and hydrochlorothiazide.  Meds ordered this encounter  Medications  . ranitidine (ZANTAC) 300 MG tablet    Sig: Take 1 tablet (300 mg total) by mouth at bedtime.  Dispense:  30 tablet    Refill:  2     Danise EdgeBLYTH, STACEY, MD

## 2016-07-27 NOTE — Assessment & Plan Note (Signed)
Well controlled, no changes to meds. Encouraged heart healthy diet such as the DASH diet and exercise as tolerated.  °

## 2016-07-27 NOTE — Assessment & Plan Note (Signed)
Encouraged increased hydration and fiber in diet. Daily probiotics. If bowels not moving can use MOM 2 tbls po in 4 oz of warm prune juice by mouth every 2-3 days. If no results then repeat in 4 hours with  Dulcolax suppository pr, may repeat again in 4 more hours as needed. Seek care if symptoms worsen. Consider daily Miralax and/or Dulcolax if symptoms persist.  

## 2016-07-28 ENCOUNTER — Ambulatory Visit (HOSPITAL_BASED_OUTPATIENT_CLINIC_OR_DEPARTMENT_OTHER)
Admission: RE | Admit: 2016-07-28 | Discharge: 2016-07-28 | Disposition: A | Discharge status: Home / Self Care | Payer: Medicare Other | Source: Ambulatory Visit | Attending: Family Medicine | Admitting: Family Medicine

## 2016-07-28 DIAGNOSIS — R0989 Other specified symptoms and signs involving the circulatory and respiratory systems: Secondary | ICD-10-CM

## 2016-07-28 DIAGNOSIS — R51 Headache: Secondary | ICD-10-CM | POA: Insufficient documentation

## 2016-07-28 DIAGNOSIS — R519 Headache, unspecified: Secondary | ICD-10-CM

## 2016-07-28 DIAGNOSIS — R0981 Nasal congestion: Secondary | ICD-10-CM | POA: Diagnosis not present

## 2016-07-28 DIAGNOSIS — F458 Other somatoform disorders: Secondary | ICD-10-CM | POA: Diagnosis not present

## 2016-07-28 DIAGNOSIS — R040 Epistaxis: Secondary | ICD-10-CM | POA: Insufficient documentation

## 2016-08-17 ENCOUNTER — Telehealth: Payer: Self-pay | Admitting: Family Medicine

## 2016-08-17 NOTE — Telephone Encounter (Signed)
04/14/15 PR PPPS, SUBSEQ VISIT [G0439] patient schedule AWV with [name] for 09/02/16 at 3pm

## 2016-08-31 NOTE — Progress Notes (Signed)
Subjective:   Eileen Hansen is a 71 y.o. female who presents for Medicare Annual (Subsequent) preventive examination.  Review of Systems:  No ROS.  Medicare Wellness Visit.  Cardiac Risk Factors include: advanced age (>69men, >66 women);hypertension;dyslipidemia Sleep patterns: Sleeps 8-10 hrs per night. Feels rested.  Home Safety/Smoke Alarms: Feels safe in home. Smoke, carbon monoxide, and security alarms in place.  Living environment; residence and Firearm Safety: Lives at home with husband. 1 story home. Guns safely stored.  Seat Belt Safety/Bike Helmet: Wears seat belt.   Counseling:   Eye Exam- Driving glasses. Dr.Miller annually. Dental- Dr.Mottinger every 6 months.  Female:   Pap- Pt will discuss with PCP at next visit.     Mammo- Last 03/15/16: no result on file. Normal per pt. Dexa scan- Last 04/05/16: No result on file.  CCS- Last 05/28/13: Normal. No f/u due to age per report.      Objective:     Vitals: BP 130/78 (BP Location: Left Arm, Patient Position: Sitting, Cuff Size: Normal)   Pulse 64   Ht 5' (1.524 m)   Wt 127 lb 12.8 oz (58 kg)   SpO2 96%   BMI 24.96 kg/m   Body mass index is 24.96 kg/m.   Tobacco History  Smoking Status  . Never Smoker  Smokeless Tobacco  . Never Used     Counseling given: No   Past Medical History:  Diagnosis Date  . Allergic rhinitis   . Arthralgia 09/30/2015  . Epistaxis 09/30/2015  . Globus sensation 02/23/2015  . HTN (hypertension)   . Left hip pain 09/30/2015  . Medicare annual wellness visit, subsequent 04/14/2015  . Tachycardia    Episodic   Past Surgical History:  Procedure Laterality Date  . APPENDECTOMY  71 yrs old  . BUNIONECTOMY    . WISDOM TOOTH EXTRACTION  71 yrs old   Family History  Problem Relation Age of Onset  . Cancer Father     melanoma  . Diabetes Mother   . Hypertension Mother   . Hyperlipidemia Mother   . Stroke Mother   . Heart attack Mother   . Heart attack Brother   . Cancer  Brother     pancreatic  . Colon cancer Neg Hx   . Breast cancer Neg Hx   . Coronary artery disease Neg Hx    History  Sexual Activity  . Sexual activity: Yes    Comment: lives with husband    Outpatient Encounter Prescriptions as of 09/02/2016  Medication Sig  . aspirin 81 MG tablet Take 81 mg by mouth daily.  . calcium carbonate (OSCAL) 1500 (600 Ca) MG TABS tablet Take by mouth 2 (two) times daily with a meal.  . hydrochlorothiazide (MICROZIDE) 12.5 MG capsule TAKE 1 CAPSULE (12.5 MG TOTAL) BY MOUTH DAILY.  Marland Kitchen lisinopril (PRINIVIL,ZESTRIL) 10 MG tablet TAKE 2 TABLETS BY MOUTH DAILY.  . metoprolol tartrate (LOPRESSOR) 25 MG tablet Take 0.5 tablets (12.5 mg total) by mouth 2 (two) times daily.  . Multiple Vitamin (MULTIVITAMIN) tablet Take 1 tablet by mouth daily. Nature's Brand  . RA KRILL OIL 500 MG CAPS Take 1 capsule by mouth daily. Reported on 09/30/2015  . b complex vitamins tablet Take 1 tablet by mouth daily.    . Glucosamine HCl (GLUCOSAMINE PO) Take 2 tablets by mouth.   . ranitidine (ZANTAC) 300 MG tablet Take 1 tablet (300 mg total) by mouth at bedtime. (Patient not taking: Reported on 09/02/2016)  . zoster vaccine  live, PF, (ZOSTAVAX) [id number] UNT/0.65ML injection Inject 19,400 Units into the skin once.   No facility-administered encounter medications on file as of 09/02/2016.     Activities of Daily Living In your present state of health, do you have any difficulty performing the following activities: 09/02/2016 04/08/2016  Hearing? N N  Vision? N N  Difficulty concentrating or making decisions? N N  Walking or climbing stairs? N N  Dressing or bathing? N N  Doing errands, shopping? N N  Preparing Food and eating ? N -  Using the Toilet? N -  In the past six months, have you accidently leaked urine? N -  Do you have problems with loss of bowel control? N -  Managing your Medications? N -  Managing your Finances? N -  Housekeeping or managing your Housekeeping? N -    Some recent data might be hidden    Patient Care Team: Bradd CanaryStacey A Blyth, MD as PCP - General (Family Medicine) Carrington ClampMichelle Horvath, MD as Consulting Physician (Obstetrics and Gynecology) Rachael Feeaniel P Jacobs, MD as Attending Physician (Gastroenterology) Jimmye NormanSteven Miller, OD (Optometry)    Assessment:    Physical assessment deferred to PCP.  Exercise Activities and Dietary recommendations Current Exercise Habits: Structured exercise class, Type of exercise: yoga, Time (Minutes): 45, Frequency (Times/Week): 3, Weekly Exercise (Minutes/Week): 135, Intensity: Mild   Diet (meal preparation, eat out, water intake, caffeinated beverages, dairy products, fruits and vegetables): in general, a "healthy" diet  , well balanced Breakfast: oatmeal, fruit, milk, coffee or green tea Lunch: salad w/ chicken or fish Dinner: small meat and vegetable. Drinks 2-3 bottles per day.  Goals    . Would like to travel to [place].      Fall Risk Fall Risk  09/02/2016 04/14/2015 04/05/2014  Falls in the past year? No Yes No  Number falls in past yr: - 1 -  Injury with Fall? - Yes -   Depression Screen PHQ 2/9 Scores 09/02/2016 04/14/2015 04/05/2014  PHQ - 2 Score 0 0 0     Cognitive Function MMSE - Mini Mental State Exam 09/02/2016  Orientation to time 5  Orientation to Place 5  Registration 3  Attention/ Calculation 5  Recall 3  Language- name 2 objects 2  Language- repeat 1  Language- follow 3 step command 3  Language- read & follow direction 1  Write a sentence 1  Copy design 1  Total score 30        Immunization History  Administered Date(s) Administered  . Influenza Split 05/11/2012  . Influenza Whole 05/31/2007, 05/14/2008, 05/29/2010  . Influenza, High Dose Seasonal PF 04/08/2016  . Influenza,inj,Quad PF,36+ Mos 04/19/2013, 04/05/2014, 04/14/2015  . Pneumococcal Conjugate-13 02/26/2014  . Pneumococcal Polysaccharide-23 03/29/2011  . Tdap 03/29/2011   Screening Tests Health Maintenance  Topic  Date Due  . ZOSTAVAX  03/07/2004  . MAMMOGRAM  03/15/2018  . TETANUS/TDAP  03/28/2021  . INFLUENZA VACCINE  Completed  . DEXA SCAN  Completed  . Hepatitis C Screening  Completed  . PNA vac Low Risk Adult  Completed      Plan:     Follow up with Dr.Blyth as scheduled 09/16/16.  Continue to eat heart healthy diet (full of fruits, vegetables, whole grains, lean protein, water--limit salt, fat, and sugar intake) and increase physical activity as tolerated.  Continue doing brain stimulating activities (puzzles, reading, adult coloring books, staying active) to keep memory sharp.   During the course of the visit the patient was educated and counseled  about the following appropriate screening and preventive services:   Vaccines to include Pneumoccal, Influenza, Hepatitis B, Td, Zostavax, HCV  Cardiovascular Disease  Colorectal cancer screening  Bone density screening  Diabetes screening  Glaucoma screening  Mammography/PAP  Nutrition counseling   Patient Instructions (the written plan) was given to the patient.   Avon Gully, [place]  09/02/2016

## 2016-09-02 ENCOUNTER — Ambulatory Visit (INDEPENDENT_AMBULATORY_CARE_PROVIDER_SITE_OTHER): Payer: Medicare Other | Admitting: *Deleted

## 2016-09-02 ENCOUNTER — Encounter: Payer: Self-pay | Admitting: *Deleted

## 2016-09-02 VITALS — BP 130/78 | HR 64 | Ht 60.0 in | Wt 127.8 lb

## 2016-09-02 DIAGNOSIS — Z0000 Encounter for general adult medical examination without abnormal findings: Secondary | ICD-10-CM | POA: Diagnosis not present

## 2016-09-02 NOTE — Progress Notes (Signed)
RN AWV note reviewed. Agree with documention and plan. 

## 2016-09-02 NOTE — Patient Instructions (Signed)
Follow up with Dr.Blyth as scheduled 09/16/16.  Continue to eat heart healthy diet (full of fruits, vegetables, whole grains, lean protein, water--limit salt, fat, and sugar intake) and increase physical activity as tolerated.  Continue doing brain stimulating activities (puzzles, reading, adult coloring books, staying active) to keep memory sharp.

## 2016-09-13 ENCOUNTER — Ambulatory Visit: Payer: Medicare Other | Admitting: Family Medicine

## 2016-09-16 ENCOUNTER — Ambulatory Visit: Payer: Medicare Other | Admitting: Family Medicine

## 2016-09-24 ENCOUNTER — Ambulatory Visit (INDEPENDENT_AMBULATORY_CARE_PROVIDER_SITE_OTHER): Payer: Medicare Other | Admitting: Gastroenterology

## 2016-09-24 ENCOUNTER — Encounter: Payer: Self-pay | Admitting: Gastroenterology

## 2016-09-24 VITALS — BP 138/74 | HR 68 | Ht 59.0 in | Wt 125.2 lb

## 2016-09-24 DIAGNOSIS — F458 Other somatoform disorders: Secondary | ICD-10-CM | POA: Diagnosis not present

## 2016-09-24 DIAGNOSIS — K5900 Constipation, unspecified: Secondary | ICD-10-CM

## 2016-09-24 DIAGNOSIS — R0989 Other specified symptoms and signs involving the circulatory and respiratory systems: Secondary | ICD-10-CM

## 2016-09-24 NOTE — Patient Instructions (Addendum)
You will be set up for an upper endoscopy for globus sensation. Please start taking citrucel (orange flavored) powder fiber supplement.  This may cause some bloating at first but that usually goes away. Begin with a small spoonful and work your way up to a large, heaping spoonful daily over a week.   Staying hydrated is also very important for constipation. Please try OTC omeprazole 20mg  pill, one pill 20-3330min before breakfast meal.

## 2016-09-24 NOTE — Progress Notes (Signed)
Review of pertinent gastrointestinal problems: 1. Routine risk for colon cancer: Colonoscopy Dr. Loreta AveMann 2006; no polyps or cancers; Colonoscopy Dr. Christella HartiganJacobs 05/2013 for heme + stool, diverticulosis, no polyps, no further CRC screening needed given age. 2. Rectal discomfort led to flex sig, Dr. Christella HartiganJacobs 2011; found hemorrhoids only.  Pelvic MRI was essentially normal at the time.   HPI: This is a  Very pleasant  who was referred to me by Bradd CanaryBlyth, Stacey A, MD  to evaluate  Globus.    Chief complaint is globus, but also constipation, belching   She has had globus-like sensation for at least 2 years.    ENT 2-3 months ago; told her that her symptoms were GERD related.  She does not agree with this.  In mornings she sticky stuff in her throat, clears her throat a lot.  Can be white and thick.  Sometimes green.  Swallowing often produces a lump like sensation.  She has nose bleeds right sided, at least monthly.  She never has pyrosis or acid taste in her mouth.  She has sensation of dysphagia about 3-4 times per week to liquids and solids  Has tried H2 blocker without change.  Has never tried PPI.  Belches a lot.  Can have to push and strain to move her bowels.  Sometimes 3 days without BM.  Never sees blood in her stools  Has rectal pressure, for at least 7 years.  Evaluated above. (MRI, flex sig, also later colonsocopy)   Review of systems: Pertinent positive and negative review of systems were noted in the above HPI section. Complete review of systems was performed and was otherwise normal.   Past Medical History:  Diagnosis Date  . Allergic rhinitis   . Arthralgia 09/30/2015  . Epistaxis 09/30/2015  . Globus sensation 02/23/2015  . HTN (hypertension)   . Left hip pain 09/30/2015  . Medicare annual wellness visit, subsequent 04/14/2015  . Tachycardia    Episodic    Past Surgical History:  Procedure Laterality Date  . APPENDECTOMY  71 yrs old  . BUNIONECTOMY    . WISDOM TOOTH  EXTRACTION  71 yrs old    Current Outpatient Prescriptions  Medication Sig Dispense Refill  . aspirin 81 MG tablet Take 81 mg by mouth daily.    . Cholecalciferol (VITAMIN D3) 2000 units TABS Take 1 tablet by mouth daily.    . Cyanocobalamin (VITAMIN B-12) 5000 MCG TBDP Take 1 tablet by mouth daily.    . hydrochlorothiazide (MICROZIDE) 12.5 MG capsule TAKE 1 CAPSULE (12.5 MG TOTAL) BY MOUTH DAILY. 90 capsule 1  . lisinopril (PRINIVIL,ZESTRIL) 10 MG tablet TAKE 2 TABLETS BY MOUTH DAILY. 180 tablet 1  . metoprolol tartrate (LOPRESSOR) 25 MG tablet Take 0.5 tablets (12.5 mg total) by mouth 2 (two) times daily. 90 tablet 1  . Multiple Minerals-Vitamins (CALCIUM CITRATE PLUS PO) Take 2 tablets by mouth daily.     No current facility-administered medications for this visit.     Allergies as of 09/24/2016 - Review Complete 09/24/2016  Allergen Reaction Noted  . Penicillins      Family History  Problem Relation Age of Onset  . Cancer Father     melanoma  . Diabetes Mother   . Hypertension Mother   . Hyperlipidemia Mother   . Stroke Mother   . Heart attack Mother   . Heart attack Brother   . Cancer Brother     pancreatic  . Colon cancer Neg Hx   . Breast cancer  Neg Hx   . Coronary artery disease Neg Hx     Social History   Social History  . Marital status: Married    Spouse name: N/A  . Number of children: 1  . Years of education: N/A   Occupational History  . Homemaker Retired   Social History Main Topics  . Smoking status: Never Smoker  . Smokeless tobacco: Never Used  . Alcohol use Yes     Comment: wine  . Drug use: No  . Sexual activity: Yes     Comment: lives with husband   Other Topics Concern  . Not on file   Social History Narrative   University - [place], Masters Degree - counseling, [employer]   Married '69 - 7 years, divorced; married '82   21 daughter - '72; 2 grandchildren      Regular Exercise -  YES           Physical  Exam: Ht 4\' 11"  (1.499 m)   Wt 125 lb 4 oz (56.8 kg)   BMI 25.30 kg/m  Constitutional: generally well-appearing Psychiatric: alert and oriented x3 Eyes: extraocular movements intact Mouth: oral pharynx moist, no lesions Neck: supple no lymphadenopathy Cardiovascular: heart regular rate and rhythm Lungs: clear to auscultation bilaterally Abdomen: soft, nontender, nondistended, no obvious ascites, no peritoneal signs, normal bowel sounds Extremities: no lower extremity edema bilaterally Skin: no lesions on visible extremities   Assessment and plan: 71 y.o. female with a globus-like sensation, dysphagia, constipation, belching, rectal discomfort  First, her rectal pains are at least 71 years old, have been evaluated with flex sig, MRI, colonoscopy, not changed, need no further evaluation.  Next, her constipation seems functional and I recommended she try fiber supplements, hydrate better. This may help her gassiness as well.  Lastly, her swallowing issues are somewhat globus like-somewhat dysphagia like.  ENT evaluation felt these were GERD related and that MAY be true. She's never tried PPI and I recommended she begin OTC PPI for now.  Also recommended EGD for direct visualization of UGI tract, checking for strictures, significant acid damage.  Please see the "Patient Instructions" section for addition details about the plan.   Rob Bunting, MD [facility] 09/24/2016, 4:06 PM  Cc: Bradd Canary, MD

## 2016-09-28 ENCOUNTER — Other Ambulatory Visit: Payer: Self-pay | Admitting: Family Medicine

## 2016-09-28 ENCOUNTER — Encounter: Payer: Self-pay | Admitting: Gastroenterology

## 2016-09-28 MED ORDER — METOPROLOL TARTRATE 25 MG PO TABS: 12.5000 mg | ORAL_TABLET | Freq: Two times a day (BID) | ORAL | 1 refills | Status: DC

## 2016-09-28 MED ORDER — LISINOPRIL 10 MG PO TABS: 20.0000 mg | ORAL_TABLET | Freq: Every day | ORAL | 1 refills | Status: DC

## 2016-10-11 ENCOUNTER — Telehealth: Payer: Self-pay | Admitting: Gastroenterology

## 2016-10-11 NOTE — Telephone Encounter (Signed)
Dr Jacobs FYI 

## 2016-10-11 NOTE — Telephone Encounter (Signed)
No charge. 

## 2016-10-12 ENCOUNTER — Encounter: Payer: Medicare Other | Admitting: Gastroenterology

## 2016-10-29 ENCOUNTER — Ambulatory Visit: Payer: Medicare Other | Admitting: Family Medicine

## 2016-12-06 ENCOUNTER — Ambulatory Visit (INDEPENDENT_AMBULATORY_CARE_PROVIDER_SITE_OTHER): Payer: Medicare Other | Admitting: Family Medicine

## 2016-12-06 VITALS — BP 124/68 | HR 68 | Temp 98.3°F | Resp 18 | Wt 128.0 lb

## 2016-12-06 DIAGNOSIS — E782 Mixed hyperlipidemia: Secondary | ICD-10-CM | POA: Diagnosis not present

## 2016-12-06 DIAGNOSIS — M25542 Pain in joints of left hand: Secondary | ICD-10-CM

## 2016-12-06 DIAGNOSIS — F458 Other somatoform disorders: Secondary | ICD-10-CM

## 2016-12-06 DIAGNOSIS — M25511 Pain in right shoulder: Secondary | ICD-10-CM

## 2016-12-06 DIAGNOSIS — R0989 Other specified symptoms and signs involving the circulatory and respiratory systems: Secondary | ICD-10-CM

## 2016-12-06 DIAGNOSIS — G5603 Carpal tunnel syndrome, bilateral upper limbs: Secondary | ICD-10-CM

## 2016-12-06 DIAGNOSIS — R000 Tachycardia, unspecified: Secondary | ICD-10-CM

## 2016-12-06 DIAGNOSIS — I10 Essential (primary) hypertension: Secondary | ICD-10-CM | POA: Diagnosis not present

## 2016-12-06 DIAGNOSIS — M8580 Other specified disorders of bone density and structure, unspecified site: Secondary | ICD-10-CM | POA: Diagnosis not present

## 2016-12-06 DIAGNOSIS — M25519 Pain in unspecified shoulder: Secondary | ICD-10-CM

## 2016-12-06 DIAGNOSIS — M25541 Pain in joints of right hand: Secondary | ICD-10-CM

## 2016-12-06 DIAGNOSIS — R011 Cardiac murmur, unspecified: Secondary | ICD-10-CM

## 2016-12-06 DIAGNOSIS — R7303 Prediabetes: Secondary | ICD-10-CM

## 2016-12-06 DIAGNOSIS — R002 Palpitations: Secondary | ICD-10-CM

## 2016-12-06 DIAGNOSIS — R09A2 Foreign body sensation, throat: Secondary | ICD-10-CM

## 2016-12-06 DIAGNOSIS — M25539 Pain in unspecified wrist: Secondary | ICD-10-CM

## 2016-12-06 DIAGNOSIS — G8929 Other chronic pain: Secondary | ICD-10-CM

## 2016-12-06 MED ORDER — RANITIDINE HCL 300 MG PO TABS: 300.0000 mg | ORAL_TABLET | Freq: Every evening | ORAL | 5 refills | Status: DC | PRN

## 2016-12-06 MED ORDER — ESOMEPRAZOLE MAGNESIUM 40 MG PO CPDR: 40.0000 mg | DELAYED_RELEASE_CAPSULE | ORAL | 5 refills | Status: DC

## 2016-12-06 MED ORDER — CETIRIZINE HCL 10 MG PO TABS: 10.0000 mg | ORAL_TABLET | Freq: Two times a day (BID) | ORAL | 5 refills | Status: DC

## 2016-12-06 NOTE — Patient Instructions (Addendum)
NExium/Esomeprazole in am, Ranitidine/Zantac at bed  Zyrtec/Cetirizine twice daily all for 2 months if improved stop one at a time and note if symptoms return, restart last medicine stopped   Carpal Tunnel Syndrome Carpal tunnel syndrome is a condition that causes pain in your hand and arm. The carpal tunnel is a narrow area that is on the palm side of your wrist. Repeated wrist motion or certain diseases may cause swelling in the tunnel. This swelling can pinch the main nerve in the wrist (median nerve). Follow these instructions at home: If you have a splint:   Wear it as told by your doctor. Remove it only as told by your doctor.  Loosen the splint if your fingers:  Become numb and tingle.  Turn blue and cold.  Keep the splint clean and dry. General instructions   Take over-the-counter and prescription medicines only as told by your doctor.  Rest your wrist from any activity that may be causing your pain. If needed, talk to your employer about changes that can be made in your work, such as getting a wrist pad to use while typing.  If directed, apply ice to the painful area:  Put ice in a plastic bag.  Place a towel between your skin and the bag.  Leave the ice on for 20 minutes, 2-3 times per day.  Keep all follow-up visits as told by your doctor. This is important.  Do any exercises as told by your doctor, physical therapist, or occupational therapist. Contact a doctor if:  You have new symptoms.  Medicine does not help your pain.  Your symptoms get worse. This information is not intended to replace advice given to you by your health care provider. Make sure you discuss any questions you have with your health care provider. Document Released: 06/24/2011 Document Revised: 12/11/2015 Document Reviewed: 11/20/2014 Elsevier Interactive Patient Education  2017 [employer]

## 2016-12-06 NOTE — Progress Notes (Signed)
Subjective:  I acted as a Neurosurgeonscribe for Dr. Abner GreenspanBlyth. Eileen Hansen, [place]  Patient ID: Eileen Hansen, female    DOB: [birth year], 71 y.o.   MRN: [id number]  Chief Complaint  Patient presents with  . Follow-up  . Hypertension  . Hyperlipidemia    HPI  Patient is in today for a follow up. She continues to struggle with chronic pain and notes the majority of her pain is in her shoulders and wrists. Worse with use. No recent trauma or falls. She also endorses some ongoing sense of globus or thick mucus in her throat. Tried a very short course of acid suppressants and had no improvement. No fevers or chills. No sign of acute illness or recent febrile concern. Denies CP/palp/SOB/HA/congestion/fevers/GI or GU c/o. Taking meds as prescribed  Patient Care Team: Bradd CanaryBlyth, Charell Faulk A, MD as PCP - General (Family Medicine) Carrington ClampHorvath, Michelle, MD as Consulting Physician (Obstetrics and Gynecology) Rachael FeeJacobs, Daniel P, MD as Attending Physician (Gastroenterology) Jimmye NormanMiller, Steven, OD (Optometry)   Past Medical History:  Diagnosis Date  . Allergic rhinitis   . Arthralgia 09/30/2015  . Chronic shoulder pain 12/07/2016  . Epistaxis 09/30/2015  . Globus sensation 02/23/2015  . Heart murmur 12/07/2016  . HTN (hypertension)   . Left hip pain 09/30/2015  . Medicare annual wellness visit, subsequent 04/14/2015  . Pain in the wrist, unspecified laterality 03/03/2014  . Tachycardia    Episodic    Past Surgical History:  Procedure Laterality Date  . APPENDECTOMY  71 yrs old  . BUNIONECTOMY    . WISDOM TOOTH EXTRACTION  71 yrs old    Family History  Problem Relation Age of Onset  . Cancer Father        melanoma  . Diabetes Mother   . Hypertension Mother   . Hyperlipidemia Mother   . Stroke Mother   . Heart attack Mother   . Heart attack Brother   . Cancer Brother        pancreatic  . Colon cancer Neg Hx   . Breast cancer Neg Hx   . Coronary artery disease Neg Hx     Social History   Social History  . Marital  status: Married    Spouse name: N/A  . Number of children: 1  . Years of education: N/A   Occupational History  . Homemaker Retired   Social History Main Topics  . Smoking status: Never Smoker  . Smokeless tobacco: Never Used  . Alcohol use Yes     Comment: wine  . Drug use: No  . Sexual activity: Yes     Comment: lives with husband   Other Topics Concern  . Not on file   Social History Narrative   University - [place], Masters Degree - counseling, [employer]   Married '69 - 7 years, divorced; married '82   21 daughter - '72; 2 grandchildren      Regular Exercise -  YES          Outpatient Medications Prior to Visit  Medication Sig Dispense Refill  . aspirin 81 MG tablet Take 81 mg by mouth daily.    . Cholecalciferol (VITAMIN D3) 2000 units TABS Take 1 tablet by mouth daily.    . Cyanocobalamin (VITAMIN B-12) 5000 MCG TBDP Take 1 tablet by mouth daily.    . hydrochlorothiazide (MICROZIDE) 12.5 MG capsule TAKE 1 CAPSULE (12.5 MG TOTAL) BY MOUTH DAILY. 90 capsule 1  . lisinopril (PRINIVIL,ZESTRIL) 10 MG tablet Take 2 tablets (  20 mg total) by mouth daily. 180 tablet 1  . metoprolol tartrate (LOPRESSOR) 25 MG tablet Take 0.5 tablets (12.5 mg total) by mouth 2 (two) times daily. 90 tablet 1  . Multiple Minerals-Vitamins (CALCIUM CITRATE PLUS PO) Take 2 tablets by mouth daily.     No facility-administered medications prior to visit.     Allergies  Allergen Reactions  . Penicillins     REACTION: Hives Anaphylaxis    Review of Systems  Constitutional: Negative for fever and malaise/fatigue.  HENT: Positive for congestion.   Eyes: Negative for blurred vision.  Respiratory: Positive for sputum production. Negative for shortness of breath.   Cardiovascular: Negative for chest pain, palpitations and leg swelling.  Gastrointestinal: Negative for abdominal pain, blood in stool and nausea.  Genitourinary: Negative for dysuria and frequency.  Musculoskeletal:  Positive for joint pain. Negative for falls.  Skin: Negative for rash.  Neurological: Negative for dizziness, loss of consciousness and headaches.  Endo/Heme/Allergies: Negative for environmental allergies.  Psychiatric/Behavioral: Negative for depression. The patient is not nervous/anxious.        Objective:    Physical Exam  Constitutional: She is oriented to person, place, and time. She appears well-developed and well-nourished. No distress.  HENT:  Head: Normocephalic and atraumatic.  Nose: Nose normal.  Eyes: Right eye exhibits no discharge. Left eye exhibits no discharge.  Neck: Normal range of motion. Neck supple.  Cardiovascular: Normal rate and regular rhythm.   No murmur heard. Pulmonary/Chest: Effort normal and breath sounds normal.  Abdominal: Soft. Bowel sounds are normal. There is no tenderness.  Musculoskeletal: She exhibits no edema.  Neurological: She is alert and oriented to person, place, and time.  Skin: Skin is warm and dry.  Psychiatric: She has a normal mood and affect.  Nursing note and vitals reviewed.   BP 124/68 (BP Location: Left Arm, Patient Position: Sitting, Cuff Size: Normal)   Pulse 68   Temp 98.3 F (36.8 C) (Oral)   Resp 18   Wt 128 lb (58.1 kg)   SpO2 96%   BMI 25.85 kg/m  Wt Readings from Last 3 Encounters:  12/06/16 128 lb (58.1 kg)  09/24/16 125 lb 4 oz (56.8 kg)  09/02/16 127 lb 12.8 oz (58 kg)   BP Readings from Last 3 Encounters:  12/06/16 124/68  09/24/16 138/74  09/02/16 130/78     Immunization History  Administered Date(s) Administered  . Influenza Split 05/11/2012  . Influenza Whole 05/31/2007, 05/14/2008, 05/29/2010  . Influenza, High Dose Seasonal PF 04/08/2016  . Influenza,inj,Quad PF,36+ Mos 04/19/2013, 04/05/2014, 04/14/2015  . Pneumococcal Conjugate-13 02/26/2014  . Pneumococcal Polysaccharide-23 03/29/2011  . Tdap 03/29/2011    Health Maintenance  Topic Date Due  . INFLUENZA VACCINE  02/16/2017  .  MAMMOGRAM  03/15/2018  . TETANUS/TDAP  03/28/2021  . DEXA SCAN  Completed  . Hepatitis C Screening  Completed  . PNA vac Low Risk Adult  Completed    Lab Results  Component Value Date   WBC 7.1 04/02/2016   HGB 13.4 04/02/2016   HCT 38.0 04/02/2016   PLT 238.0 04/02/2016   GLUCOSE 107 (H) 04/02/2016   CHOL 163 04/02/2016   TRIG 238.0 (H) 04/02/2016   HDL 26.30 (L) 04/02/2016   LDLDIRECT 101.0 04/02/2016   LDLCALC 95 11/14/2014   ALT 13 04/02/2016   AST 15 04/02/2016   NA 141 04/02/2016   K 4.1 04/02/2016   CL 106 04/02/2016   CREATININE 0.70 04/02/2016   BUN 18 04/02/2016  CO2 29 04/02/2016   TSH 2.06 04/02/2016   HGBA1C 6.1 09/23/2015   MICROALBUR <0.7 04/02/2016    Lab Results  Component Value Date   TSH 2.06 04/02/2016   Lab Results  Component Value Date   WBC 7.1 04/02/2016   HGB 13.4 04/02/2016   HCT 38.0 04/02/2016   MCV 88.1 04/02/2016   PLT 238.0 04/02/2016   Lab Results  Component Value Date   NA 141 04/02/2016   K 4.1 04/02/2016   CO2 29 04/02/2016   GLUCOSE 107 (H) 04/02/2016   BUN 18 04/02/2016   CREATININE 0.70 04/02/2016   BILITOT 0.4 04/02/2016   ALKPHOS 59 04/02/2016   AST 15 04/02/2016   ALT 13 04/02/2016   PROT 6.9 04/02/2016   ALBUMIN 4.1 04/02/2016   CALCIUM 9.4 04/02/2016   ANIONGAP 10 10/17/2014   GFR 87.41 04/02/2016   Lab Results  Component Value Date   CHOL 163 04/02/2016   Lab Results  Component Value Date   HDL 26.30 (L) 04/02/2016   Lab Results  Component Value Date   LDLCALC 95 11/14/2014   Lab Results  Component Value Date   TRIG 238.0 (H) 04/02/2016   Lab Results  Component Value Date   CHOLHDL 6 04/02/2016   Lab Results  Component Value Date   HGBA1C 6.1 09/23/2015         Assessment & Plan:   Problem List Items Addressed This Visit    Essential hypertension - Primary    Well controlled, no changes to meds. Encouraged heart healthy diet such as the DASH diet and exercise as tolerated.         Relevant Orders   CBC   Comprehensive metabolic panel   TSH   Tachycardia    RRR today, notes some numbers down to 50s and she feels tired other numbers up as high as 110s or 120s      Hyperlipidemia, mixed    Tolerating statin, encouraged heart healthy diet, avoid trans fats, minimize simple carbs and saturated fats. Increase exercise as tolerated      Relevant Orders   Lipid panel   JOINT PAIN, HAND    B/l wrist pain, ice, lidocaine splints referred to sports med      Pain in the wrist, unspecified laterality    B/l and worse with use, encouraged ice, lidocaine patches and splinting qhs. If no improvement. Will need referral      Borderline diabetes    hgba1c acceptable, minimize simple carbs. Increase exercise as tolerated.       Relevant Orders   Hemoglobin A1c   Osteopenia    Encouraged to get adequate exercise, calcium and vitamin d intake      Globus sensation    Persistent despite 1 month trial of acid suppression. Did not proceed with upper endoscopy as advised. Was worried about potential risks. Discussed the need for full visualization before we can fully know her cause. For now she agrees to start acid suppression twice a day with a PPI in the morning and H2 blocker in the evening as well as antihistamine, Zyrtec twice a day for the next 2 months. If she gets relief will stop one medicine at a time and see when symptoms recur. Report worsening symptoms or acute concerns.      Heart murmur    With some occasional palpitations. Will proceed with echo.      Chronic shoulder pain    R>L Encouraged to stay active. Moist heat,  stretching, topical treatments and referral to sports med       Other Visit Diagnoses    Bilateral carpal tunnel syndrome       Relevant Orders   Ambulatory referral to Sports Medicine   Right shoulder pain, unspecified chronicity       Relevant Orders   Ambulatory referral to Sports Medicine   Palpitations       Relevant Orders    ECHOCARDIOGRAM COMPLETE   Murmur, heart       Relevant Orders   ECHOCARDIOGRAM COMPLETE      I am having Ms. Coppens start on esomeprazole, ranitidine, and cetirizine. I am also having her maintain her aspirin, hydrochlorothiazide, Multiple Minerals-Vitamins (CALCIUM CITRATE PLUS PO), Vitamin D3, Vitamin B-12, metoprolol tartrate, and lisinopril.  Meds ordered this encounter  Medications  . esomeprazole (NEXIUM) 40 MG capsule    Sig: Take 1 capsule (40 mg total) by mouth every morning.    Dispense:  30 capsule    Refill:  5  . ranitidine (ZANTAC) 300 MG tablet    Sig: Take 1 tablet (300 mg total) by mouth at bedtime as needed for heartburn.    Dispense:  30 tablet    Refill:  5  . cetirizine (ZYRTEC) 10 MG tablet    Sig: Take 1 tablet (10 mg total) by mouth 2 (two) times daily.    Dispense:  60 tablet    Refill:  5    CMA served as scribe during this visit. History, Physical and Plan performed by medical provider. Documentation and orders reviewed and attested to.  Danise Edge, MD

## 2016-12-06 NOTE — Assessment & Plan Note (Signed)
hgba1c acceptable, minimize simple carbs. Increase exercise as tolerated.  

## 2016-12-06 NOTE — Assessment & Plan Note (Signed)
RRR today, notes some numbers down to 50s and she feels tired other numbers up as high as 110s or 120s

## 2016-12-06 NOTE — Assessment & Plan Note (Signed)
B/l wrist pain, ice, lidocaine splints referred to sports med

## 2016-12-06 NOTE — Assessment & Plan Note (Addendum)
Well controlled, no changes to meds. Encouraged heart healthy diet such as the DASH diet and exercise as tolerated.  °

## 2016-12-06 NOTE — Assessment & Plan Note (Signed)
Encouraged to get adequate exercise, calcium and vitamin d intake 

## 2016-12-06 NOTE — Assessment & Plan Note (Signed)
Tolerating statin, encouraged heart healthy diet, avoid trans fats, minimize simple carbs and saturated fats. Increase exercise as tolerated 

## 2016-12-07 ENCOUNTER — Encounter: Payer: Self-pay | Admitting: Family Medicine

## 2016-12-07 DIAGNOSIS — M25519 Pain in unspecified shoulder: Secondary | ICD-10-CM

## 2016-12-07 DIAGNOSIS — R011 Cardiac murmur, unspecified: Secondary | ICD-10-CM | POA: Insufficient documentation

## 2016-12-07 DIAGNOSIS — G8929 Other chronic pain: Secondary | ICD-10-CM

## 2016-12-07 HISTORY — DX: Cardiac murmur, unspecified: R01.1

## 2016-12-07 HISTORY — DX: Other chronic pain: G89.29

## 2016-12-07 LAB — LIPID PANEL
Cholesterol: 198 mg/dL (ref 0–200)
HDL: 24.5 mg/dL — ABNORMAL LOW (ref >39.00)
Total CHOL/HDL Ratio: 8
Triglycerides: 513 mg/dL — ABNORMAL HIGH (ref 0.0–149.0)

## 2016-12-07 LAB — COMPREHENSIVE METABOLIC PANEL
ALBUMIN: 4.5 g/dL (ref 3.5–5.2)
ALT: 17 U/L (ref 0–35)
AST: 18 U/L (ref 0–37)
Alkaline Phosphatase: 64 U/L (ref 39–117)
BUN: 21 mg/dL (ref 6–23)
CHLORIDE: 102 meq/L (ref 96–112)
CO2: 30 mEq/L (ref 19–32)
CREATININE: 0.69 mg/dL (ref 0.40–1.20)
Calcium: 9.9 mg/dL (ref 8.4–10.5)
GFR: 88.7 mL/min (ref >60.00)
GLUCOSE: 115 mg/dL — AB (ref 70–99)
Potassium: 4 mEq/L (ref 3.5–5.1)
SODIUM: 139 meq/L (ref 135–145)
Total Bilirubin: 0.3 mg/dL (ref 0.2–1.2)
Total Protein: 7.3 g/dL (ref 6.0–8.3)

## 2016-12-07 LAB — LDL CHOLESTEROL, DIRECT: LDL DIRECT: 118 mg/dL

## 2016-12-07 LAB — CBC
HCT: 40.9 % (ref 36.0–46.0)
Hemoglobin: 14.1 g/dL (ref 12.0–15.0)
MCHC: 34.5 g/dL (ref 30.0–36.0)
MCV: 89.9 fl (ref 78.0–100.0)
Platelets: 295 10*3/uL (ref 150.0–400.0)
RBC: 4.55 Mil/uL (ref 3.87–5.11)
RDW: 12.5 % (ref 11.5–15.5)
WBC: 8.7 10*3/uL (ref 4.0–10.5)

## 2016-12-07 LAB — TSH: TSH: 1.72 u[IU]/mL (ref 0.35–4.50)

## 2016-12-07 LAB — HEMOGLOBIN A1C: Hgb A1c MFr Bld: 6.2 % (ref 4.6–6.5)

## 2016-12-07 NOTE — Assessment & Plan Note (Signed)
Persistent despite 1 month trial of acid suppression. Did not proceed with upper endoscopy as advised. Was worried about potential risks. Discussed the need for full visualization before we can fully know her cause. For now she agrees to start acid suppression twice a day with a PPI in the morning and H2 blocker in the evening as well as antihistamine, Zyrtec twice a day for the next 2 months. If she gets relief will stop one medicine at a time and see when symptoms recur. Report worsening symptoms or acute concerns.

## 2016-12-07 NOTE — Assessment & Plan Note (Signed)
B/l and worse with use, encouraged ice, lidocaine patches and splinting qhs. If no improvement. Will need referral

## 2016-12-07 NOTE — Assessment & Plan Note (Signed)
With some occasional palpitations. Will proceed with echo.

## 2016-12-07 NOTE — Assessment & Plan Note (Signed)
R>L Encouraged to stay active. Moist heat, stretching, topical treatments and referral to sports med

## 2016-12-08 ENCOUNTER — Ambulatory Visit: Payer: Medicare Other | Admitting: Family Medicine

## 2016-12-08 ENCOUNTER — Ambulatory Visit (INDEPENDENT_AMBULATORY_CARE_PROVIDER_SITE_OTHER): Payer: Medicare Other | Admitting: Family Medicine

## 2016-12-08 ENCOUNTER — Encounter: Payer: Self-pay | Admitting: Family Medicine

## 2016-12-08 DIAGNOSIS — M25512 Pain in left shoulder: Secondary | ICD-10-CM

## 2016-12-08 DIAGNOSIS — M25542 Pain in joints of left hand: Secondary | ICD-10-CM

## 2016-12-08 DIAGNOSIS — G8929 Other chronic pain: Secondary | ICD-10-CM | POA: Diagnosis not present

## 2016-12-08 DIAGNOSIS — M25511 Pain in right shoulder: Secondary | ICD-10-CM | POA: Diagnosis not present

## 2016-12-08 DIAGNOSIS — M25541 Pain in joints of right hand: Secondary | ICD-10-CM | POA: Diagnosis not present

## 2016-12-08 NOTE — Patient Instructions (Signed)
Your thumb pain is due to arthritis, potentially some left sided carpal tunnel also. These are the different medications you can take for this: Tylenol 500mg  1-2 tabs three times a day for pain. Ibuprofen as noted below. Capsaicin, aspercreme, or biofreeze topically up to four times a day may also help with pain. Some supplements that may help for arthritis: Boswellia extract, curcumin, pycnogenol Cortisone injections are an option. It's important that you continue to stay active. Consider physical therapy to strengthen muscles around the joint that hurts to take pressure off of the joint itself. Heat or ice 15 minutes at a time 3-4 times a day as needed to help with pain.  Your shoulder pain is due to rotator cuff impingement Try to avoid painful activities (overhead activities, lifting with extended arm) as much as possible. Take ibuprofen as you have been - up to three times a day with food - we could consider a different antiinflammatory if necessary. Can take tylenol in addition to this. Subacromial injection may be beneficial to help with pain and to decrease inflammation. Consider physical therapy with transition to home exercise program. Do home exercise program with theraband and scapular stabilization exercises daily - these are very important for long term relief even if an injection was given.  3 sets of 10  Once a day If not improving at follow-up we will consider further imaging, injection, physical therapy, and/or nitro patches. Follow up with me in 1 month to 6 weeks for both issues.

## 2016-12-09 ENCOUNTER — Other Ambulatory Visit: Payer: Self-pay | Admitting: Family Medicine

## 2016-12-09 MED ORDER — CHOLINE FENOFIBRATE 135 MG PO CPDR: 135.0000 mg | DELAYED_RELEASE_CAPSULE | Freq: Every day | ORAL | 3 refills | Status: DC

## 2016-12-14 NOTE — Assessment & Plan Note (Signed)
primarily due to [facility] though has some tenderness at carpal tunnel on left only.  Discussed tylenol, topical medications, supplements that may help.  Thumb spica braces.  Ibuprofen as needed.  Consider physical therapy, injections if not improving.

## 2016-12-14 NOTE — Progress Notes (Signed)
PCP and consultation requested by: Bradd Canary, MD  Subjective:   HPI: Patient is a 71 y.o. female here for bilateral wrist, shoulder pain.  Patient reports she's had problems for 3 years with both wrists. Trouble getting into certain yoga positions. Pain gets up to 7/10 and sharp especially when putting pressure on her wrists. Pain felt more on radial side at base of thumbs. Difficulty picking up more than 3 pounds. Also with 1 year of bilateral shoulder pain. Right side worse than left - up to 7/10 on right, 5/10 on left. Motion feels limited. Takes ibuprofen but stomach sensitive to this. Pain radiates to upper arm. No skin changes, numbness of shoulders or wrists.  Past Medical History:  Diagnosis Date  . Allergic rhinitis   . Arthralgia 09/30/2015  . Chronic shoulder pain 12/07/2016  . Epistaxis 09/30/2015  . Globus sensation 02/23/2015  . Heart murmur 12/07/2016  . HTN (hypertension)   . Left hip pain 09/30/2015  . Medicare annual wellness visit, subsequent 04/14/2015  . Pain in the wrist, unspecified laterality 03/03/2014  . Tachycardia    Episodic    Current Outpatient Prescriptions on File Prior to Visit  Medication Sig Dispense Refill  . aspirin 81 MG tablet Take 81 mg by mouth daily.    . cetirizine (ZYRTEC) 10 MG tablet Take 1 tablet (10 mg total) by mouth 2 (two) times daily. 60 tablet 5  . Cholecalciferol (VITAMIN D3) 2000 units TABS Take 1 tablet by mouth daily.    . Cyanocobalamin (VITAMIN B-12) 5000 MCG TBDP Take 1 tablet by mouth daily.    Marland Kitchen esomeprazole (NEXIUM) 40 MG capsule Take 1 capsule (40 mg total) by mouth every morning. 30 capsule 5  . hydrochlorothiazide (MICROZIDE) 12.5 MG capsule TAKE 1 CAPSULE (12.5 MG TOTAL) BY MOUTH DAILY. 90 capsule 1  . lisinopril (PRINIVIL,ZESTRIL) 10 MG tablet Take 2 tablets (20 mg total) by mouth daily. 180 tablet 1  . metoprolol tartrate (LOPRESSOR) 25 MG tablet Take 0.5 tablets (12.5 mg total) by mouth 2 (two) times  daily. 90 tablet 1  . Multiple Minerals-Vitamins (CALCIUM CITRATE PLUS PO) Take 2 tablets by mouth daily.    . ranitidine (ZANTAC) 300 MG tablet Take 1 tablet (300 mg total) by mouth at bedtime as needed for heartburn. 30 tablet 5   No current facility-administered medications on file prior to visit.     Past Surgical History:  Procedure Laterality Date  . APPENDECTOMY  71 yrs old  . BUNIONECTOMY    . WISDOM TOOTH EXTRACTION  71 yrs old    Allergies  Allergen Reactions  . Penicillins     REACTION: Hives Anaphylaxis    Social History   Social History  . Marital status: Married    Spouse name: N/A  . Number of children: 1  . Years of education: N/A   Occupational History  . Homemaker Retired   Social History Main Topics  . Smoking status: Never Smoker  . Smokeless tobacco: Never Used  . Alcohol use Yes     Comment: wine  . Drug use: No  . Sexual activity: Yes     Comment: lives with husband   Other Topics Concern  . Not on file   Social History Narrative   University - [place], Masters Degree - counseling, [employer]   Married '69 - 7 years, divorced; married '82   21 daughter - '72; 2 grandchildren      Regular Exercise -  YES  Family History  Problem Relation Age of Onset  . Cancer Father        melanoma  . Diabetes Mother   . Hypertension Mother   . Hyperlipidemia Mother   . Stroke Mother   . Heart attack Mother   . Heart attack Brother   . Cancer Brother        pancreatic  . Colon cancer Neg Hx   . Breast cancer Neg Hx   . Coronary artery disease Neg Hx     BP 139/78   Pulse (!) 57   Ht 4\' 11"  (1.499 m)   Wt 124 lb (56.2 kg)   BMI 25.04 kg/m   Review of Systems: See HPI above.     Objective:  Physical Exam:  Gen: NAD, comfortable in exam room  Bilateral shoulders: No swelling, ecchymoses.  No gross deformity. No TTP. FROM with painful arc. Positive Hawkins, Neers. Negative Yergasons. Strength 5/5 with  empty can and resisted internal/external rotation.  Pain with empty can. Negative apprehension. NV intact distally.  Left wrist: No gross deformity, swelling, bruising. TTP [facility]st [facility].  Also tender over carpal tunnel.  No other tenderness wrist or hand. FROM digits, wrists with pain on thumb motions. Negative phalens, tinels (pain only at carpal tunnel but not into digits), finkelsteins. NVI distally. Sensation intact to light touch.  Right wrist: No gross deformity, swelling, bruising. TTP [facility]st [facility].  No other tenderness wrist or hand. FROM digits, wrists with pain on thumb motions. Negative phalens, tinels, finkelsteins. NVI distally. Sensation intact to light touch.   Assessment & Plan:  1. Bilateral shoulder pain - 2/2 rotator cuff impingement.  Shown home exercises to do daily with theraband.  Ibuprofen but advised to consider a different NSAID also.  Consider physical therapy, nitro patches, injections, imaging if not improving.  F/u in 1 month to 6 weeks.  2. Bilateral wrist pain - primarily due to [facility] though has some tenderness at carpal tunnel on left only.  Discussed tylenol, topical medications, supplements that may help.  Thumb spica braces.  Ibuprofen as needed.  Consider physical therapy, injections if not improving.

## 2016-12-14 NOTE — Assessment & Plan Note (Signed)
2/2 rotator cuff impingement.  Shown home exercises to do daily with theraband.  Ibuprofen but advised to consider a different NSAID also.  Consider physical therapy, nitro patches, injections, imaging if not improving.  F/u in 1 month to 6 weeks.

## 2016-12-15 ENCOUNTER — Ambulatory Visit (HOSPITAL_BASED_OUTPATIENT_CLINIC_OR_DEPARTMENT_OTHER)
Admission: RE | Admit: 2016-12-15 | Discharge: 2016-12-15 | Disposition: A | Discharge status: Home / Self Care | Payer: Medicare Other | Source: Ambulatory Visit | Attending: Family Medicine | Admitting: Family Medicine

## 2016-12-15 DIAGNOSIS — I351 Nonrheumatic aortic (valve) insufficiency: Secondary | ICD-10-CM | POA: Diagnosis not present

## 2016-12-15 DIAGNOSIS — R011 Cardiac murmur, unspecified: Secondary | ICD-10-CM | POA: Insufficient documentation

## 2016-12-15 DIAGNOSIS — I517 Cardiomegaly: Secondary | ICD-10-CM | POA: Insufficient documentation

## 2016-12-15 DIAGNOSIS — I340 Nonrheumatic mitral (valve) insufficiency: Secondary | ICD-10-CM | POA: Insufficient documentation

## 2016-12-15 DIAGNOSIS — R002 Palpitations: Secondary | ICD-10-CM | POA: Diagnosis not present

## 2016-12-15 NOTE — Progress Notes (Signed)
  Echocardiogram 2D Echocardiogram has been performed.  Arvil ChacoFoster, Terriah Reggio 12/15/2016, 10:27 AM

## 2017-01-05 ENCOUNTER — Other Ambulatory Visit: Payer: Self-pay | Admitting: Family Medicine

## 2017-01-07 ENCOUNTER — Ambulatory Visit (INDEPENDENT_AMBULATORY_CARE_PROVIDER_SITE_OTHER): Payer: Medicare Other | Admitting: Family Medicine

## 2017-01-07 ENCOUNTER — Encounter: Payer: Self-pay | Admitting: Family Medicine

## 2017-01-07 DIAGNOSIS — R000 Tachycardia, unspecified: Secondary | ICD-10-CM | POA: Diagnosis not present

## 2017-01-07 DIAGNOSIS — J208 Acute bronchitis due to other specified organisms: Secondary | ICD-10-CM

## 2017-01-07 DIAGNOSIS — I10 Essential (primary) hypertension: Secondary | ICD-10-CM

## 2017-01-07 MED ORDER — AZITHROMYCIN 250 MG PO TABS: ORAL_TABLET | ORAL | 0 refills | Status: DC

## 2017-01-07 MED ORDER — METHYLPREDNISOLONE 4 MG PO TABS: ORAL_TABLET | ORAL | 0 refills | Status: DC

## 2017-01-07 MED ORDER — HYDROCODONE-HOMATROPINE 5-1.5 MG/5ML PO SYRP: 5.0000 mL | ORAL_SOLUTION | Freq: Three times a day (TID) | ORAL | 0 refills | Status: DC | PRN

## 2017-01-07 NOTE — Patient Instructions (Signed)
Pertussis, Adult Pertussis, also called whooping cough, is an infection that causes severe and sudden coughing attacks. Pertussis spreads easily from person to person (is contagious). It spreads through the droplets that are sprayed in the air when an infected person talks, coughs, or sneezes. Symptoms of pertussis can last for up to 6 weeks, even though the cough starts to get better. It may take as long as 6 months for the cough to go away completely. What are the causes? This condition is caused by bacteria. The bacteria can spread to someone when he or she:  Inhales droplets that have been sprayed in the air by an infected person.  Touches a surface where the droplets fell and then touches his or her mouth or nose.  What are the signs or symptoms? Symptoms of this condition include cold-like symptoms, such as:  A runny nose.  Low fever.  Mild cough.  Red, watery eyes.  These symptoms develop at the beginning of the infection. After 1-2 weeks, the cold symptoms get better, but the cough worsens, and severe and sudden coughing attacks frequently develop. During these attacks, people may cough so hard that vomiting occurs. How is this diagnosed? This condition may be diagnosed by:  A physical exam.  Lab tests of mucus from the nose and throat.  A blood test.  A chest X-ray.  How is this treated? This condition is treated with antibiotic medicines.  Starting antibiotics quickly may help shorten the illness and make it less contagious.  Antibiotics may also be prescribed for everyone who lives in the same household.   Immunization may be recommended for people in the household who are at risk of developing pertussis. At-risk groups include: ? Infants. ? Those who have not had their full course of pertussis immunizations. ? Those who were immunized but have not had their recent booster shot. Mild coughing may continue for months after the infection is treated. This coughing  may be due to the remaining soreness and inflammation in the lungs. Follow these instructions at home: Medicines   Take over-the-counter and prescription medicines only as told by your health care provider.  Take your antibiotic medicine as told by your health care provider. Do not stop taking the antibiotic even if you start to feel better.  Do not take cough medicine unless told by your health care provider. Coughing attack  If you are having a coughing attack: ? Raise (elevate) the head of your mattress or raise your head with pillows to improve breathing. This will also make it easier to clear out mucus that is brought up from the lungs to the throat during a cough (sputum). ? Sit upright.  Avoid substances that may irritate the lungs, such as smoke, aerosols, or fumes. These substances may make your coughing worse.  Use a cool mist humidifier at home to increase air moisture. This will soothe your cough and help to loosen sputum. Do not use hot steam. Prevent infection  For the first 5 days of antibiotic treatment, stay away from those who are at risk of developing pertussis. If no antibiotics are prescribed, stay at home for the first 3 weeks that you are coughing or as told by your health care provider.  Do not go to work until you have been treated with antibiotics for 5 days. If no antibiotics are prescribed, do not go to work for the first 3 weeks that you are coughing or as told by your health care provider. Tell your workplace  that you were diagnosed with pertussis.  Wash your hands often with soap and water. Everyone in your household should also wash hands often to avoid spreading the infection. If soap and water are not available, use hand sanitizer. General instructions  Rest as much as possible. Slowly go back to your normal activities as told by your health care provider.  Drink enough fluid to keep your urine clear or pale yellow.  Keep all follow-up visits as told  by your health care provider. This is important. How is this prevented?  Pertussis can be prevented with a vaccine and later booster shots.  The pertussis vaccine is given during childhood.  If you are an adult who was never vaccinated, get vaccinated as soon as possible.  If you are an adult who was previously vaccinated, talk with your health care providers about the need for a booster shot because immunity from the vaccine decreases over time.  All of the following people should consider receiving a booster dose of pertussis: ? Pregnant women. ? Everyone who has or will have close contact with an infant who is less than 2512 months old. ? All health care personnel. Contact a health care provider if:  You cannot stop vomiting.  You are not able to eat or drink.  Your cough does not improve.  You have a fever. Get help right away if:  Your face turns red or blue during a coughing attack.  You pass out after a coughing attack, even if only for a few moments.  Your breathing stops for a period of time (apnea).  You are restless or cannot sleep.  You feel sluggish or you are sleeping too much. This information is not intended to replace advice given to you by your health care provider. Make sure you discuss any questions you have with your health care provider. Document Released: 10/30/2012 Document Revised: 01/24/2016 Document Reviewed: 12/22/2015 Elsevier Interactive Patient Education  [employer]2018 [employer]

## 2017-01-07 NOTE — Progress Notes (Signed)
Subjective:  I acted as a Neurosurgeon for Dr. Abner Greenspan. [place], [place]  Patient ID: Eileen Hansen, female    DOB: Jun 05, 1946, 71 y.o.   MRN: [id number]  Chief Complaint  Patient presents with  . Cough    HPI  Patient is in today for an acute visit. Patient c/o coughing for the past 3 days with no production. Cough is barking and keeping her up at night. She notes Head congestion and malaise. She notes clear rhinorrhea. She endorses chills but not fevers. She endorses fatigue, dizziness and weakness. She describes overall myalgias as well as chest pain and back pain with coughing. She is being kept up at night coughing no GI complaints.  Patient Care Team: Bradd Canary, MD as PCP - General (Family Medicine) Carrington Clamp, MD as Consulting Physician (Obstetrics and Gynecology) Rachael Fee, MD as Attending Physician (Gastroenterology) Jimmye Norman, OD (Optometry)   Past Medical History:  Diagnosis Date  . Allergic rhinitis   . Arthralgia 09/30/2015  . Bronchitis, acute 01/09/2017  . Chronic shoulder pain 12/07/2016  . Epistaxis 09/30/2015  . Globus sensation 02/23/2015  . Heart murmur 12/07/2016  . HTN (hypertension)   . Left hip pain 09/30/2015  . Medicare annual wellness visit, subsequent 04/14/2015  . Pain in the wrist, unspecified laterality 03/03/2014  . Tachycardia    Episodic    Past Surgical History:  Procedure Laterality Date  . APPENDECTOMY  71 yrs old  . BUNIONECTOMY    . WISDOM TOOTH EXTRACTION  71 yrs old    Family History  Problem Relation Age of Onset  . Cancer Father        melanoma  . Diabetes Mother   . Hypertension Mother   . Hyperlipidemia Mother   . Stroke Mother   . Heart attack Mother   . Heart attack Brother   . Cancer Brother        pancreatic  . Colon cancer Neg Hx   . Breast cancer Neg Hx   . Coronary artery disease Neg Hx     Social History   Social History  . Marital status: Married    Spouse name: N/A  . Number of children: 1  .  Years of education: N/A   Occupational History  . Homemaker Retired   Social History Main Topics  . Smoking status: Never Smoker  . Smokeless tobacco: Never Used  . Alcohol use Yes     Comment: wine  . Drug use: No  . Sexual activity: Yes     Comment: lives with husband   Other Topics Concern  . Not on file   Social History Narrative   University - [place], Masters Degree - counseling, [employer]   Married '69 - 7 years, divorced; married '82   21 daughter - '72; 2 grandchildren      Regular Exercise -  YES          Outpatient Medications Prior to Visit  Medication Sig Dispense Refill  . aspirin 81 MG tablet Take 81 mg by mouth daily.    . Cholecalciferol (VITAMIN D3) 2000 units TABS Take 1 tablet by mouth daily.    . Choline Fenofibrate 135 MG capsule Take 1 capsule (135 mg total) by mouth daily. 30 capsule 3  . Cyanocobalamin (VITAMIN B-12) 5000 MCG TBDP Take 1 tablet by mouth daily.    . hydrochlorothiazide (MICROZIDE) 12.5 MG capsule TAKE 1 CAPSULE (12.5 MG TOTAL) BY MOUTH DAILY. 90 capsule 0  .  lisinopril (PRINIVIL,ZESTRIL) 10 MG tablet Take 2 tablets (20 mg total) by mouth daily. 180 tablet 1  . metoprolol tartrate (LOPRESSOR) 25 MG tablet Take 0.5 tablets (12.5 mg total) by mouth 2 (two) times daily. 90 tablet 1  . Multiple Minerals-Vitamins (CALCIUM CITRATE PLUS PO) Take 2 tablets by mouth daily.    . cetirizine (ZYRTEC) 10 MG tablet Take 1 tablet (10 mg total) by mouth 2 (two) times daily. 60 tablet 5  . esomeprazole (NEXIUM) 40 MG capsule Take 1 capsule (40 mg total) by mouth every morning. 30 capsule 5  . ranitidine (ZANTAC) 300 MG tablet Take 1 tablet (300 mg total) by mouth at bedtime as needed for heartburn. 30 tablet 5   No facility-administered medications prior to visit.     Allergies  Allergen Reactions  . Penicillins     REACTION: Hives Anaphylaxis    Review of Systems  Constitutional: Positive for chills. Negative for fever and  malaise/fatigue.  HENT: Positive for congestion.   Eyes: Negative for blurred vision.  Respiratory: Positive for cough, sputum production, shortness of breath and wheezing. Negative for hemoptysis.   Cardiovascular: Positive for chest pain. Negative for palpitations and leg swelling.  Gastrointestinal: Negative for abdominal pain, blood in stool and nausea.  Genitourinary: Negative for dysuria and frequency.  Musculoskeletal: Positive for back pain. Negative for falls.  Skin: Negative for rash.  Neurological: Positive for headaches. Negative for dizziness and loss of consciousness.  Endo/Heme/Allergies: Negative for environmental allergies.  Psychiatric/Behavioral: Negative for depression. The patient has insomnia. The patient is not nervous/anxious.        Objective:    Physical Exam  BP 110/60 (BP Location: Left Arm, Patient Position: Sitting, Cuff Size: Normal)   Pulse 73   Temp 98.6 F (37 C) (Oral)   Resp 18   Wt 123 lb 3.2 oz (55.9 kg)   SpO2 97%   BMI 24.88 kg/m  Wt Readings from Last 3 Encounters:  01/07/17 123 lb 3.2 oz (55.9 kg)  12/08/16 124 lb (56.2 kg)  12/06/16 128 lb (58.1 kg)   BP Readings from Last 3 Encounters:  01/07/17 110/60  12/08/16 139/78  12/06/16 124/68     Immunization History  Administered Date(s) Administered  . Influenza Split 05/11/2012  . Influenza Whole 05/31/2007, 05/14/2008, 05/29/2010  . Influenza, High Dose Seasonal PF 04/08/2016  . Influenza,inj,Quad PF,36+ Mos 04/19/2013, 04/05/2014, 04/14/2015  . Pneumococcal Conjugate-13 02/26/2014  . Pneumococcal Polysaccharide-23 03/29/2011  . Tdap 03/29/2011    Health Maintenance  Topic Date Due  . INFLUENZA VACCINE  02/16/2017  . MAMMOGRAM  03/15/2018  . TETANUS/TDAP  03/28/2021  . DEXA SCAN  Completed  . Hepatitis C Screening  Completed  . PNA vac Low Risk Adult  Completed    Lab Results  Component Value Date   WBC 8.7 12/06/2016   HGB 14.1 12/06/2016   HCT 40.9 12/06/2016    PLT 295.0 12/06/2016   GLUCOSE 115 (H) 12/06/2016   CHOL 198 12/06/2016   TRIG (H) 12/06/2016    513.0 Triglyceride is over 400; calculations on Lipids are invalid.   HDL 24.50 (L) 12/06/2016   LDLDIRECT 118.0 12/06/2016   LDLCALC 95 11/14/2014   ALT 17 12/06/2016   AST 18 12/06/2016   NA 139 12/06/2016   K 4.0 12/06/2016   CL 102 12/06/2016   CREATININE 0.69 12/06/2016   BUN 21 12/06/2016   CO2 30 12/06/2016   TSH 1.72 12/06/2016   HGBA1C 6.2 12/06/2016   MICROALBUR <  0.7 04/02/2016    Lab Results  Component Value Date   TSH 1.72 12/06/2016   Lab Results  Component Value Date   WBC 8.7 12/06/2016   HGB 14.1 12/06/2016   HCT 40.9 12/06/2016   MCV 89.9 12/06/2016   PLT 295.0 12/06/2016   Lab Results  Component Value Date   NA 139 12/06/2016   K 4.0 12/06/2016   CO2 30 12/06/2016   GLUCOSE 115 (H) 12/06/2016   BUN 21 12/06/2016   CREATININE 0.69 12/06/2016   BILITOT 0.3 12/06/2016   ALKPHOS 64 12/06/2016   AST 18 12/06/2016   ALT 17 12/06/2016   PROT 7.3 12/06/2016   ALBUMIN 4.5 12/06/2016   CALCIUM 9.9 12/06/2016   ANIONGAP 10 10/17/2014   GFR 88.70 12/06/2016   Lab Results  Component Value Date   CHOL 198 12/06/2016   Lab Results  Component Value Date   HDL 24.50 (L) 12/06/2016   Lab Results  Component Value Date   LDLCALC 95 11/14/2014   Lab Results  Component Value Date   TRIG (H) 12/06/2016    513.0 Triglyceride is over 400; calculations on Lipids are invalid.   Lab Results  Component Value Date   CHOLHDL 8 12/06/2016   Lab Results  Component Value Date   HGBA1C 6.2 12/06/2016         Assessment & Plan:   Problem List Items Addressed This Visit    Essential hypertension    Well controlled, no changes to meds. Encouraged heart healthy diet such as the DASH diet and exercise as tolerated.       Tachycardia    RRR today      Bronchitis, acute    Cough is described as barking and is keeping her up at night. Consider pertussis.  Will start Azithromycin, medrol and hydromet. Start Mucinex, probiotics, elderberry, vitamin c, aged or black garlic, increase rest and hydration         I have discontinued Ms. Grantham's esomeprazole, ranitidine, and cetirizine. I am also having her start on HYDROcodone-homatropine, azithromycin, and methylPREDNISolone. Additionally, I am having her maintain her aspirin, Multiple Minerals-Vitamins (CALCIUM CITRATE PLUS PO), Vitamin D3, Vitamin B-12, metoprolol tartrate, lisinopril, Choline Fenofibrate, and hydrochlorothiazide.  Meds ordered this encounter  Medications  . HYDROcodone-homatropine (HYCODAN) 5-1.5 MG/5ML syrup    Sig: Take 5 mLs by mouth every 8 (eight) hours as needed for cough.    Dispense:  120 mL    Refill:  0  . azithromycin (ZITHROMAX) 250 MG tablet    Sig: 2 tabs po once then 1 tab po daily x 4 days    Dispense:  6 tablet    Refill:  0  . methylPREDNISolone (MEDROL) 4 MG tablet    Sig: 5 tab po qd X 1d then 4 tab po qd X 1d then 3 tab po qd X 1d then 2 tab po qd then 1 tab po qd    Dispense:  15 tablet    Refill:  0    CMA served as Neurosurgeon during this visit. History, Physical and Plan performed by medical provider. Documentation and orders reviewed and attested to.  Danise Edge, MD

## 2017-01-09 ENCOUNTER — Encounter: Payer: Self-pay | Admitting: Family Medicine

## 2017-01-09 DIAGNOSIS — J209 Acute bronchitis, unspecified: Secondary | ICD-10-CM

## 2017-01-09 HISTORY — DX: Acute bronchitis, unspecified: J20.9

## 2017-01-09 NOTE — Assessment & Plan Note (Signed)
RRR today 

## 2017-01-09 NOTE — Assessment & Plan Note (Signed)
Well controlled, no changes to meds. Encouraged heart healthy diet such as the DASH diet and exercise as tolerated.  °

## 2017-01-09 NOTE — Assessment & Plan Note (Signed)
Cough is described as barking and is keeping her up at night. Consider pertussis. Will start Azithromycin, medrol and hydromet. Start Mucinex, probiotics, elderberry, vitamin c, aged or black garlic, increase rest and hydration

## 2017-01-17 ENCOUNTER — Ambulatory Visit: Payer: Medicare Other | Admitting: Family Medicine

## 2017-03-30 ENCOUNTER — Other Ambulatory Visit: Payer: Self-pay | Admitting: Family Medicine

## 2017-03-31 ENCOUNTER — Other Ambulatory Visit: Payer: Self-pay

## 2017-03-31 ENCOUNTER — Other Ambulatory Visit: Payer: Self-pay | Admitting: Family Medicine

## 2017-03-31 MED ORDER — CHOLINE FENOFIBRATE 135 MG PO CPDR: 135.0000 mg | DELAYED_RELEASE_CAPSULE | Freq: Every day | ORAL | 2 refills | Status: DC

## 2017-03-31 NOTE — Telephone Encounter (Signed)
Faxed Choline Fenofibrate 30d+2 till OV/thx dmf

## 2017-04-08 ENCOUNTER — Other Ambulatory Visit: Payer: Self-pay | Admitting: Family Medicine

## 2017-04-11 ENCOUNTER — Encounter: Payer: Self-pay | Admitting: Family Medicine

## 2017-04-11 ENCOUNTER — Ambulatory Visit (INDEPENDENT_AMBULATORY_CARE_PROVIDER_SITE_OTHER): Payer: Medicare Other | Admitting: Family Medicine

## 2017-04-11 VITALS — BP 120/70 | HR 65 | Temp 97.5°F | Resp 18 | Wt 125.2 lb

## 2017-04-11 DIAGNOSIS — R000 Tachycardia, unspecified: Secondary | ICD-10-CM

## 2017-04-11 DIAGNOSIS — Z23 Encounter for immunization: Secondary | ICD-10-CM

## 2017-04-11 DIAGNOSIS — R7303 Prediabetes: Secondary | ICD-10-CM | POA: Diagnosis not present

## 2017-04-11 DIAGNOSIS — R739 Hyperglycemia, unspecified: Secondary | ICD-10-CM | POA: Diagnosis not present

## 2017-04-11 DIAGNOSIS — R0789 Other chest pain: Secondary | ICD-10-CM | POA: Diagnosis not present

## 2017-04-11 DIAGNOSIS — E782 Mixed hyperlipidemia: Secondary | ICD-10-CM | POA: Diagnosis not present

## 2017-04-11 DIAGNOSIS — I10 Essential (primary) hypertension: Secondary | ICD-10-CM

## 2017-04-11 NOTE — Telephone Encounter (Signed)
error:[id number] ° °

## 2017-04-11 NOTE — Patient Instructions (Addendum)
Shingrix is the new shingles shot, 2 shots over 2-6 months.  Add 2 fish oil caps and an 81 mg aspirin Carbohydrate Counting for Diabetes Mellitus, Adult Carbohydrate counting is a method for keeping track of how many carbohydrates you eat. Eating carbohydrates naturally increases the amount of sugar (glucose) in the blood. Counting how many carbohydrates you eat helps keep your blood glucose within normal limits, which helps you manage your diabetes (diabetes mellitus). It is important to know how many carbohydrates you can safely have in each meal. This is different for every person. A diet and nutrition specialist (registered dietitian) can help you make a meal plan and calculate how many carbohydrates you should have at each meal and snack. Carbohydrates are found in the following foods:  Grains, such as breads and cereals.  Dried beans and soy products.  Starchy vegetables, such as potatoes, peas, and corn.  Fruit and fruit juices.  Milk and yogurt.  Sweets and snack foods, such as cake, cookies, candy, chips, and soft drinks.  How do I count carbohydrates? There are two ways to count carbohydrates in food. You can use either of the methods or a combination of both. Reading "Nutrition Facts" on packaged food The "Nutrition Facts" list is included on the labels of almost all packaged foods and beverages in the [place] It includes:  The serving size.  Information about nutrients in each serving, including the grams (g) of carbohydrate per serving.  To use the "Nutrition Facts":  Decide how many servings you will have.  Multiply the number of servings by the number of carbohydrates per serving.  The resulting number is the total amount of carbohydrates that you will be having.  Learning standard serving sizes of other foods When you eat foods containing carbohydrates that are not packaged or do not include "Nutrition Facts" on the label, you need to measure the servings in order to  count the amount of carbohydrates:  Measure the foods that you will eat with a food scale or measuring cup, if needed.  Decide how many standard-size servings you will eat.  Multiply the number of servings by 15. Most carbohydrate-rich foods have about 15 g of carbohydrates per serving. ? For example, if you eat 8 oz (170 g) of strawberries, you will have eaten 2 servings and 30 g of carbohydrates (2 servings x 15 g = 30 g).  For foods that have more than one food mixed, such as soups and casseroles, you must count the carbohydrates in each food that is included.  The following list contains standard serving sizes of common carbohydrate-rich foods. Each of these servings has about 15 g of carbohydrates:   hamburger bun or  English muffin.   oz (15 mL) syrup.   oz (14 g) jelly.  1 slice of bread.  1 six-inch tortilla.  3 oz (85 g) cooked rice or pasta.  4 oz (113 g) cooked dried beans.  4 oz (113 g) starchy vegetable, such as peas, corn, or potatoes.  4 oz (113 g) hot cereal.  4 oz (113 g) mashed potatoes or  of a large baked potato.  4 oz (113 g) canned or frozen fruit.  4 oz (120 mL) fruit juice.  4-6 crackers.  6 chicken nuggets.  6 oz (170 g) unsweetened dry cereal.  6 oz (170 g) plain fat-free yogurt or yogurt sweetened with artificial sweeteners.  8 oz (240 mL) milk.  8 oz (170 g) fresh fruit or one small piece of  fruit.  24 oz (680 g) popped popcorn.  Example of carbohydrate counting Sample meal  3 oz (85 g) chicken breast.  6 oz (170 g) brown rice.  4 oz (113 g) corn.  8 oz (240 mL) milk.  8 oz (170 g) strawberries with sugar-free whipped topping. Carbohydrate calculation 1. Identify the foods that contain carbohydrates: ? Rice. ? Corn. ? Milk. ? Strawberries. 2. Calculate how many servings you have of each food: ? 2 servings rice. ? 1 serving corn. ? 1 serving milk. ? 1 serving strawberries. 3. Multiply each number of servings by  15 g: ? 2 servings rice x 15 g = 30 g. ? 1 serving corn x 15 g = 15 g. ? 1 serving milk x 15 g = 15 g. ? 1 serving strawberries x 15 g = 15 g. 4. Add together all of the amounts to find the total grams of carbohydrates eaten: ? 30 g + 15 g + 15 g + 15 g = 75 g of carbohydrates total. This information is not intended to replace advice given to you by your health care provider. Make sure you discuss any questions you have with your health care provider. Document Released: 07/05/2005 Document Revised: 01/23/2016 Document Reviewed: 12/17/2015 Elsevier Interactive Patient Education  [employer].

## 2017-04-11 NOTE — Assessment & Plan Note (Signed)
Encouraged heart healthy diet, increase exercise, avoid trans fats, consider a krill oil cap daily. Triglycerides up significant at last blood then developed pain in abdomen and muscle pain so 2 days ago stopped the fenofibrate we started. No change in symptoms since then.

## 2017-04-11 NOTE — Progress Notes (Signed)
Subjective:  I acted as a Neurosurgeon for Dr. Abner Greenspan. [place], [place]  Patient ID: Eileen Hansen, female    DOB: 12/03/1945, 71 y.o.   MRN: [id number]  No chief complaint on file.   HPI  Patient is in today for  A medication follow up. She feels well today. No recent febrile illness or acute hospitalizations. She has noted some intermittent chest pain, that is infreuqent and can be altered by position changes. No associated sob, diaphoresis, nausea or palpitations. Never awakens her. Denies palp/SOB/HA/congestion/fevers/GI or GU c/o. Taking meds as prescribed  Patient Care Team: Bradd Canary, MD as PCP - General (Family Medicine) Carrington Clamp, MD as Consulting Physician (Obstetrics and Gynecology) Rachael Fee, MD as Attending Physician (Gastroenterology) Jimmye Norman, OD (Optometry)   Past Medical History:  Diagnosis Date  . Allergic rhinitis   . Arthralgia 09/30/2015  . Bronchitis, acute 01/09/2017  . Chronic shoulder pain 12/07/2016  . Epistaxis 09/30/2015  . Globus sensation 02/23/2015  . Heart murmur 12/07/2016  . HTN (hypertension)   . Left hip pain 09/30/2015  . Medicare annual wellness visit, subsequent 04/14/2015  . Pain in the wrist, unspecified laterality 03/03/2014  . Tachycardia    Episodic    Past Surgical History:  Procedure Laterality Date  . APPENDECTOMY  71 yrs old  . BUNIONECTOMY    . WISDOM TOOTH EXTRACTION  71 yrs old    Family History  Problem Relation Age of Onset  . Cancer Father        melanoma  . Diabetes Mother   . Hypertension Mother   . Hyperlipidemia Mother   . Stroke Mother   . Heart attack Mother   . Heart attack Brother   . Cancer Brother        pancreatic  . Colon cancer Neg Hx   . Breast cancer Neg Hx   . Coronary artery disease Neg Hx     Social History   Social History  . Marital status: Married    Spouse name: N/A  . Number of children: 1  . Years of education: N/A   Occupational History  . Homemaker Retired    Social History Main Topics  . Smoking status: Never Smoker  . Smokeless tobacco: Never Used  . Alcohol use Yes     Comment: wine  . Drug use: No  . Sexual activity: Yes     Comment: lives with husband   Other Topics Concern  . Not on file   Social History Narrative   University - [place], Masters Degree - counseling, [employer]   Married '69 - 7 years, divorced; married '82   21 daughter - '72; 2 grandchildren      Regular Exercise -  YES          Outpatient Medications Prior to Visit  Medication Sig Dispense Refill  . aspirin 81 MG tablet Take 81 mg by mouth daily.    . Cholecalciferol (VITAMIN D3) 2000 units TABS Take 1 tablet by mouth daily.    . Choline Fenofibrate 135 MG capsule Take 1 capsule (135 mg total) by mouth daily. 30 capsule 2  . Cyanocobalamin (VITAMIN B-12) 5000 MCG TBDP Take 1 tablet by mouth daily.    . hydrochlorothiazide (MICROZIDE) 12.5 MG capsule TAKE ONE CAPSULE BY MOUTH ONE TIME DAILY  90 capsule 0  . HYDROcodone-homatropine (HYCODAN) 5-1.5 MG/5ML syrup Take 5 mLs by mouth every 8 (eight) hours as needed for cough. 120 mL 0  .  lisinopril (PRINIVIL,ZESTRIL) 10 MG tablet TAKE TWO TABLETS BY MOUTH DAILY  180 tablet 0  . metoprolol tartrate (LOPRESSOR) 25 MG tablet TAKE HALF TABLET BY MOUTH TWICE DAILY  90 tablet 0  . Multiple Minerals-Vitamins (CALCIUM CITRATE PLUS PO) Take 2 tablets by mouth daily.    Marland Kitchen azithromycin (ZITHROMAX) 250 MG tablet 2 tabs po once then 1 tab po daily x 4 days 6 tablet 0  . methylPREDNISolone (MEDROL) 4 MG tablet 5 tab po qd X 1d then 4 tab po qd X 1d then 3 tab po qd X 1d then 2 tab po qd then 1 tab po qd 15 tablet 0   No facility-administered medications prior to visit.     Allergies  Allergen Reactions  . Penicillins     REACTION: Hives Anaphylaxis    Review of Systems  Constitutional: Negative for fever and malaise/fatigue.  HENT: Negative for congestion.   Eyes: Negative for blurred vision.   Respiratory: Negative for shortness of breath.   Cardiovascular: Positive for chest pain. Negative for palpitations and leg swelling.  Gastrointestinal: Negative for abdominal pain, blood in stool and nausea.  Genitourinary: Negative for dysuria and frequency.  Musculoskeletal: Negative for falls.  Skin: Negative for rash.  Neurological: Negative for dizziness, loss of consciousness and headaches.  Endo/Heme/Allergies: Negative for environmental allergies.  Psychiatric/Behavioral: Negative for depression. The patient is not nervous/anxious.        Objective:    Physical Exam  Constitutional: She is oriented to person, place, and time. She appears well-developed and well-nourished. No distress.  HENT:  Head: Normocephalic and atraumatic.  Nose: Nose normal.  Eyes: Right eye exhibits no discharge. Left eye exhibits no discharge.  Neck: Normal range of motion. Neck supple.  Cardiovascular: Normal rate and regular rhythm.   No murmur heard. Pulmonary/Chest: Effort normal and breath sounds normal.  Abdominal: Soft. Bowel sounds are normal. There is no tenderness.  Musculoskeletal: She exhibits no edema.  Neurological: She is alert and oriented to person, place, and time.  Skin: Skin is warm and dry.  Psychiatric: She has a normal mood and affect.  Nursing note and vitals reviewed.   BP 120/70 (BP Location: Left Arm, Patient Position: Sitting, Cuff Size: Normal)   Pulse 65   Temp (!) 97.5 F (36.4 C) (Oral)   Resp 18   Wt 125 lb 3.2 oz (56.8 kg)   SpO2 98%   BMI 25.29 kg/m  Wt Readings from Last 3 Encounters:  04/11/17 125 lb 3.2 oz (56.8 kg)  01/07/17 123 lb 3.2 oz (55.9 kg)  12/08/16 124 lb (56.2 kg)   BP Readings from Last 3 Encounters:  04/11/17 120/70  01/07/17 110/60  12/08/16 139/78     Immunization History  Administered Date(s) Administered  . Influenza Split 05/11/2012  . Influenza Whole 05/31/2007, 05/14/2008, 05/29/2010  . Influenza, High Dose Seasonal  PF 04/08/2016, 04/11/2017  . Influenza,inj,Quad PF,6+ Mos 04/19/2013, 04/05/2014, 04/14/2015  . Pneumococcal Conjugate-13 02/26/2014  . Pneumococcal Polysaccharide-23 03/29/2011  . Tdap 03/29/2011    Health Maintenance  Topic Date Due  . MAMMOGRAM  03/15/2018  . TETANUS/TDAP  03/28/2021  . INFLUENZA VACCINE  Completed  . DEXA SCAN  Completed  . Hepatitis C Screening  Completed  . PNA vac Low Risk Adult  Completed    Lab Results  Component Value Date   WBC 7.8 04/11/2017   HGB 13.3 04/11/2017   HCT 39.5 04/11/2017   PLT 306.0 04/11/2017   GLUCOSE 126 (H) 04/11/2017  CHOL 198 12/06/2016   TRIG (H) 12/06/2016    513.0 Triglyceride is over 400; calculations on Lipids are invalid.   HDL 24.50 (L) 12/06/2016   LDLDIRECT 118.0 12/06/2016   LDLCALC 95 11/14/2014   ALT 18 04/11/2017   AST 17 04/11/2017   NA 140 04/11/2017   K 3.6 04/11/2017   CL 103 04/11/2017   CREATININE 0.77 04/11/2017   BUN 23 04/11/2017   CO2 32 04/11/2017   TSH 1.67 04/11/2017   HGBA1C 6.1 04/11/2017   MICROALBUR <0.7 04/02/2016    Lab Results  Component Value Date   TSH 1.67 04/11/2017   Lab Results  Component Value Date   WBC 7.8 04/11/2017   HGB 13.3 04/11/2017   HCT 39.5 04/11/2017   MCV 91.1 04/11/2017   PLT 306.0 04/11/2017   Lab Results  Component Value Date   NA 140 04/11/2017   K 3.6 04/11/2017   CO2 32 04/11/2017   GLUCOSE 126 (H) 04/11/2017   BUN 23 04/11/2017   CREATININE 0.77 04/11/2017   BILITOT 0.4 04/11/2017   ALKPHOS 52 04/11/2017   AST 17 04/11/2017   ALT 18 04/11/2017   PROT 7.2 04/11/2017   ALBUMIN 4.4 04/11/2017   CALCIUM 10.3 04/11/2017   ANIONGAP 10 10/17/2014   GFR 78.08 04/11/2017   Lab Results  Component Value Date   CHOL 198 12/06/2016   Lab Results  Component Value Date   HDL 24.50 (L) 12/06/2016   Lab Results  Component Value Date   LDLCALC 95 11/14/2014   Lab Results  Component Value Date   TRIG (H) 12/06/2016    513.0 Triglyceride is  over 400; calculations on Lipids are invalid.   Lab Results  Component Value Date   CHOLHDL 8 12/06/2016   Lab Results  Component Value Date   HGBA1C 6.1 04/11/2017         Assessment & Plan:   Problem List Items Addressed This Visit    Essential hypertension    Well controlled, no changes to meds. Encouraged heart healthy diet such as the DASH diet and exercise as tolerated.       Tachycardia    RRR today      Relevant Orders   CBC (Completed)   Comprehensive metabolic panel (Completed)   Hyperlipidemia, mixed    Encouraged heart healthy diet, increase exercise, avoid trans fats, consider a krill oil cap daily. Triglycerides up significant at last blood then developed pain in abdomen and muscle pain so 2 days ago stopped the fenofibrate we started. No change in symptoms since then.      Relevant Orders   CBC (Completed)   TSH (Completed)   Borderline diabetes    hgba1c acceptable, minimize simple carbs. Increase exercise as tolerated.       Atypical chest pain    EKG no concerning changes and does change some with position changes. No symptoms today. If symptoms change or worsen then will let us know for referral to cardiology and/or will seek care.       Relevant Orders   EKG 12-Lead (Completed)    Other Visit Diagnoses    Needs flu shot    -  Primary   Relevant Orders   Flu vaccine HIGH DOSE PF (Fluzone High dose) (Completed)   Hyperglycemia       Relevant Orders   CBC (Completed)   Comprehensive metabolic panel (Completed)   Hemoglobin A1c (Completed)   TSH (Completed)      I have  discontinued Ms. Deleon's azithromycin and methylPREDNISolone. I am also having her maintain her aspirin, Multiple Minerals-Vitamins (CALCIUM CITRATE PLUS PO), Vitamin D3, Vitamin B-12, HYDROcodone-homatropine, Choline Fenofibrate, hydrochlorothiazide, lisinopril, and metoprolol tartrate.  No orders of the defined types were placed in this encounter.   CMA served as Neurosurgeon  during this visit. History, Physical and Plan performed by medical provider. Documentation and orders reviewed and attested to.  Danise Edge, MD

## 2017-04-12 LAB — COMPREHENSIVE METABOLIC PANEL
ALT: 18 U/L (ref 0–35)
AST: 17 U/L (ref 0–37)
Albumin: 4.4 g/dL (ref 3.5–5.2)
Alkaline Phosphatase: 52 U/L (ref 39–117)
BUN: 23 mg/dL (ref 6–23)
CALCIUM: 10.3 mg/dL (ref 8.4–10.5)
CO2: 32 meq/L (ref 19–32)
Chloride: 103 mEq/L (ref 96–112)
Creatinine, Ser: 0.77 mg/dL (ref 0.40–1.20)
GFR: 78.08 mL/min (ref >60.00)
GLUCOSE: 126 mg/dL — AB (ref 70–99)
POTASSIUM: 3.6 meq/L (ref 3.5–5.1)
Sodium: 140 mEq/L (ref 135–145)
Total Bilirubin: 0.4 mg/dL (ref 0.2–1.2)
Total Protein: 7.2 g/dL (ref 6.0–8.3)

## 2017-04-12 LAB — HEMOGLOBIN A1C: HEMOGLOBIN A1C: 6.1 % (ref 4.6–6.5)

## 2017-04-12 LAB — CBC
HCT: 39.5 % (ref 36.0–46.0)
HEMOGLOBIN: 13.3 g/dL (ref 12.0–15.0)
MCHC: 33.8 g/dL (ref 30.0–36.0)
MCV: 91.1 fl (ref 78.0–100.0)
Platelets: 306 10*3/uL (ref 150.0–400.0)
RBC: 4.33 Mil/uL (ref 3.87–5.11)
RDW: 12.4 % (ref 11.5–15.5)
WBC: 7.8 10*3/uL (ref 4.0–10.5)

## 2017-04-12 LAB — TSH: TSH: 1.67 u[IU]/mL (ref 0.35–4.50)

## 2017-04-17 DIAGNOSIS — R0789 Other chest pain: Secondary | ICD-10-CM | POA: Insufficient documentation

## 2017-04-17 NOTE — Assessment & Plan Note (Signed)
EKG no concerning changes and does change some with position changes. No symptoms today. If symptoms change or worsen then will let us know for referral to cardiology and/or will seek care.

## 2017-04-17 NOTE — Assessment & Plan Note (Signed)
Well controlled, no changes to meds. Encouraged heart healthy diet such as the DASH diet and exercise as tolerated.  °

## 2017-04-17 NOTE — Assessment & Plan Note (Signed)
hgba1c acceptable, minimize simple carbs. Increase exercise as tolerated.  

## 2017-04-17 NOTE — Assessment & Plan Note (Signed)
RRR today 

## 2017-04-22 ENCOUNTER — Encounter: Payer: Self-pay | Admitting: Family Medicine

## 2017-04-28 ENCOUNTER — Encounter: Payer: Self-pay | Admitting: Family Medicine

## 2017-06-07 ENCOUNTER — Encounter: Payer: Self-pay | Admitting: Family Medicine

## 2017-06-07 ENCOUNTER — Ambulatory Visit: Payer: Medicare Other | Admitting: Family Medicine

## 2017-06-07 VITALS — BP 146/77 | HR 73 | Temp 98.1°F | Resp 16 | Ht 59.06 in | Wt 123.6 lb

## 2017-06-07 DIAGNOSIS — M25511 Pain in right shoulder: Secondary | ICD-10-CM

## 2017-06-07 DIAGNOSIS — E782 Mixed hyperlipidemia: Secondary | ICD-10-CM

## 2017-06-07 DIAGNOSIS — I10 Essential (primary) hypertension: Secondary | ICD-10-CM

## 2017-06-07 DIAGNOSIS — G8929 Other chronic pain: Secondary | ICD-10-CM | POA: Diagnosis not present

## 2017-06-07 DIAGNOSIS — R7303 Prediabetes: Secondary | ICD-10-CM

## 2017-06-07 DIAGNOSIS — M25562 Pain in left knee: Secondary | ICD-10-CM

## 2017-06-07 DIAGNOSIS — M25561 Pain in right knee: Secondary | ICD-10-CM

## 2017-06-07 DIAGNOSIS — Z0000 Encounter for general adult medical examination without abnormal findings: Secondary | ICD-10-CM | POA: Diagnosis not present

## 2017-06-07 DIAGNOSIS — M25512 Pain in left shoulder: Secondary | ICD-10-CM | POA: Diagnosis not present

## 2017-06-07 DIAGNOSIS — M8580 Other specified disorders of bone density and structure, unspecified site: Secondary | ICD-10-CM

## 2017-06-07 DIAGNOSIS — R000 Tachycardia, unspecified: Secondary | ICD-10-CM | POA: Diagnosis not present

## 2017-06-07 MED ORDER — HYDROCHLOROTHIAZIDE 25 MG PO TABS: 25.0000 mg | ORAL_TABLET | Freq: Every day | ORAL | 3 refills | Status: DC

## 2017-06-07 NOTE — Assessment & Plan Note (Signed)
Right shoulder pain and decreased ROM

## 2017-06-07 NOTE — Patient Instructions (Addendum)
Curcumen/Turmeric daily [email or web address].com buy NOW companies Fish oil caps Zostavax is the old shot, 1 shot series  Shingrix is the new shot, 2 shot series, 0VER 2-6 MONTHS   Hypertension Hypertension is another name for high blood pressure. High blood pressure forces your heart to work harder to pump blood. This can cause problems over time. There are two numbers in a blood pressure reading. There is a top number (systolic) over a bottom number (diastolic). It is best to have a blood pressure below 120/80. Healthy choices can help lower your blood pressure. You may need medicine to help lower your blood pressure if:  Your blood pressure cannot be lowered with healthy choices.  Your blood pressure is higher than 130/80.  Follow these instructions at home: Eating and drinking  If directed, follow the DASH eating plan. This diet includes: ? Filling half of your plate at each meal with fruits and vegetables. ? Filling one quarter of your plate at each meal with whole grains. Whole grains include whole wheat pasta, brown rice, and whole grain bread. ? Eating or drinking low-fat dairy products, such as skim milk or low-fat yogurt. ? Filling one quarter of your plate at each meal with low-fat (lean) proteins. Low-fat proteins include fish, skinless chicken, eggs, beans, and tofu. ? Avoiding fatty meat, cured and processed meat, or chicken with skin. ? Avoiding premade or processed food.  Eat less than 1,500 mg of salt (sodium) a day.  Limit alcohol use to no more than 1 drink a day for nonpregnant women and 2 drinks a day for men. One drink equals 12 oz of beer, 5 oz of wine, or 1 oz of hard liquor. Lifestyle  Work with your doctor to stay at a healthy weight or to lose weight. Ask your doctor what the best weight is for you.  Get at least 30 minutes of exercise that causes your heart to beat faster (aerobic exercise) most days of the week. This may include walking, swimming, or  biking.  Get at least 30 minutes of exercise that strengthens your muscles (resistance exercise) at least 3 days a week. This may include lifting weights or pilates.  Do not use any products that contain nicotine or tobacco. This includes cigarettes and e-cigarettes. If you need help quitting, ask your doctor.  Check your blood pressure at home as told by your doctor.  Keep all follow-up visits as told by your doctor. This is important. Medicines  Take over-the-counter and prescription medicines only as told by your doctor. Follow directions carefully.  Do not skip doses of blood pressure medicine. The medicine does not work as well if you skip doses. Skipping doses also puts you at risk for problems.  Ask your doctor about side effects or reactions to medicines that you should watch for. Contact a doctor if:  You think you are having a reaction to the medicine you are taking.  You have headaches that keep coming back (recurring).  You feel dizzy.  You have swelling in your ankles.  You have trouble with your vision. Get help right away if:  You get a very bad headache.  You start to feel confused.  You feel weak or numb.  You feel faint.  You get very bad pain in your: ? Chest. ? Belly (abdomen).  You throw up (vomit) more than once.  You have trouble breathing. Summary  Hypertension is another name for high blood pressure.  Making healthy choices can help lower blood pressure.  If your blood pressure cannot be controlled with healthy choices, you may need to take medicine. This information is not intended to replace advice given to you by your health care provider. Make sure you discuss any questions you have with your health care provider. Document Released: 12/22/2007 Document Revised: 06/02/2016 Document Reviewed: 06/02/2016 Elsevier Interactive Patient Education  [employer]2018 Elsevier Inc.

## 2017-06-07 NOTE — Progress Notes (Signed)
Subjective:  I acted as a Neurosurgeonscribe for Textron IncDr.Blyth. Fuller SongShaneka, RMA   Patient ID: Eileen BalzarineJuana E Bielak, female    DOB: 1946-06-01, 71 y.o.   MRN: [id number]  Chief Complaint  Patient presents with  . Annual Exam    HPI  Patient is in today for annual exam and follow up on chronic medical concerns including hypertension, hyperlipidemia, chronic pain and more. She feels well today. No recent febrile illness or hospitalization. No trauma or falls but is noting worsening left knee pain and right shoulder pain. No swelling or warmth. Doing well with ADLs. Denies CP/palp/SOB/HA/congestion/fevers/GI or GU c/o. Taking meds as prescribed. Is staying active and maintaining a heart healthy diet  Patient Care Team: Bradd CanaryBlyth, Stacey A, MD as PCP - General (Family Medicine) Carrington ClampHorvath, Michelle, MD as Consulting Physician (Obstetrics and Gynecology) Rachael FeeJacobs, Daniel P, MD as Attending Physician (Gastroenterology) Jimmye NormanMiller, Steven, OD (Optometry)   Past Medical History:  Diagnosis Date  . Allergic rhinitis   . Arthralgia 09/30/2015  . Bronchitis, acute 01/09/2017  . Chronic shoulder pain 12/07/2016  . Epistaxis 09/30/2015  . Globus sensation 02/23/2015  . Heart murmur 12/07/2016  . HTN (hypertension)   . Left hip pain 09/30/2015  . Medicare annual wellness visit, subsequent 04/14/2015  . Pain in the wrist, unspecified laterality 03/03/2014  . Tachycardia    Episodic    Past Surgical History:  Procedure Laterality Date  . APPENDECTOMY  71 yrs old  . BUNIONECTOMY    . WISDOM TOOTH EXTRACTION  71 yrs old    Family History  Problem Relation Age of Onset  . Cancer Father        melanoma  . Diabetes Mother   . Hypertension Mother   . Hyperlipidemia Mother   . Stroke Mother   . Heart attack Mother   . Heart attack Brother   . Cancer Brother        pancreatic  . Colon cancer Neg Hx   . Breast cancer Neg Hx   . Coronary artery disease Neg Hx     Social History   Socioeconomic History  . Marital status: Married     Spouse name: Not on file  . Number of children: 1  . Years of education: Not on file  . Highest education level: Not on file  Social Needs  . Financial resource strain: Not on file  . Food insecurity - worry: Not on file  . Food insecurity - inability: Not on file  . Transportation needs - medical: Not on file  . Transportation needs - non-medical: Not on file  Occupational History  . Occupation: DentistHomemaker    Employer: RETIRED  Tobacco Use  . Smoking status: Never Smoker  . Smokeless tobacco: Never Used  Substance and Sexual Activity  . Alcohol use: Yes    Comment: wine  . Drug use: No  . Sexual activity: Yes    Comment: lives with husband  Other Topics Concern  . 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 aspirin 81 MG tablet Take 81 mg by mouth daily.    . Cholecalciferol (VITAMIN D3) 2000 units TABS Take 1 tablet by  mouth daily.    . Choline Fenofibrate 135 MG capsule Take 1 capsule (135 mg total) by mouth daily. 30 capsule 2  . Cyanocobalamin (VITAMIN B-12) 5000 MCG TBDP Take 1 tablet by mouth daily.    Marland Kitchen HYDROcodone-homatropine (HYCODAN) 5-1.5 MG/5ML syrup Take 5 mLs by mouth every 8 (eight) hours as needed for cough. 120 mL 0  . lisinopril (PRINIVIL,ZESTRIL) 10 MG tablet TAKE TWO TABLETS BY MOUTH DAILY  180 tablet 0  . metoprolol tartrate (LOPRESSOR) 25 MG tablet TAKE HALF TABLET BY MOUTH TWICE DAILY  90 tablet 0  . Multiple Minerals-Vitamins (CALCIUM CITRATE PLUS PO) Take 2 tablets by mouth daily.     No facility-administered medications prior to visit.     Allergies  Allergen Reactions  . Fenofibrate Other (See Comments)    Abdominal pain  . Penicillins     REACTION: Hives Anaphylaxis     Review of Systems  Constitutional: Negative for fever and malaise/fatigue.  HENT: Negative for congestion.   Eyes: Negative for blurred vision.  Respiratory: Negative for shortness of breath.   Cardiovascular: Negative for chest pain, palpitations and leg swelling.  Gastrointestinal: Negative for abdominal pain, blood in stool and nausea.  Genitourinary: Negative for dysuria and frequency.  Musculoskeletal: Positive for joint pain. Negative for falls.  Skin: Negative for rash.  Neurological: Negative for dizziness, loss of consciousness and headaches.  Endo/Heme/Allergies: Negative for environmental allergies.  Psychiatric/Behavioral: Negative for depression. The patient is not nervous/anxious.        Objective:    Physical Exam  Constitutional: She is oriented to person, place, and time. She appears well-developed and well-nourished. No distress.  HENT:  Head: Normocephalic and atraumatic.  Eyes: Conjunctivae are normal.  Neck: Neck supple. No thyromegaly present.  Cardiovascular: Normal rate, regular rhythm and normal heart sounds.  No murmur heard. Pulmonary/Chest: Effort normal and breath sounds normal. No respiratory distress.  Abdominal: Soft. Bowel sounds are normal. She exhibits no distension and no mass. There is no tenderness.  Musculoskeletal: She exhibits no edema.  Lymphadenopathy:    She has no cervical adenopathy.  Neurological: She is alert and oriented to person, place, and time.  Skin: Skin is warm and dry.  Psychiatric: She has a normal mood and affect. Her behavior is normal.    BP (!) 146/77 (BP Location: Right Arm, Patient Position: Sitting, Cuff Size: Small)   Pulse 73   Temp 98.1 F (36.7 C) (Oral)   Resp 16   Ht 4' 11.06" (1.5 m)   Wt 123 lb 9.6 oz (56.1 kg)   SpO2 98%   BMI 24.92 kg/m  Wt Readings from Last 3 Encounters:  06/07/17 123 lb 9.6 oz (56.1 kg)  04/11/17 125 lb 3.2 oz (56.8 kg)  01/07/17 123 lb 3.2 oz (55.9 kg)   BP Readings  from Last 3 Encounters:  06/07/17 (!) 146/77  04/11/17 120/70  01/07/17 110/60     Immunization History  Administered Date(s) Administered  . Influenza Split 05/11/2012  . Influenza Whole 05/31/2007, 05/14/2008, 05/29/2010  . Influenza, High Dose Seasonal PF 04/08/2016, 04/11/2017  . Influenza,inj,Quad PF,6+ Mos 04/19/2013, 04/05/2014, 04/14/2015  . Pneumococcal Conjugate-13 02/26/2014  . Pneumococcal Polysaccharide-23 03/29/2011  . Tdap 03/29/2011    Health Maintenance  Topic Date Due  . MAMMOGRAM  03/15/2018  . TETANUS/TDAP  03/28/2021  . INFLUENZA VACCINE  Completed  . DEXA SCAN  Completed  . Hepatitis C Screening  Completed  . PNA vac Low Risk Adult  Completed  Lab Results  Component Value Date   WBC 7.8 04/11/2017   HGB 13.3 04/11/2017   HCT 39.5 04/11/2017   PLT 306.0 04/11/2017   GLUCOSE 114 (H) 06/07/2017   CHOL 218 (H) 06/07/2017   TRIG 323.0 (H) 06/07/2017   HDL 28.40 (L) 06/07/2017   LDLDIRECT 144.0 06/07/2017   LDLCALC 95 11/14/2014   ALT 14 06/07/2017   AST 16 06/07/2017   NA 144 06/07/2017   K 4.4 06/07/2017   CL 106 06/07/2017   CREATININE 0.75 06/07/2017   BUN 23 06/07/2017   CO2 30 06/07/2017   TSH 1.67 04/11/2017   HGBA1C 6.1 04/11/2017   MICROALBUR <0.7 04/02/2016    Lab Results  Component Value Date   TSH 1.67 04/11/2017   Lab Results  Component Value Date   WBC 7.8 04/11/2017   HGB 13.3 04/11/2017   HCT 39.5 04/11/2017   MCV 91.1 04/11/2017   PLT 306.0 04/11/2017   Lab Results  Component Value Date   NA 144 06/07/2017   K 4.4 06/07/2017   CO2 30 06/07/2017   GLUCOSE 114 (H) 06/07/2017   BUN 23 06/07/2017   CREATININE 0.75 06/07/2017   BILITOT 0.3 06/07/2017   ALKPHOS 69 06/07/2017   AST 16 06/07/2017   ALT 14 06/07/2017   PROT 7.3 06/07/2017   ALBUMIN 4.5 06/07/2017   CALCIUM 10.4 06/07/2017   ANIONGAP 10 10/17/2014   GFR 80.45 06/07/2017   Lab Results  Component Value Date   CHOL 218 (H) 06/07/2017   Lab  Results  Component Value Date   HDL 28.40 (L) 06/07/2017   Lab Results  Component Value Date   LDLCALC 95 11/14/2014   Lab Results  Component Value Date   TRIG 323.0 (H) 06/07/2017   Lab Results  Component Value Date   CHOLHDL 8 06/07/2017   Lab Results  Component Value Date   HGBA1C 6.1 04/11/2017         Assessment & Plan:   Problem List Items Addressed This Visit    Essential hypertension    Well controlled, no changes to meds. Encouraged heart healthy diet such as the DASH diet and exercise as tolerated.       Relevant Medications   hydrochlorothiazide (HYDRODIURIL) 25 MG tablet   Tachycardia    RRR today      Hyperlipidemia, mixed    Encouraged heart healthy diet, increase exercise, avoid trans fats, consider a krill oil cap daily      Relevant Medications   hydrochlorothiazide (HYDRODIURIL) 25 MG tablet   Preventative health care    Patient encouraged to maintain heart healthy diet, regular exercise, adequate sleep. Consider daily probiotics. Take medications as prescribed      Borderline diabetes    hgba1c acceptable, minimize simple carbs. Increase exercise as tolerated. Continue current meds      Osteopenia    Encouraged to get adequate exercise, calcium and vitamin d intake      Arthralgia    Left knee pain and right shoulder pain worsening, referred to ortho for further consideration. Try topical lidocaine prn.       Chronic shoulder pain    Right shoulder pain and decreased ROM      Relevant Orders   Ambulatory referral to Orthopedic Surgery    Other Visit Diagnoses    Left shoulder pain, unspecified chronicity    -  Primary   Relevant Orders   Ambulatory referral to Orthopedic Surgery   Mixed hyperlipidemia  Relevant Medications   hydrochlorothiazide (HYDRODIURIL) 25 MG tablet   Other Relevant Orders   Comprehensive metabolic panel (Completed)   Lipid panel (Completed)      I am having Oluwasemilore E. Cutshaw start on  hydrochlorothiazide. I am also having her maintain her aspirin, Multiple Minerals-Vitamins (CALCIUM CITRATE PLUS PO), Vitamin D3, Vitamin B-12, HYDROcodone-homatropine, Choline Fenofibrate, lisinopril, and metoprolol tartrate.  Meds ordered this encounter  Medications  . hydrochlorothiazide (HYDRODIURIL) 25 MG tablet    Sig: Take 1 tablet (25 mg total) by mouth daily.    Dispense:  90 tablet    Refill:  3    CMA served as scribe during this visit. History, Physical and Plan performed by medical provider. Documentation and orders reviewed and attested to.  Danise Edge, MD

## 2017-06-07 NOTE — Assessment & Plan Note (Signed)
Well controlled, no changes to meds. Encouraged heart healthy diet such as the DASH diet and exercise as tolerated.  °

## 2017-06-07 NOTE — Assessment & Plan Note (Signed)
hgba1c acceptable, minimize simple carbs. Increase exercise as tolerated. Continue current meds 

## 2017-06-08 LAB — COMPREHENSIVE METABOLIC PANEL
ALBUMIN: 4.5 g/dL (ref 3.5–5.2)
ALK PHOS: 69 U/L (ref 39–117)
ALT: 14 U/L (ref 0–35)
AST: 16 U/L (ref 0–37)
BUN: 23 mg/dL (ref 6–23)
CO2: 30 mEq/L (ref 19–32)
Calcium: 10.4 mg/dL (ref 8.4–10.5)
Chloride: 106 mEq/L (ref 96–112)
Creatinine, Ser: 0.75 mg/dL (ref 0.40–1.20)
GFR: 80.45 mL/min (ref >60.00)
Glucose, Bld: 114 mg/dL — ABNORMAL HIGH (ref 70–99)
POTASSIUM: 4.4 meq/L (ref 3.5–5.1)
SODIUM: 144 meq/L (ref 135–145)
TOTAL PROTEIN: 7.3 g/dL (ref 6.0–8.3)
Total Bilirubin: 0.3 mg/dL (ref 0.2–1.2)

## 2017-06-08 LAB — LIPID PANEL
CHOLESTEROL: 218 mg/dL — AB (ref 0–200)
HDL: 28.4 mg/dL — AB (ref >39.00)
NonHDL: 189.33
Total CHOL/HDL Ratio: 8
Triglycerides: 323 mg/dL — ABNORMAL HIGH (ref 0.0–149.0)
VLDL: 64.6 mg/dL — AB (ref 0.0–40.0)

## 2017-06-08 LAB — LDL CHOLESTEROL, DIRECT: LDL DIRECT: 144 mg/dL

## 2017-06-12 NOTE — Assessment & Plan Note (Signed)
Patient encouraged to maintain heart healthy diet, regular exercise, adequate sleep. Consider daily probiotics. Take medications as prescribed 

## 2017-06-12 NOTE — Assessment & Plan Note (Signed)
RRR today 

## 2017-06-12 NOTE — Assessment & Plan Note (Signed)
Left knee pain and right shoulder pain worsening, referred to ortho for further consideration. Try topical lidocaine prn.

## 2017-06-12 NOTE — Assessment & Plan Note (Signed)
Encouraged heart healthy diet, increase exercise, avoid trans fats, consider a krill oil cap daily 

## 2017-06-12 NOTE — Assessment & Plan Note (Signed)
Encouraged to get adequate exercise, calcium and vitamin d intake 

## 2017-06-13 ENCOUNTER — Encounter: Payer: Self-pay | Admitting: Family Medicine

## 2017-06-13 ENCOUNTER — Other Ambulatory Visit: Payer: Self-pay | Admitting: Family Medicine

## 2017-06-13 DIAGNOSIS — M25511 Pain in right shoulder: Secondary | ICD-10-CM

## 2017-06-14 NOTE — Addendum Note (Signed)
Addended by: Crissie SicklesARTER, Genasis Zingale A on: 06/14/2017 01:47 PM   Modules accepted: Orders

## 2017-06-27 ENCOUNTER — Other Ambulatory Visit: Payer: Self-pay | Admitting: Family Medicine

## 2017-09-20 IMAGING — CT CT MAXILLOFACIAL W/O CM
3 of 4 series · 16 of 47 positions shown, 19 images · non-contrast
Comparison: None.

CLINICAL DATA: Headache, nasal congestion, epistaxis

EXAM:
CT MAXILLOFACIAL WITHOUT CONTRAST
TECHNIQUE: Multidetector CT imaging of the maxillofacial structures was
performed. Multiplanar CT image reconstructions were also generated.
A small metallic BB was placed on the right temple in order to
reliably differentiate right from left.

[Series 2: max soft · axial · 0.31mm/px · z∈[-196,-54]mm · 11 of 83 slices shown, 14 images]
[im 6/83  brain]
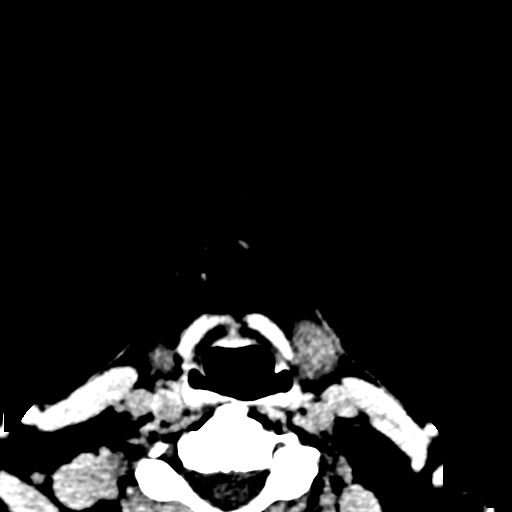
[im 6/83  bone]
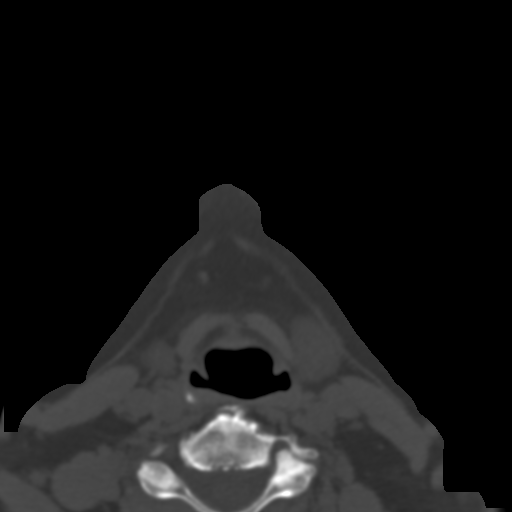
[im 12/83  bone]
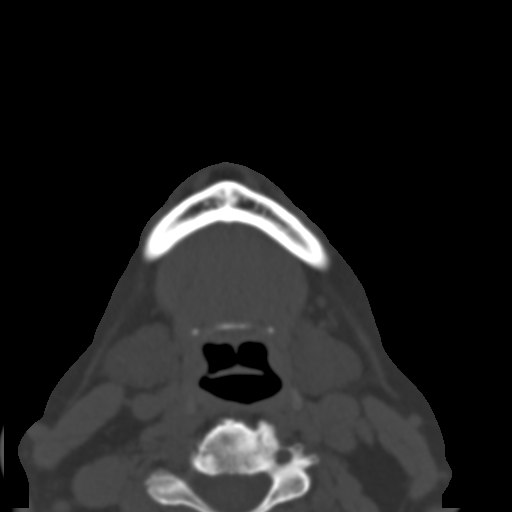
[im 20/83  bone]
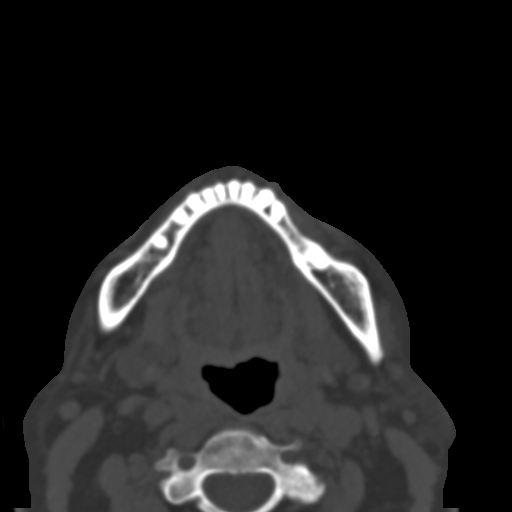
[im 26/83  bone]
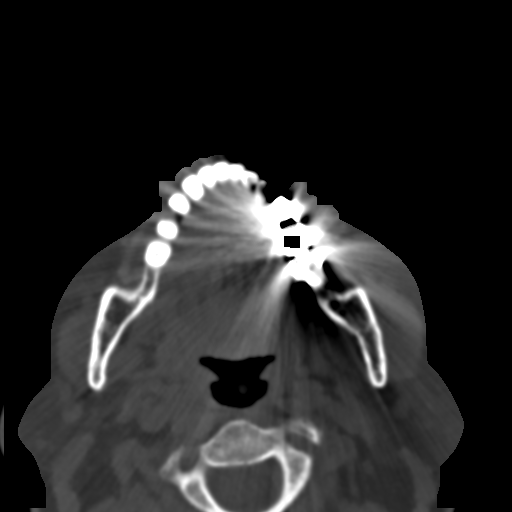
[im 34/83  brain]
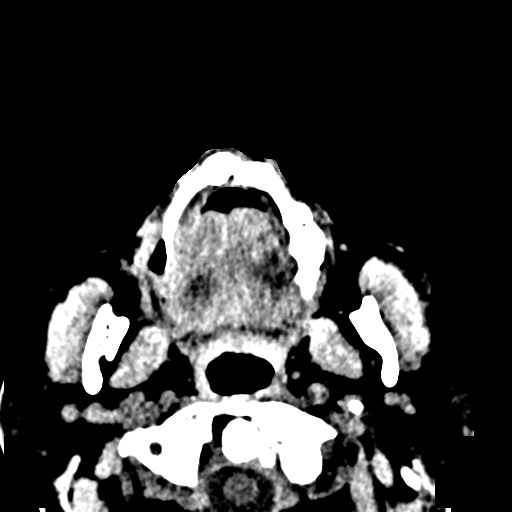
[im 34/83  bone]
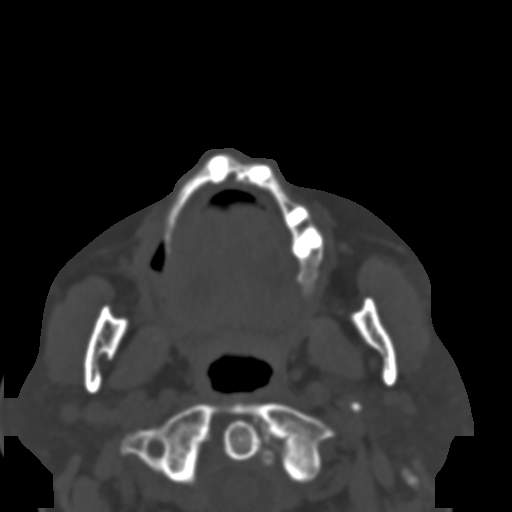
[im 43/83  bone]
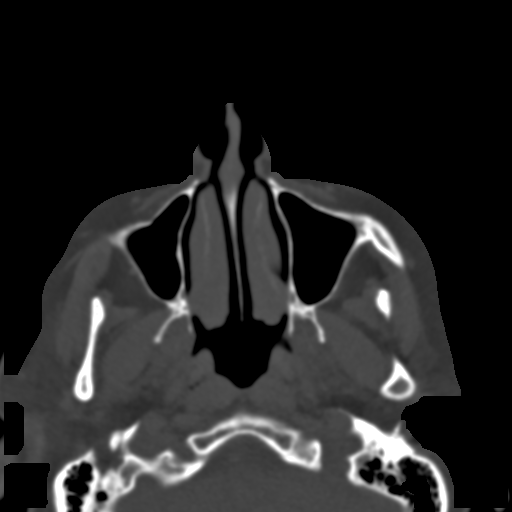
[im 49/83  bone]
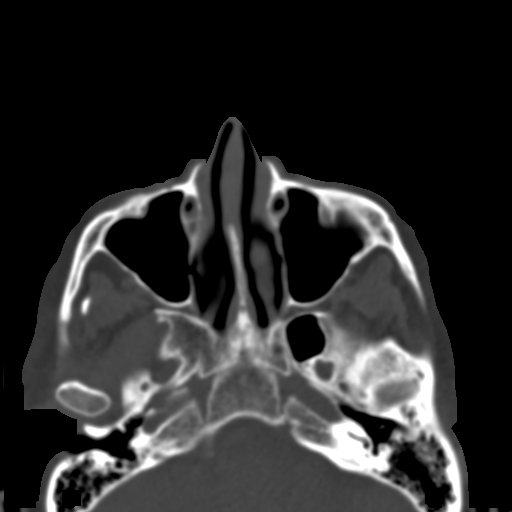
[im 57/83  bone]
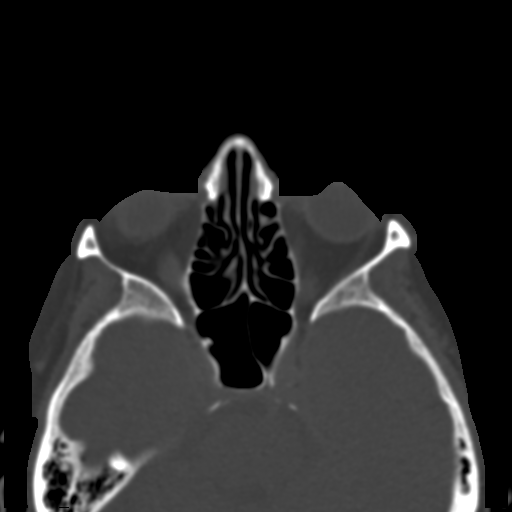
[im 63/83  brain]
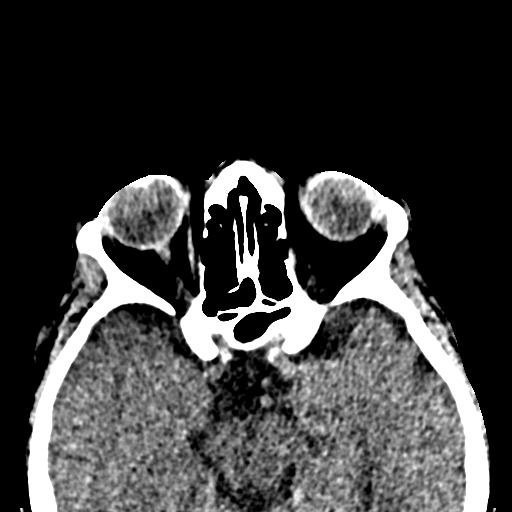
[im 63/83  bone]
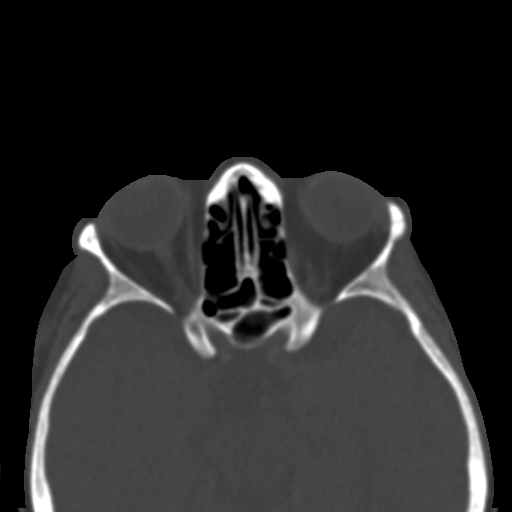
[im 71/83  bone]
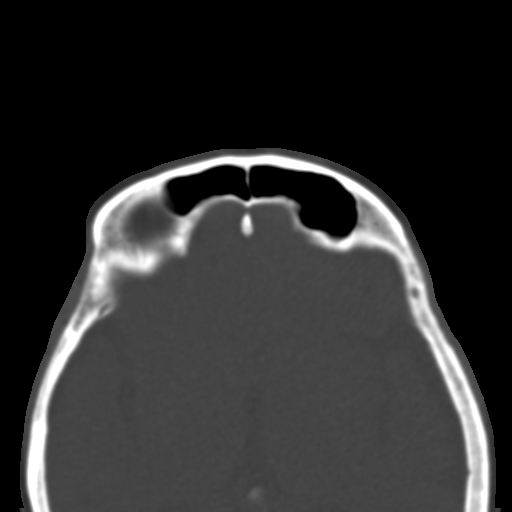
[im 77/83  bone]
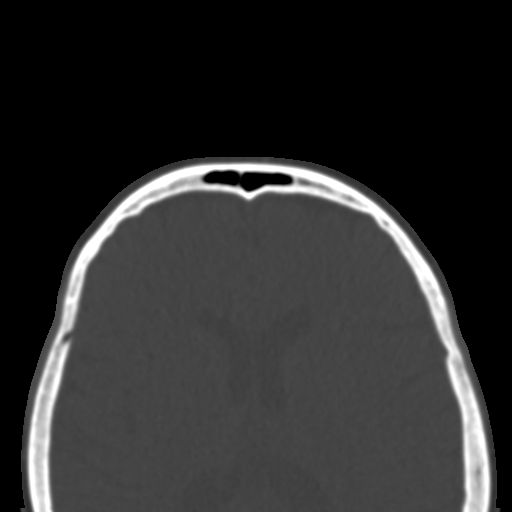

[Series 4: coronal soft · coronal · 0.30mm/px · 3 of 70 slices shown]
[im 24/70  bone]
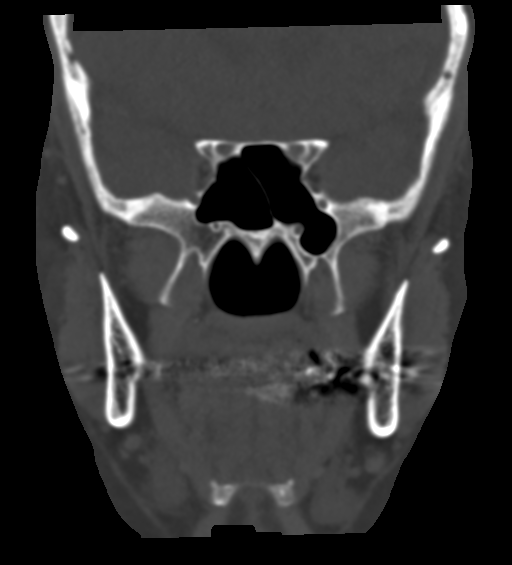
[im 31/70  bone]
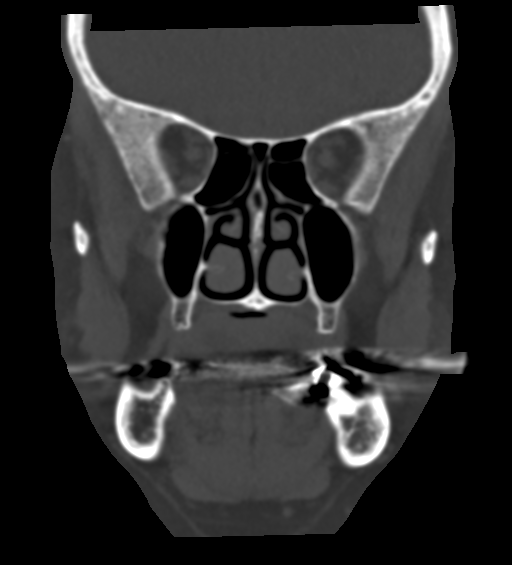
[im 39/70  bone]
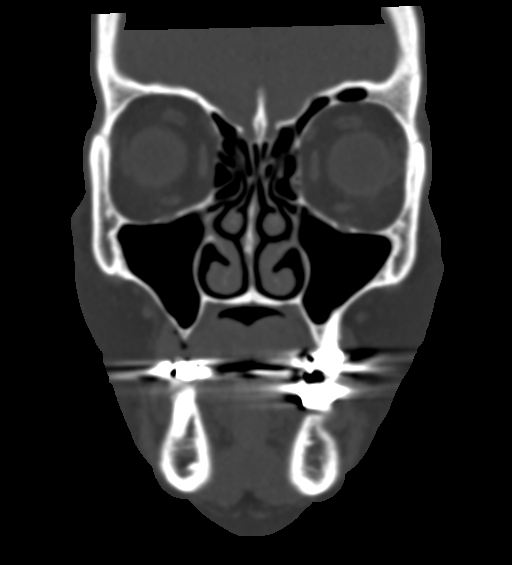

[Series 7: sagittal bone · sagittal · 0.30mm/px · 2 of 85 slices shown]
[im 29/85  bone]
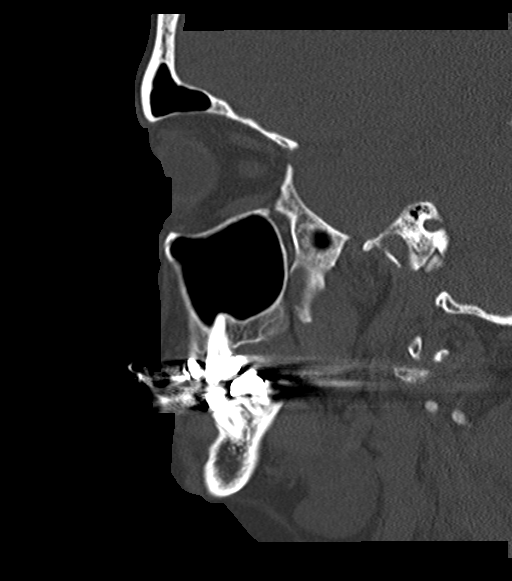
[im 57/85  bone]
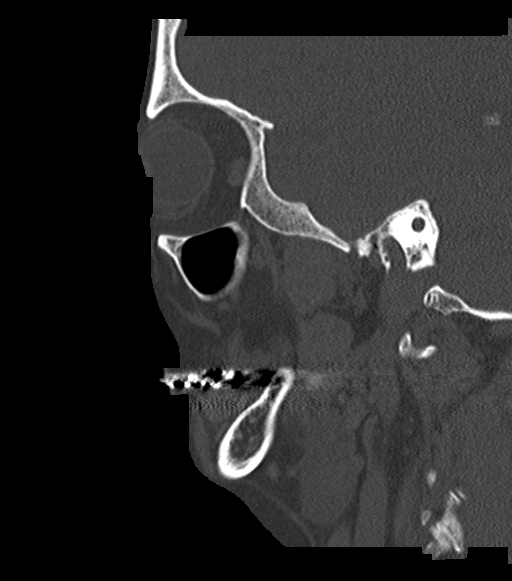

[16 of 47 positions shown; findings below may reference images not displayed]

FINDINGS: Osseous: No fracture or mandibular dislocation. No destructive
process. Degenerative disc disease with disc height loss at C3-4,
C4-5 and C5-6. Bilateral uncovertebral degenerative changes at C5-6
with bilateral foraminal narrowing. Left facet arthropathy at C3-4
and C4-5.

Orbits: Negative. No traumatic or inflammatory finding.

Sinuses: Paranasal sinuses are clear.  Mastoid sinuses are clear.

Soft tissues: No soft tissue abnormality.

Limited intracranial: No significant or unexpected finding.
IMPRESSION: 1. No evidence of sinusitis.

## 2017-09-22 ENCOUNTER — Other Ambulatory Visit: Payer: Self-pay | Admitting: Family Medicine

## 2017-12-28 ENCOUNTER — Other Ambulatory Visit: Payer: Self-pay | Admitting: Family Medicine

## 2018-01-16 LAB — HM MAMMOGRAPHY

## 2018-01-18 ENCOUNTER — Telehealth: Payer: Self-pay

## 2018-01-18 NOTE — Telephone Encounter (Signed)
Please advise 

## 2018-01-18 NOTE — Telephone Encounter (Signed)
Copied from CRM [id number]. Topic: Appointment Scheduling - Scheduling Inquiry for Clinic >> Jan 18, 2018  9:55 AM Leafy Roobinson, Norma J wrote: Reason for CRM: pt will like to switch to dr Swazilandjordan

## 2018-01-19 NOTE — Telephone Encounter (Signed)
OK with me.

## 2018-01-20 ENCOUNTER — Ambulatory Visit: Payer: Medicare Other | Admitting: Medical

## 2018-01-20 NOTE — Telephone Encounter (Signed)
Patient would like to transfer care to Dr. SwazilandJordan. [facility]. Thanks

## 2018-01-20 NOTE — Telephone Encounter (Signed)
Ok with me 

## 2018-01-23 NOTE — Telephone Encounter (Signed)
Appt scheduled 02/03/2018.  

## 2018-01-24 ENCOUNTER — Encounter: Payer: Self-pay | Admitting: Family Medicine

## 2018-02-03 ENCOUNTER — Ambulatory Visit: Payer: Medicare Other | Admitting: Family Medicine

## 2018-02-03 ENCOUNTER — Encounter: Payer: Self-pay | Admitting: Family Medicine

## 2018-02-03 VITALS — BP 120/78 | HR 87 | Temp 98.5°F | Resp 12 | Ht 59.06 in | Wt 123.5 lb

## 2018-02-03 DIAGNOSIS — M25511 Pain in right shoulder: Secondary | ICD-10-CM

## 2018-02-03 DIAGNOSIS — M25512 Pain in left shoulder: Secondary | ICD-10-CM | POA: Diagnosis not present

## 2018-02-03 DIAGNOSIS — E782 Mixed hyperlipidemia: Secondary | ICD-10-CM

## 2018-02-03 DIAGNOSIS — I10 Essential (primary) hypertension: Secondary | ICD-10-CM

## 2018-02-03 DIAGNOSIS — R7303 Prediabetes: Secondary | ICD-10-CM | POA: Diagnosis not present

## 2018-02-03 DIAGNOSIS — G8929 Other chronic pain: Secondary | ICD-10-CM | POA: Diagnosis not present

## 2018-02-03 DIAGNOSIS — R040 Epistaxis: Secondary | ICD-10-CM

## 2018-02-03 DIAGNOSIS — R233 Spontaneous ecchymoses: Secondary | ICD-10-CM | POA: Diagnosis not present

## 2018-02-03 NOTE — Assessment & Plan Note (Signed)
Stable. There is no evidence about benefit with stem cells injections. She is not interested in having surgery. She can try to tumeric with black pepper 1 to 2 capsules daily. Collagen tablets may help, explained that some patients have reported benefits in regard to joint pain but there is not clear evidence. Recommend considering orthopedist recommendations.

## 2018-02-03 NOTE — Progress Notes (Addendum)
HPI:   Eileen Hansen is a 72 y.o. female, who is here today to establish care.  Former PCP: Dr Abner Greenspan Last preventive routine visit: 08/17/2016 (Medicare preventive visit).  Chronic medical problems: Chronic shoulder pain, hypertension, constipation, and allergic rhinitis among some.  Prediabetes: She does not exercise regularly, she follows a healthy diet.  Emily history positive for DM 2. Lab Results  Component Value Date   HGBA1C 6.1 04/11/2017     Hyperlipidemia:  Currently on nonpharmacologic treatment.  She is taking OTC fish oil 3 tabs daily. She never has been on statin medication.  Following a low fat diet: Yes.   Lab Results  Component Value Date   CHOL 218 (H) 06/07/2017   HDL 28.40 (L) 06/07/2017   LDLCALC 95 11/14/2014   LDLDIRECT 144.0 06/07/2017   TRIG 323.0 (H) 06/07/2017   CHOLHDL 8 06/07/2017    Hypertension:    Currently on HCTZ 25 mg daily, lisinopril 10 mg daily, and metoprolol tartrate 25 mg twice daily.  She is taking medications as instructed, no side effects reported. She wonders if she could stop medications.  She has not noted unusual headache, visual changes, exertional chest pain, dyspnea,  focal weakness, or edema.   Lab Results  Component Value Date   CREATININE 0.75 06/07/2017   BUN 23 06/07/2017   NA 144 06/07/2017   K 4.4 06/07/2017   CL 106 06/07/2017   CO2 30 06/07/2017   Lab Results  Component Value Date   HGBA1C 6.1 04/11/2017     Concerns today: She wonders if she is to be taking Aspirin, if she can take OTC magnesium supplementation, data about stem cells in OA, collagen for joint pain, glucosamine for joint pain, and nosebleed.  History of right shoulder pain, she has follow-up with orthopedist and according to patient, surgery was recommended. She is not interested in surgery for now. She wonders if treatment with stem cells injections will help. She also wants to know if glucosamine or collagen  tablets will help with shoulder pain.  No significant limitation of range of motion. Pain is exacerbated by movement and alleviated by rest. She does not take OTC medication regularly.  She is on Aspirin 81 mg daily. She denies any history of CVD. No side effects reported.   Nosebleed: For years she has had intermittent nosebleeds, always from right nares.  Usually he lasts a few seconds It may happen every week every 2 months, there is no pattern. She has not identified exacerbating factors. Usually alleviated by applying pressure. She denies gum bleeding, blood in the stool, or gross hematuria. She has frequent bruises on forearms with mild trauma but has been stable for years. No fever, night sweats, or abnormal weight loss.    Review of Systems  Constitutional: Negative for activity change, appetite change, fatigue and fever.  HENT: Positive for nosebleeds. Negative for mouth sores, sore throat and trouble swallowing.   Eyes: Negative for redness and visual disturbance.  Respiratory: Negative for cough, shortness of breath and wheezing.   Cardiovascular: Negative for chest pain, palpitations and leg swelling.  Gastrointestinal: Negative for abdominal pain, nausea and vomiting.       Negative for changes in bowel habits.  Endocrine: Negative for polydipsia, polyphagia and polyuria.  Genitourinary: Negative for decreased urine volume, dysuria and hematuria.  Musculoskeletal: Positive for arthralgias. Negative for gait problem and joint swelling.  Skin: Negative for rash and wound.  Allergic/Immunologic: Positive for environmental allergies.  Neurological: Negative for syncope, weakness, numbness and headaches.  Hematological: Negative for adenopathy. Does not bruise/bleed easily.  Psychiatric/Behavioral: Negative for confusion. The patient is nervous/anxious.       Current Outpatient Medications on File Prior to Visit  Medication Sig Dispense Refill  . aspirin 81 MG  tablet Take 81 mg by mouth daily.    . Cholecalciferol (VITAMIN D3) 2000 units TABS Take 1 tablet by mouth daily.    . COLLAGEN PO Take by mouth daily.    . Cyanocobalamin (VITAMIN B-12) 5000 MCG TBDP Take 1 tablet by mouth daily.    . Glucosamine-Chondroit-Vit C-Mn (GLUCOSAMINE-CHONDROITIN) TABS Take by mouth daily.    . hydrochlorothiazide (HYDRODIURIL) 25 MG tablet Take 1 tablet (25 mg total) by mouth daily. 90 tablet 3  . lisinopril (PRINIVIL,ZESTRIL) 10 MG tablet TAKE TWO TABLETS BY MOUTH DAILY  180 tablet 0  . metoprolol tartrate (LOPRESSOR) 25 MG tablet TAKE HALF TABLET BY MOUTH TWICE DAILY  90 tablet 0  . Multiple Minerals-Vitamins (CALCIUM CITRATE PLUS PO) Take 2 tablets by mouth daily.    . Multiple Vitamin (MULTI-VITAMINS) TABS Take by mouth.    . Omega-3 Fatty Acids (FISH OIL) 1000 MG CAPS Take 1,000 capsules by mouth daily.     No current facility-administered medications on file prior to visit.      Past Medical History:  Diagnosis Date  . Allergic rhinitis   . Arthralgia 09/30/2015  . Bronchitis, acute 01/09/2017  . Chronic shoulder pain 12/07/2016  . Epistaxis 09/30/2015  . Globus sensation 02/23/2015  . Heart murmur 12/07/2016  . HTN (hypertension)   . Left hip pain 09/30/2015  . Medicare annual wellness visit, subsequent 04/14/2015  . Pain in the wrist, unspecified laterality 03/03/2014  . Tachycardia    Episodic   Allergies  Allergen Reactions  . Fenofibrate Other (See Comments)    Abdominal pain  . Penicillins Rash    REACTION: Hives Anaphylaxis    Family History  Problem Relation Age of Onset  . Cancer Father        melanoma  . Diabetes Mother   . Hypertension Mother   . Hyperlipidemia Mother   . Stroke Mother   . Heart attack Mother   . Heart attack Brother   . Cancer Brother        pancreatic  . Colon cancer Neg Hx   . Breast cancer Neg Hx   . Coronary artery disease Neg Hx     Social History   Socioeconomic History  . Marital status: Married      Spouse name: Not on file  . Number of children: 1  . Years of education: Not on file  . Highest education level: Not on file  Occupational History  . Occupation: Dentist: RETIRED  Social Needs  . Financial resource strain: Not on file  . Food insecurity:    Worry: Not on file    Inability: Not on file  . Transportation needs:    Medical: Not on file    Non-medical: Not on file  Tobacco Use  . Smoking status: Never Smoker  . Smokeless tobacco: Never Used  Substance and Sexual Activity  . Alcohol use: Yes    Comment: wine  . Drug use: No  . Sexual activity: Yes    Comment: lives with husband  Lifestyle  . Physical activity:    Days per week: Not on file    Minutes per session: Not on file  . Stress:  Not on file  Relationships  . Social connections:    Talks on phone: Not on file    Gets together: Not on file    Attends religious service: Not on file    Active member of club or organization: Not on file    Attends meetings of clubs or organizations: Not on file    Relationship status: Not on file  Other Topics Concern  . 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   Physical Exam  Nursing note and vitals reviewed. Constitutional: She is oriented to person, place, and time. She appears well-developed and well-nourished. No distress.  HENT:  Head: Normocephalic and atraumatic.  Nose: Septal deviation present. No rhinorrhea or nasal septal hematoma.  Mouth/Throat: Oropharynx is clear and moist and mucous membranes are normal.  Hyperemic nasal mucosa right nare > then left.  Eyes: Pupils are equal, round, and reactive to  light. Conjunctivae are normal.  Cardiovascular: Normal rate and regular rhythm.  Murmur (Soft SEM RUSB) heard. Pulses:      Dorsalis pedis pulses are 2+ on the right side, and 2+ on the left side.  Respiratory: Effort normal and breath sounds normal. No respiratory distress.  GI: Soft. She exhibits no mass. There is no hepatomegaly. There is no tenderness.  Musculoskeletal: She exhibits no edema.  Tenderness of right shoulder with movement, mild limitation of abduction. No significant deformity, no edema or erythema appreciated.Mild muscle atrophy. Eileen Hansen' test positive, drop arm rotator cuff test positive, empty can supraspinatus test positive, cross body adduction test negative  Lymphadenopathy:    She has no cervical adenopathy.  Neurological: She is alert and oriented to person, place, and time. She has normal strength. Gait normal.  Skin: Skin is warm. No ecchymosis and no rash noted. No erythema.  Psychiatric: She has a normal mood and affect.  Well groomed, good eye contact.      ASSESSMENT AND PLAN:   Ms. Eileen Hansen was seen today for transfer of care.  Diagnoses and all orders for this visit:  Lab Results  Component Value Date   CHOL 195 02/13/2018   HDL 32.50 (L) 02/13/2018   LDLCALC 125 (H) 02/13/2018   LDLDIRECT 144.0 06/07/2017   TRIG 189.0 (H) 02/13/2018   CHOLHDL 6 02/13/2018   Lab Results  Component Value Date   CREATININE 0.70 02/13/2018   BUN 19 02/13/2018   NA 141 02/13/2018   K 4.1 02/13/2018   CL 103 02/13/2018   CO2 28 02/13/2018    Essential hypertension  BP adequately controlled. We discussed possible complications of poorly controlled hypertension,so recommend continuing current management. She can monitor BP at home and if persistently BP <100/60 we can consider adjusting medication. Continue low-salt diet and annual eye exams. Follow-up in 6 months.  -     Basic metabolic panel; Future  Hyperlipidemia, mixed  She would like to hold on  starting medication for now, we discussed benefits. Continue low-fat diet and OTC fish oil. She will come in 1 to 2 weeks for fasting  labs.  The 10-year ASCVD risk score Denman George(Goff DC Montez HagemanJr., et al., 2013) is: 15.7%   Values used to calculate the score:     Age: [age] years     Sex: Female     Is Non-Hispanic African American: No     Diabetic: No     Tobacco smoker: No     Systolic Blood Pressure: 120 mmHg     Is BP treated: Yes     HDL Cholesterol: 32.5 mg/dL     Total Cholesterol: 195 mg/dL  -     Lipid panel; Future  Prediabetes  Healthy lifestyle for primary prevention. Further recommendation will be given according to A1c results.  -     Hemoglobin A1c; Future  Chronic pain of both shoulders  She has already followed up with orthopedist, who recommended surgery. OTC IcyHot or Aspercreme with lidocaine may help.  Range of motion exercises to prevent frozen shoulder. Follow-up as needed.  Epistaxis  We discussed possible etiologies, most likely related to allergies rhinitis. This is chronic and reported as a stable, so I do not think this is related with a serious systemic condition. I called her ENT evaluation but she is satisfied with discussion on prefers to hold on referral. Instructed about warning signs.  Senile ecchymosis  No forearm ecchymosis at this time. Educated about diagnosis. Reassured.    40 min face to face OV. > 50% was dedicated to discussion of differential diagnosis , prognosis, treatment options, and some side effects of medications.  We discussed general data in regard to recommendations for OA.  Glucosamine will not help with shoulder pain, might help with knee pain.  At this time there is no strong evidence about benefits from stem cells injections.  In regard to magnesium supplementation, I am not aware of current recommendations but she can take OTC magnesium oxide 250 mg daily. Since she does not have a history of CVD, she can discontinue  Aspirin 81 mg. She will come back in 2 weeks for fasting labs.    Niam Nepomuceno G. Sw[place]Jordan, MD  [facility]. [place] office.

## 2018-02-03 NOTE — Assessment & Plan Note (Signed)
We discussed possible causes. She has history of allergy rhinitis, which could aggravate problem. CBC has been normal in the past. It seems to be chronic and stable with no associated worrisome symptoms. We discussed options at this time, including ENT referral.  She decides to hold on further evaluation for now.

## 2018-02-03 NOTE — Assessment & Plan Note (Signed)
For now she would like to continue taking fish oil, she is not interested in starting medications for now. We discussed some possible complications of hyperlipidemia and some side effects of fish oil as well as current recommendations. She will come back in 2 weeks for fasting labs and we we will give recommendations accordingly. Continue low-fat diet.

## 2018-02-03 NOTE — Assessment & Plan Note (Signed)
Recommend continuing healthy lifestyle for primary prevention. Further recommendation will be given according to A1c results.

## 2018-02-03 NOTE — Assessment & Plan Note (Signed)
Educated about diagnosis. Reassured.

## 2018-02-03 NOTE — Patient Instructions (Addendum)
A few things to remember from today's visit:   Essential hypertension - Plan: Basic metabolic panel  Hyperlipidemia, mixed - Plan: Lipid panel  Prediabetes - Plan: Hemoglobin A1c  Chronic pain of both shoulders  Steam cells ,no clears benefit. Tumeric with black pepper. Fish oil to continue but can increase LDL.  Nasal bleding.  Please be sure medication list is accurate. If a new problem present, please set up appointment sooner than planned today.

## 2018-02-03 NOTE — Assessment & Plan Note (Signed)
Adequately controlled. We discussed some possible complications of poorly controlled hypertension, for now recommend continue current management. Continue monitoring BP at home.  Continue low-salt diet. Eye exam recommended annually. F/U in 6 months, before if needed. .Marland Kitchen

## 2018-02-13 LAB — BASIC METABOLIC PANEL
BUN: 19 mg/dL (ref 6–23)
CALCIUM: 10 mg/dL (ref 8.4–10.5)
CHLORIDE: 103 meq/L (ref 96–112)
CO2: 28 mEq/L (ref 19–32)
Creatinine, Ser: 0.7 mg/dL (ref 0.40–1.20)
GFR: 86.95 mL/min (ref >60.00)
Glucose, Bld: 126 mg/dL — ABNORMAL HIGH (ref 70–99)
Potassium: 4.1 mEq/L (ref 3.5–5.1)
Sodium: 141 mEq/L (ref 135–145)

## 2018-02-13 LAB — LIPID PANEL
Cholesterol: 195 mg/dL (ref 0–200)
HDL: 32.5 mg/dL — ABNORMAL LOW (ref >39.00)
LDL CALC: 125 mg/dL — AB (ref 0–99)
NonHDL: 162.96
Total CHOL/HDL Ratio: 6
Triglycerides: 189 mg/dL — ABNORMAL HIGH (ref 0.0–149.0)
VLDL: 37.8 mg/dL (ref 0.0–40.0)

## 2018-02-13 LAB — HEMOGLOBIN A1C: Hgb A1c MFr Bld: 6 % (ref 4.6–6.5)

## 2018-02-13 NOTE — Addendum Note (Signed)
Addended by: Laure KidneyLARK, Bonnell Placzek J on: 02/13/2018 09:09 AM   Modules accepted: Orders

## 2018-02-15 ENCOUNTER — Other Ambulatory Visit: Payer: Self-pay | Admitting: *Deleted

## 2018-02-15 ENCOUNTER — Encounter: Payer: Self-pay | Admitting: Family Medicine

## 2018-02-15 MED ORDER — PRAVASTATIN SODIUM 20 MG PO TABS: 20.0000 mg | ORAL_TABLET | Freq: Every day | ORAL | 3 refills | Status: DC

## 2018-03-22 ENCOUNTER — Other Ambulatory Visit: Payer: Self-pay | Admitting: Family Medicine

## 2018-03-23 NOTE — Telephone Encounter (Signed)
Please advise 

## 2018-04-12 ENCOUNTER — Other Ambulatory Visit: Payer: Self-pay | Admitting: Family Medicine

## 2018-05-24 ENCOUNTER — Other Ambulatory Visit: Payer: Self-pay | Admitting: Family Medicine

## 2018-05-24 DIAGNOSIS — I10 Essential (primary) hypertension: Secondary | ICD-10-CM

## 2018-07-18 ENCOUNTER — Ambulatory Visit: Payer: Medicare Other | Admitting: Family Medicine

## 2018-07-18 ENCOUNTER — Ambulatory Visit (HOSPITAL_BASED_OUTPATIENT_CLINIC_OR_DEPARTMENT_OTHER)
Admission: RE | Admit: 2018-07-18 | Discharge: 2018-07-18 | Disposition: A | Discharge status: Home / Self Care | Payer: Medicare Other | Source: Ambulatory Visit | Attending: Family Medicine | Admitting: Family Medicine

## 2018-07-18 VITALS — BP 122/68 | HR 78 | Temp 98.2°F | Resp 18 | Wt 122.8 lb

## 2018-07-18 DIAGNOSIS — J101 Influenza due to other identified influenza virus with other respiratory manifestations: Secondary | ICD-10-CM | POA: Diagnosis not present

## 2018-07-18 DIAGNOSIS — I10 Essential (primary) hypertension: Secondary | ICD-10-CM

## 2018-07-18 DIAGNOSIS — R7303 Prediabetes: Secondary | ICD-10-CM

## 2018-07-18 DIAGNOSIS — R1011 Right upper quadrant pain: Secondary | ICD-10-CM

## 2018-07-18 LAB — COMPREHENSIVE METABOLIC PANEL
ALBUMIN: 4.3 g/dL (ref 3.5–5.2)
ALT: 14 U/L (ref 0–35)
AST: 23 U/L (ref 0–37)
Alkaline Phosphatase: 45 U/L (ref 39–117)
BUN: 18 mg/dL (ref 6–23)
CO2: 27 mEq/L (ref 19–32)
Calcium: 9.1 mg/dL (ref 8.4–10.5)
Chloride: 100 mEq/L (ref 96–112)
Creatinine, Ser: 0.71 mg/dL (ref 0.40–1.20)
GFR: 85.44 mL/min (ref >60.00)
Glucose, Bld: 97 mg/dL (ref 70–99)
Potassium: 3.7 mEq/L (ref 3.5–5.1)
Sodium: 136 mEq/L (ref 135–145)
Total Bilirubin: 0.5 mg/dL (ref 0.2–1.2)
Total Protein: 6.7 g/dL (ref 6.0–8.3)

## 2018-07-18 LAB — CBC WITH DIFFERENTIAL/PLATELET
Basophils Absolute: 0 10*3/uL (ref 0.0–0.1)
Basophils Relative: 0.6 % (ref 0.0–3.0)
Eosinophils Absolute: 0.1 10*3/uL (ref 0.0–0.7)
Eosinophils Relative: 1.5 % (ref 0.0–5.0)
HCT: 38.6 % (ref 36.0–46.0)
Hemoglobin: 13.5 g/dL (ref 12.0–15.0)
Lymphocytes Relative: 25.1 % (ref 12.0–46.0)
Lymphs Abs: 1.2 10*3/uL (ref 0.7–4.0)
MCHC: 35.1 g/dL (ref 30.0–36.0)
MCV: 88.3 fl (ref 78.0–100.0)
Monocytes Absolute: 0.8 10*3/uL (ref 0.1–1.0)
Monocytes Relative: 18 % — ABNORMAL HIGH (ref 3.0–12.0)
Neutro Abs: 2.6 10*3/uL (ref 1.4–7.7)
Neutrophils Relative %: 54.8 % (ref 43.0–77.0)
Platelets: 220 10*3/uL (ref 150.0–400.0)
RBC: 4.37 Mil/uL (ref 3.87–5.11)
RDW: 12.5 % (ref 11.5–15.5)
WBC: 4.7 10*3/uL (ref 4.0–10.5)

## 2018-07-18 LAB — SEDIMENTATION RATE: Sed Rate: 5 mm/hr (ref 0–30)

## 2018-07-18 MED ORDER — ALBUTEROL SULFATE HFA 108 (90 BASE) MCG/ACT IN AERS: 2.0000 | INHALATION_SPRAY | Freq: Four times a day (QID) | RESPIRATORY_TRACT | 0 refills | Status: DC | PRN

## 2018-07-18 MED ORDER — OSELTAMIVIR PHOSPHATE 75 MG PO CAPS: 75.0000 mg | ORAL_CAPSULE | Freq: Two times a day (BID) | ORAL | 0 refills | Status: DC

## 2018-07-18 NOTE — Assessment & Plan Note (Signed)
Positive in office today. Started on tamiflu. Encouraged increased rest and hydration, add probiotics, zinc such as Coldeze or Xicam. Treat fevers as needed

## 2018-07-18 NOTE — Progress Notes (Signed)
23u

## 2018-07-18 NOTE — Patient Instructions (Signed)

## 2018-07-20 DIAGNOSIS — R1011 Right upper quadrant pain: Secondary | ICD-10-CM | POA: Insufficient documentation

## 2018-07-20 NOTE — Assessment & Plan Note (Signed)
Xray shows constipation, Encouraged increased hydration and fiber in diet. Daily probiotics. If bowels not moving can use MOM 2 tbls po in 4 oz of warm prune juice by mouth every 2-3 days. If no results then repeat in 4 hours with  Dulcolax suppository pr, may repeat again in 4 more hours as needed. Seek care if symptoms worsen. Consider daily Miralax and/or Dulcolax if symptoms persist.

## 2018-07-20 NOTE — Progress Notes (Signed)
Subjective:    Patient ID: Eileen Hansen, female    DOB: [birth year], 73 y.o.   MRN: [id number]  No chief complaint on file.   HPI Patient is in today for evaluation of acute illness. She has been feeling poorly for 2-3 days and endorses malaise, myalgias, fevers, chills, body aches, cough, congestion, SOB. She also notes a longer history of intermittent right upper quadrant pain this year. Is flared a little now. She denies any changein bowel habits and no bloody or tarry stool. Denies CP/palp/SOB/HA/congestion/fevers or GU c/o. Taking meds as prescribed  Past Medical History:  Diagnosis Date  . Allergic rhinitis   . Arthralgia 09/30/2015  . Bronchitis, acute 01/09/2017  . Chronic shoulder pain 12/07/2016  . Epistaxis 09/30/2015  . Globus sensation 02/23/2015  . Heart murmur 12/07/2016  . HTN (hypertension)   . Left hip pain 09/30/2015  . Medicare annual wellness visit, subsequent 04/14/2015  . Pain in the wrist, unspecified laterality 03/03/2014  . Tachycardia    Episodic    Past Surgical History:  Procedure Laterality Date  . APPENDECTOMY  73 yrs old  . BUNIONECTOMY    . WISDOM TOOTH EXTRACTION  73 yrs old    Family History  Problem Relation Age of Onset  . Cancer Father        melanoma  . Diabetes Mother   . Hypertension Mother   . Hyperlipidemia Mother   . Stroke Mother   . Heart attack Mother   . Heart attack Brother   . Cancer Brother        pancreatic  . Colon cancer Neg Hx   . Breast cancer Neg Hx   . Coronary artery disease Neg Hx     Social History   Socioeconomic History  . Marital status: Married    Spouse name: Not on file  . Number of children: 1  . Years of education: Not on file  . Highest education level: Not on file  Occupational History  . Occupation: Dentist: RETIRED  Social Needs  . Financial resource strain: Not on file  . Food insecurity:    Worry: Not on file    Inability: Not on file  . Transportation needs:    Medical:  Not on file    Non-medical: Not on file  Tobacco Use  . Smoking status: Never Smoker  . Smokeless tobacco: Never Used  Substance and Sexual Activity  . Alcohol use: Yes    Comment: wine  . Drug use: No  . Sexual activity: Yes    Comment: lives with husband  Lifestyle  . Physical activity:    Days per week: Not on file    Minutes per session: Not on file  . Stress: Not on file  Relationships  . Social connections:    Talks on phone: Not on file    Gets together: Not on file    Attends religious service: Not on file    Active member of club or organization: Not on file    Attends meetings of clubs or organizations: Not on file    Relationship status: Not on file  . Intimate partner violence:    Fear of current or ex partner: Not on file    Emotionally abused: Not on file    Physically abused: Not on file    Forced sexual activity: Not on file  Other Topics Concern  . Not on file  Social History Narrative   University - [place],  Masters Degree - counseling, [employer]   Married '69 - 7 years, divorced; married '82   21 daughter - '72; 2 grandchildren      Regular Exercise -  YES          Outpatient Medications Prior to Visit  Medication Sig Dispense Refill  . aspirin 81 MG tablet Take 81 mg by mouth daily.    . Cholecalciferol (VITAMIN D3) 2000 units TABS Take 1 tablet by mouth daily.    . COLLAGEN PO Take by mouth daily.    . Cyanocobalamin (VITAMIN B-12) 5000 MCG TBDP Take 1 tablet by mouth daily.    . Glucosamine-Chondroit-Vit C-Mn (GLUCOSAMINE-CHONDROITIN) TABS Take by mouth daily.    . hydrochlorothiazide (HYDRODIURIL) 25 MG tablet TAKE ONE TABLET BY MOUTH ONE TIME DAILY  90 tablet 0  . lisinopril (PRINIVIL,ZESTRIL) 10 MG tablet TAKE TWO TABLETS BY MOUTH DAILY  180 tablet 1  . metoprolol tartrate (LOPRESSOR) 25 MG tablet TAKE HALF TABLET BY MOUTH TWICE DAILY  90 tablet 1  . Multiple Minerals-Vitamins (CALCIUM CITRATE PLUS PO) Take 2 tablets by mouth  daily.    . Multiple Vitamin (MULTI-VITAMINS) TABS Take by mouth.    . Omega-3 Fatty Acids (FISH OIL) 1000 MG CAPS Take 1,000 capsules by mouth daily.    . pravastatin (PRAVACHOL) 20 MG tablet Take 1 tablet (20 mg total) by mouth daily. 90 tablet 3   No facility-administered medications prior to visit.     Allergies  Allergen Reactions  . Fenofibrate Other (See Comments)    Abdominal pain  . Penicillins Rash    REACTION: Hives Anaphylaxis    Review of Systems  Constitutional: Positive for chills, fever and malaise/fatigue.  HENT: Positive for congestion.   Eyes: Negative for blurred vision.  Respiratory: Positive for cough, sputum production and shortness of breath.   Cardiovascular: Negative for chest pain, palpitations and leg swelling.  Gastrointestinal: Negative for abdominal pain, blood in stool and nausea.  Genitourinary: Negative for dysuria and frequency.  Musculoskeletal: Positive for myalgias. Negative for falls.  Skin: Negative for rash.  Neurological: Positive for dizziness and headaches. Negative for loss of consciousness.  Endo/Heme/Allergies: Negative for environmental allergies.  Psychiatric/Behavioral: Negative for depression. The patient is not nervous/anxious.        Objective:    Physical Exam Vitals signs and nursing note reviewed.  Constitutional:      General: She is not in acute distress.    Appearance: She is well-developed.  HENT:     Head: Normocephalic and atraumatic.     Nose: Nose normal.     Mouth/Throat:     Mouth: Mucous membranes are dry.     Pharynx: Posterior oropharyngeal erythema present.  Eyes:     General:        Right eye: No discharge.        Left eye: No discharge.  Neck:     Musculoskeletal: Normal range of motion and neck supple.  Cardiovascular:     Rate and Rhythm: Normal rate and regular rhythm.     Heart sounds: No murmur.  Pulmonary:     Effort: Pulmonary effort is normal.     Breath sounds: Normal breath  sounds.  Abdominal:     General: Bowel sounds are normal.     Palpations: Abdomen is soft.     Tenderness: There is no abdominal tenderness.  Skin:    General: Skin is warm and dry.  Neurological:     Mental Status:  She is alert and oriented to person, place, and time.     BP 122/68 (BP Location: Left Arm, Patient Position: Sitting, Cuff Size: Normal)   Pulse 78   Temp 98.2 F (36.8 C) (Oral)   Resp 18   Wt 122 lb 12.8 oz (55.7 kg)   SpO2 97%   BMI 24.75 kg/m  Wt Readings from Last 3 Encounters:  07/18/18 122 lb 12.8 oz (55.7 kg)  02/03/18 123 lb 8 oz (56 kg)  06/07/17 123 lb 9.6 oz (56.1 kg)     Lab Results  Component Value Date   WBC 4.7 07/18/2018   HGB 13.5 07/18/2018   HCT 38.6 07/18/2018   PLT 220.0 07/18/2018   GLUCOSE 97 07/18/2018   CHOL 195 02/13/2018   TRIG 189.0 (H) 02/13/2018   HDL 32.50 (L) 02/13/2018   LDLDIRECT 144.0 06/07/2017   LDLCALC 125 (H) 02/13/2018   ALT 14 07/18/2018   AST 23 07/18/2018   NA 136 07/18/2018   K 3.7 07/18/2018   CL 100 07/18/2018   CREATININE 0.71 07/18/2018   BUN 18 07/18/2018   CO2 27 07/18/2018   TSH 1.67 04/11/2017   HGBA1C 6.0 02/13/2018   MICROALBUR <0.7 04/02/2016    Lab Results  Component Value Date   TSH 1.67 04/11/2017   Lab Results  Component Value Date   WBC 4.7 07/18/2018   HGB 13.5 07/18/2018   HCT 38.6 07/18/2018   MCV 88.3 07/18/2018   PLT 220.0 07/18/2018   Lab Results  Component Value Date   NA 136 07/18/2018   K 3.7 07/18/2018   CO2 27 07/18/2018   GLUCOSE 97 07/18/2018   BUN 18 07/18/2018   CREATININE 0.71 07/18/2018   BILITOT 0.5 07/18/2018   ALKPHOS 45 07/18/2018   AST 23 07/18/2018   ALT 14 07/18/2018   PROT 6.7 07/18/2018   ALBUMIN 4.3 07/18/2018   CALCIUM 9.1 07/18/2018   ANIONGAP 10 10/17/2014   GFR 85.44 07/18/2018   Lab Results  Component Value Date   CHOL 195 02/13/2018   Lab Results  Component Value Date   HDL 32.50 (L) 02/13/2018   Lab Results  Component  Value Date   LDLCALC 125 (H) 02/13/2018   Lab Results  Component Value Date   TRIG 189.0 (H) 02/13/2018   Lab Results  Component Value Date   CHOLHDL 6 02/13/2018   Lab Results  Component Value Date   HGBA1C 6.0 02/13/2018       Assessment & Plan:   Problem List Items Addressed This Visit    Essential hypertension    Well controlled, no changes to meds. Encouraged heart healthy diet such as the DASH diet and exercise as tolerated.       Prediabetes    hgba1c acceptable, minimize simple carbs. Increase exercise as tolerated.      Influenza A    Positive in office today. Started on tamiflu. Encouraged increased rest and hydration, add probiotics, zinc such as Coldeze or Xicam. Treat fevers as needed      Relevant Medications   oseltamivir (TAMIFLU) 75 MG capsule   Other Relevant Orders   CBC with Differential/Platelet (Completed)   RUQ pain - Primary    Xray shows constipation, Encouraged increased hydration and fiber in diet. Daily probiotics. If bowels not moving can use MOM 2 tbls po in 4 oz of warm prune juice by mouth every 2-3 days. If no results then repeat in 4 hours with  Dulcolax suppository pr, may  repeat again in 4 more hours as needed. Seek care if symptoms worsen. Consider daily Miralax and/or Dulcolax if symptoms persist.       Relevant Orders   DG Abd 2 Views (Completed)   CBC with Differential/Platelet (Completed)   Comprehensive metabolic panel (Completed)   Sedimentation rate (Completed)      I am having Eileen Hansen start on oseltamivir and albuterol. I am also having her maintain her aspirin, Multiple Minerals-Vitamins (CALCIUM CITRATE PLUS PO), Vitamin D3, Vitamin B-12, MULTI-VITAMINS, Fish Oil, COLLAGEN PO, Glucosamine-Chondroitin, pravastatin, metoprolol tartrate, lisinopril, and hydrochlorothiazide.  Meds ordered this encounter  Medications  . oseltamivir (TAMIFLU) 75 MG capsule    Sig: Take 1 capsule (75 mg total) by mouth 2 (two) times  daily.    Dispense:  10 capsule    Refill:  0  . albuterol (PROVENTIL HFA;VENTOLIN HFA) 108 (90 Base) MCG/ACT inhaler    Sig: Inhale 2 puffs into the lungs every 6 (six) hours as needed for wheezing or shortness of breath.    Dispense:  1 Inhaler    Refill:  0     Danise Edge, MD

## 2018-07-20 NOTE — Assessment & Plan Note (Signed)
hgba1c acceptable, minimize simple carbs. Increase exercise as tolerated.  

## 2018-07-20 NOTE — Assessment & Plan Note (Signed)
Well controlled, no changes to meds. Encouraged heart healthy diet such as the DASH diet and exercise as tolerated.  °

## 2018-07-23 ENCOUNTER — Other Ambulatory Visit: Payer: Self-pay | Admitting: Family Medicine

## 2018-07-23 DIAGNOSIS — I10 Essential (primary) hypertension: Secondary | ICD-10-CM

## 2018-07-25 NOTE — Telephone Encounter (Signed)
Please advise 

## 2018-09-21 ENCOUNTER — Other Ambulatory Visit: Payer: Self-pay | Admitting: Family Medicine

## 2018-10-28 ENCOUNTER — Encounter: Payer: Self-pay | Admitting: Family Medicine

## 2018-10-31 ENCOUNTER — Other Ambulatory Visit: Payer: Self-pay | Admitting: *Deleted

## 2018-10-31 MED ORDER — LISINOPRIL 10 MG PO TABS: 20.0000 mg | ORAL_TABLET | Freq: Every day | ORAL | 1 refills | Status: DC

## 2018-11-03 ENCOUNTER — Other Ambulatory Visit: Payer: Self-pay | Admitting: Family Medicine

## 2018-12-18 ENCOUNTER — Other Ambulatory Visit: Payer: Self-pay | Admitting: Family Medicine

## 2019-01-04 ENCOUNTER — Telehealth: Payer: Self-pay | Admitting: *Deleted

## 2019-01-04 ENCOUNTER — Other Ambulatory Visit: Payer: Self-pay

## 2019-01-04 ENCOUNTER — Ambulatory Visit (INDEPENDENT_AMBULATORY_CARE_PROVIDER_SITE_OTHER): Payer: Medicare Other | Admitting: Family Medicine

## 2019-01-04 ENCOUNTER — Encounter: Payer: Self-pay | Admitting: Family Medicine

## 2019-01-04 ENCOUNTER — Telehealth: Payer: Self-pay | Admitting: Family Medicine

## 2019-01-04 VITALS — Temp 98.4°F

## 2019-01-04 DIAGNOSIS — Z20822 Contact with and (suspected) exposure to covid-19: Secondary | ICD-10-CM

## 2019-01-04 DIAGNOSIS — R059 Cough, unspecified: Secondary | ICD-10-CM

## 2019-01-04 DIAGNOSIS — J040 Acute laryngitis: Secondary | ICD-10-CM

## 2019-01-04 DIAGNOSIS — R05 Cough: Secondary | ICD-10-CM | POA: Diagnosis not present

## 2019-01-04 DIAGNOSIS — J029 Acute pharyngitis, unspecified: Secondary | ICD-10-CM | POA: Diagnosis not present

## 2019-01-04 NOTE — Telephone Encounter (Signed)
Community message sent to the [facility] for COVID testing and the pt is aware someone will call with appt info.  Appt scheduled for Monday at 11:30am.

## 2019-01-04 NOTE — Progress Notes (Signed)
Virtual Visit via Telephone Note  I connected with Eileen Hansen on 01/04/19 at  3:00 PM EDT by telephone and verified that I am speaking with the correct person using two identifiers. She tried to connect vi a video but had technology issues so visit was completed by phone.   I discussed the limitations, risks, security and privacy concerns of performing an evaluation and management service by telephone and the availability of in person appointments. I also discussed with the patient that there may be a patient responsible charge related to this service. The patient expressed understanding and agreed to proceed.  Location patient: home Location provider: work or home office Participants present for the call: patient, provider Patient did not have a visit in the prior 7 days to address this/these issue(s).   History of Present Illness:   Acute visit for laryngitis: -symptoms started about 1 week ago -symptoms include mild sore throat, scratchy throat, mild hoarseness, mild cough -denies fever, SOB, NVD, loss of taste, sneezing, body aches, swollen glands,  dysphagia, chills -Temp 98.4 -she has been around her daughter and grandchildren and to the grocery store with a mask - her family members have been around a lot of people, but have not been sick. She is worried about coronavirus and is requesting testing.  Observations/Objective: Patient sounds cheerful and well on the phone. I do not appreciate any SOB. Speech and thought processing are grossly intact. Patient reported vitals:  Assessment and Plan:  Laryngitis Sore throat  Discussed potential etiologies, options for evaluation, treatment options, risks, return precautions. Suspect viral or seasonal allergy etiology. Doubt strep but offered curbside strep testing - declined. She wants COVID19 testing - advised assistant to order. Also advised self isolation per CDC. Follow up Monday or Tuesday. She is to seek care at [facility] if  worsening or severe symptoms over the weekend.  I did not refer this patient for an OV in the next 24 hours for this/these issue(s).  I discussed the assessment and treatment plan with the patient. The patient was provided an opportunity to ask questions and all were answered. The patient agreed with the plan and demonstrated an understanding of the instructions.   The patient was advised to call back or seek an in-person evaluation if the symptoms worsen or if the condition fails to improve as anticipated.  I provided 15 minutes of non-face-to-face time during this encounter.   Follow up instructions: Advised assistant Wendie Simmer to help patient arrange the following: -COVID19 testing -follow up with PCP or me on Mon or [place], DO

## 2019-01-04 NOTE — Telephone Encounter (Signed)
Clinic RN spoke with patient. Advised patient d/t her sore throat as a symptom and wanting COVID testign for her safety and hours we are not letting acute visit in the office. Informed patient Dr. Martinique is off today and offered patient a virtual visit with another provider. Patient agreed. Patient scheduled for a virtual visit with Dr. Maudie Mercury at [facility] .

## 2019-01-04 NOTE — Telephone Encounter (Signed)
-----   Message from Lucretia Kern, DO sent at 01/04/2019  3:16 PM EDT ----- -coronavirus testing - please order-follow up with PCP or me Monday or Tuesday

## 2019-01-04 NOTE — Telephone Encounter (Signed)
Pt scheduled for covid testing at [facility] site for tomorrow.  Pt with sore throat  Requested by Dr. Maudie Mercury at [facility]. .  Testing process reviewed with pt, verbalizes understanding; stay in car, wear mask.   Pts CB# [phone]

## 2019-01-04 NOTE — Telephone Encounter (Signed)
Patient called yesterday wanting to schedule a an appointment for a sore throat and also wants to be tested for COVID-19. She really wants to come in the office because she doesn't see how the doctor would be able to look at her throat virtually. I told her that would are only seeing well patients in the office at this time and she said that she will call Dr. Charlett Blake which sees her also to see if she can be seen physically by [facility] instead of virtually.  Please advise

## 2019-01-04 NOTE — Patient Instructions (Addendum)
I have asked my assistant to order Coronavirus (COVID19) testing for you. Please call our office if you have any concerns or questions or this testing has not been arranged in the next 24-48 hours.   Self Isolate: -see the CDC site for information:   RunningShows.co.za.html   -stay home except for to seek medical care -stay in your own room away from others in your house, wash hands frequently, wear a mask if you leave your room and interacted as little as possible with others -seek medical care if worsening - call out office for a visit or call ahead if going elsewhere -seek emergency care if very sick or severe symptoms - call 911 -isolate for at least 10 days from the onset of symptoms PLUS 3 days of no fever PLUS 3 days of improving symptoms, unless instructed otherwise by a doctor.   Follow up Monday or Tuesday. Sooner as needed.

## 2019-01-05 ENCOUNTER — Other Ambulatory Visit: Payer: Medicare Other

## 2019-01-05 DIAGNOSIS — Z20822 Contact with and (suspected) exposure to covid-19: Secondary | ICD-10-CM

## 2019-01-08 ENCOUNTER — Encounter: Payer: Self-pay | Admitting: Family Medicine

## 2019-01-08 ENCOUNTER — Other Ambulatory Visit: Payer: Self-pay

## 2019-01-08 ENCOUNTER — Ambulatory Visit (INDEPENDENT_AMBULATORY_CARE_PROVIDER_SITE_OTHER): Payer: Medicare Other | Admitting: Family Medicine

## 2019-01-08 VITALS — BP 139/84 | HR 98

## 2019-01-08 DIAGNOSIS — R7303 Prediabetes: Secondary | ICD-10-CM

## 2019-01-08 DIAGNOSIS — E782 Mixed hyperlipidemia: Secondary | ICD-10-CM | POA: Diagnosis not present

## 2019-01-08 DIAGNOSIS — I10 Essential (primary) hypertension: Secondary | ICD-10-CM | POA: Diagnosis not present

## 2019-01-08 DIAGNOSIS — M159 Polyosteoarthritis, unspecified: Secondary | ICD-10-CM

## 2019-01-08 DIAGNOSIS — Z0000 Encounter for general adult medical examination without abnormal findings: Secondary | ICD-10-CM

## 2019-01-08 NOTE — Assessment & Plan Note (Signed)
Healthy lifestyle for primary prevention recommended. Further recommendation will be given according to A1c results. 

## 2019-01-08 NOTE — Assessment & Plan Note (Signed)
Based on reported BPs, problem seems to be adequately controlled. Continue hydrochlorothiazide 25 mg daily, lisinopril 10 mg daily, and metoprolol tartrate 25 mg twice daily. We discussed her concerns about lisinopril, she finally decided to continue medication given the fact she is tolerating it well. Continue monitoring BP at home. Continue low-salt diet. Follow-up in 6 months.

## 2019-01-08 NOTE — Assessment & Plan Note (Signed)
We discussed possible treatment options, explained that it is not clear evidence about collagen benefits in regard to OA. Fish oil, turmeric with black pepper may help. Fall prevention and low impact exercise recommended.

## 2019-01-08 NOTE — Assessment & Plan Note (Signed)
For now continue on nonpharmacologic treatment, low-fat diet. We discussed side effects of statin medication as well as CVD benefits. Further recommendation will be given according to lipid panel results + 10 years risk for CVD.

## 2019-01-08 NOTE — Progress Notes (Signed)
Virtual Visit via Video Note   I connected with [name] on 01/08/19 at 11:30 AM EDT by a video enabled telemedicine application and verified that I am speaking with the correct person using two identifiers.  Location patient: home Location provider:work or home office Persons participating in the virtual visit: patient, provider  I discussed the limitations of evaluation and management by telemedicine and the availability of in person appointments. The patient expressed understanding and agreed to proceed.   HPI: [name] is a 73 yo female with history of hyperlipidemia, hypertension, generalized OA, and allergies among some, who is being seen for follow-up and Medicare preventive visit. No new problems since her last visit. Last follow-up appointment on 02/13/2018.  HLD: She is not on statin medication, she refused to take it after reviewing side effects. She is following low fat diet.  Lab Results  Component Value Date   CHOL 195 02/13/2018   HDL 32.50 (L) 02/13/2018   LDLCALC 125 (H) 02/13/2018   LDLDIRECT 144.0 06/07/2017   TRIG 189.0 (H) 02/13/2018   CHOLHDL 6 02/13/2018   In general she follows a healthful diet. She does not exercise regularly but she is active with chores around her house. Denies abdominal pain, nausea,vomiting, polydipsia,polyuria, or polyphagia.   Lab Results  Component Value Date   HGBA1C 6.0 02/13/2018    HTN: She is on HCTZ 25 mg, Lisinopril 10 mg, Metoprolol tartrate 25 mg bid. She wonders if lisinopril is safe to continue.  Denies severe/frequent headache, visual changes, chest pain, dyspnea, palpitation, claudication, focal weakness, or edema.  Medicare preventive visit Last preventive visit in 05/2017.  She lives with her husband. Independent ADL's and IADL's. No falls in the past year and denies depression symptoms.  Functional Status Survey: Is the patient deaf or have difficulty hearing?: No Does the patient have difficulty  seeing, even when wearing glasses/contacts?: No Does the patient have difficulty concentrating, remembering, or making decisions?: No Does the patient have difficulty walking or climbing stairs?: No Does the patient have difficulty dressing or bathing?: No Does the patient have difficulty doing errands alone such as visiting a doctor's office or shopping?: No  Fall Risk  01/08/2019 02/03/2018 09/02/2016 04/14/2015 04/05/2014  Falls in the past year? 0 No No Yes No  Number falls in past yr: 0 - - 1 -  Injury with Fall? 0 - - Yes -  Follow up Education provided - - - -     Providers she sees regularly:  Eye care provider: Dr. Hyacinth MeekerMiller.  Depression screen [facility] 01/08/2019  Decreased Interest 0  Down, Depressed, Hopeless 0  PHQ - 2 Score 0   Mini-Cog - 01/08/19 1205    How many words correct?  3       Hearing Screening   125Hz  250Hz  500Hz  1000Hz  2000Hz  3000Hz  4000Hz  6000Hz  8000Hz   Right ear:           Left ear:           Vision Screening Comments: Virtual visit.  ROS: See pertinent positives and negatives per HPI.  Past Medical History:  Diagnosis Date  . Allergic rhinitis   . Arthralgia 09/30/2015  . Bronchitis, acute 01/09/2017  . Chronic shoulder pain 12/07/2016  . Epistaxis 09/30/2015  . Globus sensation 02/23/2015  . Heart murmur 12/07/2016  . HTN (hypertension)   . Left hip pain 09/30/2015  . Medicare annual wellness visit, subsequent 04/14/2015  . Pain in the wrist, unspecified laterality 03/03/2014  . Tachycardia  Episodic    Past Surgical History:  Procedure Laterality Date  . APPENDECTOMY  73 yrs old  . BUNIONECTOMY    . WISDOM TOOTH EXTRACTION  73 yrs old    Family History  Problem Relation Age of Onset  . Cancer Father        melanoma  . Diabetes Mother   . Hypertension Mother   . Hyperlipidemia Mother   . Stroke Mother   . Heart attack Mother   . Heart attack Brother   . Cancer Brother        pancreatic  . Colon cancer Neg Hx   . Breast cancer Neg Hx    . Coronary artery disease Neg Hx     Social History   Socioeconomic History  . Marital status: Married    Spouse name: Not on file  . Number of children: 1  . Years of education: Not on file  . Highest education level: Not on file  Occupational History  . Occupation: Scientist, research (physical sciences): RETIRED  Social Needs  . Financial resource strain: Not on file  . Food insecurity    Worry: Not on file    Inability: Not on file  . Transportation needs    Medical: Not on file    Non-medical: Not on file  Tobacco Use  . Smoking status: Never Smoker  . Smokeless tobacco: Never Used  Substance and Sexual Activity  . Alcohol use: Yes    Comment: wine  . Drug use: No  . Sexual activity: Yes    Comment: lives with husband  Lifestyle  . Physical activity    Days per week: Not on file    Minutes per session: Not on file  . Stress: Not on file  Relationships  . Social Herbalist on phone: Not on file    Gets together: Not on file    Attends religious service: Not on file    Active member of club or organization: Not on file    Attends meetings of clubs or organizations: Not on file    Relationship status: Not on file  . Intimate partner violence    Fear of current or ex partner: Not on file    Emotionally abused: Not on file    Physically abused: Not on file    Forced sexual activity: Not on file  Other Topics Concern  . Not on file  Social History Narrative   University - [place], Masters Degree - counseling, [employer]   Married '69 - 31 years, divorced; married '82   21 daughter - '72; 2 grandchildren      Regular Exercise -  YES          Current Outpatient Medications:  .  albuterol (PROVENTIL HFA;VENTOLIN HFA) 108 (90 Base) MCG/ACT inhaler, Inhale 2 puffs into the lungs every 6 (six) hours as needed for wheezing or shortness of breath., Disp: 1 Inhaler, Rfl: 0 .  aspirin 81 MG tablet, Take 81 mg by mouth daily., Disp: , Rfl:  .   Cholecalciferol (VITAMIN D3) 2000 units TABS, Take 1 tablet by mouth daily., Disp: , Rfl:  .  COLLAGEN PO, Take by mouth daily., Disp: , Rfl:  .  Cyanocobalamin (VITAMIN B-12) 5000 MCG TBDP, Take 1 tablet by mouth daily., Disp: , Rfl:  .  Glucosamine-Chondroit-Vit C-Mn (GLUCOSAMINE-CHONDROITIN) TABS, Take by mouth daily., Disp: , Rfl:  .  hydrochlorothiazide (HYDRODIURIL) 25 MG tablet, TAKE ONE TABLET BY MOUTH ONE  TIME DAILY , Disp: 90 tablet, Rfl: 2 .  lisinopril (ZESTRIL) 10 MG tablet, TAKE TWO TABLETS BY MOUTH DAILY , Disp: 180 tablet, Rfl: 0 .  metoprolol tartrate (LOPRESSOR) 25 MG tablet, TAKE HALF TABLET BY MOUTH TWICE DAILY, Disp: 90 tablet, Rfl: 0 .  Multiple Minerals-Vitamins (CALCIUM CITRATE PLUS PO), Take 2 tablets by mouth daily., Disp: , Rfl:  .  Multiple Vitamin (MULTI-VITAMINS) TABS, Take by mouth., Disp: , Rfl:  .  Omega-3 Fatty Acids (FISH OIL) 1000 MG CAPS, Take 1,000 capsules by mouth daily., Disp: , Rfl:  .  pravastatin (PRAVACHOL) 20 MG tablet, Take 1 tablet (20 mg total) by mouth daily., Disp: 90 tablet, Rfl: 3  EXAM:  VITALS per patient if applicable:BP 139/84   Pulse 98   GENERAL: alert, oriented, appears well and in no acute distress  HEENT: atraumatic, conjunctiva clear, no obvious facial abnormalities on inspection.  NECK: normal movements of the head and neck  LUNGS: on inspection no signs of respiratory distress, breathing rate appears normal, no obvious gross SOB, gasping or wheezing  CV: no obvious cyanosis  MS: moves all visible extremities without noticeable abnormality  PSYCH/NEURO: pleasant and cooperative, no obvious depression or anxiety, speech and thought processing grossly intact  ASSESSMENT AND PLAN: [name] was seen today ,virtual visit, for chronic disease management and Medicare preventive visit. Discussed the following assessment and plan:  Orders Placed This Encounter  Procedures  . Comprehensive metabolic panel  . Lipid panel  .  Hemoglobin A1c    Medicare annual wellness visit, subsequent  We discussed the importance of staying active, physically and mentally, as well as the benefits of a healthy/balance diet. Low impact exercise that involve stretching and strengthing are ideal. Vaccines up-to-date. We discussed preventive screening for the next 5-10 years, summery of recommendations discussed during her visit. Colonoscopy 05/28/2013,no further screening recommended. Mammogram 01/2018 reported a normal. DEXA 05/2017 osteopenia. Annual eye exam and glaucoma screening Because history of prediabetes, annual diabetes screening Fall prevention.  Advance directives and end of life discussed, she has power of attorney and living will, asked to bring a copy next OV.    Essential hypertension Based on reported BPs, problem seems to be adequately controlled. Continue hydrochlorothiazide 25 mg daily, lisinopril 10 mg daily, and metoprolol tartrate 25 mg twice daily. We discussed her concerns about lisinopril, she finally decided to continue medication given the fact she is tolerating it well. Continue monitoring BP at home. Continue low-salt diet. Follow-up in 6 months.  Hyperlipidemia, mixed For now continue on nonpharmacologic treatment, low-fat diet. We discussed side effects of statin medication as well as CVD benefits. Further recommendation will be given according to lipid panel results + 10 years risk for CVD.  Prediabetes Healthy lifestyle for primary prevention recommended. Further recommendation will be given according to A1c results.  Generalized osteoarthritis of multiple sites We discussed possible treatment options, explained that it is not clear evidence about collagen benefits in regard to OA. Fish oil, turmeric with black pepper may help. Fall prevention and low impact exercise recommended.   I discussed the assessment and treatment plan with the patient. She was provided an opportunity to ask  questions and all were answered. The patient agreed with the plan and demonstrated an understanding of the instructions.     Return in about 6 months (around 07/10/2019) for htn.   Betty SwazilandJordan, MD

## 2019-01-09 LAB — NOVEL CORONAVIRUS, NAA: SARS-CoV-2, NAA: NOT DETECTED

## 2019-01-10 ENCOUNTER — Other Ambulatory Visit (INDEPENDENT_AMBULATORY_CARE_PROVIDER_SITE_OTHER): Payer: Medicare Other

## 2019-01-10 ENCOUNTER — Other Ambulatory Visit: Payer: Self-pay

## 2019-01-10 DIAGNOSIS — I10 Essential (primary) hypertension: Secondary | ICD-10-CM

## 2019-01-10 DIAGNOSIS — E782 Mixed hyperlipidemia: Secondary | ICD-10-CM | POA: Diagnosis not present

## 2019-01-10 DIAGNOSIS — R7303 Prediabetes: Secondary | ICD-10-CM

## 2019-01-10 LAB — COMPREHENSIVE METABOLIC PANEL
ALT: 11 U/L (ref 0–35)
AST: 16 U/L (ref 0–37)
Albumin: 4.5 g/dL (ref 3.5–5.2)
Alkaline Phosphatase: 60 U/L (ref 39–117)
BUN: 22 mg/dL (ref 6–23)
CO2: 29 mEq/L (ref 19–32)
Calcium: 9.8 mg/dL (ref 8.4–10.5)
Chloride: 102 mEq/L (ref 96–112)
Creatinine, Ser: 0.72 mg/dL (ref 0.40–1.20)
GFR: 79 mL/min (ref >60.00)
Glucose, Bld: 106 mg/dL — ABNORMAL HIGH (ref 70–99)
Potassium: 4.2 mEq/L (ref 3.5–5.1)
Sodium: 141 mEq/L (ref 135–145)
Total Bilirubin: 0.5 mg/dL (ref 0.2–1.2)
Total Protein: 7.3 g/dL (ref 6.0–8.3)

## 2019-01-10 LAB — LIPID PANEL
Cholesterol: 195 mg/dL (ref 0–200)
HDL: 31.8 mg/dL — ABNORMAL LOW (ref >39.00)
NonHDL: 162.85
Total CHOL/HDL Ratio: 6
Triglycerides: 219 mg/dL — ABNORMAL HIGH (ref 0.0–149.0)
VLDL: 43.8 mg/dL — ABNORMAL HIGH (ref 0.0–40.0)

## 2019-01-10 LAB — LDL CHOLESTEROL, DIRECT: Direct LDL: 131 mg/dL

## 2019-01-10 LAB — HEMOGLOBIN A1C: Hgb A1c MFr Bld: 5.9 % (ref 4.6–6.5)

## 2019-01-17 ENCOUNTER — Other Ambulatory Visit: Payer: Self-pay | Admitting: *Deleted

## 2019-01-17 MED ORDER — OMEGA-3-ACID ETHYL ESTERS 1 G PO CAPS: 1.0000 g | ORAL_CAPSULE | Freq: Two times a day (BID) | ORAL | 3 refills | Status: DC

## 2019-01-29 ENCOUNTER — Other Ambulatory Visit: Payer: Self-pay | Admitting: Family Medicine

## 2019-02-12 ENCOUNTER — Other Ambulatory Visit: Payer: Self-pay | Admitting: Family Medicine

## 2019-02-12 DIAGNOSIS — I10 Essential (primary) hypertension: Secondary | ICD-10-CM

## 2019-02-13 NOTE — Telephone Encounter (Signed)
Please advise 

## 2019-02-28 ENCOUNTER — Encounter: Payer: Self-pay | Admitting: Family Medicine

## 2019-03-06 ENCOUNTER — Telehealth: Payer: Self-pay | Admitting: *Deleted

## 2019-03-06 DIAGNOSIS — I10 Essential (primary) hypertension: Secondary | ICD-10-CM

## 2019-03-06 MED ORDER — HYDROCHLOROTHIAZIDE 25 MG PO TABS: 25.0000 mg | ORAL_TABLET | Freq: Every day | ORAL | 2 refills | Status: DC

## 2019-03-06 MED ORDER — LISINOPRIL 10 MG PO TABS: 20.0000 mg | ORAL_TABLET | Freq: Every day | ORAL | 2 refills | Status: DC

## 2019-03-06 MED ORDER — OMEGA-3-ACID ETHYL ESTERS 1 G PO CAPS: 1.0000 g | ORAL_CAPSULE | Freq: Two times a day (BID) | ORAL | 2 refills | Status: DC

## 2019-03-06 MED ORDER — METOPROLOL TARTRATE 25 MG PO TABS: ORAL_TABLET | ORAL | 0 refills | Status: DC

## 2019-03-07 ENCOUNTER — Other Ambulatory Visit: Payer: Self-pay | Admitting: *Deleted

## 2019-03-07 MED ORDER — OMEGA-3-ACID ETHYL ESTERS 1 G PO CAPS: 1.0000 g | ORAL_CAPSULE | Freq: Two times a day (BID) | ORAL | 2 refills | Status: DC

## 2019-03-07 NOTE — Telephone Encounter (Signed)
90 day supply sent to [facility] as requested.

## 2019-03-07 NOTE — Telephone Encounter (Signed)
Pt takes 2 day of the  omega-3 acid ethyl esters (LOVAZA) 1 g capsule  Pt requesting a 90 day.  Pt got quantity 90 which is 45 day supply. Can you please resend to pharmacy  [facility] [address] [place] [phone] (Phone) [phone] (Fax)

## 2019-03-21 ENCOUNTER — Other Ambulatory Visit: Payer: Self-pay

## 2019-03-21 ENCOUNTER — Telehealth (INDEPENDENT_AMBULATORY_CARE_PROVIDER_SITE_OTHER): Payer: Medicare Other | Admitting: Internal Medicine

## 2019-03-21 DIAGNOSIS — R198 Other specified symptoms and signs involving the digestive system and abdomen: Secondary | ICD-10-CM | POA: Diagnosis not present

## 2019-03-21 DIAGNOSIS — R197 Diarrhea, unspecified: Secondary | ICD-10-CM

## 2019-03-21 NOTE — Progress Notes (Signed)
Virtual Visit via Video Note  I connected with Eileen Hansen on 03/21/19 at  3:00 PM EDT by a video enabled telemedicine application and verified that I am speaking with the correct person using two identifiers.  Location patient: home Location provider: work office Persons participating in the virtual visit: patient, provider  I discussed the limitations of evaluation and management by telemedicine and the availability of in person appointments. The patient expressed understanding and agreed to proceed.   HPI: She has scheduled an acute visit today. This is day 3 of stomach cramping and diarrhea. On Monday she had 5 episodes of watery stool, 3 on Tuesday and so far 2 today. She has bad intestinal cramping. No fevers, mild nausea but no emesis. She has been having a HA and significant myalgias. No URI-type symptoms. Over the weekend she went to her granddaughter's softball game, she states she did not wear a mask but was not in close contact with others. She ate a smoked salmon sandwich on Sunday before her symptoms began. She wants to know what she can do.   ROS: Constitutional: Denies fever, chills, diaphoresis. HEENT: Denies photophobia, eye pain, redness, hearing loss, ear pain, congestion, sore throat, rhinorrhea, sneezing, mouth sores, trouble swallowing, neck pain, neck stiffness and tinnitus.   Respiratory: Denies SOB, DOE, cough, chest tightness,  and wheezing.   Cardiovascular: Denies chest pain, palpitations and leg swelling.  Gastrointestinal: Denies vomiting, constipation, blood in stool and abdominal distention.  Genitourinary: Denies dysuria, urgency, frequency, hematuria, flank pain and difficulty urinating.  Endocrine: Denies: hot or cold intolerance, sweats, changes in hair or nails, polyuria, polydipsia. Musculoskeletal: Denies myalgias, back pain, joint swelling, arthralgias and gait problem.  Skin: Denies pallor, rash and wound.  Neurological: Denies dizziness,  seizures, syncope, weakness, light-headedness, numbness and headaches.  Hematological: Denies adenopathy. Easy bruising, personal or family bleeding history  Psychiatric/Behavioral: Denies suicidal ideation, mood changes, confusion, nervousness, sleep disturbance and agitation   Past Medical History:  Diagnosis Date  . Allergic rhinitis   . Arthralgia 09/30/2015  . Bronchitis, acute 01/09/2017  . Chronic shoulder pain 12/07/2016  . Epistaxis 09/30/2015  . Globus sensation 02/23/2015  . Heart murmur 12/07/2016  . HTN (hypertension)   . Left hip pain 09/30/2015  . Medicare annual wellness visit, subsequent 04/14/2015  . Pain in the wrist, unspecified laterality 03/03/2014  . Tachycardia    Episodic    Past Surgical History:  Procedure Laterality Date  . APPENDECTOMY  73 yrs old  . BUNIONECTOMY    . WISDOM TOOTH EXTRACTION  73 yrs old    Family History  Problem Relation Age of Onset  . Cancer Father        melanoma  . Diabetes Mother   . Hypertension Mother   . Hyperlipidemia Mother   . Stroke Mother   . Heart attack Mother   . Heart attack Brother   . Cancer Brother        pancreatic  . Colon cancer Neg Hx   . Breast cancer Neg Hx   . Coronary artery disease Neg Hx     SOCIAL HX:   reports that she has never smoked. She has never used smokeless tobacco. She reports current alcohol use. She reports that she does not use drugs.   Current Outpatient Medications:  .  albuterol (PROVENTIL HFA;VENTOLIN HFA) 108 (90 Base) MCG/ACT inhaler, Inhale 2 puffs into the lungs every 6 (six) hours as needed for wheezing or shortness of breath., Disp:  1 Inhaler, Rfl: 0 .  aspirin 81 MG tablet, Take 81 mg by mouth daily., Disp: , Rfl:  .  Cholecalciferol (VITAMIN D3) 2000 units TABS, Take 1 tablet by mouth daily., Disp: , Rfl:  .  COLLAGEN PO, Take by mouth daily., Disp: , Rfl:  .  Cyanocobalamin (VITAMIN B-12) 5000 MCG TBDP, Take 1 tablet by mouth daily., Disp: , Rfl:  .   Glucosamine-Chondroit-Vit C-Mn (GLUCOSAMINE-CHONDROITIN) TABS, Take by mouth daily., Disp: , Rfl:  .  hydrochlorothiazide (HYDRODIURIL) 25 MG tablet, Take 1 tablet (25 mg total) by mouth daily., Disp: 90 tablet, Rfl: 2 .  lisinopril (ZESTRIL) 10 MG tablet, Take 2 tablets (20 mg total) by mouth daily., Disp: 180 tablet, Rfl: 2 .  metoprolol tartrate (LOPRESSOR) 25 MG tablet, TAKE HALF TABLET BY MOUTH TWICE DAILY, Disp: 90 tablet, Rfl: 0 .  Multiple Minerals-Vitamins (CALCIUM CITRATE PLUS PO), Take 2 tablets by mouth daily., Disp: , Rfl:  .  Multiple Vitamin (MULTI-VITAMINS) TABS, Take by mouth., Disp: , Rfl:  .  omega-3 acid ethyl esters (LOVAZA) 1 g capsule, Take 1 capsule (1 g total) by mouth 2 (two) times daily., Disp: 180 capsule, Rfl: 2 .  Omega-3 Fatty Acids (FISH OIL) 1000 MG CAPS, Take 1,000 capsules by mouth daily., Disp: , Rfl:  .  pravastatin (PRAVACHOL) 20 MG tablet, Take 1 tablet (20 mg total) by mouth daily., Disp: 90 tablet, Rfl: 3  EXAM:   VITALS per patient if applicable: none reported  GENERAL: alert, oriented, appears well and in no acute distress  HEENT: atraumatic, conjunttiva clear, no obvious abnormalities on inspection of external nose and ears  NECK: normal movements of the head and neck  LUNGS: on inspection no signs of respiratory distress, breathing rate appears normal, no obvious gross increased work of breathing, gasping or wheezing  CV: no obvious cyanosis  MS: moves all visible extremities without noticeable abnormality  PSYCH/NEURO: pleasant and cooperative, no obvious depression or anxiety, speech and thought processing grossly intact  ASSESSMENT AND PLAN:   Diarrhea, unspecified type -Common things being common, this is likely food poisoning or acute viral gastroenteritis and I would expect her symptoms to slowly improve over the next 5-7 days. -Of course, in the era of COVID, feel we need to rule this out especially given her prominent myalgias and  HA. She will be sent for testing. Explained, that if positive, would not change our management but would help with contract tracing and with community spread. 14 day isolation is recommended. -Offered zofran, but she will hold off. -RTC if no improvement in 7-10 days.    I discussed the assessment and treatment plan with the patient. The patient was provided an opportunity to ask questions and all were answered. The patient agreed with the plan and demonstrated an understanding of the instructions.   The patient was advised to call back or seek an in-person evaluation if the symptoms worsen or if the condition fails to improve as anticipated.    Chaya JanEstela Hernandez Acosta, MD  [facility]

## 2019-03-22 ENCOUNTER — Other Ambulatory Visit: Payer: Self-pay

## 2019-03-22 DIAGNOSIS — Z20822 Contact with and (suspected) exposure to covid-19: Secondary | ICD-10-CM

## 2019-03-23 LAB — NOVEL CORONAVIRUS, NAA: SARS-CoV-2, NAA: NOT DETECTED

## 2019-03-27 ENCOUNTER — Other Ambulatory Visit: Payer: Self-pay | Admitting: Family Medicine

## 2019-05-02 ENCOUNTER — Other Ambulatory Visit: Payer: Self-pay | Admitting: Family Medicine

## 2019-05-07 ENCOUNTER — Other Ambulatory Visit: Payer: Self-pay

## 2019-05-07 ENCOUNTER — Ambulatory Visit (INDEPENDENT_AMBULATORY_CARE_PROVIDER_SITE_OTHER): Payer: Medicare Other

## 2019-05-07 DIAGNOSIS — Z23 Encounter for immunization: Secondary | ICD-10-CM | POA: Diagnosis not present

## 2019-05-23 ENCOUNTER — Other Ambulatory Visit: Payer: Self-pay | Admitting: Family Medicine

## 2019-05-23 NOTE — Telephone Encounter (Signed)
Denied.  Spoke to [facility].  Rx was filled on 05/04/2019 when it was sent and pt picked it up on 05/09/2019.  Refill request is too early.  Request cancelled by pharmacy.  Nothing further needed.

## 2019-06-25 ENCOUNTER — Telehealth: Payer: Medicare Other | Admitting: Family Medicine

## 2019-06-25 ENCOUNTER — Other Ambulatory Visit: Payer: Self-pay

## 2019-06-25 ENCOUNTER — Encounter: Payer: Self-pay | Admitting: Family Medicine

## 2019-06-25 VITALS — BP 122/70 | HR 70 | Temp 95.0°F | Resp 16 | Ht 59.06 in | Wt 120.0 lb

## 2019-06-25 DIAGNOSIS — E782 Mixed hyperlipidemia: Secondary | ICD-10-CM

## 2019-06-25 DIAGNOSIS — L509 Urticaria, unspecified: Secondary | ICD-10-CM | POA: Diagnosis not present

## 2019-06-25 DIAGNOSIS — L659 Nonscarring hair loss, unspecified: Secondary | ICD-10-CM | POA: Diagnosis not present

## 2019-06-25 DIAGNOSIS — I10 Essential (primary) hypertension: Secondary | ICD-10-CM | POA: Diagnosis not present

## 2019-06-25 DIAGNOSIS — M159 Polyosteoarthritis, unspecified: Secondary | ICD-10-CM | POA: Diagnosis not present

## 2019-06-25 NOTE — Progress Notes (Signed)
Chief Complaint  Patient presents with  . Rash    HPI:  Eileen Hansen is a 73 y.o. female, who is here today c/o 1-2 days of very pruritic erythematous rash first noted under breast then upper chest, upper extremities, abdomen,and lower extremities. Describes lesions as red and raised. She denies any new medication, detergent, soap, or body product. No known insect bite or outdoor exposures to plants. No sick contact. No Hx of eczema or similar rash in the past.  OTC medication for this problem: "Ceravie", "itchy cream." She took a shower in the middle of the night and applied alcohol.  She denies oral lesions/edema,cough, wheezing, dyspnea, abdominal pain, nausea, or vomiting.  Today when she woke up rash was gone.  -C/O hair loss for the past 2 months. She has not noted alopecic areas or scalp rash. She started taking Biotin.   She also would like to follow on some of her chronic medical problems. Requesting triglycerides dose to be checked. She is currently on Lovaza. 1 g daily, was prescribed bid.  She also follows low fat diet.  Component     Latest Ref Rng & Units 01/10/2019  Cholesterol     0 - 200 mg/dL 161195  Triglycerides     0.0 - 149.0 mg/dL 096.0219.0 (H)  HDL Cholesterol     >39.00 mg/dL 45.4031.80 (L)  VLDL     0.0 - 40.0 mg/dL 98.143.8 (H)  Total CHOL/HDL Ratio      6  NonHDL      162.85   IP joint pain and some mild deformities. No edema or erythema. She has had problem for years and getting worse.  Exacerbated by repetitive manual activities and alleviated by rest.  HTN: She is on Metoprolol Tartrate 25 mg bid and Lisinopril 10 mg daily.  Negative for severe/frequent headache, visual changes, CP, palpitation, claudication, focal weakness, or edema.  Component     Latest Ref Rng & Units 01/10/2019  Sodium     135 - 145 mEq/L 141  Potassium     3.5 - 5.1 mEq/L 4.2  Chloride     96 - 112 mEq/L 102  CO2     19 - 32 mEq/L 29  Glucose     70 -  99 mg/dL 191106 (H)  BUN     6 - 23 mg/dL 22  Creatinine     4.780.40 - 1.20 mg/dL 2.950.72   Review of Systems  Constitutional: Negative for activity change, appetite change and fatigue.  HENT: Negative for mouth sores, nosebleeds and sore throat.   Eyes: Negative for redness and visual disturbance.  Respiratory:See HPI. Cardiovascular: See HPI Gastrointestinal:Negative for changes in bowel habits.  Genitourinary: Negative for decreased urine volume and hematuria.  Musculoskeletal: Negative for gait problem and myalgias.  Skin: Negative for wound.  Allergic/Immunologic: Positive for environmental allergies.  Neurological: Negative for syncope, facial asymmetry, speech difficulty. Hematological: Negative for adenopathy. Does not bruise/bleed easily.  Psychiatric/Behavioral: Negative for confusion. The patient is not nervous/anxious.   Rest of ROS referred to positives and negatives in HPI.  Current Outpatient Medications on File Prior to Visit  Medication Sig Dispense Refill  . albuterol (PROVENTIL HFA;VENTOLIN HFA) 108 (90 Base) MCG/ACT inhaler Inhale 2 puffs into the lungs every 6 (six) hours as needed for wheezing or shortness of breath. 1 Inhaler 0  . aspirin 81 MG tablet Take 81 mg by mouth daily.    . Cholecalciferol (VITAMIN D3) 2000 units TABS  Take 1 tablet by mouth daily.    . COLLAGEN PO Take by mouth daily.    . Cyanocobalamin (VITAMIN B-12) 5000 MCG TBDP Take 1 tablet by mouth daily.    . Glucosamine-Chondroit-Vit C-Mn (GLUCOSAMINE-CHONDROITIN) TABS Take by mouth daily.    . hydrochlorothiazide (HYDRODIURIL) 25 MG tablet Take 1 tablet (25 mg total) by mouth daily. 90 tablet 2  . lisinopril (ZESTRIL) 10 MG tablet TAKE TWO TABLETS BY MOUTH DAILY  180 tablet 0  . metoprolol tartrate (LOPRESSOR) 25 MG tablet TAKE HALF TABLET BY MOUTH TWICE DAILY  90 tablet 0  . Multiple Minerals-Vitamins (CALCIUM CITRATE PLUS PO) Take 2 tablets by mouth daily.    . Multiple Vitamin (MULTI-VITAMINS)  TABS Take by mouth.    . Omega-3 Fatty Acids (FISH OIL) 1000 MG CAPS Take 1,000 capsules by mouth daily.    . pravastatin (PRAVACHOL) 20 MG tablet Take 1 tablet (20 mg total) by mouth daily. 90 tablet 3  . omega-3 acid ethyl esters (LOVAZA) 1 g capsule Take 1 capsule (1 g total) by mouth 2 (two) times daily. 180 capsule 2   No current facility-administered medications on file prior to visit.      Past Medical History:  Diagnosis Date  . Allergic rhinitis   . Arthralgia 09/30/2015  . Bronchitis, acute 01/09/2017  . Chronic shoulder pain 12/07/2016  . Epistaxis 09/30/2015  . Globus sensation 02/23/2015  . Heart murmur 12/07/2016  . HTN (hypertension)   . Left hip pain 09/30/2015  . Medicare annual wellness visit, subsequent 04/14/2015  . Pain in the wrist, unspecified laterality 03/03/2014  . Tachycardia    Episodic   Allergies  Allergen Reactions  . Fenofibrate Other (See Comments)    Abdominal pain  . Penicillins Rash    REACTION: Hives Anaphylaxis    Social History   Socioeconomic History  . Marital status: Married    Spouse name: Not on file  . Number of children: 1  . Years of education: Not on file  . Highest education level: Not on file  Occupational History  . Occupation: Dentist: RETIRED  Social Needs  . Financial resource strain: Not on file  . Food insecurity    Worry: Not on file    Inability: Not on file  . Transportation needs    Medical: Not on file    Non-medical: Not on file  Tobacco Use  . Smoking status: Never Smoker  . Smokeless tobacco: Never Used  Substance and Sexual Activity  . Alcohol use: Yes    Comment: wine  . Drug use: No  . Sexual activity: Yes    Comment: lives with husband  Lifestyle  . Physical activity    Days per week: Not on file    Minutes per session: Not on file  . Stress: Not on file  Relationships  . Social Musician on phone: Not on file    Gets together: Not on file    Attends religious  service: Not on file    Active member of club or organization: Not on file    Attends meetings of clubs or organizations: Not on file    Relationship status: Not on file  Other Topics Concern  . Not on file  Social History Narrative   University - [place], Masters Degree - counseling, [employer]   Married '69 - 7 years, divorced; married '82   21 daughter - '72; 2 grandchildren  Regular Exercise -  YES          Vitals:   06/25/19 1452  BP: 122/70  Pulse: 70  Resp: 16  Temp: (!) 95 F (35 C)  SpO2: 95%   Body mass index is 24.19 kg/m.  Physical Exam  Nursing note and vitals reviewed. Constitutional: She is oriented to person, place, and time. She appears well-developed. No distress.  HENT:  Head: Normocephalic and atraumatic.  Mouth/Throat: Oropharynx is clear and moist and mucous membranes are normal.  Eyes: Pupils are equal, round, and reactive to light. Conjunctivae are normal.  Cardiovascular: Normal rate and regular rhythm.  No murmur heard. Pulses:      Dorsalis pedis pulses are 2+ on the right side and 2+ on the left side.  Respiratory: Effort normal and breath sounds normal. No respiratory distress.  GI: Soft. She exhibits no mass. There is no hepatomegaly. There is no abdominal tenderness.  Musculoskeletal:        General: No edema.  Heberden's node (DIP) and Bouchard's nodes (PIP) in some joints,bilateral. No signs of synovitis. Lymphadenopathy:    She has no cervical adenopathy.  Neurological: She is alert and oriented to person, place, and time. She has normal strength. No cranial nerve deficit. Gait normal.  Skin: Skin is warm. No rash noted. No erythema.No lesions on scalp or alopecia, + thin hair.  Psychiatric: She has a normal mood and affect.  Well groomed, good eye contact.    ASSESSMENT AND PLAN:  Eileen Hansen was seen today for rash.  Diagnoses and all orders for this visit:  Lab Results  Component Value Date   WBC 8.8  06/25/2019   HGB 14.6 06/25/2019   HCT 42.9 06/25/2019   MCV 91.8 06/25/2019   PLT 288.0 06/25/2019   Lab Results  Component Value Date   CREATININE 0.64 06/25/2019   BUN 20 06/25/2019   NA 140 06/25/2019   K 3.9 06/25/2019   CL 101 06/25/2019   CO2 28 06/25/2019   Lab Results  Component Value Date   TSH 2.63 06/25/2019   Lab Results  Component Value Date   CHOL 214 (H) 06/25/2019   HDL 32.70 (L) 06/25/2019   LDLCALC 125 (H) 02/13/2018   LDLDIRECT 152.0 06/25/2019   TRIG 259.0 (H) 06/25/2019   CHOLHDL 7 06/25/2019    Urticaria Today she has no rash. Description suggests urticaria. Possible causes discussed. At this time no further recommendations , if it is recurrent immunology referral will be considered.  -     CBC  Essential hypertension BP adequately controlled. No changes in current management. Continue low salt diet. Eye exam annually. Monitor BP regularly.  -     CBC -     TSH -     Basic Metabolic Panel  Hyperlipidemia, mixed Low fat diet discussed. No changes in current management. Further recommendations according to FLP results.  -     Lipid panel  Generalized osteoarthritis of multiple sites Educated about dx,prognosis,and treatment options. Tylenol 500 mg 3-4 times per day may help.  Hair loss We discussed possible etiologies, ?telogen effluvium. Further recommendations according to lab results. Continue Biotin. Iron is in normal range.  -     TSH -     Ferritin   Return in about 6 months (around 12/24/2019).  Faline Langer G. Martinique, MD  [facility]. [place] office.

## 2019-06-25 NOTE — Patient Instructions (Signed)
A few things to remember from today's visit:   Urticaria - Plan: CBC  Essential hypertension - Plan: CBC, TSH, Basic Metabolic Panel  Hyperlipidemia, mixed - Plan: Lipid panel  Generalized osteoarthritis of multiple sites  Hair loss - Plan: TSH, Ferritin   Please be sure medication list is accurate. If a new problem present, please set up appointment sooner than planned today.

## 2019-06-26 LAB — CBC
HCT: 42.9 % (ref 36.0–46.0)
Hemoglobin: 14.6 g/dL (ref 12.0–15.0)
MCHC: 34.1 g/dL (ref 30.0–36.0)
MCV: 91.8 fl (ref 78.0–100.0)
Platelets: 288 10*3/uL (ref 150.0–400.0)
RBC: 4.67 Mil/uL (ref 3.87–5.11)
RDW: 12.7 % (ref 11.5–15.5)
WBC: 8.8 10*3/uL (ref 4.0–10.5)

## 2019-06-26 LAB — BASIC METABOLIC PANEL
BUN: 20 mg/dL (ref 6–23)
CO2: 28 mEq/L (ref 19–32)
Calcium: 10.1 mg/dL (ref 8.4–10.5)
Chloride: 101 mEq/L (ref 96–112)
Creatinine, Ser: 0.64 mg/dL (ref 0.40–1.20)
GFR: 90.39 mL/min (ref >60.00)
Glucose, Bld: 86 mg/dL (ref 70–99)
Potassium: 3.9 mEq/L (ref 3.5–5.1)
Sodium: 140 mEq/L (ref 135–145)

## 2019-06-26 LAB — LIPID PANEL
Cholesterol: 214 mg/dL — ABNORMAL HIGH (ref 0–200)
HDL: 32.7 mg/dL — ABNORMAL LOW (ref >39.00)
NonHDL: 181.65
Total CHOL/HDL Ratio: 7
Triglycerides: 259 mg/dL — ABNORMAL HIGH (ref 0.0–149.0)
VLDL: 51.8 mg/dL — ABNORMAL HIGH (ref 0.0–40.0)

## 2019-06-26 LAB — LDL CHOLESTEROL, DIRECT: Direct LDL: 152 mg/dL

## 2019-06-26 LAB — FERRITIN: Ferritin: 71 ng/mL (ref 10.0–291.0)

## 2019-06-26 LAB — TSH: TSH: 2.63 u[IU]/mL (ref 0.35–4.50)

## 2019-06-28 ENCOUNTER — Encounter: Payer: Self-pay | Admitting: Family Medicine

## 2019-07-02 ENCOUNTER — Other Ambulatory Visit: Payer: Self-pay | Admitting: Family Medicine

## 2019-07-03 ENCOUNTER — Ambulatory Visit: Payer: Medicare Other | Admitting: Family Medicine

## 2019-07-11 ENCOUNTER — Encounter: Payer: Self-pay | Admitting: Family Medicine

## 2019-07-11 ENCOUNTER — Other Ambulatory Visit: Payer: Self-pay | Admitting: Family Medicine

## 2019-07-11 MED ORDER — ROSUVASTATIN CALCIUM 10 MG PO TABS: 10.0000 mg | ORAL_TABLET | Freq: Every day | ORAL | 1 refills | Status: DC

## 2019-08-07 ENCOUNTER — Ambulatory Visit: Payer: Medicare PPO | Attending: Internal Medicine

## 2019-08-07 DIAGNOSIS — Z23 Encounter for immunization: Secondary | ICD-10-CM | POA: Insufficient documentation

## 2019-08-07 NOTE — Progress Notes (Signed)
   Covid-19 Vaccination Clinic  Name:  Eileen Hansen    MRN: [id number] DOB: 07-09-1946  08/07/2019  Eileen Hansen was observed post Covid-19 immunization for 15 minutes without incidence. She was provided with Vaccine Information Sheet and instruction to access the V-Safe system.   Eileen Hansen was instructed to call 911 with any severe reactions post vaccine: Marland Kitchen Difficulty breathing  . Swelling of your face and throat  . A fast heartbeat  . A bad rash all over your body  . Dizziness and weakness    Immunizations Administered    Name Date Dose VIS Date Route   Pfizer COVID-19 Vaccine 08/07/2019 12:49 PM 0.3 mL 06/29/2019 Intramuscular   Manufacturer: [employer], [employer]   Lot: [id number]   NDC: [address]-7

## 2019-08-25 ENCOUNTER — Ambulatory Visit: Payer: Medicare PPO

## 2019-08-27 ENCOUNTER — Ambulatory Visit: Payer: Medicare PPO | Attending: Internal Medicine

## 2019-08-27 DIAGNOSIS — Z23 Encounter for immunization: Secondary | ICD-10-CM

## 2019-08-27 NOTE — Progress Notes (Signed)
   Covid-19 Vaccination Clinic  Name:  Eileen Hansen    MRN: [id number] DOB: 05-27-1946  08/27/2019  [name] was observed post Covid-19 immunization for 15 minutes without incidence. She was provided with Vaccine Information Sheet and instruction to access the V-Safe system.   [name] was instructed to call 911 with any severe reactions post vaccine: Marland Kitchen Difficulty breathing  . Swelling of your face and throat  . A fast heartbeat  . A bad rash all over your body  . Dizziness and weakness    Immunizations Administered    Name Date Dose VIS Date Route   Pfizer COVID-19 Vaccine 08/27/2019  1:46 PM 0.3 mL 06/29/2019 Intramuscular   Manufacturer: [employer], [employer]   Lot: [id number]   NDC: [address]-9

## 2019-09-23 ENCOUNTER — Other Ambulatory Visit: Payer: Self-pay | Admitting: Family Medicine

## 2019-10-16 ENCOUNTER — Other Ambulatory Visit: Payer: Self-pay

## 2019-10-16 ENCOUNTER — Encounter (HOSPITAL_COMMUNITY): Payer: Self-pay

## 2019-10-16 ENCOUNTER — Emergency Department (HOSPITAL_COMMUNITY)
Admission: EM | Admit: 2019-10-16 | Discharge: 2019-10-17 | Discharge status: Against Medical Advice | Payer: Medicare PPO | Attending: Emergency Medicine | Admitting: Emergency Medicine

## 2019-10-16 DIAGNOSIS — Z5321 Procedure and treatment not carried out due to patient leaving prior to being seen by health care provider: Secondary | ICD-10-CM | POA: Insufficient documentation

## 2019-10-16 DIAGNOSIS — M79661 Pain in right lower leg: Secondary | ICD-10-CM | POA: Diagnosis not present

## 2019-10-16 LAB — BASIC METABOLIC PANEL
Anion gap: 6 (ref 5–15)
BUN: 25 mg/dL — ABNORMAL HIGH (ref 8–23)
CO2: 31 mmol/L (ref 22–32)
Calcium: 10 mg/dL (ref 8.9–10.3)
Chloride: 106 mmol/L (ref 98–111)
Creatinine, Ser: 0.65 mg/dL (ref 0.44–1.00)
GFR calc Af Amer: >60 mL/min (ref >60)
GFR calc non Af Amer: >60 mL/min (ref >60)
Glucose, Bld: 131 mg/dL — ABNORMAL HIGH (ref 70–99)
Potassium: 4.1 mmol/L (ref 3.5–5.1)
Sodium: 143 mmol/L (ref 135–145)

## 2019-10-16 LAB — CBC WITH DIFFERENTIAL/PLATELET
Abs Immature Granulocytes: 0.04 10*3/uL (ref 0.00–0.07)
Basophils Absolute: 0.1 10*3/uL (ref 0.0–0.1)
Basophils Relative: 1 %
Eosinophils Absolute: 0.4 10*3/uL (ref 0.0–0.5)
Eosinophils Relative: 4 %
HCT: 40.8 % (ref 36.0–46.0)
Hemoglobin: 13.6 g/dL (ref 12.0–15.0)
Immature Granulocytes: 1 %
Lymphocytes Relative: 33 %
Lymphs Abs: 2.8 10*3/uL (ref 0.7–4.0)
MCH: 30.5 pg (ref 26.0–34.0)
MCHC: 33.3 g/dL (ref 30.0–36.0)
MCV: 91.5 fL (ref 80.0–100.0)
Monocytes Absolute: 0.8 10*3/uL (ref 0.1–1.0)
Monocytes Relative: 9 %
Neutro Abs: 4.3 10*3/uL (ref 1.7–7.7)
Neutrophils Relative %: 52 %
Platelets: 277 10*3/uL (ref 150–400)
RBC: 4.46 MIL/uL (ref 3.87–5.11)
RDW: 12.3 % (ref 11.5–15.5)
WBC: 8.4 10*3/uL (ref 4.0–10.5)
nRBC: 0 % (ref 0.0–0.2)

## 2019-10-16 LAB — PROTIME-INR
INR: 1 (ref 0.8–1.2)
Prothrombin Time: 13.4 seconds (ref 11.4–15.2)

## 2019-10-16 LAB — APTT: aPTT: 33 seconds (ref 24–36)

## 2019-10-16 NOTE — ED Triage Notes (Signed)
PT came in today POV with c/o of right calf pain. Pain started around 2 weeks and has progressively got worse. No SHOB. Pt denies hx of blood clot, and is not on a blood thinner.

## 2019-10-17 ENCOUNTER — Ambulatory Visit (HOSPITAL_BASED_OUTPATIENT_CLINIC_OR_DEPARTMENT_OTHER)
Admit: 2019-10-17 | Discharge: 2019-10-17 | Disposition: A | Discharge status: Home / Self Care | Payer: Medicare PPO | Attending: Emergency Medicine | Admitting: Emergency Medicine

## 2019-10-17 ENCOUNTER — Other Ambulatory Visit: Payer: Self-pay

## 2019-10-17 ENCOUNTER — Emergency Department (HOSPITAL_BASED_OUTPATIENT_CLINIC_OR_DEPARTMENT_OTHER)
Admission: EM | Admit: 2019-10-17 | Discharge: 2019-10-17 | Disposition: A | Discharge status: Home / Self Care | Payer: Medicare PPO | Source: Home / Self Care | Attending: Emergency Medicine | Admitting: Emergency Medicine

## 2019-10-17 ENCOUNTER — Other Ambulatory Visit (HOSPITAL_BASED_OUTPATIENT_CLINIC_OR_DEPARTMENT_OTHER): Payer: Self-pay | Admitting: Emergency Medicine

## 2019-10-17 ENCOUNTER — Encounter: Payer: Self-pay | Admitting: Family Medicine

## 2019-10-17 ENCOUNTER — Encounter (HOSPITAL_BASED_OUTPATIENT_CLINIC_OR_DEPARTMENT_OTHER): Payer: Self-pay | Admitting: Emergency Medicine

## 2019-10-17 ENCOUNTER — Ambulatory Visit: Payer: Medicare PPO | Admitting: Family Medicine

## 2019-10-17 VITALS — BP 132/70 | HR 63 | Temp 98.0°F | Resp 12 | Ht 60.0 in | Wt 122.6 lb

## 2019-10-17 DIAGNOSIS — I82409 Acute embolism and thrombosis of unspecified deep veins of unspecified lower extremity: Secondary | ICD-10-CM

## 2019-10-17 DIAGNOSIS — E782 Mixed hyperlipidemia: Secondary | ICD-10-CM

## 2019-10-17 DIAGNOSIS — M79661 Pain in right lower leg: Secondary | ICD-10-CM

## 2019-10-17 NOTE — ED Triage Notes (Signed)
Pt /o pain to right calf area x 2 weeks, significantly worse today.

## 2019-10-17 NOTE — Discharge Instructions (Addendum)
You are seen in the ER for pain in your calf. We suspect that you could be having side effect to statin.  Other possibility includes blood clot.  There is no signs of infection, trauma.  We have ordered an ultrasound of your legs to rule out blood clot. Please see your primary care doctor as planned at 7 AM to see if the symptoms could be a result of the statin. Take Tylenol 650 and ibuprofen 400 mg tonight to see if you get some pain relief.

## 2019-10-17 NOTE — Progress Notes (Signed)
ACUTE VISIT     Chief Complaint  Patient presents with  . Leg Pain    right calf  HPI: [name] is a 74 y.o. female, who is here today complaining of 2 weeks of right calf pain, constant, dull pain. Yesterday pain was 9/10, now it has improved, 4/10. She has not had this pain before.  Yesterday pain got suddenly worse. She has not noted leg edema or erythema.  She has not had any trauma or unusual physical activity. No recent surgery, long travel, hormone therapy,or Hx of thrombotic events.  Negative for chest pain, dyspnea, palpitations, diaphoresis, or syncope.  Took Tylenol and ibuprofen last night. No associated fever, chills, body aches, unusual fatigue, changes in appetite, or a skin rash.  She was in the ER last night. She has a right lower extremity venous US scheduled for today.  She had labs done yesterday.  Lab Results  Component Value Date   CREATININE 0.65 10/16/2019   BUN 25 (H) 10/16/2019   NA 143 10/16/2019   K 4.1 10/16/2019   CL 106 10/16/2019   CO2 31 10/16/2019   Lab Results  Component Value Date   WBC 8.4 10/16/2019   HGB 13.6 10/16/2019   HCT 40.8 10/16/2019   MCV 91.5 10/16/2019   PLT 277 10/16/2019   EKG with no significant changes when compared with EKG done in 2018.  Currently she is on Crestor 10 mg daily, it was thought that leg pain could be a side effect of medication. She is following low-fat diet. She is also taking Lovaza 1 g twice daily.  Lab Results  Component Value Date   CHOL 214 (H) 06/25/2019   HDL 32.70 (L) 06/25/2019   LDLCALC 125 (H) 02/13/2018   LDLDIRECT 152.0 06/25/2019   TRIG 259.0 (H) 06/25/2019   CHOLHDL 7 06/25/2019    According to patient, she was told that she has a heart murmur and was asked if she has seen cardiologist in the past.  Review of Systems  Constitutional: Negative for activity change.  HENT: Negative for mouth sores, nosebleeds and sore throat.   Eyes: Negative for  redness and visual disturbance.  Respiratory: Negative for cough and wheezing.   Gastrointestinal: Negative for abdominal pain, nausea and vomiting.       Negative for changes in bowel habits.  Genitourinary: Negative for decreased urine volume, dysuria and hematuria.  Musculoskeletal: Positive for gait problem. Negative for back pain.  Skin: Negative for color change and pallor.  Neurological: Negative for weakness, numbness and headaches.  Rest see pertinent positives and negatives per HPI.  Current Outpatient Medications on File Prior to Visit  Medication Sig Dispense Refill  . albuterol (PROVENTIL HFA;VENTOLIN HFA) 108 (90 Base) MCG/ACT inhaler Inhale 2 puffs into the lungs every 6 (six) hours as needed for wheezing or shortness of breath. 1 Inhaler 0  . Cholecalciferol (VITAMIN D3) 2000 units TABS Take 1 tablet by mouth daily.    . COLLAGEN PO Take by mouth daily.    . hydrochlorothiazide (HYDRODIURIL) 25 MG tablet Take 1 tablet (25 mg total) by mouth daily. 90 tablet 2  . lisinopril (ZESTRIL) 10 MG tablet TAKE TWO TABLETS BY MOUTH DAILY  180 tablet 0  . metoprolol tartrate (LOPRESSOR) 25 MG tablet TAKE HALF TABLET BY MOUTH TWICE DAILY  90 tablet 2  . omega-3 acid ethyl esters (LOVAZA) 1 g capsule Take 1 capsule (1 g total) by mouth 2 (two) times daily.  180 capsule 2  . rosuvastatin (CRESTOR) 10 MG tablet Take 1 tablet (10 mg total) by mouth daily. 90 tablet 1  . aspirin 81 MG tablet Take 81 mg by mouth daily.    . Cyanocobalamin (VITAMIN B-12) 5000 MCG TBDP Take 1 tablet by mouth daily.    . Glucosamine-Chondroit-Vit C-Mn (GLUCOSAMINE-CHONDROITIN) TABS Take by mouth daily.    . Multiple Minerals-Vitamins (CALCIUM CITRATE PLUS PO) Take 2 tablets by mouth daily.    . Multiple Vitamin (MULTI-VITAMINS) TABS Take by mouth.     No current facility-administered medications on file prior to visit.   Past Medical History:  Diagnosis Date  . Allergic rhinitis   . Arthralgia 09/30/2015  .  Bronchitis, acute 01/09/2017  . Chronic shoulder pain 12/07/2016  . Epistaxis 09/30/2015  . Globus sensation 02/23/2015  . Heart murmur 12/07/2016  . HTN (hypertension)   . Left hip pain 09/30/2015  . Medicare annual wellness visit, subsequent 04/14/2015  . Pain in the wrist, unspecified laterality 03/03/2014  . Tachycardia    Episodic   Allergies  Allergen Reactions  . Fenofibrate Other (See Comments)    Abdominal pain  . Penicillins Rash    REACTION: Hives Anaphylaxis    Social History   Socioeconomic History  . Marital status: Married    Spouse name: Not on file  . Number of children: 1  . Years of education: Not on file  . Highest education level: Not on file  Occupational History  . Occupation: Dentist: RETIRED  Tobacco Use  . Smoking status: Never Smoker  . Smokeless tobacco: Never Used  Substance and Sexual Activity  . Alcohol use: Yes    Comment: wine  . Drug use: No  . Sexual activity: Yes    Comment: lives with husband  Other Topics Concern  . Not on file  Social History Narrative   University - [place], Masters Degree - counseling, M[employer]'s [employer]   Married '69 - 7 years, divorced; married '82   21 daughter - '72; 2 grandchildren      Regular Exercise -  YES         Social Determinants of Health   Financial Resource Strain:   . Difficulty of Paying Living Expenses:   Food Insecurity:   . Worried About Programme researcher, broadcasting/film/video in the Last Year:   . Barista in the Last Year:   Transportation Needs:   . Freight forwarder (Medical):   Marland Kitchen Lack of Transportation (Non-Medical):   Physical Activity:   . Days of Exercise per Week:   . Minutes of Exercise per Session:   Stress:   . Feeling of Stress :   Social Connections:   . Frequency of Communication with Friends and Family:   . Frequency of Social Gatherings with Friends and Family:   . Attends Religious Services:   . Active Member of Clubs or Organizations:   . Attends  Banker Meetings:   Marland Kitchen Marital Status:     Vitals:   10/17/19 0711  BP: 132/70  Pulse: 63  Resp: 12  Temp: 98 F (36.7 C)  SpO2: 96%   Body mass index is 23.94 kg/m.  Physical Exam  Nursing note and vitals reviewed. Constitutional: She is oriented to person, place, and time. She appears well-developed and well-nourished. No distress.  HENT:  Head: Normocephalic and atraumatic.  Eyes: Conjunctivae are normal.  Cardiovascular: Normal rate and regular rhythm.  No murmur  heard. Pulses:      Dorsalis pedis pulses are 2+ on the right side and 2+ on the left side.  A few varicose veins in right calf. Homan's test elicits right calf pain. No significant difference in regard to calves diameter.  Respiratory: Effort normal and breath sounds normal. No respiratory distress.  GI: Soft. She exhibits no mass. There is no hepatomegaly. There is no abdominal tenderness.  Musculoskeletal:        General: No edema.     Right ankle:     Right Achilles Tendon: No pain or defects. Thompson's test negative.       Legs:  Neurological: She is alert and oriented to person, place, and time. She has normal strength. No cranial nerve deficit. Gait normal.  Skin: Skin is warm. No rash noted. No erythema.  Psychiatric: She has a normal mood and affect.  Well groomed, good eye contact.   ASSESSMENT AND PLAN:  Ms.Laverta was seen today for leg pain.  Diagnoses and all orders for this visit:  Right calf pain We discussed possible etiologies. ?  Muscle strain, DVT. She will keep appt for leg [place].  We discussed signs and symptoms of DVT, complications, and treatment options. She understands if leg [place] is positive for DVT, she will be placed on oral anticoagulation for at least 3 months. She was clearly instructed about warning signs, in which case she needs to call 911 or be taken to the ER.   Hyperlipidemia, mixed I am not sure above problem is caused by statin medication,pain is very  localized, but if DVT is negative we could consider holding Crestor for 3 to 4 weeks. Continue low-fat diet. For now no changes in current management. We will plan on checking lipid panel next visit.   In regard to her murmur, I have not heard one before nor today. We reviewed EKG done in the ER, no significant changes when compared with prior EKGs.   Return for Keep next appt.   Keyasia Jolliff G. Martin[place], MD  [facility]. [place] office.   A few things to remember from today's visit: Could be muscle related but we need to be sure it is not a clot. Continue statin for now. Rest and elevation of leg until we get result of leg ultrasound. If chest pain/discomfort, difficulty breathing, palpitations, wheezing,leg swelling/redness you need to seek immediate medical attention. Please keep next appt.

## 2019-10-17 NOTE — Patient Instructions (Addendum)
A few things to remember from today's visit:   Could be muscle related but we need to be sure it is not a clot. Continue statin for now. Rest and elevation of leg until we get result of leg ultrasound. If chest pain/discomfort, difficulty breathing, palpitations, wheezing,leg swelling/redness you need to seek immediate medical attention. Please keep next appt.

## 2019-10-17 NOTE — ED Provider Notes (Signed)
[facility] EMERGENCY DEPARTMENT Provider Note   CSN: [id number] Arrival date & time: 10/17/19  0107     History Chief Complaint  Patient presents with  . Leg Pain    Eileen Hansen is a 74 y.o. female.  HPI    74 year old female comes in a chief complaint of leg pain.  Patient has history of hypertension, and she is on statins.  Patient reports starting to have some right lower extremity discomfort for the last couple of weeks.  Today she started having severe calf tenderness.  She has worsening of her discomfort anytime she tries to walk.  She has no history of DVT, PE and denies any risk factors for it.  She also denies any trauma.   Past Medical History:  Diagnosis Date  . Allergic rhinitis   . Arthralgia 09/30/2015  . Bronchitis, acute 01/09/2017  . Chronic shoulder pain 12/07/2016  . Epistaxis 09/30/2015  . Globus sensation 02/23/2015  . Heart murmur 12/07/2016  . HTN (hypertension)   . Left hip pain 09/30/2015  . Medicare annual wellness visit, subsequent 04/14/2015  . Pain in the wrist, unspecified laterality 03/03/2014  . Tachycardia    Episodic    Patient Active Problem List   Diagnosis Date Noted  . RUQ pain 07/20/2018  . Influenza A 07/18/2018  . Senile ecchymosis 02/03/2018  . Atypical chest pain 04/17/2017  . Bronchitis, acute 01/09/2017  . Heart murmur 12/07/2016  . Chronic shoulder pain 12/07/2016  . Epistaxis 09/30/2015  . 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 Left arm pain 09/30/2015  . Left hip pain 09/30/2015  . Generalized osteoarthritis of multiple sites 09/30/2015  . Medicare annual wellness visit, subsequent 04/14/2015  . Globus sensation 02/23/2015  . Osteopenia 04/15/2014  . Prediabetes 04/07/2014  . Pain in the wrist, unspecified laterality 03/03/2014  . Paresthesia of left arm 11/16/2013  . Lateral epicondylitis of left elbow 04/19/2013  . Hematochezia 04/19/2013  . DDD (degenerative disc disease), cervical 05/16/2012  .  Preventative health care 03/31/2011  . JOINT PAIN, HAND 07/23/2010  . DISTURBANCE OF SKIN SENSATION 07/04/2009  . Hyperlipidemia, mixed 07/04/2009  . Constipation 12/02/2008  . TENESMUS 12/02/2008  . Tachycardia 11/07/2007  . Essential hypertension 06/01/2007  . ALLERGIC RHINITIS 06/01/2007    Past Surgical History:  Procedure Laterality Date  . APPENDECTOMY  74 yrs old  . BUNIONECTOMY    . WISDOM TOOTH EXTRACTION  74 yrs old     OB History    Gravida  1   Para  1   Term      Preterm      AB      Living        SAB      TAB      Ectopic      Multiple      Live Births              Family History  Problem Relation Age of Onset  . Cancer Father        melanoma  . Diabetes Mother   . Hypertension Mother   . Hyperlipidemia Mother   . Stroke Mother   . Heart attack Mother   . Heart attack Brother   . Cancer Brother        pancreatic  . Colon cancer Neg Hx   . Breast cancer Neg Hx   . Coronary artery disease Neg Hx     Social History   Tobacco Use  .  Smoking status: Never Smoker  . Smokeless tobacco: Never Used  Substance Use Topics  . Alcohol use: Yes    Comment: wine  . Drug use: No    Home Medications Prior to Admission medications   Medication Sig Start Date End Date Taking? Authorizing Provider  albuterol (PROVENTIL HFA;VENTOLIN HFA) 108 (90 Base) MCG/ACT inhaler Inhale 2 puffs into the lungs every 6 (six) hours as needed for wheezing or shortness of breath. 07/18/18   Mosie Lukes, MD  aspirin 81 MG tablet Take 81 mg by mouth daily.    [provider]  Cholecalciferol (VITAMIN D3) 2000 units TABS Take 1 tablet by mouth daily.    [provider]  COLLAGEN PO Take by mouth daily.    [provider]  Cyanocobalamin (VITAMIN B-12) 5000 MCG TBDP Take 1 tablet by mouth daily.    [provider]  Glucosamine-Chondroit-Vit C-Mn (GLUCOSAMINE-CHONDROITIN) TABS Take by mouth daily.    [provider]   hydrochlorothiazide (HYDRODIURIL) 25 MG tablet Take 1 tablet (25 mg total) by mouth daily. 03/06/19   [place], [name], MD  lisinopril (ZESTRIL) 10 MG tablet TAKE TWO TABLETS BY MOUTH DAILY  07/02/19   [place], [name], MD  metoprolol tartrate (LOPRESSOR) 25 MG tablet TAKE HALF TABLET BY MOUTH TWICE DAILY  09/24/19   [place], [name], MD  Multiple Minerals-Vitamins (CALCIUM CITRATE PLUS PO) Take 2 tablets by mouth daily.    [provider]  Multiple Vitamin (MULTI-VITAMINS) TABS Take by mouth.    [provider]  omega-3 acid ethyl esters (LOVAZA) 1 g capsule Take 1 capsule (1 g total) by mouth 2 (two) times daily. 03/07/19 06/05/19  [place], [name], MD  Omega-3 Fatty Acids (FISH OIL) 1000 MG CAPS Take 1,000 capsules by mouth daily.    [provider]  pravastatin (PRAVACHOL) 20 MG tablet Take 1 tablet (20 mg total) by mouth daily. 02/15/18   [place], [name], MD  rosuvastatin (CRESTOR) 10 MG tablet Take 1 tablet (10 mg total) by mouth daily. 07/11/19   [place], [name], MD    Allergies    Fenofibrate and Penicillins  Review of Systems   Review of Systems  Constitutional: Positive for activity change.  Respiratory: Negative for shortness of breath.   Cardiovascular: Negative for chest pain and leg swelling.  Musculoskeletal: Positive for myalgias.    Physical Exam Updated Vital Signs BP (!) 176/78 (BP Location: Right Arm)   Pulse 62   Temp 98.2 F (36.8 C) (Oral)   Resp 18   Ht 5' (1.524 m)   Wt 54.4 kg   SpO2 97%   BMI 23.44 kg/m   Physical Exam Vitals and nursing note reviewed.  Constitutional:      Appearance: She is well-developed.  HENT:     Head: Normocephalic and atraumatic.  Cardiovascular:     Rate and Rhythm: Normal rate.     Pulses: Normal pulses.  Pulmonary:     Effort: Pulmonary effort is normal.  Abdominal:     General: Bowel sounds are normal.  Musculoskeletal:        General: Tenderness present. No swelling or deformity.      Cervical back: Normal range of motion and neck supple.     Right lower leg: No edema.     Left lower leg: No edema.     Comments: Patient has right calf tenderness to palpation. No rubor, calor.  Skin:    General: Skin is warm and dry.  Neurological:     Mental Status: She is alert and oriented to person, place, and time.     ED Results / Procedures / Treatments   Labs (all labs ordered are listed, but only abnormal results are displayed) Labs Reviewed - No data to display  EKG None  Radiology No results found.  Procedures Procedures (including critical care time)  Medications Ordered in ED Medications - No data to display  ED Course  I have reviewed the triage vital signs and the nursing notes.  Pertinent labs & imaging results that were available during my care of the patient were reviewed by me and considered in my medical decision making (see chart for details).    MDM Rules/Calculators/A&P                      74 year old comes in a chief complaint of leg pain.  She has had right-sided leg discomfort for the last few days, today she started noticing severe calf discomfort that is worse with ambulation.  On exam she has no signs of DVT.  However she with calf tenderness and positive Denna Haggard' sign she will need ultrasound DVT.  We do not see any signs of infection, trauma and she has intact and normal distal pulses.  She has no back pain.  Plan is to get ultrasound DVT and if negative then to look at other causes for myalgia as such as statin side effect, tendinopathy etc.  Final Clinical Impression(s) / ED Diagnoses Final diagnoses:  Right calf pain    Rx / DC Orders ED Discharge Orders         Ordered    VAS [place] LOWER EXTREMITY VENOUS (DVT)  Status:  Canceled     10/17/19 0146    VAS [place] LOWER EXTREMITY VENOUS (DVT)     10/17/19 0146           Derwood Kaplan, MD 10/17/19 [id number]

## 2019-10-17 NOTE — ED Triage Notes (Signed)
Pt had blood work done at [facility] but did not wait to be seen.

## 2019-10-19 ENCOUNTER — Other Ambulatory Visit: Payer: Self-pay | Admitting: Family Medicine

## 2019-10-23 ENCOUNTER — Other Ambulatory Visit: Payer: Self-pay

## 2019-10-24 ENCOUNTER — Ambulatory Visit: Payer: Medicare PPO | Admitting: Family Medicine

## 2019-10-24 ENCOUNTER — Encounter: Payer: Self-pay | Admitting: Family Medicine

## 2019-10-24 VITALS — BP 124/70 | HR 61 | Temp 98.1°F | Resp 12 | Ht 60.0 in | Wt 120.8 lb

## 2019-10-24 DIAGNOSIS — R7303 Prediabetes: Secondary | ICD-10-CM

## 2019-10-24 DIAGNOSIS — M79661 Pain in right lower leg: Secondary | ICD-10-CM

## 2019-10-24 DIAGNOSIS — M542 Cervicalgia: Secondary | ICD-10-CM

## 2019-10-24 DIAGNOSIS — E782 Mixed hyperlipidemia: Secondary | ICD-10-CM | POA: Diagnosis not present

## 2019-10-24 DIAGNOSIS — I10 Essential (primary) hypertension: Secondary | ICD-10-CM | POA: Diagnosis not present

## 2019-10-24 LAB — LIPID PANEL
Cholesterol: 123 mg/dL (ref 0–200)
HDL: 31.7 mg/dL — ABNORMAL LOW (ref >39.00)
LDL Cholesterol: 61 mg/dL (ref 0–99)
NonHDL: 90.9
Total CHOL/HDL Ratio: 4
Triglycerides: 151 mg/dL — ABNORMAL HIGH (ref 0.0–149.0)
VLDL: 30.2 mg/dL (ref 0.0–40.0)

## 2019-10-24 LAB — HEPATIC FUNCTION PANEL
ALT: 20 U/L (ref 0–35)
AST: 20 U/L (ref 0–37)
Albumin: 4.8 g/dL (ref 3.5–5.2)
Alkaline Phosphatase: 53 U/L (ref 39–117)
Bilirubin, Direct: 0.1 mg/dL (ref 0.0–0.3)
Total Bilirubin: 0.6 mg/dL (ref 0.2–1.2)
Total Protein: 7.4 g/dL (ref 6.0–8.3)

## 2019-10-24 LAB — HEMOGLOBIN A1C: Hgb A1c MFr Bld: 6.1 % (ref 4.6–6.5)

## 2019-10-24 LAB — CK: Total CK: 27 U/L (ref 7–177)

## 2019-10-24 MED ORDER — MELOXICAM 15 MG PO TABS: 15.0000 mg | ORAL_TABLET | Freq: Every day | ORAL | 0 refills | Status: DC

## 2019-10-24 NOTE — Patient Instructions (Addendum)
A few things to remember from today's visit:    Essential hypertension  Hyperlipidemia, mixed - Plan: Lipid panel, Hepatic function panel  Prediabetes - Plan: Hemoglobin A1c  Right calf pain - Plan: meloxicam (MOBIC) 15 MG tablet, Ambulatory referral to Sports Medicine   Appt with sport medicine will be arranged. Meloxicam para el dolor, no tome Ibuprofen. Local heat and cold alternando. No cambios en medicamentos hoy.   Please be sure medication list is accurate. If a new problem present, please set up appointment sooner than planned today.

## 2019-10-24 NOTE — Progress Notes (Signed)
HPI:  Eileen Hansen is a 74 y.o. female, who is here today for 6 months follow up.   She was last seen on 10/17/19 due to right calf pain. LE vein [place] negative for DVT. Pain has improved some but not resolved. 3 weeks of dull,constant pain, 6/10. Negative for edema or erythema. No known trauma.  Ibuprofen irritates her stomach.  Mentions that she has episodes of occipital scalp,intermitent stubbing pain ,sudden onset and last a min or so. No sure about exacerbating or alleviating factors. Left-sided neck pain, no radiated to UE. Hx of shoulder pain. No numbness,tingling,weakness. No limitation of ROM.  WNU:[id number] on HCTZ 25 mg daily, Lisinopril 10 mg daily,and Metoprolol tartrate 25 mg bid. Negative for severe/frequent headache, visual changes, chest pain, dyspnea, palpitation, or focal weakness.  Lab Results  Component Value Date   CREATININE 0.65 10/16/2019   BUN 25 (H) 10/16/2019   NA 143 10/16/2019   K 4.1 10/16/2019   CL 106 10/16/2019   CO2 31 10/16/2019   IHK:VQQVZDGLO she is on Crestor 10 mg daily and Lovaza 1 g bid. She also follows low fat diet. Tolerating medication well.  Lab Results  Component Value Date   CHOL 214 (H) 06/25/2019   HDL 32.70 (L) 06/25/2019   LDLCALC 125 (H) 02/13/2018   LDLDIRECT 152.0 06/25/2019   TRIG 259.0 (H) 06/25/2019   CHOLHDL 7 06/25/2019   Lab Results  Component Value Date   ALT 11 01/10/2019   AST 16 01/10/2019   ALKPHOS 60 01/10/2019   BILITOT 0.5 01/10/2019   Prediabetes: Negative for polydipsia,polyuria, or polyphagia.  Lab Results  Component Value Date   HGBA1C 5.9 01/10/2019    Review of Systems  Constitutional: Negative for activity change, appetite change, fatigue and fever.  HENT: Negative for mouth sores, nosebleeds and sore throat.   Respiratory: Negative for cough and wheezing.   Gastrointestinal: Negative for abdominal pain, nausea and vomiting.       Negative for changes in bowel habits.   Genitourinary: Negative for decreased urine volume and hematuria.  Musculoskeletal: Positive for arthralgias. Negative for gait problem.  Skin: Negative for rash and wound.  Neurological: Negative for syncope and facial asymmetry.  Rest of ROS, see pertinent positives sand negatives in HPI  Current Outpatient Medications on File Prior to Visit  Medication Sig Dispense Refill  . albuterol (PROVENTIL HFA;VENTOLIN HFA) 108 (90 Base) MCG/ACT inhaler Inhale 2 puffs into the lungs every 6 (six) hours as needed for wheezing or shortness of breath. 1 Inhaler 0  . aspirin 81 MG tablet Take 81 mg by mouth daily.    . Cholecalciferol (VITAMIN D3) 2000 units TABS Take 1 tablet by mouth daily.    . COLLAGEN PO Take by mouth daily.    . Cyanocobalamin (VITAMIN B-12) 5000 MCG TBDP Take 1 tablet by mouth daily.    . hydrochlorothiazide (HYDRODIURIL) 25 MG tablet Take 1 tablet (25 mg total) by mouth daily. 90 tablet 2  . lisinopril (ZESTRIL) 10 MG tablet TAKE TWO TABLETS BY MOUTH DAILY  180 tablet 0  . metoprolol tartrate (LOPRESSOR) 25 MG tablet TAKE HALF TABLET BY MOUTH TWICE DAILY  90 tablet 2  . Multiple Minerals-Vitamins (CALCIUM CITRATE PLUS PO) Take 2 tablets by mouth daily.    . Multiple Vitamin (MULTI-VITAMINS) TABS Take by mouth.    . rosuvastatin (CRESTOR) 10 MG tablet Take 1 tablet (10 mg total) by mouth daily. 90 tablet 1  . omega-3 acid  ethyl esters (LOVAZA) 1 g capsule Take 1 capsule (1 g total) by mouth 2 (two) times daily. 180 capsule 2   No current facility-administered medications on file prior to visit.    Past Medical History:  Diagnosis Date  . Allergic rhinitis   . Arthralgia 09/30/2015  . Bronchitis, acute 01/09/2017  . Chronic shoulder pain 12/07/2016  . Epistaxis 09/30/2015  . Globus sensation 02/23/2015  . Heart murmur 12/07/2016  . HTN (hypertension)   . Left hip pain 09/30/2015  . Medicare annual wellness visit, subsequent 04/14/2015  . Pain in the wrist, unspecified  laterality 03/03/2014  . Tachycardia    Episodic   Allergies  Allergen Reactions  . Fenofibrate Other (See Comments)    Abdominal pain  . Penicillins Rash    REACTION: Hives Anaphylaxis    Social History   Socioeconomic History  . Marital status: Married    Spouse name: Not on file  . Number of children: 1  . Years of education: Not on file  . Highest education level: Not on file  Occupational History  . Occupation: Dentist: RETIRED  Tobacco Use  . Smoking status: Never Smoker  . Smokeless tobacco: Never Used  Substance and Sexual Activity  . Alcohol use: Yes    Comment: wine  . Drug use: No  . Sexual activity: Yes    Comment: lives with husband  Other Topics Concern  . Not on file  Social History Narrative   University - [place], Masters Degree - counseling, [employer]   Married '69 - 7 years, divorced; married '82   21 daughter - '72; 2 grandchildren      Regular Exercise -  YES         Social Determinants of Health   Financial Resource Strain:   . Difficulty of Paying Living Expenses:   Food Insecurity:   . Worried About Programme researcher, broadcasting/film/video in the Last Year:   . Barista in the Last Year:   Transportation Needs:   . Freight forwarder (Medical):   Marland Kitchen Lack of Transportation (Non-Medical):   Physical Activity:   . Days of Exercise per Week:   . Minutes of Exercise per Session:   Stress:   . Feeling of Stress :   Social Connections:   . Frequency of Communication with Friends and Family:   . Frequency of Social Gatherings with Friends and Family:   . Attends Religious Services:   . Active Member of Clubs or Organizations:   . Attends Banker Meetings:   Marland Kitchen Marital Status:     Vitals:   10/24/19 1209  BP: 124/70  Pulse: 61  Resp: 12  Temp: 98.1 F (36.7 C)  SpO2: 98%   Body mass index is 23.59 kg/m.  Physical Exam  Nursing note and vitals reviewed. Constitutional: She is oriented to  person, place, and time. She appears well-developed. No distress.  HENT:  Head: Normocephalic and atraumatic.  Mouth/Throat: Oropharynx is clear and moist and mucous membranes are normal.  Eyes: Pupils are equal, round, and reactive to light. Conjunctivae are normal.  Cardiovascular: Normal rate and regular rhythm.  No murmur heard. Pulses:      Dorsalis pedis pulses are 2+ on the right side and 2+ on the left side.  Respiratory: Effort normal and breath sounds normal. No respiratory distress.  GI: Soft. She exhibits no mass. There is no hepatomegaly. There is no abdominal tenderness.  Musculoskeletal:  General: No edema.     Cervical back: No tenderness or bony tenderness. Normal range of motion.     Right lower leg: Tenderness present. No edema.  Lymphadenopathy:    She has no cervical adenopathy.  Neurological: She is alert and oriented to person, place, and time. She has normal strength. No cranial nerve deficit. Gait normal.  Skin: Skin is warm. No rash noted. No erythema.  Psychiatric: She has a normal mood and affect.  Well groomed, good eye contact.    ASSESSMENT AND PLAN:  Eileen Hansen was seen today for 6 months follow-up.  Orders Placed This Encounter  Procedures  . Hemoglobin A1c  . Lipid panel  . Hepatic function panel  . CK  . Ambulatory referral to Sports Medicine   Lab Results  Component Value Date   HGBA1C 6.1 10/24/2019   Lab Results  Component Value Date   CHOL 123 10/24/2019   HDL 31.70 (L) 10/24/2019   LDLCALC 61 10/24/2019   LDLDIRECT 152.0 06/25/2019   TRIG 151.0 (H) 10/24/2019   CHOLHDL 4 10/24/2019   Lab Results  Component Value Date   ALT 20 10/24/2019   AST 20 10/24/2019   ALKPHOS 53 10/24/2019   BILITOT 0.6 10/24/2019    1. Right calf pain RLE negative for DVT. It seems to be musculoskeletal, strain. Some side effects of NSAID's discussed, Meloxicam may be better tolerated. Local heat and cold,alternating. Referral to  sport med placed. - meloxicam (MOBIC) 15 MG tablet; Take 1 tablet (15 mg total) by mouth daily.  Dispense: 30 tablet; Refill: 0  2. Prediabetes Continue healthy life style for primary prevention. Further recommendations according to HgA1C.  3. Essential hypertension BP adequately controlled. No changes in current management. Continue low salt diet. Monitor BP regularly.  4. Hyperlipidemia, mixed No changes in current management, will follow labs done today and will give further recommendations accordingly.  5. Cervicalgia I do not think imaging is needed today. Most likely related to DDD. ROM exercises.  Return in about 5 months (around 03/25/2020) for awv and f/u.   [name]. [name], MD  [facility] [place] office.  A few things to remember from today's visit:  Appt with sport medicine will be arranged. Meloxicam para el dolor, no tome Ibuprofen. Local heat and cold alternando. No cambios en medicamentos hoy.   Please be sure medication list is accurate. If a new problem present, please set up appointment sooner than planned today.

## 2019-10-27 ENCOUNTER — Encounter: Payer: Self-pay | Admitting: Family Medicine

## 2019-10-27 MED ORDER — OMEGA-3-ACID ETHYL ESTERS 1 G PO CAPS: 1.0000 g | ORAL_CAPSULE | Freq: Two times a day (BID) | ORAL | 2 refills | Status: DC

## 2019-10-29 ENCOUNTER — Other Ambulatory Visit: Payer: Self-pay

## 2019-10-29 ENCOUNTER — Encounter: Payer: Self-pay | Admitting: Family Medicine

## 2019-10-29 ENCOUNTER — Ambulatory Visit: Payer: Medicare PPO | Admitting: Family Medicine

## 2019-10-29 ENCOUNTER — Ambulatory Visit (INDEPENDENT_AMBULATORY_CARE_PROVIDER_SITE_OTHER): Payer: Medicare PPO

## 2019-10-29 VITALS — BP 134/68 | HR 71 | Ht 60.0 in | Wt 122.2 lb

## 2019-10-29 DIAGNOSIS — M5431 Sciatica, right side: Secondary | ICD-10-CM

## 2019-10-29 DIAGNOSIS — M47816 Spondylosis without myelopathy or radiculopathy, lumbar region: Secondary | ICD-10-CM | POA: Diagnosis not present

## 2019-10-29 MED ORDER — PREDNISONE 10 MG PO TABS: 30.0000 mg | ORAL_TABLET | Freq: Every day | ORAL | 0 refills | Status: DC

## 2019-10-29 MED ORDER — GABAPENTIN 300 MG PO CAPS: 300.0000 mg | ORAL_CAPSULE | Freq: Three times a day (TID) | ORAL | 1 refills | Status: DC | PRN

## 2019-10-29 NOTE — Progress Notes (Signed)
Subjective:    CC: R calf pain  I, Eileen Hansen, LAT, ATC, am serving as scribe for Dr. Clementeen Graham.  HPI: Pt is a 74 y/o female presenting w/ c/o R calf pain x 2 weeks (10/16/19).  She was seen at [facility] ED on 10/17/19 and had a R LE venous doppler that was neg for DVT.  Since her visit to the ED, she is now having pain in her whole R LE, particularly in the posterior leg.  She rates her pain at a 6/10 and describes her pain as dull.  Radiating pain: Yes in the posterior R LE Calf swelling: No Aggravating factors: active R LE motion, particularly w/ R knee flexion Treatments tried: was prescribed Meloxicam but not taking  Diagnostic imaging:  R LE Venous doppler- 10/17/19  Pertinent review of Systems: No fevers or chills  Relevant historical information: No history lumbosacral radiculopathy.   Objective:    Vitals:   10/29/19 1243  BP: 134/68  Pulse: 71  SpO2: 97%   General: Well Developed, well nourished, and in no acute distress.   MSK:  L-spine: Nontender to midline.  Decreased lumbar motion. Lower extremity strength is intact with exception of right ankle plantarflexion which is diminished 4/5. Reflexes and sensation intact and equal distally. Normal gait. Positive right-sided slump test.  Right knee normal-appearing nontender normal motion with crepitation.  Stable ligamentous exam.    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 Await formal radiology review  Impression and Recommendations:    Assessment and Plan: 74 y.o. female with right posterior leg pain ongoing for about 2 weeks.  Fortunately patient already had excellent work-up with PCP and negative DVT ultrasound test. Based on pain radiating down the entire posterior leg worse with provocative maneuvers I think the most likely  condition at this point is lumbosacral radiculopathy likely right S1 nerve root.  X-ray today does show degenerative changes at this level.  Plan for conservative management with physical therapy, short burst of prednisone, and trial of gabapentin.  Check back in 4 weeks.  If worsening or not improving neck step would be MRI for epidural steroid injection planning.  Stressed the importance of watching for weakness with patient.Marland Kitchen  PDMP not reviewed this encounter. Orders Placed This Encounter  Procedures  . DG Lumbar Spine 2-3 Views    Standing Status:   Future    Number of Occurrences:   1    Standing Expiration Date:   12/28/2020    Order Specific Question:   Reason for Exam (SYMPTOM  OR DIAGNOSIS REQUIRED)    Answer:   eval poss right Sciatica    Order Specific Question:   Preferred imaging location?    Answer:   [facility]    Order Specific Question:   Radiology Contrast Protocol - do NOT remove file path    Answer:   [email or web address]  . Ambulatory referral to Physical Therapy    Referral Priority:   Routine    Referral Type:   Physical Medicine    Referral Reason:   Specialty Services Required    Requested Specialty:   Physical Therapy   Meds ordered this encounter  Medications  . gabapentin (NEURONTIN) 300 MG capsule    Sig: Take 1 capsule (300 mg total) by mouth 3 (three) times daily as needed (nerve pain). Take mostly  at bedtime    Dispense:  90 capsule    Refill:  1  . predniSONE (DELTASONE) 10 MG tablet    Sig: Take 3 tablets (30 mg total) by mouth daily with breakfast.    Dispense:  15 tablet    Refill:  0    Discussed warning signs or symptoms. Please see discharge instructions. Patient expresses understanding.   The above documentation has been reviewed and is accurate and complete Lynne Leader

## 2019-10-29 NOTE — Patient Instructions (Signed)
Thank you for coming in today. I think this is a pinched nerve in the back causing leg pain.  The name for this is sciatica.   Plan for physical therapy.  Take the short course of prednisone. You can stop it early or reduce the dose if you want to.   Take gabapentin as needed for nerve pain.  It is ok to take tylenol or ibuprofen with this medicine if it helps.   Get xray today.   Let me know if you are getting worse.  Otherwise recheck in 4 weeks.    Sciatica  Sciatica is pain, numbness, weakness, or tingling along the path of the sciatic nerve. The sciatic nerve starts in the lower back and runs down the back of each leg. The nerve controls the muscles in the lower leg and in the back of the knee. It also provides feeling (sensation) to the back of the thigh, the lower leg, and the sole of the foot. Sciatica is a symptom of another medical condition that pinches or puts pressure on the sciatic nerve. Sciatica most often only affects one side of the body. Sciatica usually goes away on its own or with treatment. In some cases, sciatica may come back (recur). What are the causes? This condition is caused by pressure on the sciatic nerve or pinching of the nerve. This may be the result of:  A disk in between the bones of the spine bulging out too far (herniated disk).  Age-related changes in the spinal disks.  A pain disorder that affects a muscle in the buttock.  Extra bone growth near the sciatic nerve.  A break (fracture) of the pelvis.  Pregnancy.  Tumor. This is rare. What increases the risk? The following factors may make you more likely to develop this condition:  Playing sports that place pressure or stress on the spine.  Having poor strength and flexibility.  A history of back injury or surgery.  Sitting for long periods of time.  Doing activities that involve repetitive bending or lifting.  Obesity. What are the signs or symptoms? Symptoms can vary from mild  to very severe, and they may include:  Any of these problems in the lower back, leg, hip, or buttock: ? Mild tingling, numbness, or dull aches. ? Burning sensations. ? Sharp pains.  Numbness in the back of the calf or the sole of the foot.  Leg weakness.  Severe back pain that makes movement difficult. Symptoms may get worse when you cough, sneeze, or laugh, or when you sit or stand for long periods of time. How is this diagnosed? This condition may be diagnosed based on:  Your symptoms and medical history.  A physical exam.  Blood tests.  Imaging tests, such as: ? X-rays. ? MRI. ? CT scan. How is this treated? In many cases, this condition improves on its own without treatment. However, treatment may include:  Reducing or modifying physical activity.  Exercising and stretching.  Icing and applying heat to the affected area.  Medicines that help to: ? Relieve pain and swelling. ? Relax your muscles.  Injections of medicines that help to relieve pain, irritation, and inflammation around the sciatic nerve (steroids).  Surgery. Follow these instructions at home: Medicines  Take over-the-counter and prescription medicines only as told by your health care provider.  Ask your health care provider if the medicine prescribed to you: ? Requires you to avoid driving or using heavy machinery. ? Can cause constipation. You may need  to take these actions to prevent or treat constipation:  Drink enough fluid to keep your urine pale yellow.  Take over-the-counter or prescription medicines.  Eat foods that are high in fiber, such as beans, whole grains, and fresh fruits and vegetables.  Limit foods that are high in fat and processed sugars, such as fried or sweet foods. Managing pain      If directed, put ice on the affected area. ? Put ice in a plastic bag. ? Place a towel between your skin and the bag. ? Leave the ice on for 20 minutes, 2-3 times a day.  If  directed, apply heat to the affected area. Use the heat source that your health care provider recommends, such as a moist heat pack or a heating pad. ? Place a towel between your skin and the heat source. ? Leave the heat on for 20-30 minutes. ? Remove the heat if your skin turns bright red. This is especially important if you are unable to feel pain, heat, or cold. You may have a greater risk of getting burned. Activity   Return to your normal activities as told by your health care provider. Ask your health care provider what activities are safe for you.  Avoid activities that make your symptoms worse.  Take brief periods of rest throughout the day. ? When you rest for longer periods, mix in some mild activity or stretching between periods of rest. This will help to prevent stiffness and pain. ? Avoid sitting for long periods of time without moving. Get up and move around at least one time each hour.  Exercise and stretch regularly, as told by your health care provider.  Do not lift anything that is heavier than 10 lb (4.5 kg) while you have symptoms of sciatica. When you do not have symptoms, you should still avoid heavy lifting, especially repetitive heavy lifting.  When you lift objects, always use proper lifting technique, which includes: ? Bending your knees. ? Keeping the load close to your body. ? Avoiding twisting. General instructions  Maintain a healthy weight. Excess weight puts extra stress on your back.  Wear supportive, comfortable shoes. Avoid wearing high heels.  Avoid sleeping on a mattress that is too soft or too hard. A mattress that is firm enough to support your back when you sleep may help to reduce your pain.  Keep all follow-up visits as told by your health care provider. This is important. Contact a health care provider if:  You have pain that: ? Wakes you up when you are sleeping. ? Gets worse when you lie down. ? Is worse than you have experienced in  the past. ? Lasts longer than 4 weeks.  You have an unexplained weight loss. Get help right away if:  You are not able to control when you urinate or have bowel movements (incontinence).  You have: ? Weakness in your lower back, pelvis, buttocks, or legs that gets worse. ? Redness or swelling of your back. ? A burning sensation when you urinate. Summary  Sciatica is pain, numbness, weakness, or tingling along the path of the sciatic nerve.  This condition is caused by pressure on the sciatic nerve or pinching of the nerve.  Sciatica can cause pain, numbness, or tingling in the lower back, legs, hips, and buttocks.  Treatment often includes rest, exercise, medicines, and applying ice or heat. This information is not intended to replace advice given to you by your health care provider. Make sure you  discuss any questions you have with your health care provider. Document Revised: 07/24/2018 Document Reviewed: 07/24/2018 Elsevier Patient Education  2020 [employer].

## 2019-10-30 ENCOUNTER — Encounter: Payer: Self-pay | Admitting: Family Medicine

## 2019-10-30 NOTE — Progress Notes (Signed)
X-ray lumbar spine shows arthritis in the low back and a little bit of scoliosis.  If needed MRI will be very helpful here.

## 2019-10-31 ENCOUNTER — Encounter: Payer: Self-pay | Admitting: Family Medicine

## 2019-10-31 ENCOUNTER — Telehealth (INDEPENDENT_AMBULATORY_CARE_PROVIDER_SITE_OTHER): Payer: Medicare PPO | Admitting: Family Medicine

## 2019-10-31 VITALS — Ht 60.0 in

## 2019-10-31 DIAGNOSIS — M5431 Sciatica, right side: Secondary | ICD-10-CM

## 2019-10-31 DIAGNOSIS — M79661 Pain in right lower leg: Secondary | ICD-10-CM | POA: Diagnosis not present

## 2019-10-31 NOTE — Progress Notes (Signed)
Virtual Visit via Video Note   I connected with Eileen Hansen on 10/31/19 by a video enabled telemedicine application and verified that I am speaking with the correct person using two identifiers.  Location patient: home Location provider:work office Persons participating in the virtual visit: patient, provider  I discussed the limitations of evaluation and management by telemedicine and the availability of in person appointments. The patient expressed understanding and agreed to proceed.   HPI: Eileen Hansen is a 74 year old female who would like to discuss some concerns about medication side effects. She was last seen on 10/24/19 to follow on right calf pain. Problem started about 4 weeks ago. Gradual onset. No edema or erythema. No hx of trauma.  She had LE vascular US negative for DVT on 10/17/19.  Pain is otherwise stable, sometimes radiated to mid thigh and lower aspect of calf. No numbness, tingling, burning, or needlelike sensation. She has not noted LE weakness.  Recently evaluated for Dr.Corey, Diagnosed with sciatic pain. She was recommended diagnosed with sciatica. Oral prednisone and PT were recommended.  She is concerned about possible side effects of prednisone, she has read some online.  She is still concerned about having a "blood clot" or pain be caused by statin side effects.  Lumbar X ray done on There is significant disc space narrowing at L5/S1, with prominent facet hypertrophy at L4/L5 and L5/S1. No fractures. Sacroiliac joints are normal.   ROS: See pertinent positives and negatives per HPI.  Past Medical History:  Diagnosis Date  . Allergic rhinitis   . Arthralgia 09/30/2015  . Bronchitis, acute 01/09/2017  . Chronic shoulder pain 12/07/2016  . Epistaxis 09/30/2015  . Globus sensation 02/23/2015  . Heart murmur 12/07/2016  . HTN (hypertension)   . Left hip pain 09/30/2015  . Medicare annual wellness visit, subsequent 04/14/2015  . Pain in the wrist, unspecified  laterality 03/03/2014  . Tachycardia    Episodic    Past Surgical History:  Procedure Laterality Date  . APPENDECTOMY  74 yrs old  . BUNIONECTOMY    . WISDOM TOOTH EXTRACTION  75 yrs old    Family History  Problem Relation Age of Onset  . Cancer Father        melanoma  . Diabetes Mother   . Hypertension Mother   . Hyperlipidemia Mother   . Stroke Mother   . Heart attack Mother   . Heart attack Brother   . Cancer Brother        pancreatic  . Colon cancer Neg Hx   . Breast cancer Neg Hx   . Coronary artery disease Neg Hx     Social History   Socioeconomic History  . Marital status: Married    Spouse name: Not on file  . Number of children: 1  . Years of education: Not on file  . Highest education level: Not on file  Occupational History  . Occupation: Scientist, research (physical sciences): RETIRED  Tobacco Use  . Smoking status: Never Smoker  . Smokeless tobacco: Never Used  Substance and Sexual Activity  . Alcohol use: Yes    Comment: wine  . Drug use: No  . Sexual activity: Yes    Comment: lives with husband  Other Topics Concern  . Not on file  Social History Narrative   University - [place], Masters Degree - counseling, [employer]'s [employer] counseling   Married '69 - 75 years, divorced; married '82   21 daughter - '72; 2 grandchildren  Regular Exercise -  YES         Social Determinants of Health   Financial Resource Strain:   . Difficulty of Paying Living Expenses:   Food Insecurity:   . Worried About Programme researcher, broadcasting/film/video in the Last Year:   . Barista in the Last Year:   Transportation Needs:   . Freight forwarder (Medical):   Marland Kitchen Lack of Transportation (Non-Medical):   Physical Activity:   . Days of Exercise per Week:   . Minutes of Exercise per Session:   Stress:   . Feeling of Stress :   Social Connections:   . Frequency of Communication with Friends and Family:   . Frequency of Social Gatherings with Friends and Family:   . Attends Religious  Services:   . Active Member of Clubs or Organizations:   . Attends Banker Meetings:   Marland Kitchen Marital Status:   Intimate Partner Violence:   . Fear of Current or Ex-Partner:   . Emotionally Abused:   Marland Kitchen Physically Abused:   . Sexually Abused:     Current Outpatient Medications:  .  albuterol (PROVENTIL HFA;VENTOLIN HFA) 108 (90 Base) MCG/ACT inhaler, Inhale 2 puffs into the lungs every 6 (six) hours as needed for wheezing or shortness of breath., Disp: 1 Inhaler, Rfl: 0 .  aspirin 81 MG tablet, Take 81 mg by mouth daily., Disp: , Rfl:  .  Cholecalciferol (VITAMIN D3) 2000 units TABS, Take 1 tablet by mouth daily., Disp: , Rfl:  .  COLLAGEN PO, Take by mouth daily., Disp: , Rfl:  .  Cyanocobalamin (VITAMIN B-12) 5000 MCG TBDP, Take 1 tablet by mouth daily., Disp: , Rfl:  .  gabapentin (NEURONTIN) 300 MG capsule, Take 1 capsule (300 mg total) by mouth 3 (three) times daily as needed (nerve pain). Take mostly at bedtime, Disp: 90 capsule, Rfl: 1 .  hydrochlorothiazide (HYDRODIURIL) 25 MG tablet, Take 1 tablet (25 mg total) by mouth daily., Disp: 90 tablet, Rfl: 2 .  lisinopril (ZESTRIL) 10 MG tablet, TAKE TWO TABLETS BY MOUTH DAILY , Disp: 180 tablet, Rfl: 0 .  meloxicam (MOBIC) 15 MG tablet, Take 1 tablet (15 mg total) by mouth daily., Disp: 30 tablet, Rfl: 0 .  metoprolol tartrate (LOPRESSOR) 25 MG tablet, TAKE HALF TABLET BY MOUTH TWICE DAILY , Disp: 90 tablet, Rfl: 2 .  Multiple Minerals-Vitamins (CALCIUM CITRATE PLUS PO), Take 2 tablets by mouth daily., Disp: , Rfl:  .  Multiple Vitamin (MULTI-VITAMINS) TABS, Take by mouth., Disp: , Rfl:  .  omega-3 acid ethyl esters (LOVAZA) 1 g capsule, Take 1 capsule (1 g total) by mouth 2 (two) times daily., Disp: 180 capsule, Rfl: 2 .  predniSONE (DELTASONE) 10 MG tablet, Take 3 tablets (30 mg total) by mouth daily with breakfast., Disp: 15 tablet, Rfl: 0  EXAM:  VITALS per patient if applicable:Ht 5' (1.524 m)   BMI 23.87 kg/m    GENERAL: alert, oriented, appears well and in no acute distress  HEENT: atraumatic, conjunctiva clear, no obvious abnormalities on inspection.  LUNGS: on inspection no signs of respiratory distress, breathing rate appears normal, no obvious gross SOB, gasping or wheezing  CV: no obvious cyanosis  PSYCH/NEURO: pleasant and cooperative, no obvious depression or anxiety, speech and thought processing grossly intact  ASSESSMENT AND PLAN:  Discussed the following assessment and plan:  Right calf pain  Sciatica, right side  We reviewed possible etiologies of calf pain, more serious like DVT  seems to be unlikely based on clinical presentation and [place].  Recommended starting Prednisone as instructed by Dr Denyse Amass. Some side effects of prednisone discussed.  Statin side effects discussed, uncommon to cause myalgia in a specific point, recommend stopping it for now and resume once pain has resolved.  Avoid taking prednisone with NSAIDs. Follow with Dr. Denyse Amass as recommended.   I discussed the assessment and treatment plan with the patient. Eileen Hansen was provided an opportunity to ask questions and all were answered. She agreed with the plan and demonstrated an understanding of the instructions.   Return if symptoms worsen or fail to improve.    Betty [name], MD

## 2019-11-07 ENCOUNTER — Ambulatory Visit: Payer: Medicare PPO | Admitting: Family Medicine

## 2019-11-07 ENCOUNTER — Ambulatory Visit (INDEPENDENT_AMBULATORY_CARE_PROVIDER_SITE_OTHER): Payer: Medicare PPO | Admitting: Rehabilitative and Restorative Service Providers"

## 2019-11-07 ENCOUNTER — Other Ambulatory Visit: Payer: Self-pay

## 2019-11-07 DIAGNOSIS — R29898 Other symptoms and signs involving the musculoskeletal system: Secondary | ICD-10-CM

## 2019-11-07 DIAGNOSIS — M79604 Pain in right leg: Secondary | ICD-10-CM | POA: Diagnosis not present

## 2019-11-07 DIAGNOSIS — M5431 Sciatica, right side: Secondary | ICD-10-CM | POA: Diagnosis not present

## 2019-11-07 NOTE — Therapy (Signed)
[place], [place], [address] Phone: [phone]   Fax:  [phone]  Physical Therapy Evaluation  Patient Details  Name: Eileen Hansen MRN: [id number] Date of Birth: 1946/05/23 Referring Provider (PT): Dr Lynne Leader    Encounter Date: 11/07/2019  PT End of Session - 11/07/19 1425    Visit Number  1    Number of Visits  12    Date for PT Re-Evaluation  12/19/19    PT Start Time  1448    PT Stop Time  1434    PT Time Calculation (min)  40 min    Activity Tolerance  Patient tolerated treatment well       Past Medical History:  Diagnosis Date  . Allergic rhinitis   . Arthralgia 09/30/2015  . Bronchitis, acute 01/09/2017  . Chronic shoulder pain 12/07/2016  . Epistaxis 09/30/2015  . Globus sensation 02/23/2015  . Heart murmur 12/07/2016  . HTN (hypertension)   . Left hip pain 09/30/2015  . Medicare annual wellness visit, subsequent 04/14/2015  . Pain in the wrist, unspecified laterality 03/03/2014  . Tachycardia    Episodic    Past Surgical History:  Procedure Laterality Date  . APPENDECTOMY  73 yrs old  . BUNIONECTOMY    . WISDOM TOOTH EXTRACTION  74 yrs old    There were no vitals filed for this visit.   Subjective Assessment - 11/07/19 1356    Subjective  Patient reports that she has had pain in the Rt calf since 10/17/19 with no known injury. She was seen in the ED and treated with medication and released. She did not have a blood clot. The pain in the calf has gotten a "little better"    Patient Stated Goals  get rid of pain in the Rt leg    Currently in Pain?  Yes    Pain Score  6     Pain Location  Leg    Pain Orientation  Right    Pain Descriptors / Indicators  Dull    Pain Type  Acute pain    Pain Onset  1 to 4 weeks ago    Pain Frequency  Constant    Aggravating Factors   some movements; bending knee up; worse at night; standing; walking; getting in and out of the bath    Pain  Relieving Factors  unknown         [facility] PT Assessment - 11/07/19 0001      Assessment   Medical Diagnosis  Rt sciatica    Referring Provider (PT)  Dr Lynne Leader     Onset Date/Surgical Date  10/17/19    Hand Dominance  Right    Next MD Visit  PRN     Prior Therapy  none      Precautions   Precautions  None      Restrictions   Weight Bearing Restrictions  No      Balance Screen   Has the patient fallen in the past 6 months  No    Has the patient had a decrease in activity level because of a fear of falling?   No    Is the patient reluctant to leave their home because of a fear of falling?   No      Prior Function   Level of Independence  Independent    Vocation  Retired    Equities trader     Leisure  cleaning; cooking; sedentary       Observation/Other Assessments   Focus on Therapeutic Outcomes (FOTO)   51% limitation       Sensation   Additional Comments  WFL's per pt report       Posture/Postural Control   Posture Comments  forward posture - wt shifted to Lt with Rt LE in ER       AROM   Right/Left Hip  --   tight Rt hip rotation   Right/Left Knee  --   Rt tight Rt knee flexion    Right/Left Ankle  --   WFL's bilat    Lumbar Flexion  80%    Lumbar Extension  60%    Lumbar - Right Side Bend  75%    Lumbar - Left Side Bend  75%    Lumbar - Right Rotation  35% "tension" in the Rt lateral thigh    Lumbar - Left Rotation  35% "tension" in the Rt lateral thigh       Strength   Overall Strength Comments  functional strength LE's not tested resistively       Flexibility   Hamstrings  tight Rt > Lt     Quadriceps  tight Rt with c/o pain posterior knee     ITB  tight Rt     Piriformis  tight Rt       Palpation   Spinal mobility  mild tightness and tenderness lumbar spine L4/L5 with PA mobs     Palpation comment  tenderness and tightness Rt posterior calf/knee/thigh; mild tightness Lt posterior hip - gluts/piriformis       Special Tests    Other special tests  (-) slump test; (-) SLR       Ambulation/Gait   Gait Comments  ambulates with limp Rt LE in wt bearing Rt       Balance   Balance Assessed  --   SLS ~ Rt10 sec with minimal LOB; Lt ~ 5 sec                Objective measurements completed on examination: See above findings.      [facility] Adult PT Treatment/Exercise - 11/07/19 0001      Knee/Hip Exercises: Stretches   Passive Hamstring Stretch  Right;3 reps;30 seconds    Gastroc Stretch  Right;2 reps;30 seconds    Soleus Stretch  Right;1 rep;30 seconds             PT Education - 11/07/19 1421    Education Details  POC, HEP    Person(s) Educated  Patient    Methods  Explanation;Handout;Demonstration;Tactile cues;Verbal cues    Comprehension  Verbalized understanding          PT Long Term Goals - 11/07/19 1431      PT LONG TERM GOAL #1   Title  Decrease pain Rt LE by 50-75% allowing patient to participate in functional activities    Time  6    Period  Weeks    Status  New    Target Date  12/19/19      PT LONG TERM GOAL #2   Title  Return Rt knee and hip to full pain free ROM - equal to Lt hip and knee    Time  6    Period  Weeks    Status  New    Target Date  12/19/19      PT LONG TERM GOAL #3   Title  Increase hamstring flexibility  Rt to equal Lt    Time  6    Period  Weeks    Status  New    Target Date  12/19/19      PT LONG TERM GOAL #4   Title  Independent in HEP    Time  6    Period  Weeks    Status  New    Target Date  12/19/19      PT LONG TERM GOAL #5   Title  Improve FOTO to </= 33% limitation    Time  6    Period  Weeks    Status  New    Target Date  12/19/19             Plan - 11/07/19 1426    Clinical Impression Statement  Patient presents with c/o Rt leg pain which started 10/17/19 and has persisted. DVT was ruled out in ED at onset of symptoms. Patient has poor gait pattern; poor posture and alignment in standing; limited Rt knee flexion and Rt  hip due to pain; muscular tightness and tenderness to palpation in Rt posterior thigh/hamstrings and calf; pain limiting functional activities. She will benefit from PT to address problems identified.    Stability/Clinical Decision Making  Stable/Uncomplicated    Clinical Decision Making  Low    Rehab Potential  Good    PT Frequency  2x / week    PT Duration  6 weeks    PT Treatment/Interventions  Patient/family education;ADLs/Self Care Home Management;Cryotherapy;Electrical Stimulation;Iontophoresis 4mg /ml Dexamethasone;Moist Heat;Ultrasound;Gait training;Functional mobility training;Therapeutic activities;Therapeutic exercise;Balance training;Neuromuscular re-education;Manual techniques;Dry needling;Taping    PT Next Visit Plan  review HEP; add manual work through the posterior Rt thigh and calf; progress exercises as indicated; modalities as indicated    PT Home Exercise Plan  [email or web address]    Consulted and Agree with Plan of Care  Patient       Patient will benefit from skilled therapeutic intervention in order to improve the following deficits and impairments:     Visit Diagnosis: Pain of right lower extremity - Plan: PT plan of care cert/re-cert  Sciatica, right side - Plan: PT plan of care cert/re-cert  Other symptoms and signs involving the musculoskeletal system - Plan: PT plan of care cert/re-cert     Problem List Patient Active Problem List   Diagnosis Date Noted  . Sciatica, right side 10/29/2019  . RUQ pain 07/20/2018  . Influenza A 07/18/2018  . Senile ecchymosis 02/03/2018  . Atypical chest pain 04/17/2017  . Bronchitis, acute 01/09/2017  . Heart murmur 12/07/2016  . Chronic shoulder pain 12/07/2016  . Epistaxis 09/30/2015  . 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 Left arm pain 09/30/2015  . Left hip pain 09/30/2015  . Generalized osteoarthritis of multiple sites 09/30/2015  . Medicare annual wellness visit, subsequent 04/14/2015  . Globus sensation 02/23/2015  .  Osteopenia 04/15/2014  . Prediabetes 04/07/2014  . Pain in the wrist, unspecified laterality 03/03/2014  . Paresthesia of left arm 11/16/2013  . Lateral epicondylitis of left elbow 04/19/2013  . Hematochezia 04/19/2013  . DDD (degenerative disc disease), cervical 05/16/2012  . Preventative health care 03/31/2011  . JOINT PAIN, HAND 07/23/2010  . DISTURBANCE OF SKIN SENSATION 07/04/2009  . Hyperlipidemia, mixed 07/04/2009  . Constipation 12/02/2008  . TENESMUS 12/02/2008  . Tachycardia 11/07/2007  . Essential hypertension 06/01/2007  . ALLERGIC RHINITIS 06/01/2007    Jaxsen Bernhart 06/03/2007 PT, MPH  11/07/2019, 5:01 PM  [facility] [phone]  [place] [address] [place], [place], [address] Phone: [phone]   Fax:  [phone]  Name: Eileen Hansen MRN: [id number] Date of Birth: 1946-06-06

## 2019-11-07 NOTE — Patient Instructions (Signed)
Access Code: [email or web address]  URL: [email or web address]: 04/21/2021Prepared by: [facility]Exercises  Standing Gastroc Stretch - 2-3 x daily - 7 x weekly - 1 sets - 3 reps - 30 seconds hold  Standing Soleus Stretch - 2-3 x daily - 7 x weekly - 1 sets - 3 reps - 30 seconds hold  Hooklying Hamstring Stretch with Strap - 2-3 x daily - 7 x weekly - 1 sets - 3 reps - 30 seconds hold

## 2019-11-09 ENCOUNTER — Other Ambulatory Visit: Payer: Self-pay

## 2019-11-09 ENCOUNTER — Encounter: Payer: Self-pay | Admitting: Physical Therapy

## 2019-11-09 ENCOUNTER — Ambulatory Visit (INDEPENDENT_AMBULATORY_CARE_PROVIDER_SITE_OTHER): Payer: Medicare PPO | Admitting: Physical Therapy

## 2019-11-09 DIAGNOSIS — M5431 Sciatica, right side: Secondary | ICD-10-CM

## 2019-11-09 DIAGNOSIS — R29898 Other symptoms and signs involving the musculoskeletal system: Secondary | ICD-10-CM

## 2019-11-09 DIAGNOSIS — M79604 Pain in right leg: Secondary | ICD-10-CM | POA: Diagnosis not present

## 2019-11-09 NOTE — Patient Instructions (Signed)

## 2019-11-09 NOTE — Therapy (Signed)
[facility] [place] [address] [place], [place], [address] Phone: [phone]   Fax:  [phone]  Physical Therapy Treatment  Patient Details  Name: Eileen Hansen MRN: [id number] Date of Birth: September 22, 1945 Referring Provider (PT): Dr Clementeen Graham    Encounter Date: 11/09/2019  PT End of Session - 11/09/19 1023    Visit Number  2    Number of Visits  12    Date for PT Re-Evaluation  12/19/19    PT Start Time  1021    PT Stop Time  1108   ice 10 min at end   PT Time Calculation (min)  47 min    Behavior During Therapy  [facility] for tasks assessed/performed       Past Medical History:  Diagnosis Date  . Allergic rhinitis   . Arthralgia 09/30/2015  . Bronchitis, acute 01/09/2017  . Chronic shoulder pain 12/07/2016  . Epistaxis 09/30/2015  . Globus sensation 02/23/2015  . Heart murmur 12/07/2016  . HTN (hypertension)   . Left hip pain 09/30/2015  . Medicare annual wellness visit, subsequent 04/14/2015  . Pain in the wrist, unspecified laterality 03/03/2014  . Tachycardia    Episodic    Past Surgical History:  Procedure Laterality Date  . APPENDECTOMY  74 yrs old  . BUNIONECTOMY    . WISDOM TOOTH EXTRACTION  74 yrs old    There were no vitals filed for this visit.  Subjective Assessment - 11/09/19 1023    Subjective  Pt reports increase in Rt leg pain since therapy. She is unsure if she wants to continue therapy if she is going to have increased pain, "Yesterday was awful".    Patient Stated Goals  get rid of pain in Rt leg    Currently in Pain?  No/denies    Pain Score  0-No pain   pain up to 6/10 when walking/ standing   Pain Location  Leg    Pain Orientation  Right;Lateral;Posterior;Lower    Pain Radiating Towards  up and down whole Rt leg    Pain Onset  1 to 4 weeks ago    Pain Frequency  Intermittent    Aggravating Factors   standing, walking, worse at night    Pain Relieving Factors  sitting, resting         [facility]  PT Assessment - 11/09/19 0001      Assessment   Medical Diagnosis  Rt sciatica    Referring Provider (PT)  Dr Clementeen Graham     Onset Date/Surgical Date  10/17/19    Hand Dominance  Right    Next MD Visit  PRN     Prior Therapy  none      [facility] Adult PT Treatment/Exercise - 11/09/19 0001      Lumbar Exercises: Stretches   Passive Hamstring Stretch  Right;2 reps;30 seconds   supine with strap     Lumbar Exercises: Aerobic   Nustep  L4: 6.5 min, cues to increase speed.       Knee/Hip Exercises: Stretches   Scientist, research (physical sciences) reps;30 seconds   seated with strap   Soleus Stretch  Right;2 reps;30 seconds   seated     Knee/Hip Exercises: Standing   Hip Abduction  Right;1 set;10 reps;Knee straight   cues for form   Hip Extension  Stengthening;Right;1 set;10 reps;Knee straight    Extension Limitations  cues to keep knee straight.       Knee/Hip Exercises: Seated  Sit to Sand  2 sets;10 reps;without UE support      Knee/Hip Exercises: Supine   Bridges  Strengthening;Both;1 set;10 reps   heels away from bottom more to increase knee comfort   Other Supine Knee/Hip Exercises  20 seconds, 1 rep for trial; figure 4 stretch trial; not tolerable;  piriformis stretch (travell) Lt LE straight, Rt LE crosses over, Lt hand hold     Cryotherapy   Number Minutes Cryotherapy  10 Minutes    Cryotherapy Location  Knee   Rt   Type of Cryotherapy  Ice pack      Manual Therapy   Manual Therapy  Taping    Manual therapy comments  1 strip sensitive skin Rock tape applied with 50% stretch to lateral Rt knee, over hamstring insertion to decompress tissue, decrease pain and increase proprioception            PT Education - 11/09/19 1113    Education Details  ktape info; HEP; conditon    Person(s) Educated  Patient    Methods  Explanation;Handout;Demonstration;Tactile cues;Verbal cues    Comprehension  Verbalized understanding;Returned demonstration          PT Long Term Goals  - 11/09/19 1304      PT LONG TERM GOAL #1   Title  Decrease pain Rt LE by 50-75% allowing patient to participate in functional activities    Time  6    Period  Weeks    Status  New      PT LONG TERM GOAL #2   Title  Return Rt knee and hip to full pain free ROM - equal to Lt hip and knee    Time  6    Period  Weeks    Status  New      PT LONG TERM GOAL #3   Title  Increase hamstring flexibility Rt to equal Lt    Time  6    Period  Weeks    Status  New      PT LONG TERM GOAL #4   Title  Independent in HEP    Time  6    Period  Weeks    Status  New      PT LONG TERM GOAL #5   Title  Improve FOTO to </= 33% limitation    Time  6    Period  Weeks    Status  New            Plan - 11/09/19 1216    Clinical Impression Statement  Pt had increased in Rt post knee discomfort during exercises. There was palpable tenderness to lateral Rt knee at hamstring insertion. Slight medial swelling noted on Rt knee. HEP was updated to increase pt compliance. She is progressing towards goals.    Rehab Potential  Good    PT Frequency  2x / week    PT Duration  6 weeks    PT Treatment/Interventions  Patient/family education;ADLs/Self Care Home Management;Cryotherapy;Electrical Stimulation;Iontophoresis 4mg /ml Dexamethasone;Moist Heat;Ultrasound;Gait training;Functional mobility training;Therapeutic activities;Therapeutic exercise;Balance training;Neuromuscular re-education;Manual techniques;Dry needling;Taping    PT Next Visit Plan  review HEP; add manual work through the posterior Rt thigh and calf; progress exercises as indicated; modalities as indicated    PT Home Exercise Plan  [id number]       Patient will benefit from skilled therapeutic intervention in order to improve the following deficits and impairments:     Visit Diagnosis: Pain of right lower extremity  Sciatica, right side  Other symptoms and signs involving the musculoskeletal system     Problem List Patient Active  Problem List   Diagnosis Date Noted  . Sciatica, right side 10/29/2019  . RUQ pain 07/20/2018  . Influenza A 07/18/2018  . Senile ecchymosis 02/03/2018  . Atypical chest pain 04/17/2017  . Bronchitis, acute 01/09/2017  . Heart murmur 12/07/2016  . Chronic shoulder pain 12/07/2016  . Epistaxis 09/30/2015  . 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 Left arm pain 09/30/2015  . Left hip pain 09/30/2015  . Generalized osteoarthritis of multiple sites 09/30/2015  . Medicare annual wellness visit, subsequent 04/14/2015  . Globus sensation 02/23/2015  . Osteopenia 04/15/2014  . Prediabetes 04/07/2014  . Pain in the wrist, unspecified laterality 03/03/2014  . Paresthesia of left arm 11/16/2013  . Lateral epicondylitis of left elbow 04/19/2013  . Hematochezia 04/19/2013  . DDD (degenerative disc disease), cervical 05/16/2012  . Preventative health care 03/31/2011  . JOINT PAIN, HAND 07/23/2010  . DISTURBANCE OF SKIN SENSATION 07/04/2009  . Hyperlipidemia, mixed 07/04/2009  . Constipation 12/02/2008  . TENESMUS 12/02/2008  . Tachycardia 11/07/2007  . Essential hypertension 06/01/2007  . ALLERGIC RHINITIS 06/01/2007   This entire session was performed under direct supervision and direction of a licensed Physical Brewing technologist. I have personally read, edited and approved of the note as written.  Kerin Perna, PTA 11/09/19 1:51 PM   Ronaldo Miyamoto, SPTA 11/09/19 2:31 PM   [facility] [place], [place], [address] Phone: [phone]   Fax:  [phone]  Name: Eileen Hansen MRN: [id number] Date of Birth: [birth year]

## 2019-11-13 ENCOUNTER — Encounter: Payer: Self-pay | Admitting: Family Medicine

## 2019-11-14 ENCOUNTER — Encounter: Payer: Self-pay | Admitting: Physical Therapy

## 2019-11-14 ENCOUNTER — Ambulatory Visit: Payer: Medicare PPO | Admitting: Physical Therapy

## 2019-11-14 ENCOUNTER — Other Ambulatory Visit: Payer: Self-pay

## 2019-11-14 DIAGNOSIS — M5431 Sciatica, right side: Secondary | ICD-10-CM

## 2019-11-14 DIAGNOSIS — R29898 Other symptoms and signs involving the musculoskeletal system: Secondary | ICD-10-CM | POA: Diagnosis not present

## 2019-11-14 DIAGNOSIS — M79604 Pain in right leg: Secondary | ICD-10-CM | POA: Diagnosis not present

## 2019-11-14 MED ORDER — PREGABALIN 75 MG PO CAPS: 75.0000 mg | ORAL_CAPSULE | Freq: Two times a day (BID) | ORAL | 3 refills | Status: DC | PRN

## 2019-11-14 NOTE — Therapy (Signed)
[place], [place], [address] Phone: [phone]   Fax:  [phone]  Physical Therapy Treatment  Patient Details  Name: Eileen Hansen MRN: [id number] Date of Birth: 08-28-1945 Referring Provider (PT): Dr Lynne Leader    Encounter Date: 11/14/2019  PT End of Session - 11/14/19 1356    Visit Number  3    Number of Visits  12    Date for PT Re-Evaluation  12/19/19    PT Start Time  8299    PT Stop Time  1436   MHP / ice last 10 min   PT Time Calculation (min)  48 min       Past Medical History:  Diagnosis Date  . Allergic rhinitis   . Arthralgia 09/30/2015  . Bronchitis, acute 01/09/2017  . Chronic shoulder pain 12/07/2016  . Epistaxis 09/30/2015  . Globus sensation 02/23/2015  . Heart murmur 12/07/2016  . HTN (hypertension)   . Left hip pain 09/30/2015  . Medicare annual wellness visit, subsequent 04/14/2015  . Pain in the wrist, unspecified laterality 03/03/2014  . Tachycardia    Episodic    Past Surgical History:  Procedure Laterality Date  . APPENDECTOMY  74 yrs old  . BUNIONECTOMY    . WISDOM TOOTH EXTRACTION  74 yrs old    There were no vitals filed for this visit.  Subjective Assessment - 11/14/19 1403    Subjective  Pt reports that Rt leg is feeling better but still has some discomfort during exercise. She reports that her Rt knee was swollen and hot last night. She had not been moving as much during the day. She states that she is "concerned" about putting weight in the Rt because of the pain. Pt is aware that less movement will lead to Rt knee stiffness. Eileen Hansen expressed concerns of a blood clot.    Patient Stated Goals  get rid of pain in Rt leg    Currently in Pain?  No/denies    Pain Score  0-No pain    Pain Location  Knee    Pain Orientation  Posterior;Right    Pain Descriptors / Indicators  Discomfort    Pain Onset  1 to 4 weeks ago    Pain Frequency  Intermittent     Aggravating Factors   stairs         [facility] PT Assessment - 11/14/19 0001      Assessment   Medical Diagnosis  Rt sciatica    Referring Provider (PT)  Dr Lynne Leader     Onset Date/Surgical Date  10/17/19    Hand Dominance  Right    Next MD Visit  PRN     Prior Therapy  none      Special Tests    Special Tests  --    Other special tests  Negative Homan's test, Rt LE          [facility] Adult PT Treatment/Exercise - 11/14/19 0001      Knee/Hip Exercises: Stretches   Passive Hamstring Stretch  Right;2 reps;20 seconds   supine with strap   ITB Stretch  2 reps;Right;20 seconds   supine with strap   Gastroc Stretch  Right;2 reps;30 seconds   seated with strap     Knee/Hip Exercises: Aerobic   Nustep  L4; 4.5 mins      Knee/Hip Exercises: Standing   Terminal Knee Extension  Right;2 sets;10 reps;Strengthening   one set yellow band, 2nd set  blue ball at wall   Forward Step Up  Right;4 sets;5 reps;Hand Hold: 2;Step Height: 4"    Forward Step Up Limitations  reduced stance time on Rt LE; constant cues for sequence   Step Down  Left;4 sets;5 reps;Hand Hold: 2;Step Height: 4"    Step Down Limitations  leaning backwards to decrease weight o Rt Le    Functional Squat  1 set;5 reps   no pain with Bilat knee flexion/extension   Functional Squat Limitations  to mat table      Knee/Hip Exercises: Seated   Heel Slides  Right;AAROM;1 set   3 reps, discontinued due to pt discomfort   Heel Slides Limitations  sharp pain beyond 95 degrees Rt knee flexion      Knee/Hip Exercises: Prone   Other Prone Exercises  QS, 5 reps, 5 sec hold      Modalities   Modalities  Moist Heat;Cryotherapy      Moist Heat Therapy   Number Minutes Moist Heat  10 Minutes    Moist Heat Location  Knee   posterior Rt      Cryotherapy   Number Minutes Cryotherapy  10 Minutes    Cryotherapy Location  Knee   ant Rt   Type of Cryotherapy  Ice pack      Manual Therapy   Manual Therapy  Soft tissue  mobilization;Taping    Manual therapy comments  squid shaped pieces of Rock tape applied to ant Rt knee to assist with edema reduction;  I strip of sensitive skin tape applied to posterior lateral Rt knee with 50% stretch to increase proprioception/ decompress tissue and decrease pain.    Soft tissue mobilization  STM to Rt biceps femoris (tender in the midbelly of muscle)         PT Education - 11/14/19 1611    Education Details  ktape info, HEP, condition, signs of blood clots    Person(s) Educated  Patient    Methods  Explanation;Demonstration;Tactile cues    Comprehension  Verbalized understanding          PT Long Term Goals - 11/14/19 1611      PT LONG TERM GOAL #1   Title  Decrease pain Rt LE by 50-75% allowing patient to participate in functional activities    Time  6    Period  Weeks    Status  On-going      PT LONG TERM GOAL #2   Title  Return Rt knee and hip to full pain free ROM - equal to Lt hip and knee    Time  6    Period  Weeks    Status  On-going      PT LONG TERM GOAL #3   Title  Increase hamstring flexibility Rt to equal Lt    Time  6    Period  Weeks    Status  On-going      PT LONG TERM GOAL #4   Title  Independent in HEP    Time  6    Period  Weeks    Status  On-going      PT LONG TERM GOAL #5   Title  Improve FOTO to </= 33% limitation    Time  6    Period  Weeks    Status  On-going            Plan - 11/14/19 1612    Clinical Impression Statement  Pt had increased Rt hamstring discomfort with  knee flexion beyond 90 degrees. Palpable tightness and tenderness to Rt biceps femoris.  Rt knee swelling present; trial of sensitive skin rock tape applied for edema reduction. Slight increase in Rt knee temperature; negative Homan's. Pt was educated in inflammatory process, fluid reduction via taping and signs of a LE blood clot, per request. HEP is going well; pt verbalizes compliance with program. Positive response to tape applied to posterior  knee last visit; repeated today.  Goals are on-going.    Rehab Potential  Good    PT Frequency  2x / week    PT Duration  6 weeks    PT Treatment/Interventions  Patient/family education;ADLs/Self Care Home Management;Cryotherapy;Electrical Stimulation;Iontophoresis 4mg /ml Dexamethasone;Moist Heat;Ultrasound;Gait training;Functional mobility training;Therapeutic activities;Therapeutic exercise;Balance training;Neuromuscular re-education;Manual techniques;Dry needling;Taping    PT Next Visit Plan  review HEP; add manual work through the posterior Rt thigh and calf; progress exercises as indicated; modalities as indicated    PT Home Exercise Plan  [id number]       Patient will benefit from skilled therapeutic intervention in order to improve the following deficits and impairments:     Visit Diagnosis: Sciatica, right side  Other symptoms and signs involving the musculoskeletal system  Pain of right lower extremity     Problem List Patient Active Problem List   Diagnosis Date Noted  . Sciatica, right side 10/29/2019  . RUQ pain 07/20/2018  . Influenza A 07/18/2018  . Senile ecchymosis 02/03/2018  . Atypical chest pain 04/17/2017  . Bronchitis, acute 01/09/2017  . Heart murmur 12/07/2016  . Chronic shoulder pain 12/07/2016  . Epistaxis 09/30/2015  . 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 Left arm pain 09/30/2015  . Left hip pain 09/30/2015  . Generalized osteoarthritis of multiple sites 09/30/2015  . Medicare annual wellness visit, subsequent 04/14/2015  . Globus sensation 02/23/2015  . Osteopenia 04/15/2014  . Prediabetes 04/07/2014  . Pain in the wrist, unspecified laterality 03/03/2014  . Paresthesia of left arm 11/16/2013  . Lateral epicondylitis of left elbow 04/19/2013  . Hematochezia 04/19/2013  . DDD (degenerative disc disease), cervical 05/16/2012  . Preventative health care 03/31/2011  . JOINT PAIN, HAND 07/23/2010  . DISTURBANCE OF SKIN SENSATION 07/04/2009  .  Hyperlipidemia, mixed 07/04/2009  . Constipation 12/02/2008  . TENESMUS 12/02/2008  . Tachycardia 11/07/2007  . Essential hypertension 06/01/2007  . ALLERGIC RHINITIS 06/01/2007   This entire session was performed under direct supervision and direction of a licensed Physical 06/03/2007. I have personally read, edited and approved of the note as written.  Environmental health practitioner, PTA 11/14/19 5:06 PM   11/16/19, SPTA 11/14/19 4:16 PM    [facility] [place] [address] [place], [place], [place] Phone: [phone]   Fax:  [phone]  Name: Eileen Hansen MRN: Drema Balzarine Date of Birth: April 03, 1946

## 2019-11-16 ENCOUNTER — Other Ambulatory Visit: Payer: Self-pay

## 2019-11-16 ENCOUNTER — Ambulatory Visit (INDEPENDENT_AMBULATORY_CARE_PROVIDER_SITE_OTHER): Payer: Medicare PPO | Admitting: Physical Therapy

## 2019-11-16 DIAGNOSIS — M79604 Pain in right leg: Secondary | ICD-10-CM

## 2019-11-16 DIAGNOSIS — R29898 Other symptoms and signs involving the musculoskeletal system: Secondary | ICD-10-CM

## 2019-11-16 DIAGNOSIS — M5431 Sciatica, right side: Secondary | ICD-10-CM | POA: Diagnosis not present

## 2019-11-16 NOTE — Therapy (Signed)
[facility] [place] [address] [place], [place], [address] Phone: [phone]   Fax:  [phone]  Physical Therapy Treatment  Patient Details  Name: Eileen Hansen MRN: [id number] Date of Birth: May 27, 1946 Referring Provider (PT): Dr Clementeen Graham    Encounter Date: 11/16/2019  PT End of Session - 11/16/19 1317    Visit Number  4    Number of Visits  12    Date for PT Re-Evaluation  12/19/19    PT Start Time  1317    PT Stop Time  1408   ice pack last 10 min   PT Time Calculation (min)  51 min       Past Medical History:  Diagnosis Date  . Allergic rhinitis   . Arthralgia 09/30/2015  . Bronchitis, acute 01/09/2017  . Chronic shoulder pain 12/07/2016  . Epistaxis 09/30/2015  . Globus sensation 02/23/2015  . Heart murmur 12/07/2016  . HTN (hypertension)   . Left hip pain 09/30/2015  . Medicare annual wellness visit, subsequent 04/14/2015  . Pain in the wrist, unspecified laterality 03/03/2014  . Tachycardia    Episodic    Past Surgical History:  Procedure Laterality Date  . APPENDECTOMY  74 yrs old  . BUNIONECTOMY    . WISDOM TOOTH EXTRACTION  74 yrs old    There were no vitals filed for this visit.  Subjective Assessment - 11/16/19 1322    Subjective  Pt reports that she is having less pain; stairs are still causing her some trouble so she avoids them. HEP is going well per pt report and she has been rolling her leg on a ball at home. Pt voiced concerns that cholesterol medications were causing her Rt leg pain. She states she discontinued choleterol meds 3 days ago; occured after conversation with pharamacist.    Currently in Pain?  No/denies    Pain Score  0-No pain         [facility] PT Assessment - 11/16/19 0001      Assessment   Medical Diagnosis  Rt sciatica    Referring Provider (PT)  Dr Clementeen Graham     Onset Date/Surgical Date  10/17/19    Hand Dominance  Right    Next MD Visit  PRN     Prior Therapy  none        [facility] Adult PT Treatment/Exercise - 11/16/19 0001      Lumbar Exercises: Stretches   Gastroc Stretch  2 reps;20 seconds;Right;Left   bilat on 4" stair, VC's for alignment     Knee/Hip Exercises: Standing   Terminal Knee Extension  Right;10 reps;Strengthening;Theraband;2 sets;5 reps   one set yellow band x 5, 2nd set red x10, 5 sec   Theraband Level (Terminal Knee Extension)  Level 1 (Yellow);Level 2 (Red)    Forward Step Up  Right;5 reps;10 reps;Hand Hold: 2;Step Height: 4";Step Height: 6";2 sets   1 set, 5 rep 4", progressed to 6", 1 set, x10   Forward Step Up Limitations  no pain    Step Down  Left;2 sets;Hand Hold: 2;5 reps;10 reps;Step Height: 4";Step Height: 6"   1 set, 5 rep 4", progressed to 6", 1 set, x10   Step Down Limitations  no pain    SLS  2 reps ea side, Rt 25 then 30 sec, Lt, 30 sec; airex, Rt 10 sec x 2, Lt 15 sec x 2.   no pain, mod hand touch down for balance with airex   Other  Standing Knee Exercises  single leg touch to chair, Rt/Lt x 10 ea, 2 sets, no LOB      Knee/Hip Exercises: Supine   Heel Slides  AAROM;Right;10 reps;1 set   with green strap, no pain     Manual Therapy   Manual Therapy  Soft tissue mobilization    Soft tissue mobilization  STM to Rt biceps femoris (tender in the midbelly of muscle) and medial Rt gastroc belly         PT Long Term Goals - 11/16/19 1612      PT LONG TERM GOAL #1   Title  Decrease pain Rt LE by 50-75% allowing patient to participate in functional activities    Time  6    Period  Weeks    Status  On-going      PT LONG TERM GOAL #2   Title  Return Rt knee and hip to full pain free ROM - equal to Lt hip and knee    Time  6    Period  Weeks    Status  On-going      PT LONG TERM GOAL #3   Title  Increase hamstring flexibility Rt to equal Lt    Time  6    Period  Weeks    Status  On-going      PT LONG TERM GOAL #4   Title  Independent in HEP    Time  6    Period  Weeks    Status  On-going      PT LONG  TERM GOAL #5   Title  Improve FOTO to </= 33% limitation    Time  6    Period  Weeks    Status  On-going       Plan - 11/16/19 1613    Clinical Impression Statement  Improved exercise tolerance, with no increase in Rt leg or knee pain. Her pain has reduced per pt statement and she has started to regain Rt knee function WNL. Palpable tightness to medial head Rt gastroc and high hamstring, STM eased discomfort. Slight Rt knee swelling; less than previously observed in past sessions. Taping provided support, she opted to try next visit to assess how massage compared. Pt is making progress toward all goals.    Stability/Clinical Decision Making  Stable/Uncomplicated    Rehab Potential  Good    PT Frequency  2x / week    PT Duration  6 weeks    PT Treatment/Interventions  Patient/family education;ADLs/Self Care Home Management;Cryotherapy;Electrical Stimulation;Iontophoresis 4mg /ml Dexamethasone;Moist Heat;Ultrasound;Gait training;Functional mobility training;Therapeutic activities;Therapeutic exercise;Balance training;Neuromuscular re-education;Manual techniques;Dry needling;Taping    PT Next Visit Plan  review HEP; add manual work through the posterior Rt thigh and calf; progress exercises as indicated; modalities as indicated. tape    PT Home Exercise Plan  [id number]       Patient will benefit from skilled therapeutic intervention in order to improve the following deficits and impairments:     Visit Diagnosis: Sciatica, right side  Other symptoms and signs involving the musculoskeletal system  Pain of right lower extremity     Problem List Patient Active Problem List   Diagnosis Date Noted  . Sciatica, right side 10/29/2019  . RUQ pain 07/20/2018  . Influenza A 07/18/2018  . Senile ecchymosis 02/03/2018  . Atypical chest pain 04/17/2017  . Bronchitis, acute 01/09/2017  . Heart murmur 12/07/2016  . Chronic shoulder pain 12/07/2016  . Epistaxis 09/30/2015  . Tinnitus of right  ear 09/30/2015  .  Left arm pain 09/30/2015  . Left hip pain 09/30/2015  . Generalized osteoarthritis of multiple sites 09/30/2015  . Medicare annual wellness visit, subsequent 04/14/2015  . Globus sensation 02/23/2015  . Osteopenia 04/15/2014  . Prediabetes 04/07/2014  . Pain in the wrist, unspecified laterality 03/03/2014  . Paresthesia of left arm 11/16/2013  . Lateral epicondylitis of left elbow 04/19/2013  . Hematochezia 04/19/2013  . DDD (degenerative disc disease), cervical 05/16/2012  . Preventative health care 03/31/2011  . JOINT PAIN, HAND 07/23/2010  . DISTURBANCE OF SKIN SENSATION 07/04/2009  . Hyperlipidemia, mixed 07/04/2009  . Constipation 12/02/2008  . TENESMUS 12/02/2008  . Tachycardia 11/07/2007  . Essential hypertension 06/01/2007  . ALLERGIC RHINITIS 06/01/2007    Lannie Fields, SPTA 11/16/19 4:33 PM  This entire session was performed under direct supervision and direction of a licensed Physical Environmental health practitioner. I have personally read, edited and approved of the note as written.  Mayer Camel, PTA 11/16/19 4:57 PM   [facility] [place] [address] [place], [place], [address] Phone: [phone]   Fax:  [phone]  Name: Eileen Hansen MRN: [id number] Date of Birth: 1945/08/02

## 2019-11-21 ENCOUNTER — Other Ambulatory Visit: Payer: Self-pay

## 2019-11-21 ENCOUNTER — Ambulatory Visit (INDEPENDENT_AMBULATORY_CARE_PROVIDER_SITE_OTHER): Payer: Medicare PPO | Admitting: Physical Therapy

## 2019-11-21 DIAGNOSIS — M5431 Sciatica, right side: Secondary | ICD-10-CM | POA: Diagnosis not present

## 2019-11-21 DIAGNOSIS — R29898 Other symptoms and signs involving the musculoskeletal system: Secondary | ICD-10-CM

## 2019-11-21 DIAGNOSIS — M79604 Pain in right leg: Secondary | ICD-10-CM

## 2019-11-21 NOTE — Therapy (Signed)
[facility] [place] [address] [place], [place], [address] Phone: [phone]   Fax:  [phone]  Physical Therapy Treatment  Patient Details  Name: Eileen Hansen MRN: [id number] Date of Birth: 12/02/1945 Referring Provider (PT): Dr Clementeen Graham    Encounter Date: 11/21/2019  PT End of Session - 11/21/19 1725    Visit Number  5    Number of Visits  12    Date for PT Re-Evaluation  12/19/19    PT Start Time  1345    PT Stop Time  1433   ice last 10 min   PT Time Calculation (min)  48 min    Activity Tolerance  Patient tolerated treatment well    Behavior During Therapy  [facility] for tasks assessed/performed       Past Medical History:  Diagnosis Date  . Allergic rhinitis   . Arthralgia 09/30/2015  . Bronchitis, acute 01/09/2017  . Chronic shoulder pain 12/07/2016  . Epistaxis 09/30/2015  . Globus sensation 02/23/2015  . Heart murmur 12/07/2016  . HTN (hypertension)   . Left hip pain 09/30/2015  . Medicare annual wellness visit, subsequent 04/14/2015  . Pain in the wrist, unspecified laterality 03/03/2014  . Tachycardia    Episodic    Past Surgical History:  Procedure Laterality Date  . APPENDECTOMY  74 yrs old  . BUNIONECTOMY    . WISDOM TOOTH EXTRACTION  74 yrs old    There were no vitals filed for this visit.  Subjective Assessment - 11/21/19 1354    Subjective  Pt reports that walking is causing her some discomfort in the back of her Rt leg. She has not reported any increased difficulty with stairs. She states that see feels an improvement in Rt leg pain since starting therapy.    Currently in Pain?  No/denies    Pain Score  0-No pain    Pain Location  Leg    Pain Orientation  Right    Pain Descriptors / Indicators  Tightness    Pain Onset  1 to 4 weeks ago       [facility] Adult PT Treatment/Exercise - 11/21/19 0001      Lumbar Exercises: Stretches   Gastroc Stretch  2 reps;30 seconds   incline board     Lumbar  Exercises: Aerobic   Nustep  L4: 4.5 min      Knee/Hip Exercises: Stretches   Passive Hamstring Stretch  Right;30 seconds;3 reps   supine with strap     Knee/Hip Exercises: Standing   Forward Lunges  1 set;Right;Left;10 reps   UE support on railing, VC's and tactile cues for form   Forward Step Up  Right;5 reps;10 reps;Hand Hold: 2;Step Height: 4";Step Height: 6";2 sets   1 set, 5 rep 4", progressed to 6", 1 set, x10   Forward Step Up Limitations  tactile cues for Rt knee terminal ext at top of step    Step Down  Left;2 sets;Hand Hold: 2;5 reps;10 reps;Step Height: 4";Step Height: 6"   1 set, 5 rep 4", progressed to 6", 1 set, x10   Step Down Limitations  pain in Rt hamstring      Knee/Hip Exercises: Supine   Heel Slides  AAROM;Right;1 set;10 reps   5 sec hold with first 5 reps, with strap   Heel Slides Limitations  mild discomfort in Rt calf      Cryotherapy   Number Minutes Cryotherapy  10 Minutes    Cryotherapy Location  Knee  ant Rt; 2 lb weight   Type of Cryotherapy  Ice pack      Manual therapy:   STM to Rt hamstring to reduce fascial restrictions and improve mobility.        PT Education - 11/21/19 1625    Education Details  HEP updated with heel slides, reviewed HEP to promote compliance, condition and anatomy in surrounding Rt knee area.    Person(s) Educated  Patient    Methods  Explanation;Demonstration;Tactile cues;Handout    Comprehension  Verbalized understanding          PT Long Term Goals - 11/21/19 1620      PT LONG TERM GOAL #1   Title  Decrease pain Rt LE by 50-75% allowing patient to participate in functional activities    Time  6    Period  Weeks    Status  On-going      PT LONG TERM GOAL #2   Title  Return Rt knee and hip to full pain free ROM - equal to Lt hip and knee    Time  6    Period  Weeks    Status  On-going      PT LONG TERM GOAL #3   Title  Increase hamstring flexibility Rt to equal Lt    Time  6    Period  Weeks     Status  On-going      PT LONG TERM GOAL #4   Title  Independent in HEP    Time  6    Period  Weeks    Status  On-going      PT LONG TERM GOAL #5   Title  Improve FOTO to </= 33% limitation    Time  6    Period  Weeks    Status  On-going            Plan - 11/21/19 1620    Clinical Impression Statement  Pt has mild increase in RLE discomfort with stairs; reduced with  form correction. She had no pain today at rest. Persistent tightness of Rt hamstrings; palpable reduction of tightness with STM. Slight Rt knee swelling present; reduced with ice at end of treatment.  Pt is progressing towards all goal.    Stability/Clinical Decision Making  Stable/Uncomplicated    Rehab Potential  Good    PT Frequency  2x / week    PT Duration  6 weeks    PT Treatment/Interventions  Patient/family education;ADLs/Self Care Home Management;Cryotherapy;Electrical Stimulation;Iontophoresis 4mg /ml Dexamethasone;Moist Heat;Ultrasound;Gait training;Functional mobility training;Therapeutic activities;Therapeutic exercise;Balance training;Neuromuscular re-education;Manual techniques;Dry needling;Taping    PT Next Visit Plan  review HEP; add manual work through the posterior Rt thigh and calf; progress exercises as indicated; modalities as indicated. Address goals.    PT Home Exercise Plan  [id number]    Consulted and Agree with Plan of Care  Patient       Patient will benefit from skilled therapeutic intervention in order to improve the following deficits and impairments:     Visit Diagnosis: Pain of right lower extremity  Sciatica, right side  Other symptoms and signs involving the musculoskeletal system     Problem List Patient Active Problem List   Diagnosis Date Noted  . Sciatica, right side 10/29/2019  . RUQ pain 07/20/2018  . Influenza A 07/18/2018  . Senile ecchymosis 02/03/2018  . Atypical chest pain 04/17/2017  . Bronchitis, acute 01/09/2017  . Heart murmur 12/07/2016  . Chronic  shoulder pain 12/07/2016  . Epistaxis  09/30/2015  . 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 Left arm pain 09/30/2015  . Left hip pain 09/30/2015  . Generalized osteoarthritis of multiple sites 09/30/2015  . Medicare annual wellness visit, subsequent 04/14/2015  . Globus sensation 02/23/2015  . Osteopenia 04/15/2014  . Prediabetes 04/07/2014  . Pain in the wrist, unspecified laterality 03/03/2014  . Paresthesia of left arm 11/16/2013  . Lateral epicondylitis of left elbow 04/19/2013  . Hematochezia 04/19/2013  . DDD (degenerative disc disease), cervical 05/16/2012  . Preventative health care 03/31/2011  . JOINT PAIN, HAND 07/23/2010  . DISTURBANCE OF SKIN SENSATION 07/04/2009  . Hyperlipidemia, mixed 07/04/2009  . Constipation 12/02/2008  . TENESMUS 12/02/2008  . Tachycardia 11/07/2007  . Essential hypertension 06/01/2007  . ALLERGIC RHINITIS 06/01/2007    This entire session was performed under direct supervision and direction of a licensed Physical Environmental health practitioner. I have personally read, edited and approved of the note as written.  Mayer Camel, PTA 11/21/19 5:31 PM  Christell Constant. Tramon Crescenzo, [facility]    [facility] [address] [place], [place], [address] Phone: [phone]   Fax:  [phone]  Name: JANMARIE SMOOT MRN: [id number] Date of Birth: 24-Nov-1945

## 2019-11-23 ENCOUNTER — Encounter: Payer: Self-pay | Admitting: Physical Therapy

## 2019-11-23 ENCOUNTER — Ambulatory Visit (INDEPENDENT_AMBULATORY_CARE_PROVIDER_SITE_OTHER): Payer: Medicare PPO | Admitting: Physical Therapy

## 2019-11-23 ENCOUNTER — Other Ambulatory Visit: Payer: Self-pay

## 2019-11-23 DIAGNOSIS — M79604 Pain in right leg: Secondary | ICD-10-CM

## 2019-11-23 DIAGNOSIS — R29898 Other symptoms and signs involving the musculoskeletal system: Secondary | ICD-10-CM

## 2019-11-23 DIAGNOSIS — M5431 Sciatica, right side: Secondary | ICD-10-CM | POA: Diagnosis not present

## 2019-11-23 NOTE — Therapy (Signed)
[facility] [place] [address] [place], [place], [address] Phone: [phone]   Fax:  [phone]  Physical Therapy Treatment  Patient Details  Name: Eileen Hansen MRN: [id number] Date of Birth: [birth year] Referring Provider (PT): Dr Clementeen Graham    Encounter Date: 11/23/2019  PT End of Session - 11/23/19 1401    Visit Number  6    Number of Visits  12    Date for PT Re-Evaluation  12/19/19    PT Start Time  1318    PT Stop Time  1404   last 10 min CP   PT Time Calculation (min)  46 min    Behavior During Therapy  [facility] for tasks assessed/performed       Past Medical History:  Diagnosis Date  . Allergic rhinitis   . Arthralgia 09/30/2015  . Bronchitis, acute 01/09/2017  . Chronic shoulder pain 12/07/2016  . Epistaxis 09/30/2015  . Globus sensation 02/23/2015  . Heart murmur 12/07/2016  . HTN (hypertension)   . Left hip pain 09/30/2015  . Medicare annual wellness visit, subsequent 04/14/2015  . Pain in the wrist, unspecified laterality 03/03/2014  . Tachycardia    Episodic    Past Surgical History:  Procedure Laterality Date  . APPENDECTOMY  74 yrs old  . BUNIONECTOMY    . WISDOM TOOTH EXTRACTION  74 yrs old    There were no vitals filed for this visit.  Subjective Assessment - 11/23/19 1319    Subjective  Pt reports that her Rt foot has been hurting on the lateral side; come in sharp bursts then goes away quicly. Rt knee is doing "ok". She has continued to stop medications; has doctors approval due to adverse reactions. She is not taking any pain medications for the Rt knee.    Patient Stated Goals  get rid of pain in Rt leg    Currently in Pain?  No/denies    Pain Score  0-No pain    Pain Location  Knee    Pain Orientation  Right    Pain Onset  1 to 4 weeks ago       [facility] Adult PT Treatment/Exercise - 11/23/19 0001      Lumbar Exercises: Stretches   Gastroc Stretch  2 reps;30 seconds   incline board     Knee/Hip  Exercises: Stretches   Active Hamstring Stretch  Right;3 reps;30 seconds   on 12 inch step, hand hold x 2     Knee/Hip Exercises: Aerobic   Nustep  L4; 5 mins      Knee/Hip Exercises: Standing   Terminal Knee Extension  Strengthening;Right;1 set;10 reps;Theraband    Theraband Level (Terminal Knee Extension)  Level 2 (Red)   5 sec hold   Lateral Step Up  1 set;Right;Hand Hold: 1;5 reps   heel taps, discontinued due to increased Rt calf discomfort.   Functional Squat  1 set;10 reps;3 seconds   slow up and down, pausing at each new height, high/low table   SLS  Rt 15 sec x 2, Lt 30 sec x 2, cues for focus on non-moving object   no pain, mod hand touch down on Rt for balance with airex     Knee/Hip Exercises: Supine   Quad Sets  Strengthening;Right;1 set;10 reps   5 sec hold   Heel Slides  AROM;Strengthening;Right;1 set;10 reps   3 sec hold   Terminal Knee Extension  --    Theraband Level (Terminal Knee Extension)  --  Bridges  Strengthening;Both;10 reps;1 set   5 sec hold   Straight Leg Raises  1 set;5 reps;Strengthening;Right   no pain, ER trial OK     Cryotherapy   Number Minutes Cryotherapy  10 Minutes    Cryotherapy Location  Knee   2 lb weight   Type of Cryotherapy  Ice pack             PT Education - 11/23/19 1411    Education Details  Ice at home    Person(s) Educated  Patient    Methods  Explanation    Comprehension  Verbalized understanding          PT Long Term Goals - 11/23/19 1413      PT LONG TERM GOAL #1   Title  Decrease pain Rt LE by 50-75% allowing patient to participate in functional activities    Time  6    Period  Weeks    Status  On-going      PT LONG TERM GOAL #2   Title  Return Rt knee and hip to full pain free ROM - equal to Lt hip and knee    Time  6    Period  Weeks    Status  On-going      PT LONG TERM GOAL #3   Title  Increase hamstring flexibility Rt to equal Lt    Time  6    Period  Weeks    Status  On-going       PT LONG TERM GOAL #4   Title  Independent in HEP    Time  6    Period  Weeks    Status  On-going      PT LONG TERM GOAL #5   Title  Improve FOTO to </= 33% limitation    Time  6    Period  Weeks    Status  On-going            Plan - 11/23/19 1406    Clinical Impression Statement  Pt did not tolerate lateral step downs on the Rt today; she had sharp increase in Rt calf discomfort with attempts to ascend back up step. All other exercises tolerated well. Pt has difficulty differenciating between pain and stretching sensation during exercise. No swelling present at beginning of session; mild pain resent post ther ex. Pain relieved with ice application. Pt progressing towards LTGs.    Stability/Clinical Decision Making  Stable/Uncomplicated    Rehab Potential  Good    PT Frequency  2x / week    PT Duration  6 weeks    PT Treatment/Interventions  Patient/family education;ADLs/Self Care Home Management;Cryotherapy;Electrical Stimulation;Iontophoresis 4mg /ml Dexamethasone;Moist Heat;Ultrasound;Gait training;Functional mobility training;Therapeutic activities;Therapeutic exercise;Balance training;Neuromuscular re-education;Manual techniques;Dry needling;Taping    PT Next Visit Plan  review HEP; add manual work through the posterior Rt thigh and calf; progress exercises as indicated; modalities as indicated. Assess goals.    PT Home Exercise Plan  [id number]    Consulted and Agree with Plan of Care  Patient       Patient will benefit from skilled therapeutic intervention in order to improve the following deficits and impairments:     Visit Diagnosis: Pain of right lower extremity  Sciatica, right side  Other symptoms and signs involving the musculoskeletal system     Problem List Patient Active Problem List   Diagnosis Date Noted  . Sciatica, right side 10/29/2019  . RUQ pain 07/20/2018  . Influenza A 07/18/2018  . Senile  ecchymosis 02/03/2018  . Atypical chest pain 04/17/2017   . Bronchitis, acute 01/09/2017  . Heart murmur 12/07/2016  . Chronic shoulder pain 12/07/2016  . Epistaxis 09/30/2015  . 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 Left arm pain 09/30/2015  . Left hip pain 09/30/2015  . Generalized osteoarthritis of multiple sites 09/30/2015  . Medicare annual wellness visit, subsequent 04/14/2015  . Globus sensation 02/23/2015  . Osteopenia 04/15/2014  . Prediabetes 04/07/2014  . Pain in the wrist, unspecified laterality 03/03/2014  . Paresthesia of left arm 11/16/2013  . Lateral epicondylitis of left elbow 04/19/2013  . Hematochezia 04/19/2013  . DDD (degenerative disc disease), cervical 05/16/2012  . Preventative health care 03/31/2011  . JOINT PAIN, HAND 07/23/2010  . DISTURBANCE OF SKIN SENSATION 07/04/2009  . Hyperlipidemia, mixed 07/04/2009  . Constipation 12/02/2008  . TENESMUS 12/02/2008  . Tachycardia 11/07/2007  . Essential hypertension 06/01/2007  . ALLERGIC RHINITIS 06/01/2007    Ronaldo Miyamoto, SPTA 11/23/19 2:27 PM  This entire session was performed under direct supervision and direction of a licensed Physical Brewing technologist. I have personally read, edited and approved of the note as written.  Kerin Perna, PTA 11/23/19 2:27 PM    [place] [address] [place], [place], [address] Phone: [phone]   Fax:  [phone]  Name: MAYLA BIDDY MRN: [id number] Date of Birth: 1945-10-24

## 2019-11-28 ENCOUNTER — Ambulatory Visit: Payer: Medicare PPO | Admitting: Physical Therapy

## 2019-11-28 ENCOUNTER — Other Ambulatory Visit: Payer: Self-pay

## 2019-11-28 DIAGNOSIS — R29898 Other symptoms and signs involving the musculoskeletal system: Secondary | ICD-10-CM | POA: Diagnosis not present

## 2019-11-28 DIAGNOSIS — M5431 Sciatica, right side: Secondary | ICD-10-CM | POA: Diagnosis not present

## 2019-11-28 DIAGNOSIS — M79604 Pain in right leg: Secondary | ICD-10-CM

## 2019-11-28 NOTE — Therapy (Signed)
[place] [address], [place], [address] Phone: [phone]   Fax:  [phone]  Physical Therapy Treatment  Patient Details  Name: Eileen Hansen MRN: [id number] Date of Birth: 08-23-1945 Referring Provider (PT): Dr Lynne Leader    Encounter Date: 11/28/2019  PT End of Session - 11/28/19 1352    Visit Number  7    Number of Visits  12    Date for PT Re-Evaluation  12/19/19    PT Start Time  1347    PT Stop Time  1433    PT Time Calculation (min)  46 min    Behavior During Therapy  [facility] for tasks assessed/performed       Past Medical History:  Diagnosis Date  . Allergic rhinitis   . Arthralgia 09/30/2015  . Bronchitis, acute 01/09/2017  . Chronic shoulder pain 12/07/2016  . Epistaxis 09/30/2015  . Globus sensation 02/23/2015  . Heart murmur 12/07/2016  . HTN (hypertension)   . Left hip pain 09/30/2015  . Medicare annual wellness visit, subsequent 04/14/2015  . Pain in the wrist, unspecified laterality 03/03/2014  . Tachycardia    Episodic    Past Surgical History:  Procedure Laterality Date  . APPENDECTOMY  74 yrs old  . BUNIONECTOMY    . WISDOM TOOTH EXTRACTION  74 yrs old    There were no vitals filed for this visit.  Subjective Assessment - 11/28/19 1353    Subjective  "I think it is getting a little better".  She remarks that her Rt knee "gets hot" occasionally.  She still "feels apprehensive" to use Rt knee; doesn't trust it, fearful it will buckle.    Patient Stated Goals  get rid of pain in Rt leg    Currently in Pain?  No/denies    Pain Score  0-No pain    Pain Location  --   Rt calf - up to 5/10 at night occasionally.   Pain Onset  1 to 4 weeks ago         [facility] PT Assessment - 11/28/19 0001      Assessment   Medical Diagnosis  Rt sciatica    Referring Provider (PT)  Dr Lynne Leader     Onset Date/Surgical Date  10/17/19    Hand Dominance  Right    Next MD Visit  PRN     Prior Therapy   none      Flexibility   Quadriceps  Lt heel to buttocks; Rt - heel 4" to buttocks.         [facility] Adult PT Treatment/Exercise - 11/28/19 0001      Knee/Hip Exercises: Stretches   Quad Stretch  Left;1 rep;Right;3 reps;20 seconds   prone with strap   Gastroc Stretch  Both;2 reps;20 seconds   incline board.      Knee/Hip Exercises: Aerobic   Nustep  L4; 5 mins   cues to increase speed     Knee/Hip Exercises: Standing   Terminal Knee Extension  Strengthening;Right;1 set;10 reps;Theraband    Theraband Level (Terminal Knee Extension)  Level 2 (Red)   5 sec hold   Lateral Step Up  Left;1 set;10 reps;Hand Hold: 1;Step Height: 6"    Step Down  Left;2 sets;Hand Hold: 2;Step Height: 4";10 reps;Step Height: 6";5 reps      Manual Therapy   Manual therapy comments  I strip of reg Rock tape applied to medial and lateral Rt knee in horseshoe shape around patella, to  decompress tissue, decrease pain, and increase proprioception.     Soft tissue mobilization  STM to Rt gastroc/soleus, Rt lateral hamstring (mid to distal)                  PT Long Term Goals - 11/28/19 1708      PT LONG TERM GOAL #1   Title  Decrease pain Rt LE by 50-75% allowing patient to participate in functional activities    Time  6    Period  Weeks    Status  Achieved      PT LONG TERM GOAL #2   Title  Return Rt knee and hip to full pain free ROM - equal to Lt hip and knee    Time  6    Period  Weeks    Status  On-going      PT LONG TERM GOAL #3   Title  Increase hamstring flexibility Rt to equal Lt    Time  6    Period  Weeks    Status  On-going      PT LONG TERM GOAL #4   Title  Independent in HEP    Time  6    Period  Weeks    Status  On-going      PT LONG TERM GOAL #5   Title  Improve FOTO to </= 33% limitation    Time  6    Period  Weeks    Status  On-going            Plan - 11/28/19 1431    Clinical Impression Statement  Palpable tightness in Rt soleus and Rt lateral hamstring  (mid); tender with STM.  Pt required cues for Rt quad set with resisted TKE.   Pt's Rt quad tighter than LLE.  Pt given encouragement not to avoid TKE when in stance phase of gait and during exercises. Pt has met LTG#1 with reduction of overall pain level.  Progressing well towards remaining goals.    Stability/Clinical Decision Making  Stable/Uncomplicated    Rehab Potential  Good    PT Frequency  2x / week    PT Duration  6 weeks    PT Treatment/Interventions  Patient/family education;ADLs/Self Care Home Management;Cryotherapy;Electrical Stimulation;Iontophoresis 12m/ml Dexamethasone;Moist Heat;Ultrasound;Gait training;Functional mobility training;Therapeutic activities;Therapeutic exercise;Balance training;Neuromuscular re-education;Manual techniques;Dry needling;Taping    PT Next Visit Plan  continue progressive stretching, and LE strengthening; gait.    PT Home Exercise Plan  [id number]    Consulted and Agree with Plan of Care  Patient       Patient will benefit from skilled therapeutic intervention in order to improve the following deficits and impairments:     Visit Diagnosis: Pain of right lower extremity  Sciatica, right side  Other symptoms and signs involving the musculoskeletal system     Problem List Patient Active Problem List   Diagnosis Date Noted  . Sciatica, right side 10/29/2019  . RUQ pain 07/20/2018  . Influenza A 07/18/2018  . Senile ecchymosis 02/03/2018  . Atypical chest pain 04/17/2017  . Bronchitis, acute 01/09/2017  . Heart murmur 12/07/2016  . Chronic shoulder pain 12/07/2016  . Epistaxis 09/30/2015  . 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 Left arm pain 09/30/2015  . Left hip pain 09/30/2015  . Generalized osteoarthritis of multiple sites 09/30/2015  . Medicare annual wellness visit, subsequent 04/14/2015  . Globus sensation 02/23/2015  . Osteopenia 04/15/2014  . Prediabetes 04/07/2014  . Pain in the wrist, unspecified laterality 03/03/2014  .  Paresthesia of left arm 11/16/2013  . Lateral epicondylitis of left elbow 04/19/2013  . Hematochezia 04/19/2013  . DDD (degenerative disc disease), cervical 05/16/2012  . Preventative health care 03/31/2011  . JOINT PAIN, HAND 07/23/2010  . DISTURBANCE OF SKIN SENSATION 07/04/2009  . Hyperlipidemia, mixed 07/04/2009  . Constipation 12/02/2008  . TENESMUS 12/02/2008  . Tachycardia 11/07/2007  . Essential hypertension 06/01/2007  . ALLERGIC RHINITIS 06/01/2007   Kerin Perna, PTA 11/28/19 5:11 PM  [place], [place], [address] Phone: [phone]   Fax:  [phone]  Name: Eileen Hansen MRN: [id number] Date of Birth: 08/01/1945

## 2019-11-30 ENCOUNTER — Ambulatory Visit (INDEPENDENT_AMBULATORY_CARE_PROVIDER_SITE_OTHER): Payer: Medicare PPO | Admitting: Physical Therapy

## 2019-11-30 ENCOUNTER — Other Ambulatory Visit: Payer: Self-pay

## 2019-11-30 ENCOUNTER — Encounter: Payer: Self-pay | Admitting: Physical Therapy

## 2019-11-30 DIAGNOSIS — M79604 Pain in right leg: Secondary | ICD-10-CM | POA: Diagnosis not present

## 2019-11-30 DIAGNOSIS — R29898 Other symptoms and signs involving the musculoskeletal system: Secondary | ICD-10-CM

## 2019-11-30 DIAGNOSIS — M5431 Sciatica, right side: Secondary | ICD-10-CM | POA: Diagnosis not present

## 2019-11-30 NOTE — Therapy (Signed)
[facility] [place] [address] [place], [place], [address] Phone: [phone]   Fax:  [phone]  Physical Therapy Treatment  Patient Details  Name: Eileen Hansen MRN: [id number] Date of Birth: Feb 15, 1946 Referring Provider (PT): Dr Clementeen Graham    Encounter Date: 11/30/2019  PT End of Session - 11/30/19 1320    Visit Number  8    Number of Visits  12    Date for PT Re-Evaluation  12/19/19    PT Start Time  1317    PT Stop Time  1358    PT Time Calculation (min)  41 min    Activity Tolerance  Patient tolerated treatment well    Behavior During Therapy  [facility] for tasks assessed/performed       Past Medical History:  Diagnosis Date  . Allergic rhinitis   . Arthralgia 09/30/2015  . Bronchitis, acute 01/09/2017  . Chronic shoulder pain 12/07/2016  . Epistaxis 09/30/2015  . Globus sensation 02/23/2015  . Heart murmur 12/07/2016  . HTN (hypertension)   . Left hip pain 09/30/2015  . Medicare annual wellness visit, subsequent 04/14/2015  . Pain in the wrist, unspecified laterality 03/03/2014  . Tachycardia    Episodic    Past Surgical History:  Procedure Laterality Date  . APPENDECTOMY  74 yrs old  . BUNIONECTOMY    . WISDOM TOOTH EXTRACTION  74 yrs old    There were no vitals filed for this visit.  Subjective Assessment - 11/30/19 1320    Subjective  "I believe my knee is getting better".  Pt reports her Rt knee is no longer waking her in the night.  She notes 60% improvement in RLE since starting therapy.  Stairs and transitions in/out of car can still be problematic.    Patient Stated Goals  get rid of pain in Rt leg    Currently in Pain?  No/denies    Pain Score  0-No pain    Pain Onset  1 to 4 weeks ago         [facility] PT Assessment - 11/30/19 0001      Assessment   Medical Diagnosis  Rt sciatica    Referring Provider (PT)  Dr Clementeen Graham     Onset Date/Surgical Date  10/17/19    Hand Dominance  Right    Next MD  Visit  PRN     Prior Therapy  none      AROM   Right/Left Knee  Right;Left    Right Knee Extension  -3   lacking 3 deg    Right Knee Flexion  143    Left Knee Extension  0    Left Knee Flexion  147    Lumbar Flexion  WNL    Lumbar Extension  WNL    Lumbar - Right Side Bend  WNL    Lumbar - Left Side Bend  WNL    Lumbar - Right Rotation  50%     Lumbar - Left Rotation  50%      Flexibility   Hamstrings  Lt 88 deg; Rt 85 deg   opp knee flexed.    Quadriceps  Rt heel 3" to buttocks; Lt heel touching buttocks       [facility] Adult PT Treatment/Exercise - 11/30/19 0001      Lumbar Exercises: Stretches   Passive Hamstring Stretch  Left;Right;2 reps;30 seconds   supine with strap   Lower Trunk Rotation  3 reps;10 seconds  arms in T     Knee/Hip Exercises: Stretches   Personal assistant reps;Left;1 rep;30 seconds   prone with strap   Piriformis Stretch  Right;Left;2 reps;20 seconds   supine, fig 4, knee towards opp shoulder   Gastroc Stretch  Both;2 reps;20 seconds   incline board.      Knee/Hip Exercises: Aerobic   Nustep  L5; 5 mins   cues to increase speed     Knee/Hip Exercises: Standing   Forward Step Up  Right;1 set;10 reps;Hand Hold: 2;Step Height: 6"      Knee/Hip Exercises: Seated   Sit to Sand  1 set;5 reps;with UE support   Rt foot back, eccentric lowering     Knee/Hip Exercises: Supine   Quad Sets  Strengthening;Right;1 set;10 reps    Bridges  1 set;10 reps    Straight Leg Raises  Right;1 set;10 reps    Straight Leg Raises Limitations  cues for quad set each rep.       Knee/Hip Exercises: Sidelying   Hip ABduction  Strengthening;Right;1 set;10 reps    Other Sidelying Knee/Hip Exercises  open book x 5 reps each side for lumbar flexibility.                   PT Long Term Goals - 11/28/19 1708      PT LONG TERM GOAL #1   Title  Decrease pain Rt LE by 50-75% allowing patient to participate in functional activities    Time  6    Period   Weeks    Status  Achieved      PT LONG TERM GOAL #2   Title  Return Rt knee and hip to full pain free ROM - equal to Lt hip and knee    Time  6    Period  Weeks    Status  On-going      PT LONG TERM GOAL #3   Title  Increase hamstring flexibility Rt to equal Lt    Time  6    Period  Weeks    Status  On-going      PT LONG TERM GOAL #4   Title  Independent in HEP    Time  6    Period  Weeks    Status  On-going      PT LONG TERM GOAL #5   Title  Improve FOTO to </= 33% limitation    Time  6    Period  Weeks    Status  On-going            Plan - 11/30/19 1359    Clinical Impression Statement  Pt's Rt knee ROM has improved, however continues to be limited with Rt knee ext.  Rt quad and hamstring tighter than Lt.  Pt tolerated exercises better than in previous sessions, with less report of pain.  Progressing well towards goals.    Stability/Clinical Decision Making  Stable/Uncomplicated    Rehab Potential  Good    PT Frequency  2x / week    PT Duration  6 weeks    PT Treatment/Interventions  Patient/family education;ADLs/Self Care Home Management;Cryotherapy;Electrical Stimulation;Iontophoresis 4mg /ml Dexamethasone;Moist Heat;Ultrasound;Gait training;Functional mobility training;Therapeutic activities;Therapeutic exercise;Balance training;Neuromuscular re-education;Manual techniques;Dry needling;Taping    PT Next Visit Plan  continue progressive LE flexibility and strengthening.    PT Home Exercise Plan  [id number]    Consulted and Agree with Plan of Care  Patient       Patient will benefit from skilled  therapeutic intervention in order to improve the following deficits and impairments:     Visit Diagnosis: Pain of right lower extremity  Sciatica, right side  Other symptoms and signs involving the musculoskeletal system     Problem List Patient Active Problem List   Diagnosis Date Noted  . Sciatica, right side 10/29/2019  . RUQ pain 07/20/2018  . Influenza A  07/18/2018  . Senile ecchymosis 02/03/2018  . Atypical chest pain 04/17/2017  . Bronchitis, acute 01/09/2017  . Heart murmur 12/07/2016  . Chronic shoulder pain 12/07/2016  . Epistaxis 09/30/2015  . 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 Left arm pain 09/30/2015  . Left hip pain 09/30/2015  . Generalized osteoarthritis of multiple sites 09/30/2015  . Medicare annual wellness visit, subsequent 04/14/2015  . Globus sensation 02/23/2015  . Osteopenia 04/15/2014  . Prediabetes 04/07/2014  . Pain in the wrist, unspecified laterality 03/03/2014  . Paresthesia of left arm 11/16/2013  . Lateral epicondylitis of left elbow 04/19/2013  . Hematochezia 04/19/2013  . DDD (degenerative disc disease), cervical 05/16/2012  . Preventative health care 03/31/2011  . JOINT PAIN, HAND 07/23/2010  . DISTURBANCE OF SKIN SENSATION 07/04/2009  . Hyperlipidemia, mixed 07/04/2009  . Constipation 12/02/2008  . TENESMUS 12/02/2008  . Tachycardia 11/07/2007  . Essential hypertension 06/01/2007  . ALLERGIC RHINITIS 06/01/2007   Mayer Camel, PTA 11/30/19 2:03 PM [facility] [place] [address] [place], [place], [address] Phone: [phone]   Fax:  [phone]  Name: AN LANNAN MRN: [id number] Date of Birth: Nov 20, 1945

## 2019-12-04 ENCOUNTER — Encounter: Payer: Self-pay | Admitting: Physical Therapy

## 2019-12-04 ENCOUNTER — Ambulatory Visit: Payer: Medicare PPO | Admitting: Physical Therapy

## 2019-12-04 ENCOUNTER — Other Ambulatory Visit: Payer: Self-pay

## 2019-12-04 DIAGNOSIS — R29898 Other symptoms and signs involving the musculoskeletal system: Secondary | ICD-10-CM | POA: Diagnosis not present

## 2019-12-04 DIAGNOSIS — M5431 Sciatica, right side: Secondary | ICD-10-CM | POA: Diagnosis not present

## 2019-12-04 DIAGNOSIS — M79604 Pain in right leg: Secondary | ICD-10-CM

## 2019-12-04 NOTE — Therapy (Signed)
[place], [place], [address] Phone: [phone]   Fax:  [phone]  Physical Therapy Treatment  Patient Details  Name: Eileen Hansen MRN: [id number] Date of Birth: 1946-05-20 Referring Provider (PT): Dr Lynne Leader    Encounter Date: 12/04/2019  PT End of Session - 12/04/19 1111    Visit Number  9    Number of Visits  12    Date for PT Re-Evaluation  12/19/19    PT Start Time  1105    PT Stop Time  1144    PT Time Calculation (min)  39 min    Activity Tolerance  Patient tolerated treatment well    Behavior During Therapy  [facility] for tasks assessed/performed       Past Medical History:  Diagnosis Date  . Allergic rhinitis   . Arthralgia 09/30/2015  . Bronchitis, acute 01/09/2017  . Chronic shoulder pain 12/07/2016  . Epistaxis 09/30/2015  . Globus sensation 02/23/2015  . Heart murmur 12/07/2016  . HTN (hypertension)   . Left hip pain 09/30/2015  . Medicare annual wellness visit, subsequent 04/14/2015  . Pain in the wrist, unspecified laterality 03/03/2014  . Tachycardia    Episodic    Past Surgical History:  Procedure Laterality Date  . APPENDECTOMY  74 yrs old  . BUNIONECTOMY    . WISDOM TOOTH EXTRACTION  74 yrs old    There were no vitals filed for this visit.  Subjective Assessment - 12/04/19 1111    Subjective  Pt reports her Rt lower leg was tired and sore when operating new vacuum cleaner yesterday.  She is wondering if she should ask for an MRI of her leg. She is no longer having pain in LE that wakes her up.    Currently in Pain?  No/denies    Pain Score  0-No pain         [facility] PT Assessment - 12/04/19 0001      Assessment   Medical Diagnosis  Rt sciatica    Referring Provider (PT)  Dr Lynne Leader     Onset Date/Surgical Date  10/17/19    Hand Dominance  Right    Next MD Visit  PRN     Prior Therapy  none      Observation/Other Assessments   Focus on Therapeutic Outcomes  (FOTO)   37% limitation,  33% goal.       Flexibility   Quadriceps  Rt heel 2.5" to buttocks; Lt heel touching buttocks       [facility] Adult PT Treatment/Exercise - 12/04/19 0001                   Lumbar Exercises: Aerobic   Nustep  L5: 4.5 min       Knee/Hip Exercises: Stretches   Sports administrator  Right;3 reps;30 seconds   prone with strap   Gastroc Stretch  Both;2 reps;30 seconds    Soleus Stretch  Both;2 reps;30 seconds      Knee/Hip Exercises: Aerobic   Nustep  L5; 5.5 mins (legs only )   cues to increase speed     Knee/Hip Exercises: Standing   Terminal Knee Extension  Both;10 reps   tactile cues and mirror for visual feedback   Forward Step Up  Right;1 set;10 reps;Hand Hold: 2;Step Height: 6"    Step Down  Left;2 sets;5 reps;10 reps;Hand Hold: 2;Step Height: 4";Step Height: 6"      Manual Therapy   Soft  tissue mobilization  STM to Rt gastroc/soleus, Rt lateral hamstring (mid to distal)                  PT Long Term Goals - 12/04/19 1145      PT LONG TERM GOAL #1   Title  Decrease pain Rt LE by 50-75% allowing patient to participate in functional activities    Time  6    Period  Weeks    Status  Achieved      PT LONG TERM GOAL #2   Title  Return Rt knee and hip to full pain free ROM - equal to Lt hip and knee    Time  6    Period  Weeks    Status  Partially Met      PT LONG TERM GOAL #3   Title  Increase hamstring flexibility Rt to equal Lt    Time  6    Period  Weeks    Status  On-going      PT LONG TERM GOAL #4   Title  Independent in HEP    Time  6    Period  Weeks    Status  On-going      PT LONG TERM GOAL #5   Title  Improve FOTO to </= 33% limitation    Time  6    Period  Weeks    Status  On-going            Plan - 12/04/19 1129    Clinical Impression Statement  Pt continues to have difficulty performing and tolerating Rt TKE.  Hamstring flexibility very similar on each leg.  Rt quad flexibility is improving each visit; pt  complains of some discomfort in ant quad during prone quuad stretch. Palpable tightness noted in Rt lateral soleus and Rt high lateral hamstring; improved after STM. Pt making good gains with each visit.     Stability/Clinical Decision Making  Stable/Uncomplicated    Rehab Potential  Good    PT Frequency  2x / week    PT Duration  6 weeks    PT Treatment/Interventions  Patient/family education;ADLs/Self Care Home Management;Cryotherapy;Electrical Stimulation;Iontophoresis 98m/ml Dexamethasone;Moist Heat;Ultrasound;Gait training;Functional mobility training;Therapeutic activities;Therapeutic exercise;Balance training;Neuromuscular re-education;Manual techniques;Dry needling;Taping    PT Next Visit Plan  10th visit note.    PT Home Exercise Plan  [id number]    Consulted and Agree with Plan of Care  Patient       Patient will benefit from skilled therapeutic intervention in order to improve the following deficits and impairments:     Visit Diagnosis: Pain of right lower extremity  Sciatica, right side  Other symptoms and signs involving the musculoskeletal system     Problem List Patient Active Problem List   Diagnosis Date Noted  . Sciatica, right side 10/29/2019  . RUQ pain 07/20/2018  . Influenza A 07/18/2018  . Senile ecchymosis 02/03/2018  . Atypical chest pain 04/17/2017  . Bronchitis, acute 01/09/2017  . Heart murmur 12/07/2016  . Chronic shoulder pain 12/07/2016  . Epistaxis 09/30/2015  . 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 Left arm pain 09/30/2015  . Left hip pain 09/30/2015  . Generalized osteoarthritis of multiple sites 09/30/2015  . Medicare annual wellness visit, subsequent 04/14/2015  . Globus sensation 02/23/2015  . Osteopenia 04/15/2014  . Prediabetes 04/07/2014  . Pain in the wrist, unspecified laterality 03/03/2014  . Paresthesia of left arm 11/16/2013  . Lateral epicondylitis of left elbow 04/19/2013  . Hematochezia 04/19/2013  .  DDD (degenerative disc  disease), cervical 05/16/2012  . Preventative health care 03/31/2011  . JOINT PAIN, HAND 07/23/2010  . DISTURBANCE OF SKIN SENSATION 07/04/2009  . Hyperlipidemia, mixed 07/04/2009  . Constipation 12/02/2008  . TENESMUS 12/02/2008  . Tachycardia 11/07/2007  . Essential hypertension 06/01/2007  . ALLERGIC RHINITIS 06/01/2007    Kerin Perna, PTA 12/04/19 9:11 PM  [facility] [place], [place], [address] Phone: [phone]   Fax:  [phone]  Name: Eileen Hansen MRN: [id number] Date of Birth: 1946-06-10

## 2019-12-07 ENCOUNTER — Encounter: Payer: Self-pay | Admitting: Rehabilitative and Restorative Service Providers"

## 2019-12-07 ENCOUNTER — Ambulatory Visit: Payer: Medicare PPO | Admitting: Rehabilitative and Restorative Service Providers"

## 2019-12-07 ENCOUNTER — Other Ambulatory Visit: Payer: Self-pay

## 2019-12-07 DIAGNOSIS — R29898 Other symptoms and signs involving the musculoskeletal system: Secondary | ICD-10-CM

## 2019-12-07 DIAGNOSIS — M79604 Pain in right leg: Secondary | ICD-10-CM | POA: Diagnosis not present

## 2019-12-07 DIAGNOSIS — M5431 Sciatica, right side: Secondary | ICD-10-CM

## 2019-12-07 NOTE — Therapy (Addendum)
[place], [place], [address] Phone: [phone]   Fax:  [phone]  Physical Therapy Treatment  10th Visit Progress Note Reporting Period 11/07/19 to 12/07/19  See note below for Objective Data and Assessment of Progress/Goals.       Patient Details  Name: Eileen Hansen MRN: [id number] Date of Birth: [birth year] Referring Provider (PT): Dr Lynne Leader    Encounter Date: 12/07/2019  PT End of Session - 12/07/19 1320    Visit Number  10    Number of Visits  12    Date for PT Re-Evaluation  12/19/19    PT Start Time  7048    PT Stop Time  1400    PT Time Calculation (Hansen)  42 Hansen    Activity Tolerance  Patient tolerated treatment well       Past Medical History:  Diagnosis Date  . Allergic rhinitis   . Arthralgia 09/30/2015  . Bronchitis, acute 01/09/2017  . Chronic shoulder pain 12/07/2016  . Epistaxis 09/30/2015  . Globus sensation 02/23/2015  . Heart murmur 12/07/2016  . HTN (hypertension)   . Left hip pain 09/30/2015  . Medicare annual wellness visit, subsequent 04/14/2015  . Pain in the wrist, unspecified laterality 03/03/2014  . Tachycardia    Episodic    Past Surgical History:  Procedure Laterality Date  . APPENDECTOMY  74 yrs old  . BUNIONECTOMY    . WISDOM TOOTH EXTRACTION  74 yrs old    There were no vitals filed for this visit.  Subjective Assessment - 12/07/19 1321    Subjective  Patient reports that her Rt leg has been really bad since Wednesday. May have been increased exercises in therapy. She had increased pain Wednesday with walking and it stayed irritated yesterday and last night.    Currently in Pain?  Yes    Pain Score  8     Pain Location  Knee    Pain Orientation  Right    Pain Descriptors / Indicators  Tightness    Pain Type  Acute pain    Pain Radiating Towards  back of the Rt knee    Pain Onset  More than a month ago    Pain Frequency  Intermittent     Aggravating Factors   walking; standing; stairs    Pain Relieving Factors  sitting; resting         [facility] PT Assessment - 12/07/19 0001      Assessment   Medical Diagnosis  Rt sciatica    Referring Provider (PT)  Dr Lynne Leader     Onset Date/Surgical Date  10/17/19    Hand Dominance  Right    Next MD Visit  PRN     Prior Therapy  none      Observation/Other Assessments   Focus on Therapeutic Outcomes (FOTO)   37% limitation       Observation/Other Assessments-Edema    Edema  --   edema Rt knee - knee warm to touch      Sensation   Additional Comments  WFL's per pt report       AROM   Right Knee Extension  -3    Right Knee Flexion  124    Left Knee Extension  0    Left Knee Flexion  147    Lumbar Flexion  WNL    Lumbar Extension  WNL    Lumbar - Right Side Bend  WNL  Lumbar - Left Side Bend  WNL    Lumbar - Right Rotation  50%     Lumbar - Left Rotation  50%      Strength   Right Knee Flexion  4+/5   pain with resistive testing    Right Knee Extension  4+/5   pain with resistive testing    Left Knee Flexion  5/5    Left Knee Extension  5/5      Flexibility   Hamstrings  Lt 88 deg; Rt 83 deg knee in slight flexion    opposite knee bent    Quadriceps  Rt heel 2.5" to buttocks; Lt heel touching buttocks      Palpation   Spinal mobility  WFL's     Palpation comment  tenderness and tightness Rt posterior calf/knee/thigh      Ambulation/Gait   Gait Comments  ambulates with limp Rt LE in wt bearing Rt                     [facility] Adult PT Treatment/Exercise - 12/07/19 0001      Lumbar Exercises: Stretches   Passive Hamstring Stretch  Right;3 reps;30 seconds   supine with strap      Lumbar Exercises: Aerobic   Nustep  L5: 6 Hansen       Knee/Hip Exercises: Stretches   Press photographer  Both;2 reps;30 seconds    Soleus Stretch  Both;2 reps;30 seconds      Knee/Hip Exercises: Supine   Short Arc Quad Sets  AROM;Right;10 reps    Terminal Knee  Extension  AROM;Right;5 reps    Bridges  Strengthening;Both;10 reps    Straight Leg Raises  Right;1 set;10 reps    Straight Leg Raise with External Rotation  AROM;Right;10 reps    Other Supine Knee/Hip Exercises  hip abduction in hooklying x 10       Knee/Hip Exercises: Sidelying   Hip ABduction Limitations  painful - stopped       Cryotherapy   Number Minutes Cryotherapy  10 Minutes    Cryotherapy Location  Knee   Rt    Type of Cryotherapy  Ice pack      Electrical Stimulation   Electrical Stimulation Location  Rt knee     Electrical Stimulation Action  TENS    Electrical Stimulation Parameters  to tolerance    Electrical Stimulation Goals  Pain;Edema             PT Education - 12/07/19 1359    Education Details  TENS; discussion of referred pain and where the leg pain msy be coming from; suggested she see MD again    Person(s) Educated  Patient    Methods  Explanation    Comprehension  Verbalized understanding          PT Long Term Goals - 12/07/19 1404      PT LONG TERM GOAL #1   Title  Decrease pain Rt LE by 50-75% allowing patient to participate in functional activities    Time  6    Period  Weeks    Status  On-going      PT LONG TERM GOAL #2   Title  Return Rt knee and hip to full pain free ROM - equal to Lt hip and knee    Time  6    Period  Weeks    Status  On-going      PT LONG TERM GOAL #3   Title  Increase  hamstring flexibility Rt to equal Lt    Time  6    Period  Weeks    Status  On-going      PT LONG TERM GOAL #4   Title  Independent in HEP    Time  6    Period  Weeks    Status  On-going      PT LONG TERM GOAL #5   Title  Improve FOTO to </= 33% limitation    Time  6    Period  Weeks    Status  On-going            Plan - 12/07/19 1400    Clinical Impression Statement  Flare up of pain in the past 2 days - possibly related to increased exercise in PT as well as increased activity at home - doing a lot more walking.  Re-evaluation suggests possible Rt knee pathology creating referred pain into the Rt calf and posterior knee/thigh. Patient unable to perform all exercises in clinic today. Note edema and increased temp Rt knee; decreased ROM Rt knee; pain with palpation through the knee/hamstrings/quad/gastroc/soleus musculature. Patient may benefit from f/u with MD to assess Rt knee.    Rehab Potential  Good    PT Frequency  2x / week    PT Duration  6 weeks    PT Treatment/Interventions  Patient/family education;ADLs/Self Care Home Management;Cryotherapy;Electrical Stimulation;Iontophoresis 61m/ml Dexamethasone;Moist Heat;Ultrasound;Gait training;Functional mobility training;Therapeutic activities;Therapeutic exercise;Balance training;Neuromuscular re-education;Manual techniques;Dry needling;Taping    PT Next Visit Plan  continue progressive LE flexibility and strengthening as indicated; note to MD    PT Home Exercise Plan  [id number]    Consulted and Agree with Plan of Care  Patient       Patient will benefit from skilled therapeutic intervention in order to improve the following deficits and impairments:     Visit Diagnosis: Pain of right lower extremity  Sciatica, right side  Other symptoms and signs involving the musculoskeletal system     Problem List Patient Active Problem List   Diagnosis Date Noted  . Sciatica, right side 10/29/2019  . RUQ pain 07/20/2018  . Influenza A 07/18/2018  . Senile ecchymosis 02/03/2018  . Atypical chest pain 04/17/2017  . Bronchitis, acute 01/09/2017  . Heart murmur 12/07/2016  . Chronic shoulder pain 12/07/2016  . Epistaxis 09/30/2015  . 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 Left arm pain 09/30/2015  . Left hip pain 09/30/2015  . Generalized osteoarthritis of multiple sites 09/30/2015  . Medicare annual wellness visit, subsequent 04/14/2015  . Globus sensation 02/23/2015  . Osteopenia 04/15/2014  . Prediabetes 04/07/2014  . Pain in the wrist, unspecified  laterality 03/03/2014  . Paresthesia of left arm 11/16/2013  . Lateral epicondylitis of left elbow 04/19/2013  . Hematochezia 04/19/2013  . DDD (degenerative disc disease), cervical 05/16/2012  . Preventative health care 03/31/2011  . JOINT PAIN, HAND 07/23/2010  . DISTURBANCE OF SKIN SENSATION 07/04/2009  . Hyperlipidemia, mixed 07/04/2009  . Constipation 12/02/2008  . TENESMUS 12/02/2008  . Tachycardia 11/07/2007  . Essential hypertension 06/01/2007  . ALLERGIC RHINITIS 06/01/2007    Tyquarius Paglia PNilda SimmerPT, MPH  12/07/2019, 2:18 PM  [facility] [address]Phone: [phone]  Fax:  [phone] Name: JSUSANNE BAUMGARNERMRN: [id number] of Birth: [birth year] PHYSICAL THERAPY DISCHARGE SUMMARY  Visits from Start of Care: 10  Current functional level related to goals / functional outcomes: See last  progress note for discharge status    Remaining deficits: Continued pain in the calf - not fully resolved with therapy and HEP    Education / Equipment: HEP  Plan: Patient agrees to discharge.  Patient goals were partially met. Patient is being discharged due to                                                     ?????    Patient was planning to return to MD   [place]. Helene Kelp PT, MPH 01/17/20 4:05 PM

## 2019-12-07 NOTE — Patient Instructions (Signed)

## 2019-12-20 ENCOUNTER — Emergency Department (INDEPENDENT_AMBULATORY_CARE_PROVIDER_SITE_OTHER): Payer: Medicare PPO

## 2019-12-20 ENCOUNTER — Other Ambulatory Visit: Payer: Self-pay

## 2019-12-20 ENCOUNTER — Encounter: Payer: Self-pay | Admitting: Family Medicine

## 2019-12-20 ENCOUNTER — Emergency Department
Admission: EM | Admit: 2019-12-20 | Discharge: 2019-12-20 | Disposition: A | Discharge status: Home / Self Care | Payer: Medicare PPO | Source: Home / Self Care

## 2019-12-20 DIAGNOSIS — M25561 Pain in right knee: Secondary | ICD-10-CM

## 2019-12-20 DIAGNOSIS — G8929 Other chronic pain: Secondary | ICD-10-CM

## 2019-12-20 DIAGNOSIS — M25461 Effusion, right knee: Secondary | ICD-10-CM

## 2019-12-20 MED ORDER — MELOXICAM 7.5 MG PO TABS: 7.5000 mg | ORAL_TABLET | Freq: Every day | ORAL | 0 refills | Status: DC

## 2019-12-20 NOTE — ED Triage Notes (Signed)
Patient presents to Urgent Care with complaints of right knee pain since 3 months ago. Patient reports she feels like there is fluid on both sides of her knee, has been seen for it several times in the past. Called a nurse line and was told to come to [facility] for evaluation.

## 2019-12-20 NOTE — Discharge Instructions (Signed)
  Meloxicam (Mobic) is an antiinflammatory to help with pain and inflammation.  Do not take ibuprofen, Advil, Aleve, or any other medications that contain NSAIDs while taking meloxicam as this may cause stomach upset or even ulcers if taken in large amounts for an extended period of time.   Try to keep your leg elevated and apply a cool compress 2-3 times daily for 10-20 minutes at a time to help with pain and swelling.  Call tomorrow to schedule a follow up appointment with Dr. Benjamin Stain (Dr. Karie Schwalbe), sports medicine for further evaluation and treatment. He may be able to drain the fluid off your knee if the swelling persists.

## 2019-12-20 NOTE — ED Provider Notes (Signed)
[facility]    CSN: [id number] Arrival date & time: 12/20/19  1837      History   Chief Complaint Chief Complaint  Patient presents with  . Knee Pain    HPI Eileen Hansen is a 74 y.o. female.   HPI  Eileen Hansen is a 74 y.o. female presenting to UC with c/o Right leg pain that started about 3 months ago with a dx of sciatica, developed into Right knee pain and swelling. Pain started in the back of her leg. She had an ultrasound, which ruled out a DVT. She was prescribed gabapentin and ibuprofen but no relief. She was referred to PT, last visit was about 2 weeks ago but knee swelling and pain has persisted for about 1 month.  Pain is aching and sore.     Past Medical History:  Diagnosis Date  . Allergic rhinitis   . Arthralgia 09/30/2015  . Bronchitis, acute 01/09/2017  . Chronic shoulder pain 12/07/2016  . Epistaxis 09/30/2015  . Globus sensation 02/23/2015  . Heart murmur 12/07/2016  . HTN (hypertension)   . Left hip pain 09/30/2015  . Medicare annual wellness visit, subsequent 04/14/2015  . Pain in the wrist, unspecified laterality 03/03/2014  . Tachycardia    Episodic    Patient Active Problem List   Diagnosis Date Noted  . Sciatica, right side 10/29/2019  . RUQ pain 07/20/2018  . Influenza A 07/18/2018  . Senile ecchymosis 02/03/2018  . Atypical chest pain 04/17/2017  . Bronchitis, acute 01/09/2017  . Heart murmur 12/07/2016  . Chronic shoulder pain 12/07/2016  . Epistaxis 09/30/2015  . 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 Left arm pain 09/30/2015  . Left hip pain 09/30/2015  . Generalized osteoarthritis of multiple sites 09/30/2015  . Medicare annual wellness visit, subsequent 04/14/2015  . Globus sensation 02/23/2015  . Osteopenia 04/15/2014  . Prediabetes 04/07/2014  . Pain in the wrist, unspecified laterality 03/03/2014  . Paresthesia of left arm 11/16/2013  . Lateral epicondylitis of left elbow 04/19/2013  . Hematochezia 04/19/2013  . DDD  (degenerative disc disease), cervical 05/16/2012  . Preventative health care 03/31/2011  . JOINT PAIN, HAND 07/23/2010  . DISTURBANCE OF SKIN SENSATION 07/04/2009  . Hyperlipidemia, mixed 07/04/2009  . Constipation 12/02/2008  . TENESMUS 12/02/2008  . Tachycardia 11/07/2007  . Essential hypertension 06/01/2007  . ALLERGIC RHINITIS 06/01/2007    Past Surgical History:  Procedure Laterality Date  . APPENDECTOMY  74 yrs old  . BUNIONECTOMY    . WISDOM TOOTH EXTRACTION  74 yrs old    OB History    Gravida  1   Para  1   Term      Preterm      AB      Living        SAB      TAB      Ectopic      Multiple      Live Births               Home Medications    Prior to Admission medications   Medication Sig Start Date End Date Taking? Authorizing Provider  albuterol (PROVENTIL HFA;VENTOLIN HFA) 108 (90 Base) MCG/ACT inhaler Inhale 2 puffs into the lungs every 6 (six) hours as needed for wheezing or shortness of breath. 07/18/18   Mosie Lukes, MD  aspirin 81 MG tablet Take 81 mg by mouth daily.    [provider]  Cholecalciferol (VITAMIN D3)  2000 units TABS Take 1 tablet by mouth daily.    [provider]  COLLAGEN PO Take by mouth daily.    [provider]  Cyanocobalamin (VITAMIN B-12) 5000 MCG TBDP Take 1 tablet by mouth daily.    [provider]  hydrochlorothiazide (HYDRODIURIL) 25 MG tablet Take 1 tablet (25 mg total) by mouth daily. 03/06/19   [name], [name], MD  lisinopril (ZESTRIL) 10 MG tablet TAKE TWO TABLETS BY MOUTH DAILY  10/23/19   [name], [name], MD  meloxicam [facility]) 7.5 MG tablet Take 1 tablet (7.5 mg total) by mouth daily after breakfast. 12/20/19   Lurene Shadow, PA-C  metoprolol tartrate (LOPRESSOR) 25 MG tablet TAKE HALF TABLET BY MOUTH TWICE DAILY  09/24/19   [name], [name], MD  Multiple Minerals-Vitamins (CALCIUM CITRATE PLUS PO) Take 2 tablets by mouth daily.    [provider]  Multiple Vitamin  (MULTI-VITAMINS) TABS Take by mouth.    [provider]  omega-3 acid ethyl esters (LOVAZA) 1 g capsule Take 1 capsule (1 g total) by mouth 2 (two) times daily. 10/27/19 01/25/20  [name], [name], MD  predniSONE (DELTASONE) 10 MG tablet Take 3 tablets (30 mg total) by mouth daily with breakfast. Patient not taking: Reported on 11/07/2019 10/29/19   Rodolph Bong, MD  pregabalin (LYRICA) 75 MG capsule Take 1 capsule (75 mg total) by mouth 2 (two) times daily as needed (nerve pain). 11/14/19   Rodolph Bong, MD    Family History Family History  Problem Relation Age of Onset  . Cancer Father        melanoma  . Diabetes Mother   . Hypertension Mother   . Hyperlipidemia Mother   . Stroke Mother   . Heart attack Mother   . Heart attack Brother   . Cancer Brother        pancreatic  . Colon cancer Neg Hx   . Breast cancer Neg Hx   . Coronary artery disease Neg Hx     Social History Social History   Tobacco Use  . Smoking status: Never Smoker  . Smokeless tobacco: Never Used  Substance Use Topics  . Alcohol use: Yes    Comment:  occ  . Drug use: No     Allergies   Fenofibrate and Penicillins   Review of Systems Review of Systems  Musculoskeletal: Positive for arthralgias, gait problem, joint swelling and myalgias. Negative for back pain, neck pain and neck stiffness.  Skin: Negative for color change, rash and wound.     Physical Exam Triage Vital Signs ED Triage Vitals  Enc Vitals Group     BP 12/20/19 1850 (!) 141/93     Pulse Rate 12/20/19 1850 93     Resp 12/20/19 1850 16     Temp 12/20/19 1850 98 F (36.7 C)     Temp Source 12/20/19 1850 Oral     SpO2 12/20/19 1850 98 %     Weight --      Height --      Head Circumference --      Peak Flow --      Pain Score 12/20/19 1848 7     Pain Loc --      Pain Edu? --      Excl. in GC? --    No data found.  Updated Vital Signs BP (!) 141/93 (BP Location: Right Arm)   Pulse 93   Temp 98 F (36.7 C) (Oral)  Resp 16   SpO2 98%   Visual Acuity Right Eye Distance:   Left Eye Distance:   Bilateral Distance:    Right Eye Near:   Left Eye Near:    Bilateral Near:     Physical Exam Vitals and nursing note reviewed.  Constitutional:      Appearance: Normal appearance. She is well-developed.  HENT:     Head: Normocephalic and atraumatic.  Cardiovascular:     Rate and Rhythm: Normal rate.  Pulmonary:     Effort: Pulmonary effort is normal.  Musculoskeletal:        General: Swelling and tenderness present. Normal range of motion.     Cervical back: Normal range of motion.     Comments: Right knee: moderate edema, diffuse tenderness, worst along medial joint space. Increased pain with full flexion. Tenderness to posterior knee and calf, muscle compartments are soft.   Skin:    General: Skin is warm and dry.     Findings: No bruising or erythema.  Neurological:     Mental Status: She is alert and oriented to person, place, and time.  Psychiatric:        Behavior: Behavior normal.      UC Treatments / Results  Labs (all labs ordered are listed, but only abnormal results are displayed) Labs Reviewed - No data to display  EKG   Radiology DG Knee Complete 4 Views Right  Result Date: 12/20/2019 CLINICAL DATA:  Medial right knee pain for 2 months EXAM: RIGHT KNEE - COMPLETE 4+ VIEW COMPARISON:  None. FINDINGS: No acute fracture or dislocation. Mild medial compartment joint space narrowing. Moderate knee joint effusion, nonspecific. Soft tissues appear within normal limits. IMPRESSION: 1. No acute osseous abnormality. 2. Moderate knee joint effusion, nonspecific. 3. Mild medial compartment joint space narrowing. Electronically Signed   By: Duanne Guess D.O.   On: 12/20/2019 19:35    Procedures Procedures (including critical care time)  Medications Ordered in UC Medications - No data to display  Initial Impression / Assessment and Plan / UC Course  I have reviewed the triage  vital signs and the nursing notes.  Pertinent labs & imaging results that were available during my care of the patient were reviewed by me and considered in my medical decision making (see chart for details).     Discussed imaging with pt Despite having posterior pain along with anterior and medial knee pain, doubt DVT as pt has had similar pain over the last few months, already had a venous US negative for DVT Pt provided brace for knee Recommend f/u with Sports Medicine for tx of knee effusion. AVS provided  Final Clinical Impressions(s) / UC Diagnoses   Final diagnoses:  Effusion of right knee joint  Chronic pain of right knee     Discharge Instructions      Meloxicam (Mobic) is an antiinflammatory to help with pain and inflammation.  Do not take ibuprofen, Advil, Aleve, or any other medications that contain NSAIDs while taking meloxicam as this may cause stomach upset or even ulcers if taken in large amounts for an extended period of time.   Try to keep your leg elevated and apply a cool compress 2-3 times daily for 10-20 minutes at a time to help with pain and swelling.  Call tomorrow to schedule a follow up appointment with Dr. Benjamin Stain (Dr. Karie Schwalbe), sports medicine for further evaluation and treatment. He may be able to drain the fluid off your knee if the swelling persists.  ED Prescriptions    Medication Sig Dispense Auth. Provider   meloxicam (MOBIC) 7.5 MG tablet Take 1 tablet (7.5 mg total) by mouth daily after breakfast. 7 tablet Lurene Shadow, PA-C     PDMP not reviewed this encounter.   Lurene Shadow, [place] 12/21/19 1248

## 2019-12-24 ENCOUNTER — Ambulatory Visit: Payer: Medicare PPO | Admitting: Family Medicine

## 2019-12-24 ENCOUNTER — Ambulatory Visit (INDEPENDENT_AMBULATORY_CARE_PROVIDER_SITE_OTHER): Payer: Medicare PPO | Admitting: Sports Medicine

## 2019-12-24 ENCOUNTER — Encounter: Payer: Self-pay | Admitting: Sports Medicine

## 2019-12-24 DIAGNOSIS — M1711 Unilateral primary osteoarthritis, right knee: Secondary | ICD-10-CM | POA: Diagnosis not present

## 2019-12-24 NOTE — Progress Notes (Addendum)
    Procedures performed today:    Procedure: Real-time Ultrasound Guided  aspiration/injection of right knee Device: Samsung HS60  Verbal informed consent obtained.  Time-out conducted.  Noted no overlying erythema, induration, or other signs of local infection.  Skin prepped in a sterile fashion.  Local anesthesia: Topical Ethyl chloride.  With sterile technique and under real time ultrasound guidance:  I advanced an 18-gauge needle into the suprapatellar recess, I aspirated 34 cc of clear, straw-colored fluid and syringe switched and 1 cc Kenalog 40, 2 cc lidocaine, 2 cc bupivacaine injected easily Completed without difficulty  Pain immediately resolved suggesting accurate placement of the medication.  Advised to call if fevers/chills, erythema, induration, drainage, or persistent bleeding.  Images permanently stored and available for review in the ultrasound unit.  Impression: Technically successful ultrasound guided injection.  Independent interpretation of notes and tests performed by another provider:   None.   Brief History, Exam, Impression, and Recommendations:    Primary osteoarthritis of right knee This is a very pleasant 74 year old female, she has a long history of right leg and calf pain. Initially suspected to be radicular in origin. She has had some therapy, NSAIDs, gabapentin without much improvement. More recently she developed swelling in her right knee, with worsening of symptoms. On exam she has an effusion, as well as posterior medial joint line pain with radiation into the calf, negative Homans' sign, infectious has also had a negative DVT ultrasound. I think her primary pain generator is her knee, we did an aspiration and injection, I put her Tru pull lite knee brace back on, return to see me in 1 month. She can continue meloxicam as needed    ___________________________________________ Ihor Austin. Benjamin Stain, M.D., ABFM., CAQSM. Primary Care and Sports  Medicine [facility]  Adjunct Instructor of Family Medicine  [employer]

## 2019-12-24 NOTE — Assessment & Plan Note (Addendum)
This is a very pleasant 74 year old female, she has a long history of right leg and calf pain. Initially suspected to be radicular in origin. She has had some therapy, NSAIDs, gabapentin without much improvement. More recently she developed swelling in her right knee, with worsening of symptoms. On exam she has an effusion, as well as posterior medial joint line pain with radiation into the calf, negative Homans' sign, infectious has also had a negative DVT ultrasound. I think her primary pain generator is her knee, we did an aspiration and injection, I put her Tru pull lite knee brace back on, return to see me in 1 month. She can continue meloxicam as needed

## 2019-12-25 ENCOUNTER — Ambulatory Visit: Payer: Medicare PPO | Admitting: Sports Medicine

## 2019-12-28 ENCOUNTER — Telehealth: Payer: Self-pay | Admitting: *Deleted

## 2019-12-28 NOTE — Telephone Encounter (Signed)
Patient request refill of Meloxicam 7.5 mg tablet

## 2019-12-29 ENCOUNTER — Other Ambulatory Visit: Payer: Self-pay | Admitting: Family Medicine

## 2019-12-31 ENCOUNTER — Other Ambulatory Visit: Payer: Self-pay | Admitting: Family Medicine

## 2019-12-31 MED ORDER — MELOXICAM 7.5 MG PO TABS: 7.5000 mg | ORAL_TABLET | Freq: Every day | ORAL | 0 refills | Status: DC

## 2019-12-31 NOTE — Telephone Encounter (Signed)
Noted  

## 2019-12-31 NOTE — Telephone Encounter (Signed)
Rx sent. Thanks, BJ 

## 2020-01-02 ENCOUNTER — Telehealth: Payer: Self-pay | Admitting: Sports Medicine

## 2020-01-02 ENCOUNTER — Telehealth: Payer: Self-pay | Admitting: Family Medicine

## 2020-01-02 DIAGNOSIS — M1711 Unilateral primary osteoarthritis, right knee: Secondary | ICD-10-CM

## 2020-01-02 NOTE — Telephone Encounter (Signed)
Patient needs an appointment tomorrow with you for continuing knee pain. She had a fall on 01/01/20.   Please advise  Mychart message:  Dr. Karie Schwalbe. My knee/leg felt better right after the procedure you did last week; but, the pain has not gone away, and It  is weak and unstable. Yesterday I fell and today it is hurting even more.

## 2020-01-02 NOTE — Telephone Encounter (Signed)
Patient called to make an appointment to see Dr. [name] from a fall on 01/01/2020. I scheduled the patient with Dr. [name] 06/18 because she only wanted to see Dr. [name] but then she was wondering if she can be seen today and the only openings are virtual with [facility]. I have talked with Harriett Sine to see if the patient can be scheduled for in office. Nacny asked for me to contact the patient to see if she is on blood thinners and find out what parts of her body did she injure when she fell.  I called the patient and there is no voicemail it just keeps ringing and I called the spouses number and it goes to voice mail but the voice mail is not set up to leave a message.  I the patient calls back we are just needing to know if she is on blood thinner and what parts of her body did she hurt when she fell.

## 2020-01-02 NOTE — Telephone Encounter (Signed)
Lets go ahead and get her scheduled for an MRI hopefully this weekend, I am going to order this.  Get her into see me at my next available open slot for in person visit.

## 2020-01-02 NOTE — Addendum Note (Signed)
Addended by: Monica Becton on: 01/02/2020 03:08 PM   Modules accepted: Orders

## 2020-01-03 ENCOUNTER — Other Ambulatory Visit: Payer: Self-pay

## 2020-01-04 ENCOUNTER — Encounter: Payer: Self-pay | Admitting: Family Medicine

## 2020-01-04 ENCOUNTER — Ambulatory Visit (INDEPENDENT_AMBULATORY_CARE_PROVIDER_SITE_OTHER): Payer: Medicare PPO | Admitting: Family Medicine

## 2020-01-04 VITALS — BP 118/80 | HR 60 | Temp 98.0°F | Resp 12 | Ht 60.0 in | Wt 119.0 lb

## 2020-01-04 DIAGNOSIS — M1711 Unilateral primary osteoarthritis, right knee: Secondary | ICD-10-CM | POA: Diagnosis not present

## 2020-01-04 DIAGNOSIS — Y92009 Unspecified place in unspecified non-institutional (private) residence as the place of occurrence of the external cause: Secondary | ICD-10-CM

## 2020-01-04 DIAGNOSIS — M79661 Pain in right lower leg: Secondary | ICD-10-CM | POA: Diagnosis not present

## 2020-01-04 DIAGNOSIS — E782 Mixed hyperlipidemia: Secondary | ICD-10-CM

## 2020-01-04 DIAGNOSIS — W19XXXA Unspecified fall, initial encounter: Secondary | ICD-10-CM

## 2020-01-04 NOTE — Patient Instructions (Signed)
A few things to remember from today's visit:  Sun screen and avoidance of direct sun exposure. Resume Crestor. Continue Meloxicam. Keep appt with sport medicine.   If you need refills please call your pharmacy. Do not use My Chart to request refills or for acute issues that need immediate attention.    Please be sure medication list is accurate. If a new problem present, please set up appointment sooner than planned today.

## 2020-01-04 NOTE — Progress Notes (Signed)
Chief Complaint  Patient presents with  . Fall    fell Tuesday moving trash can    HPI:  [name] is a 74 y.o. female, who is here today c/o having a fall on 01/01/20 at night. She was on her drive way pushing the trash can, she was holding a cane at the same time. She does not know how she tripped, fell and landed face down. Scratched right knee and right cheek. Negative for LOC. She stayed on the ground for a few minutes then she was able to get up. She did not seek medical attention.  She has had right knee pain for a few weeks now. Following with sport medicine. Meloxicam 7.5 mg was prescribed, it helps. Concerned about possible CV side effects.  2 weeks ago she had knee aspiration and steroid injection,which helped some. Medial knee soreness. Exacerbated by walking and alleviated by rest.   10/17/19 she was c/o new onset of calf pain that started 2 weeks before visit. No hx of trauma. 10/17/19 LE vascular US negative for DVT. Sport med evaluation, Dx'ed with radicular pain,completed oral prednisone and PT was recommended.  Stopped Crestor, she did not noted significant improvement. She has questions about Coenz Q10 supplementation. She is on Lovaza 1 g bid.  Lab Results  Component Value Date   CHOL 123 10/24/2019   HDL 31.70 (L) 10/24/2019   LDLCALC 61 10/24/2019   LDLDIRECT 152.0 06/25/2019   TRIG 151.0 (H) 10/24/2019   CHOLHDL 4 10/24/2019    She has an appt for knee MRI 01/06/20.  Negative for fever,chills,CP,palpitations,SOB,LE erythema,or skin rash.  Review of Systems  Constitutional: Negative for chills and fever.  Gastrointestinal: Negative for abdominal pain, nausea and vomiting.  Genitourinary: Negative for decreased urine volume and hematuria.  Musculoskeletal: Positive for arthralgias. Negative for joint swelling.  Skin: Negative for color change and pallor.  Neurological: Negative for syncope, facial asymmetry, weakness and numbness.    Psychiatric/Behavioral: Negative for confusion and sleep disturbance.  Rest see pertinent positives and negatives per HPI.  Current Outpatient Medications on File Prior to Visit  Medication Sig Dispense Refill  . Cholecalciferol (VITAMIN D3) 2000 units TABS Take 1 tablet by mouth daily.    . COLLAGEN PO Take by mouth daily.    . hydrochlorothiazide (HYDRODIURIL) 25 MG tablet Take 1 tablet (25 mg total) by mouth daily. 90 tablet 2  . lisinopril (ZESTRIL) 10 MG tablet TAKE TWO TABLETS BY MOUTH DAILY  180 tablet 0  . meloxicam (MOBIC) 7.5 MG tablet Take 1 tablet (7.5 mg total) by mouth daily after breakfast. 30 tablet 0  . metoprolol tartrate (LOPRESSOR) 25 MG tablet TAKE HALF TABLET BY MOUTH TWICE DAILY  90 tablet 2  . omega-3 acid ethyl esters (LOVAZA) 1 g capsule Take 1 capsule (1 g total) by mouth 2 (two) times daily. 180 capsule 2  . TURMERIC PO Take 1,500 mg by mouth in the morning and at bedtime.    Marland Kitchen albuterol (PROVENTIL HFA;VENTOLIN HFA) 108 (90 Base) MCG/ACT inhaler Inhale 2 puffs into the lungs every 6 (six) hours as needed for wheezing or shortness of breath. (Patient not taking: Reported on 01/04/2020) 1 Inhaler 0  . aspirin 81 MG tablet Take 81 mg by mouth daily. (Patient not taking: Reported on 01/04/2020)    . Cyanocobalamin (VITAMIN B-12) 5000 MCG TBDP Take 1 tablet by mouth daily. (Patient not taking: Reported on 01/04/2020)    . Multiple Minerals-Vitamins (CALCIUM CITRATE PLUS  PO) Take 2 tablets by mouth daily. (Patient not taking: Reported on 01/04/2020)    . Multiple Vitamin (MULTI-VITAMINS) TABS Take by mouth. (Patient not taking: Reported on 01/04/2020)    . Zinc 100 MG TABS Take 1 tablet by mouth daily.     No current facility-administered medications on file prior to visit.     Past Medical History:  Diagnosis Date  . Allergic rhinitis   . Arthralgia 09/30/2015  . Bronchitis, acute 01/09/2017  . Chronic shoulder pain 12/07/2016  . Epistaxis 09/30/2015  . Globus sensation  02/23/2015  . Heart murmur 12/07/2016  . HTN (hypertension)   . Left hip pain 09/30/2015  . Medicare annual wellness visit, subsequent 04/14/2015  . Pain in the wrist, unspecified laterality 03/03/2014  . Tachycardia    Episodic   Allergies  Allergen Reactions  . Fenofibrate Other (See Comments)    Abdominal pain  . Penicillins Rash    REACTION: Hives Anaphylaxis    Social History   Socioeconomic History  . Marital status: Married    Spouse name: Not on file  . Number of children: 1  . Years of education: Not on file  . Highest education level: Not on file  Occupational History  . Occupation: Dentist: RETIRED  Tobacco Use  . Smoking status: Never Smoker  . Smokeless tobacco: Never Used  Substance and Sexual Activity  . Alcohol use: Yes    Comment:  occ  . Drug use: No  . Sexual activity: Yes    Comment: lives with husband  Other Topics Concern  . Not on file  Social History Narrative   University - [place], Masters Degree - counseling, M[employer]'s [employer]   Married '69 - 7 years, divorced; married '82   21 daughter - '72; 2 grandchildren      Regular Exercise -  YES         Social Determinants of Health   Financial Resource Strain:   . Difficulty of Paying Living Expenses:   Food Insecurity:   . Worried About Programme researcher, broadcasting/film/video in the Last Year:   . Barista in the Last Year:   Transportation Needs:   . Freight forwarder (Medical):   Marland Kitchen Lack of Transportation (Non-Medical):   Physical Activity:   . Days of Exercise per Week:   . Minutes of Exercise per Session:   Stress:   . Feeling of Stress :   Social Connections:   . Frequency of Communication with Friends and Family:   . Frequency of Social Gatherings with Friends and Family:   . Attends Religious Services:   . Active Member of Clubs or Organizations:   . Attends Banker Meetings:   Marland Kitchen Marital Status:    Vitals:   01/04/20 1038  BP: 118/80  Pulse: 60    Resp: 12  Temp: 98 F (36.7 C)  SpO2: 95%   Body mass index is 23.24 kg/m.  Physical Exam  Nursing note and vitals reviewed. Constitutional: She is oriented to person, place, and time. She appears well-developed. No distress.  HENT:  Head: Normocephalic and atraumatic.  Eyes: Conjunctivae are normal.  Cardiovascular: Normal rate and regular rhythm.  No murmur heard. Pulses:      Dorsalis pedis pulses are 2+ on the right side and 2+ on the left side.  Respiratory: Effort normal and breath sounds normal. No respiratory distress.  GI: Normal appearance.  Musculoskeletal:  Legs:     Comments: Mild atrophy of muscles around right knee.  Neurological: She is alert and oriented to person, place, and time. No cranial nerve deficit.  Antalgic gait assisted with a cane.  Skin: Skin is warm. No rash noted. No erythema.     Superficial excoriations right cheek and anterior aspect of right knee.  Psychiatric:  Well groomed, good eye contact.   ASSESSMENT AND PLAN:  Ms. Lynasia was seen today for fall.  Diagnoses and all orders for this visit:  Fall in home, initial encounter Fall precautions discussed. She would benefit of PT for fall prevention.  Primary osteoarthritis of right knee We discussed Dx, prognosis,and treatment options. Having knee MRI this Sunday. Appt with sport medicine on 01/07/20. We discussed side effects of NSAID's. Continue Meloxicam 7.5 mg bid as needed.  Hyperlipidemia, mixed She can resume Crestor and monitor for worsening calf pain. Continue Lovaza 1 g bid. We discussed some side effects of statin meds,including DM.  Right calf pain Could be related to knee pain. Continue Meloxicam.   Return in about 3 months (around 04/05/2020) for HTN,HLD.   Ember Gottwald G. [name], MD  [facility]. [place] office. A few things to remember from today's visit:  Sun screen and avoidance of direct sun exposure. Resume Crestor. Continue  Meloxicam. Keep appt with sport medicine.   If you need refills please call your pharmacy. Do not use My Chart to request refills or for acute issues that need immediate attention.    Please be sure medication list is accurate. If a new problem present, please set up appointment sooner than planned today.

## 2020-01-05 ENCOUNTER — Other Ambulatory Visit: Payer: Self-pay | Admitting: Family Medicine

## 2020-01-06 ENCOUNTER — Ambulatory Visit (INDEPENDENT_AMBULATORY_CARE_PROVIDER_SITE_OTHER): Payer: Medicare PPO

## 2020-01-06 ENCOUNTER — Other Ambulatory Visit: Payer: Self-pay

## 2020-01-06 DIAGNOSIS — M1711 Unilateral primary osteoarthritis, right knee: Secondary | ICD-10-CM

## 2020-01-06 DIAGNOSIS — S83241A Other tear of medial meniscus, current injury, right knee, initial encounter: Secondary | ICD-10-CM | POA: Diagnosis not present

## 2020-01-07 ENCOUNTER — Encounter: Payer: Self-pay | Admitting: Sports Medicine

## 2020-01-07 ENCOUNTER — Ambulatory Visit (INDEPENDENT_AMBULATORY_CARE_PROVIDER_SITE_OTHER): Payer: Medicare PPO | Admitting: Sports Medicine

## 2020-01-07 DIAGNOSIS — M1711 Unilateral primary osteoarthritis, right knee: Secondary | ICD-10-CM | POA: Diagnosis not present

## 2020-01-07 MED ORDER — TRAMADOL HCL 50 MG PO TABS: 50.0000 mg | ORAL_TABLET | Freq: Three times a day (TID) | ORAL | 0 refills | Status: DC | PRN

## 2020-01-07 MED ORDER — MELOXICAM 15 MG PO TABS: ORAL_TABLET | ORAL | 3 refills | Status: DC

## 2020-01-07 NOTE — Assessment & Plan Note (Signed)
This is a very pleasant 74 year old female, we saw her approximately 2 weeks ago, right-sided medial knee pain. She did have an effusion, I aspirated and injected her knee, she had a few days of relief and then recurrence of pain. No mechanical symptoms, ultimately we obtained an MRI that shows maceration and extrusion of the medial meniscus with a small amount of osteoarthritis. She has some reactive edema in the tibial plateau, all of which are likely causing her discomfort. She has already had a good amount of physical therapy. Increasing meloxicam to 15 mg, adding tramadol for breakthrough pain. Because she is continuing to have pain in spite of conservative treatment I would like a surgical evaluation, I explained the limitations of arthroscopic meniscectomy in the setting of osteoarthritis, though I do think this is the appropriate course of action considering her current meniscal type pain. She would like to see Dr. 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

## 2020-01-07 NOTE — Progress Notes (Signed)
    Procedures performed today:    None.  Independent interpretation of notes and tests performed by another provider:   None.  Brief History, Exam, Impression, and Recommendations:    Primary osteoarthritis of right knee This is a very pleasant 74 year old female, we saw her approximately 2 weeks ago, right-sided medial knee pain. She did have an effusion, I aspirated and injected her knee, she had a few days of relief and then recurrence of pain. No mechanical symptoms, ultimately we obtained an MRI that shows maceration and extrusion of the medial meniscus with a small amount of osteoarthritis. She has some reactive edema in the tibial plateau, all of which are likely causing her discomfort. She has already had a good amount of physical therapy. Increasing meloxicam to 15 mg, adding tramadol for breakthrough pain. Because she is continuing to have pain in spite of conservative treatment I would like a surgical evaluation, I explained the limitations of arthroscopic meniscectomy in the setting of osteoarthritis, though I do think this is the appropriate course of action considering her current meniscal type pain. She would like to see Dr. 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    ___________________________________________ Ihor Austin. Benjamin Stain, M.D., ABFM., CAQSM. Primary Care and Sports Medicine [facility]  Adjunct Instructor of Family Medicine  [employer]

## 2020-01-22 ENCOUNTER — Ambulatory Visit: Payer: Medicare PPO | Admitting: Sports Medicine

## 2020-01-23 ENCOUNTER — Telehealth (INDEPENDENT_AMBULATORY_CARE_PROVIDER_SITE_OTHER): Payer: Medicare PPO | Admitting: Family Medicine

## 2020-01-23 ENCOUNTER — Encounter: Payer: Self-pay | Admitting: Family Medicine

## 2020-01-23 VITALS — BP 112/59 | HR 71 | Temp 98.5°F

## 2020-01-23 DIAGNOSIS — J069 Acute upper respiratory infection, unspecified: Secondary | ICD-10-CM

## 2020-01-23 DIAGNOSIS — R197 Diarrhea, unspecified: Secondary | ICD-10-CM

## 2020-01-23 MED ORDER — BENZONATATE 100 MG PO CAPS: 100.0000 mg | ORAL_CAPSULE | Freq: Two times a day (BID) | ORAL | 0 refills | Status: AC | PRN

## 2020-01-23 NOTE — Progress Notes (Signed)
Virtual Visit via Video Note I connected with Ms Kirtley on 01/23/20 by a video enabled telemedicine application and verified that I am speaking with the correct person using two identifiers.  Location patient: home Location provider:work office Persons participating in the virtual visit: patient, provider  I discussed the limitations of evaluation and management by telemedicine and the availability of in person appointments. The patient expressed understanding and agreed to proceed.  HPI: Ms Goza is a 74 yo female with hx of HTN,allergic rhinitis,and HLD with above concerns. Symptoms started 2 days ago. Rhinorrhea, no nasal congestion. + Post nasal drainage. Negative for anosmia or ageusia. + Fatigue and body aches.  Diarrhea x 1 days,soft stools, not blood or mucus. Today she had a normal bowel movements. Negative for abdominal pain,N/V,blood in stool,increased urine frequency,gross hematuria or dysuria. No skin rash.  2 days ago she had body aches and sore throat. Today she is feeling better. Productive cough with clear sputum, no associated dyspnea,wheezing , or CP.  No sick contact or recent travel.  Today she is feeling better. She took Tylenol 625 mg x 2 last night. COVID 19 vaccination completed.  ROS: See pertinent positives and negatives per HPI.  Past Medical History:  Diagnosis Date  . Allergic rhinitis   . Arthralgia 09/30/2015  . Bronchitis, acute 01/09/2017  . Chronic shoulder pain 12/07/2016  . Epistaxis 09/30/2015  . Globus sensation 02/23/2015  . Heart murmur 12/07/2016  . HTN (hypertension)   . Left hip pain 09/30/2015  . Medicare annual wellness visit, subsequent 04/14/2015  . Pain in the wrist, unspecified laterality 03/03/2014  . Tachycardia    Episodic    Past Surgical History:  Procedure Laterality Date  . APPENDECTOMY  74 yrs old  . BUNIONECTOMY    . WISDOM TOOTH EXTRACTION  74 yrs old    Family History  Problem Relation Age of Onset  . Cancer  Father        melanoma  . Diabetes Mother   . Hypertension Mother   . Hyperlipidemia Mother   . Stroke Mother   . Heart attack Mother   . Heart attack Brother   . Cancer Brother        pancreatic  . Colon cancer Neg Hx   . Breast cancer Neg Hx   . Coronary artery disease Neg Hx     Social History   Socioeconomic History  . Marital status: Married    Spouse name: Not on file  . Number of children: 1  . Years of education: Not on file  . Highest education level: Not on file  Occupational History  . Occupation: Dentist: RETIRED  Tobacco Use  . Smoking status: Never Smoker  . Smokeless tobacco: Never Used  Substance and Sexual Activity  . Alcohol use: Yes    Comment:  occ  . Drug use: No  . Sexual activity: Yes    Comment: lives with husband  Other Topics Concern  . Not on file  Social History Narrative   University - [place], Masters Degree - counseling, [employer]   Married '69 - 7 years, divorced; married '82   21 daughter - '72; 2 grandchildren      Regular Exercise -  YES         Social Determinants of Health   Financial Resource Strain:   . Difficulty of Paying Living Expenses:   Food Insecurity:   . Worried About Programme researcher, broadcasting/film/video in the  Last Year:   . Ran Out of Food in the Last Year:   Transportation Needs:   . Freight forwarder (Medical):   Marland Kitchen Lack of Transportation (Non-Medical):   Physical Activity:   . Days of Exercise per Week:   . Minutes of Exercise per Session:   Stress:   . Feeling of Stress :   Social Connections:   . Frequency of Communication with Friends and Family:   . Frequency of Social Gatherings with Friends and Family:   . Attends Religious Services:   . Active Member of Clubs or Organizations:   . Attends Banker Meetings:   Marland Kitchen Marital Status:   Intimate Partner Violence:   . Fear of Current or Ex-Partner:   . Emotionally Abused:   Marland Kitchen Physically Abused:   . Sexually Abused:      Current Outpatient Medications:  .  albuterol (PROVENTIL HFA;VENTOLIN HFA) 108 (90 Base) MCG/ACT inhaler, Inhale 2 puffs into the lungs every 6 (six) hours as needed for wheezing or shortness of breath., Disp: 1 Inhaler, Rfl: 0 .  aspirin 81 MG tablet, Take 81 mg by mouth daily. , Disp: , Rfl:  .  Cholecalciferol (VITAMIN D3) 2000 units TABS, Take 1 tablet by mouth daily., Disp: , Rfl:  .  COLLAGEN PO, Take by mouth daily., Disp: , Rfl:  .  Cyanocobalamin (VITAMIN B-12) 5000 MCG TBDP, Take 1 tablet by mouth daily. , Disp: , Rfl:  .  hydrochlorothiazide (HYDRODIURIL) 25 MG tablet, Take 1 tablet (25 mg total) by mouth daily., Disp: 90 tablet, Rfl: 2 .  lisinopril (ZESTRIL) 10 MG tablet, TAKE TWO TABLETS BY MOUTH DAILY , Disp: 180 tablet, Rfl: 0 .  meloxicam (MOBIC) 15 MG tablet, One tab PO qAM with a meal for 2 weeks, then daily prn pain., Disp: 30 tablet, Rfl: 3 .  metoprolol tartrate (LOPRESSOR) 25 MG tablet, TAKE HALF TABLET BY MOUTH TWICE DAILY , Disp: 90 tablet, Rfl: 2 .  Multiple Minerals-Vitamins (CALCIUM CITRATE PLUS PO), Take 2 tablets by mouth daily. , Disp: , Rfl:  .  Multiple Vitamin (MULTI-VITAMINS) TABS, Take by mouth. , Disp: , Rfl:  .  omega-3 acid ethyl esters (LOVAZA) 1 g capsule, Take 1 capsule (1 g total) by mouth 2 (two) times daily., Disp: 180 capsule, Rfl: 2 .  rosuvastatin (CRESTOR) 10 MG tablet, TAKE ONE TABLET BY MOUTH ONE TIME DAILY, Disp: 90 tablet, Rfl: 0 .  rosuvastatin (CRESTOR) 10 MG tablet, Take 10 mg by mouth daily., Disp: , Rfl:  .  traMADol (ULTRAM) 50 MG tablet, Take 1-2 tablets (50-100 mg total) by mouth every 8 (eight) hours as needed for moderate pain. Maximum 6 tabs per day., Disp: 21 tablet, Rfl: 0 .  TURMERIC PO, Take 1,500 mg by mouth in the morning and at bedtime., Disp: , Rfl:  .  Zinc 100 MG TABS, Take 1 tablet by mouth daily., Disp: , Rfl:  .  benzonatate (TESSALON) 100 MG capsule, Take 1-2 capsules (100-200 mg total) by mouth 2 (two) times daily  as needed for up to 10 days., Disp: 30 capsule, Rfl: 0  EXAM:  VITALS per patient if applicable:BP (!) 112/59   Pulse 71   Temp 98.5 F (36.9 C)   SpO2 98%   GENERAL: alert, oriented, appears well and in no acute distress  HEENT: atraumatic, conjunctiva clear, no obvious abnormalities on inspection of external nose and ears  NECK: normal movements of the head and neck  LUNGS: on  inspection no signs of respiratory distress, breathing rate appears normal, no obvious gross SOB, gasping or wheezing  CV: no obvious cyanosis  MS: moves all visible extremities without noticeable abnormality  PSYCH/NEURO: pleasant and cooperative, no obvious depression or anxiety, speech and thought processing grossly intact  ASSESSMENT AND PLAN:  Discussed the following assessment and plan:  URI, acute Most likely viral. Symptomatic treatment recommended. Since she is feeling better + COVID 19 vaccination completed; so for now I do not think she needs to have screening done. Continue monitoring temp and if 100 F or more + worsening symptoms,she will call the office. Benzonatate 100 mg 1-2 cap bid as needed for cough. Instructed about warning signs.   Diarrhea, unspecified type Improved. Adequate hydration, gatorade or Pedialyte + water 1:1 small and frequent sips through the day. Instructed about warning signs.  I discussed the assessment and treatment plan with the patient. Ms Kos was provided an opportunity to ask questions and all were answered. She agreed with the plan and demonstrated an understanding of the instructions.   Return if symptoms worsen or fail to improve.    Cody Oliger [name], MD

## 2020-01-28 DIAGNOSIS — M1711 Unilateral primary osteoarthritis, right knee: Secondary | ICD-10-CM | POA: Diagnosis not present

## 2020-02-05 ENCOUNTER — Other Ambulatory Visit: Payer: Self-pay

## 2020-02-05 ENCOUNTER — Ambulatory Visit: Payer: Medicare PPO

## 2020-02-06 DIAGNOSIS — M25561 Pain in right knee: Secondary | ICD-10-CM | POA: Diagnosis not present

## 2020-02-13 ENCOUNTER — Ambulatory Visit (INDEPENDENT_AMBULATORY_CARE_PROVIDER_SITE_OTHER): Payer: Medicare PPO

## 2020-02-13 ENCOUNTER — Other Ambulatory Visit: Payer: Self-pay

## 2020-02-13 DIAGNOSIS — Z0000 Encounter for general adult medical examination without abnormal findings: Secondary | ICD-10-CM

## 2020-02-13 DIAGNOSIS — Z1231 Encounter for screening mammogram for malignant neoplasm of breast: Secondary | ICD-10-CM

## 2020-02-13 DIAGNOSIS — Z780 Asymptomatic menopausal state: Secondary | ICD-10-CM

## 2020-02-13 NOTE — Progress Notes (Signed)
Subjective:   Eileen Hansen is a 74 y.o. female who presents for Medicare Annual (Subsequent) preventive examination.   I connected with Eileen Hansen today by telephone and verified that I am speaking with the correct person using two identifiers. Location patient: home Location provider: work Persons participating in the virtual visit: patient, provider.   I discussed the limitations, risks, security and privacy concerns of performing an evaluation and management service by telephone and the availability of in person appointments. I also discussed with the patient that there may be a patient responsible charge related to this service. The patient expressed understanding and verbally consented to this telephonic visit.    Interactive audio and video telecommunications were attempted between this provider and patient, however failed, due to patient having technical difficulties OR patient did not have access to video capability.  We continued and completed visit with audio only.     Review of Systems    N/A Cardiac Risk Factors include: advanced age (>10men, >43 women)     Objective:    Today's Vitals   02/13/20 1356  PainSc: 6    There is no height or weight on file to calculate BMI.  Advanced Directives 02/13/2020 11/07/2019 10/17/2019 10/16/2019 09/02/2016 04/14/2015 10/17/2014  Does Patient Have a Medical Advance Directive? Yes No No No Yes No No  Type of Estate agent of [place];Living will - - - Public affairs consultant of [place];Living will - -  Does patient want to make changes to medical advance directive? No - Patient declined - - - - Yes - information given -  Copy of Healthcare Power of Attorney in Chart? No - copy requested - - - No - copy requested - -  Would patient like information on creating a medical advance directive? - No - Patient declined - - - Yes - Educational materials given No - patient declined information    Current Medications  (verified) Outpatient Encounter Medications as of 02/13/2020  Medication Sig  . Cholecalciferol (VITAMIN D3) 2000 units TABS Take 1 tablet by mouth daily.  . COLLAGEN PO Take by mouth daily.  . hydrochlorothiazide (HYDRODIURIL) 25 MG tablet Take 1 tablet (25 mg total) by mouth daily.  Marland Kitchen lisinopril (ZESTRIL) 10 MG tablet TAKE TWO TABLETS BY MOUTH DAILY   . meloxicam (MOBIC) 15 MG tablet One tab PO qAM with a meal for 2 weeks, then daily prn pain.  . metoprolol tartrate (LOPRESSOR) 25 MG tablet TAKE HALF TABLET BY MOUTH TWICE DAILY   . omega-3 acid ethyl esters (LOVAZA) 1 g capsule Take 1 capsule (1 g total) by mouth 2 (two) times daily.  . rosuvastatin (CRESTOR) 10 MG tablet TAKE ONE TABLET BY MOUTH ONE TIME DAILY  . albuterol (PROVENTIL HFA;VENTOLIN HFA) 108 (90 Base) MCG/ACT inhaler Inhale 2 puffs into the lungs every 6 (six) hours as needed for wheezing or shortness of breath. (Patient not taking: Reported on 02/13/2020)  . aspirin 81 MG tablet Take 81 mg by mouth daily.  (Patient not taking: Reported on 02/13/2020)  . Cyanocobalamin (VITAMIN B-12) 5000 MCG TBDP Take 1 tablet by mouth daily.  (Patient not taking: Reported on 02/13/2020)  . Multiple Minerals-Vitamins (CALCIUM CITRATE PLUS PO) Take 2 tablets by mouth daily.  (Patient not taking: Reported on 02/13/2020)  . Multiple Vitamin (MULTI-VITAMINS) TABS Take by mouth.  (Patient not taking: Reported on 02/13/2020)  . traMADol (ULTRAM) 50 MG tablet Take 1-2 tablets (50-100 mg total) by mouth every 8 (eight) hours as needed  for moderate pain. Maximum 6 tabs per day. (Patient not taking: Reported on 02/13/2020)  . TURMERIC PO Take 1,500 mg by mouth in the morning and at bedtime. (Patient not taking: Reported on 02/13/2020)  . Zinc 100 MG TABS Take 1 tablet by mouth daily. (Patient not taking: Reported on 02/13/2020)  . [DISCONTINUED] rosuvastatin (CRESTOR) 10 MG tablet Take 10 mg by mouth daily.   No facility-administered encounter medications on file  as of 02/13/2020.    Allergies (verified) Fenofibrate and Penicillins   History: Past Medical History:  Diagnosis Date  . Allergic rhinitis   . Arthralgia 09/30/2015  . Bronchitis, acute 01/09/2017  . Chronic shoulder pain 12/07/2016  . Epistaxis 09/30/2015  . Globus sensation 02/23/2015  . Heart murmur 12/07/2016  . HTN (hypertension)   . Left hip pain 09/30/2015  . Medicare annual wellness visit, subsequent 04/14/2015  . Pain in the wrist, unspecified laterality 03/03/2014  . Tachycardia    Episodic   Past Surgical History:  Procedure Laterality Date  . APPENDECTOMY  74 yrs old  . BUNIONECTOMY    . WISDOM TOOTH EXTRACTION  74 yrs old   Family History  Problem Relation Age of Onset  . Cancer Father        melanoma  . Diabetes Mother   . Hypertension Mother   . Hyperlipidemia Mother   . Stroke Mother   . Heart attack Mother   . Heart attack Brother   . Cancer Brother        pancreatic  . Colon cancer Neg Hx   . Breast cancer Neg Hx   . Coronary artery disease Neg Hx    Social History   Socioeconomic History  . Marital status: Married    Spouse name: Not on file  . Number of children: 1  . Years of education: Not on file  . Highest education level: Not on file  Occupational History  . Occupation: Dentist: RETIRED  Tobacco Use  . Smoking status: Never Smoker  . Smokeless tobacco: Never Used  Substance and Sexual Activity  . Alcohol use: Yes    Comment:  occ  . Drug use: No  . Sexual activity: Yes    Comment: lives with husband  Other Topics Concern  . Not on file  Social History Narrative   University - [place], Masters Degree - counseling, M[employer]'s [employer]   Married '69 - 7 years, divorced; married '82   21 daughter - '72; 2 grandchildren      Regular Exercise -  YES         Social Determinants of Health   Financial Resource Strain: Low Risk   . Difficulty of Paying Living Expenses: Not hard at all  Food Insecurity: No Food  Insecurity  . Worried About Programme researcher, broadcasting/film/video in the Last Year: Never true  . Ran Out of Food in the Last Year: Never true  Transportation Needs: No Transportation Needs  . Lack of Transportation (Medical): No  . Lack of Transportation (Non-Medical): No  Physical Activity: Inactive  . Days of Exercise per Week: 0 days  . Minutes of Exercise per Session: 0 min  Stress: No Stress Concern Present  . Feeling of Stress : Not at all  Social Connections: Moderately Isolated  . Frequency of Communication with Friends and Family: More than three times a week  . Frequency of Social Gatherings with Friends and Family: Twice a week  . Attends Religious Services: Never  .  Active Member of Clubs or Organizations: No  . Attends Banker Meetings: Never  . Marital Status: Married    Tobacco Counseling Counseling given: Not Answered   Clinical Intake:  Pre-visit preparation completed: Yes  Pain : 0-10 Pain Score: 6  Pain Type: Chronic pain Pain Location: Knee Pain Orientation: Right Pain Descriptors / Indicators: Dull, Sharp Pain Onset: More than a month ago Pain Frequency: Constant Pain Relieving Factors: ice, ibuprofen, meloxicam  Pain Relieving Factors: ice, ibuprofen, meloxicam  Nutritional Risks: None Diabetes: No  How often do you need to have someone help you when you read instructions, pamphlets, or other written materials from your doctor or pharmacy?: 1 - Never What is the last grade level you completed in school?: 2 Masters Degrees  Diabetic?No  Interpreter Needed?: No  Information entered by :: SCrews,LPN   Activities of Daily Living In your present state of health, do you have any difficulty performing the following activities: 02/13/2020 01/23/2020  Hearing? N N  Vision? N N  Difficulty concentrating or making decisions? Y N  Comment Some short term memory issues -  Walking or climbing stairs? Y N  Comment due to knee pain -  Dressing or bathing? N  N  Doing errands, shopping? N N  Preparing Food and eating ? N -  Using the Toilet? N -  In the past six months, have you accidently leaked urine? N -  Do you have problems with loss of bowel control? N -  Managing your Medications? N -  Managing your Finances? N -  Housekeeping or managing your Housekeeping? N -  Some recent data might be hidden    Patient Care Team: [name], [name], MD as PCP - General (Family Medicine) Carrington Clamp, MD as Consulting Physician (Obstetrics and Gynecology) Rachael Fee, MD as Attending Physician (Gastroenterology) Carman Ching, OD (Optometry)  Indicate any recent Medical Services you may have received from other than [facility] providers in the past year (date may be approximate).     Assessment:   This is a routine wellness examination for [place].  Hearing/Vision screen  Hearing Screening   125Hz  250Hz  500Hz  1000Hz  2000Hz  3000Hz  4000Hz  6000Hz  8000Hz   Right ear:           Left ear:           Vision Screening Comments: Patient gets her eyes checked yearly  Dietary issues and exercise activities discussed: Current Exercise Habits: The patient does not participate in regular exercise at present, Exercise limited by: orthopedic condition(s)  Goals    . Patient Stated     I would like to be without knee pain     . Weight (lb) < 115 lb (52.2 kg)     Patient would like to lose 5 lbs     . Would like to travel to .      Depression Screen PHQ 2/9 Scores 02/13/2020 01/08/2019 02/03/2018 09/02/2016 04/14/2015 04/05/2014  PHQ - 2 Score 0 0 0 0 0 0  PHQ- 9 Score 0 - - - - -    Fall Risk Fall Risk  02/13/2020 01/08/2019 02/03/2018 09/02/2016 04/14/2015  Falls in the past year? 1 0 No No Yes  Number falls in past yr: 0 0 - - 1  Injury with Fall? 1 0 - - Yes  Comment Abrasion to knee - - - -  Risk for fall due to : Orthopedic patient - - - -  Follow up Falls evaluation completed;Falls prevention  discussed Education provided - - -    Any  stairs in or around the home? No  If so, are there any without handrails? No  Home free of loose throw rugs in walkways, pet beds, electrical cords, etc? Yes  Adequate lighting in your home to reduce risk of falls? Yes   ASSISTIVE DEVICES UTILIZED TO PREVENT FALLS:  Life alert? No  Use of a cane, walker or w/c? Yes  cane, walker we needed due to knee pain Grab bars in the bathroom? No  Shower chair or bench in shower? No  Elevated toilet seat or a handicapped toilet? Yes     Cognitive Function: MMSE - Mini Mental State Exam 09/02/2016  Orientation to time 5  Orientation to Place 5  Registration 3  Attention/ Calculation 5  Recall 3  Language- name 2 objects 2  Language- repeat 1  Language- follow 3 step command 3  Language- read & follow direction 1  Write a sentence 1  Copy design 1  Total score 30     6CIT Screen 02/13/2020  What Year? 0 points  What month? 0 points  What time? 0 points  Count back from 20 0 points  Months in reverse 0 points  Repeat phrase 0 points  Total Score 0    Immunizations Immunization History  Administered Date(s) Administered  . Fluad Quad(high Dose 65+) 05/07/2019  . Influenza Split 05/11/2012  . Influenza Whole 05/31/2007, 05/14/2008, 05/29/2010  . Influenza, High Dose Seasonal PF 04/08/2016, 04/11/2017  . Influenza,inj,Quad PF,6+ Mos 04/19/2013, 04/05/2014, 04/14/2015  . Influenza-Unspecified 05/19/2018  . PFIZER SARS-COV-2 Vaccination 08/07/2019, 08/27/2019  . Pneumococcal Conjugate-13 02/26/2014  . Pneumococcal Polysaccharide-23 03/29/2011  . Tdap 03/29/2011    TDAP status: Up to date Flu Vaccine status: Up to date Pneumococcal vaccine status: Up to date Covid-19 vaccine status: Completed vaccines  Qualifies for Shingles Vaccine? Yes   Zostavax completed No   Shingrix Completed?: No.    Education has been provided regarding the importance of this vaccine. Patient has been advised to call insurance company to determine out  of pocket expense if they have not yet received this vaccine. Advised may also receive vaccine at local pharmacy or Health Dept. Verbalized acceptance and understanding.  Screening Tests Health Maintenance  Topic Date Due  . INFLUENZA VACCINE  02/17/2020  . TETANUS/TDAP  03/28/2021  . DEXA SCAN  Completed  . COVID-19 Vaccine  Completed  . Hepatitis C Screening  Completed  . PNA vac Low Risk Adult  Completed    Health Maintenance  There are no preventive care reminders to display for this patient.  Colorectal cancer screening: Completed 05/28/2017. Repeat every 10 years Mammogram status: Ordered 02/13/2020. Pt provided with contact info and advised to call to schedule appt.  Bone Density status: Ordered 02/13/2020. Pt provided with contact info and advised to call to schedule appt.  Lung Cancer Screening: (Low Dose CT Chest recommended if Age 39-80 years, 30 pack-year currently smoking OR have quit w/in 15years.) does not qualify.   Lung Cancer Screening Referral: N/A  Additional Screening:  Hepatitis C Screening: does qualify; Completed 09/23/2015  Vision Screening: Recommended annual ophthalmology exams for early detection of glaucoma and other disorders of the eye. Is the patient up to date with their annual eye exam?  Yes  Who is the provider or what is the name of the office in which the patient attends annual eye exams? Dr. Hyacinth MeekerMiller If pt is not established with a provider, would they  like to be referred to a provider to establish care? No .   Dental Screening: Recommended annual dental exams for proper oral hygiene  Community Resource Referral / Chronic Care Management: CRR required this visit?  No   CCM required this visit?  No      Plan:     I have personally reviewed and noted the following in the patient's chart:   . Medical and social history . Use of alcohol, tobacco or illicit drugs  . Current medications and supplements . Functional ability and  status . Nutritional status . Physical activity . Advanced directives . List of other physicians . Hospitalizations, surgeries, and ER visits in previous 12 months . Vitals . Screenings to include cognitive, depression, and falls . Referrals and appointments  In addition, I have reviewed and discussed with patient certain preventive protocols, quality metrics, and best practice recommendations. A written personalized care plan for preventive services as well as general preventive health recommendations were provided to patient.     Theodora Blow, LPN   [id number]/[id number]   Nurse Notes: None

## 2020-02-13 NOTE — Patient Instructions (Signed)
Ms. Eileen Hansen , Thank you for taking time to come for your Medicare Wellness Visit. I appreciate your ongoing commitment to your health goals. Please review the following plan we discussed and let me know if I can assist you in the future.   Screening recommendations/referrals: Colonoscopy: Up to date, next due 05/27/2023 Mammogram: Currently due, orders placed this visit Bone Density: Currently Due, orders placed this visit Recommended yearly ophthalmology/optometry visit for glaucoma screening and checkup Recommended yearly dental visit for hygiene and checkup  Vaccinations: Influenza vaccine: Up to date, next due 02/2020 Pneumococcal vaccine: Completed series Tdap vaccine: Up to date, next due 03/29/2011 Shingles vaccine: Currently due, please check with your pharmacy to obtain cost and to receive     Advanced directives: Please bring a copy to your next office visit  Conditions/risks identified: None   Next appointment: 03/26/2020 @ 12:00 PM with Dr. [name]   Preventive Care 65 Years and Older, Female Preventive care refers to lifestyle choices and visits with your health care provider that can promote health and wellness. What does preventive care include?  A yearly physical exam. This is also called an annual well check.  Dental exams once or twice a year.  Routine eye exams. Ask your health care provider how often you should have your eyes checked.  Personal lifestyle choices, including:  Daily care of your teeth and gums.  Regular physical activity.  Eating a healthy diet.  Avoiding tobacco and drug use.  Limiting alcohol use.  Practicing safe sex.  Taking low-dose aspirin every day.  Taking vitamin and mineral supplements as recommended by your health care provider. What happens during an annual well check? The services and screenings done by your health care provider during your annual well check will depend on your age, overall health, lifestyle risk factors,  and family history of disease. Counseling  Your health care provider may ask you questions about your:  Alcohol use.  Tobacco use.  Drug use.  Emotional well-being.  Home and relationship well-being.  Sexual activity.  Eating habits.  History of falls.  Memory and ability to understand (cognition).  Work and work Astronomer.  Reproductive health. Screening  You may have the following tests or measurements:  Height, weight, and BMI.  Blood pressure.  Lipid and cholesterol levels. These may be checked every 5 years, or more frequently if you are over 90 years old.  Skin check.  Lung cancer screening. You may have this screening every year starting at age 40 if you have a 30-pack-year history of smoking and currently smoke or have quit within the past 15 years.  Fecal occult blood test (FOBT) of the stool. You may have this test every year starting at age 59.  Flexible sigmoidoscopy or colonoscopy. You may have a sigmoidoscopy every 5 years or a colonoscopy every 10 years starting at age 6.  Hepatitis C blood test.  Hepatitis B blood test.  Sexually transmitted disease (STD) testing.  Diabetes screening. This is done by checking your blood sugar (glucose) after you have not eaten for a while (fasting). You may have this done every 1-3 years.  Bone density scan. This is done to screen for osteoporosis. You may have this done starting at age 76.  Mammogram. This may be done every 1-2 years. Talk to your health care provider about how often you should have regular mammograms. Talk with your health care provider about your test results, treatment options, and if necessary, the need for more tests. Vaccines  Your health care provider may recommend certain vaccines, such as:  Influenza vaccine. This is recommended every year.  Tetanus, diphtheria, and acellular pertussis (Tdap, Td) vaccine. You may need a Td booster every 10 years.  Zoster vaccine. You may need  this after age 54.  Pneumococcal 13-valent conjugate (PCV13) vaccine. One dose is recommended after age 47.  Pneumococcal polysaccharide (PPSV23) vaccine. One dose is recommended after age 64. Talk to your health care provider about which screenings and vaccines you need and how often you need them. This information is not intended to replace advice given to you by your health care provider. Make sure you discuss any questions you have with your health care provider. Document Released: 08/01/2015 Document Revised: 03/24/2016 Document Reviewed: 05/06/2015 Elsevier Interactive Patient Education  2017 [employer].  Fall Prevention in the Home Falls can cause injuries. They can happen to people of all ages. There are many things you can do to make your home safe and to help prevent falls. What can I do on the outside of my home?  Regularly fix the edges of walkways and driveways and fix any cracks.  Remove anything that might make you trip as you walk through a door, such as a raised step or threshold.  Trim any bushes or trees on the path to your home.  Use bright outdoor lighting.  Clear any walking paths of anything that might make someone trip, such as rocks or tools.  Regularly check to see if handrails are loose or broken. Make sure that both sides of any steps have handrails.  Any raised decks and porches should have guardrails on the edges.  Have any leaves, snow, or ice cleared regularly.  Use sand or salt on walking paths during winter.  Clean up any spills in your garage right away. This includes oil or grease spills. What can I do in the bathroom?  Use night lights.  Install grab bars by the toilet and in the tub and shower. Do not use towel bars as grab bars.  Use non-skid mats or decals in the tub or shower.  If you need to sit down in the shower, use a plastic, non-slip stool.  Keep the floor dry. Clean up any water that spills on the floor as soon as it  happens.  Remove soap buildup in the tub or shower regularly.  Attach bath mats securely with double-sided non-slip rug tape.  Do not have throw rugs and other things on the floor that can make you trip. What can I do in the bedroom?  Use night lights.  Make sure that you have a light by your bed that is easy to reach.  Do not use any sheets or blankets that are too big for your bed. They should not hang down onto the floor.  Have a firm chair that has side arms. You can use this for support while you get dressed.  Do not have throw rugs and other things on the floor that can make you trip. What can I do in the kitchen?  Clean up any spills right away.  Avoid walking on wet floors.  Keep items that you use a lot in easy-to-reach places.  If you need to reach something above you, use a strong step stool that has a grab bar.  Keep electrical cords out of the way.  Do not use floor polish or wax that makes floors slippery. If you must use wax, use non-skid floor wax.  Do  not have throw rugs and other things on the floor that can make you trip. What can I do with my stairs?  Do not leave any items on the stairs.  Make sure that there are handrails on both sides of the stairs and use them. Fix handrails that are broken or loose. Make sure that handrails are as long as the stairways.  Check any carpeting to make sure that it is firmly attached to the stairs. Fix any carpet that is loose or worn.  Avoid having throw rugs at the top or bottom of the stairs. If you do have throw rugs, attach them to the floor with carpet tape.  Make sure that you have a light switch at the top of the stairs and the bottom of the stairs. If you do not have them, ask someone to add them for you. What else can I do to help prevent falls?  Wear shoes that:  Do not have high heels.  Have rubber bottoms.  Are comfortable and fit you well.  Are closed at the toe. Do not wear sandals.  If you  use a stepladder:  Make sure that it is fully opened. Do not climb a closed stepladder.  Make sure that both sides of the stepladder are locked into place.  Ask someone to hold it for you, if possible.  Clearly mark and make sure that you can see:  Any grab bars or handrails.  First and last steps.  Where the edge of each step is.  Use tools that help you move around (mobility aids) if they are needed. These include:  Canes.  Walkers.  Scooters.  Crutches.  Turn on the lights when you go into a dark area. Replace any light bulbs as soon as they burn out.  Set up your furniture so you have a clear path. Avoid moving your furniture around.  If any of your floors are uneven, fix them.  If there are any pets around you, be aware of where they are.  Review your medicines with your doctor. Some medicines can make you feel dizzy. This can increase your chance of falling. Ask your doctor what other things that you can do to help prevent falls. This information is not intended to replace advice given to you by your health care provider. Make sure you discuss any questions you have with your health care provider. Document Released: 05/01/2009 Document Revised: 12/11/2015 Document Reviewed: 08/09/2014 Elsevier Interactive Patient Education  2017 [employer].

## 2020-02-15 DIAGNOSIS — M1711 Unilateral primary osteoarthritis, right knee: Secondary | ICD-10-CM | POA: Diagnosis not present

## 2020-02-20 DIAGNOSIS — M1711 Unilateral primary osteoarthritis, right knee: Secondary | ICD-10-CM | POA: Diagnosis not present

## 2020-02-25 ENCOUNTER — Other Ambulatory Visit: Payer: Self-pay | Admitting: Family Medicine

## 2020-02-25 DIAGNOSIS — E2839 Other primary ovarian failure: Secondary | ICD-10-CM

## 2020-02-25 DIAGNOSIS — M1711 Unilateral primary osteoarthritis, right knee: Secondary | ICD-10-CM | POA: Diagnosis not present

## 2020-03-03 ENCOUNTER — Other Ambulatory Visit: Payer: Self-pay | Admitting: Family Medicine

## 2020-03-03 DIAGNOSIS — I10 Essential (primary) hypertension: Secondary | ICD-10-CM

## 2020-03-03 DIAGNOSIS — M1711 Unilateral primary osteoarthritis, right knee: Secondary | ICD-10-CM | POA: Diagnosis not present

## 2020-03-10 DIAGNOSIS — M1711 Unilateral primary osteoarthritis, right knee: Secondary | ICD-10-CM | POA: Diagnosis not present

## 2020-03-26 ENCOUNTER — Encounter: Payer: Self-pay | Admitting: Family Medicine

## 2020-03-26 ENCOUNTER — Ambulatory Visit: Payer: Medicare PPO | Admitting: Family Medicine

## 2020-03-26 ENCOUNTER — Other Ambulatory Visit: Payer: Self-pay

## 2020-03-26 VITALS — BP 110/70 | HR 90 | Resp 16 | Ht 60.0 in | Wt 121.2 lb

## 2020-03-26 DIAGNOSIS — E782 Mixed hyperlipidemia: Secondary | ICD-10-CM

## 2020-03-26 DIAGNOSIS — I10 Essential (primary) hypertension: Secondary | ICD-10-CM

## 2020-03-26 DIAGNOSIS — R011 Cardiac murmur, unspecified: Secondary | ICD-10-CM

## 2020-03-26 DIAGNOSIS — R7303 Prediabetes: Secondary | ICD-10-CM | POA: Diagnosis not present

## 2020-03-26 DIAGNOSIS — M1711 Unilateral primary osteoarthritis, right knee: Secondary | ICD-10-CM | POA: Diagnosis not present

## 2020-03-26 NOTE — Patient Instructions (Signed)
A few things to remember from today's visit:  Hyperlipidemia, mixed - Plan: Lipid panel  Prediabetes - Plan: Hemoglobin A1c  Essential hypertension - Plan: BASIC METABOLIC PANEL WITH GFR  Heart murmur - Plan: Ambulatory referral to Cardiology  No changes today.  If you need refills please call your pharmacy. Do not use My Chart to request refills or for acute issues that need immediate attention.    Please be sure medication list is accurate. If a new problem present, please set up appointment sooner than planned today.

## 2020-03-26 NOTE — Progress Notes (Signed)
HPI: Eileen Hansen is a 74 y.o. female, who is here today for 5 months follow up.   She was last seen on 01/23/2020, virtual visit due to acute respiratory symptoms. Since her last visit she has been following with orthopedist, is still having right knee pain. Recently completed Pecolia AdesGallerani has failed injections, so far she has not noted major improvement.  She is supposed to wait about 6 weeks and follow-up with orthopedist to discuss options.  She is on meloxicam 15 mg daily as needed. Knee pain is exacerbated by prolonged standing, walking, and certain movements when she gets in bed. + Effusion, no erythema. Pain is alleviated by rest.  HTN: Dx 13+ years ago. She is currently on metoprolol tartrate 25 mg twice daily, lisinopril 10 mg daily, and HCTZ 25 mg daily. She wonders if she can stop some medications, she thinks she is taking so many. Negative for severe/frequent headache, visual changes, chest pain, dyspnea, palpitation, focal weakness, or edema.  She is not monitoring BP at home.  Lab Results  Component Value Date   CREATININE 0.65 10/16/2019   BUN 25 (H) 10/16/2019   NA 143 10/16/2019   K 4.1 10/16/2019   CL 106 10/16/2019   CO2 31 10/16/2019   A few months ago during ER visit she was recommended cardiologist evaluation because heart murmur.This problem is on her problem list, I have not appreciated heart murmur during recent visits. Echocardiogram was done on 12/15/2016: - Left ventricle: The cavity size was normal. Wall thickness was increased in a pattern of mild LVH. Systolic function was normal.  The estimated ejection fraction was in the range of 60% to 65%.  Wall motion was normal; there were no regional wall motion abnormalities.  - Aortic valve: There was mild regurgitation.  - Mitral valve: There was mild regurgitation.  - Left atrium: The atrium was mildly dilated.  - Atrial septum: No defect or patent foramen ovale was identified.   Carotid US in  10/2014: Minimal plaque in the carotid arteries.  No significant carotid artery stenosis. Patent vertebral arteries. Prediabetes: Negative for polydipsia,polyuria, or polyphagia.  Lab Results  Component Value Date   HGBA1C 6.1 10/24/2019   Hyperlipidemia: She is on Crestor 10 mg, she has muscle aches/stabbing on different areas of her back. She thinks it may be related to statin.  Her pharmacist suggested Pravastatin and Q10 supplementation.  She is also on Lovaza 1 g twice daily. She also follows low-fat diet.  Lab Results  Component Value Date   CHOL 123 10/24/2019   HDL 31.70 (L) 10/24/2019   LDLCALC 61 10/24/2019   LDLDIRECT 152.0 06/25/2019   TRIG 151.0 (H) 10/24/2019   CHOLHDL 4 10/24/2019   Review of Systems  Constitutional: Negative for activity change, appetite change, fatigue and fever.  HENT: Negative for mouth sores, nosebleeds and sore throat.   Eyes: Negative for redness and visual disturbance.  Respiratory: Negative for cough and wheezing.   Gastrointestinal: Negative for abdominal pain, nausea and vomiting.       Negative for changes in bowel habits.  Endocrine: Negative for cold intolerance and heat intolerance.  Genitourinary: Negative for decreased urine volume, dysuria and hematuria.  Musculoskeletal: Positive for arthralgias.  Skin: Negative for pallor and rash.  Neurological: Negative for syncope and facial asymmetry.  Rest of ROS, see pertinent positives sand negatives in HPI  Current Outpatient Medications on File Prior to Visit  Medication Sig Dispense Refill  . calcium citrate-vitamin D (CITRACAL+D)  315-200 MG-UNIT tablet Take 1 tablet by mouth 2 (two) times daily.    . Cholecalciferol (VITAMIN D3) 2000 units TABS Take 1 tablet by mouth daily.    . COLLAGEN PO Take by mouth daily.    . Cyanocobalamin (VITAMIN B-12) 5000 MCG TBDP Take 1 tablet by mouth daily.     . hydrochlorothiazide (HYDRODIURIL) 25 MG tablet TAKE ONE TABLET BY MOUTH ONE TIME DAILY  90 tablet 0  . lisinopril (ZESTRIL) 10 MG tablet TAKE TWO TABLETS BY MOUTH DAILY 180 tablet 0  . meloxicam (MOBIC) 15 MG tablet One tab PO qAM with a meal for 2 weeks, then daily prn pain. 30 tablet 3  . metoprolol tartrate (LOPRESSOR) 25 MG tablet TAKE HALF TABLET BY MOUTH TWICE DAILY  90 tablet 2  . Multiple Minerals-Vitamins (CALCIUM CITRATE PLUS PO) Take 2 tablets by mouth daily.     . Multiple Vitamin (MULTI-VITAMINS) TABS Take by mouth.     . rosuvastatin (CRESTOR) 10 MG tablet TAKE ONE TABLET BY MOUTH ONE TIME DAILY 90 tablet 0  . omega-3 acid ethyl esters (LOVAZA) 1 g capsule Take 1 capsule (1 g total) by mouth 2 (two) times daily. 180 capsule 2   No current facility-administered medications on file prior to visit.   Past Medical History:  Diagnosis Date  . Allergic rhinitis   . Arthralgia 09/30/2015  . Bronchitis, acute 01/09/2017  . Chronic shoulder pain 12/07/2016  . Epistaxis 09/30/2015  . Globus sensation 02/23/2015  . Heart murmur 12/07/2016  . HTN (hypertension)   . Left hip pain 09/30/2015  . Medicare annual wellness visit, subsequent 04/14/2015  . Pain in the wrist, unspecified laterality 03/03/2014  . Tachycardia    Episodic   Allergies  Allergen Reactions  . Fenofibrate Other (See Comments)    Abdominal pain  . Penicillins Rash    REACTION: Hives Anaphylaxis    Social History   Socioeconomic History  . Marital status: Married    Spouse name: Not on file  . Number of children: 1  . Years of education: Not on file  . Highest education level: Not on file  Occupational History  . Occupation: Dentist: RETIRED  Tobacco Use  . Smoking status: Never Smoker  . Smokeless tobacco: Never Used  Substance and Sexual Activity  . Alcohol use: Yes    Comment:  occ  . Drug use: No  . Sexual activity: Yes    Comment: lives with husband  Other Topics Concern  . Not on file  Social History Narrative   University - [place], Masters Degree - counseling,  [employer] counseling   Married '69 - 7 years, divorced; married '82   21 daughter - '72; 2 grandchildren      Regular Exercise -  YES         Social Determinants of Health   Financial Resource Strain: Low Risk   . Difficulty of Paying Living Expenses: Not hard at all  Food Insecurity: No Food Insecurity  . Worried About Programme researcher, broadcasting/film/video in the Last Year: Never true  . Ran Out of Food in the Last Year: Never true  Transportation Needs: No Transportation Needs  . Lack of Transportation (Medical): No  . Lack of Transportation (Non-Medical): No  Physical Activity: Inactive  . Days of Exercise per Week: 0 days  . Minutes of Exercise per Session: 0 min  Stress: No Stress Concern Present  . Feeling of Stress : Not at all  Social Connections: Moderately Isolated  . Frequency of Communication with Friends and Family: More than three times a week  . Frequency of Social Gatherings with Friends and Family: Twice a week  . Attends Religious Services: Never  . Active Member of Clubs or Organizations: No  . Attends Banker Meetings: Never  . Marital Status: Married   Vitals:   03/26/20 1154  BP: 110/70  Pulse: 90  Resp: 16  SpO2: 96%   Body mass index is 23.68 kg/m.  Physical Exam Vitals and nursing note reviewed.  Constitutional:      General: She is not in acute distress.    Appearance: She is well-developed and normal weight.  HENT:     Head: Normocephalic and atraumatic.     Mouth/Throat:     Mouth: Mucous membranes are moist.     Pharynx: Oropharynx is clear.  Eyes:     Conjunctiva/sclera: Conjunctivae normal.     Pupils: Pupils are equal, round, and reactive to light.  Cardiovascular:     Rate and Rhythm: Normal rate and regular rhythm.     Heart sounds: Murmur (Soft SEM RUSB) heard.      Comments: DP pulses are present. Pulmonary:     Effort: Pulmonary effort is normal. No respiratory distress.     Breath sounds: Normal breath sounds.    Abdominal:     Palpations: Abdomen is soft. There is no hepatomegaly or mass.     Tenderness: There is no abdominal tenderness.  Musculoskeletal:     Right knee: Effusion present. No erythema.  Lymphadenopathy:     Cervical: No cervical adenopathy.  Skin:    General: Skin is warm.     Findings: No erythema or rash.  Neurological:     Mental Status: She is alert and oriented to person, place, and time.     Cranial Nerves: No cranial nerve deficit.     Gait: Gait normal.     Comments: Antalgic gait, not assisted.  Psychiatric:        Mood and Affect: Mood and affect normal.     Comments: Well groomed, good eye contact.   ASSESSMENT AND PLAN:  Eileen Hansen was seen today for 5 months follow-up.  Orders Placed This Encounter  Procedures  . Lipid panel  . BASIC METABOLIC PANEL WITH GFR  . Hemoglobin A1c  . Ambulatory referral to Cardiology   Lab Results  Component Value Date   CHOL 120 03/26/2020   HDL 37 (L) 03/26/2020   LDLCALC 64 03/26/2020   LDLDIRECT 152.0 06/25/2019   TRIG 100 03/26/2020   CHOLHDL 3.2 03/26/2020    Lab Results  Component Value Date   HGBA1C 5.5 03/26/2020   Lab Results  Component Value Date   CREATININE 0.71 03/26/2020   BUN 21 03/26/2020   NA 140 03/26/2020   K 4.0 03/26/2020   CL 106 03/26/2020   CO2 26 03/26/2020    Hyperlipidemia, mixed Continue Crestor 10 mg daily and Lovaza 1 g twice daily. 2-4 ounces of wine a few times per week may help with increasing HDL. Depending of results, we can consider changing to Pravastatin or decreasing dose of Crestor or adding Q10 coenz supplementation.  Essential hypertension Adequately controlled. For now no changes in current management. Instructed to monitor BP at home and if on lower normal range or lower we could consider decreasing or stopping some of antihypertensive agents.  Prediabetes Continue a healthy life style for primary prevention of  diabetes. Further recommendation will  be given according to A1c result.  Primary osteoarthritis of right knee Pain is still not adequately controlled. We discussed dx,prognosis,and treatment options. Following with ortho.  Heart murmur It is soft and she is asymptomatic.Reviewing records, I heard in 2019 during initial visit. Because she is concerned about this and the fact that she may need pro-op for right knee surgery, cardio appt will be arranged. Instructed about warning signs.  Spent 40 minutes with pt. During this time history was obtained and documented, examination was performed, prior labs are reviewed, and plan discussed.   Return in about 6 months (around 09/23/2020).   Jayr Lupercio G. [name], MD  [facility]. [place] office.  A few things to remember from today's visit:  Hyperlipidemia, mixed - Plan: Lipid panel  Prediabetes - Plan: Hemoglobin A1c  Essential hypertension - Plan: BASIC METABOLIC PANEL WITH GFR  Heart murmur - Plan: Ambulatory referral to Cardiology  No changes today.  If you need refills please call your pharmacy. Do not use My Chart to request refills or for acute issues that need immediate attention.    Please be sure medication list is accurate. If a new problem present, please set up appointment sooner than planned today.

## 2020-03-26 NOTE — Assessment & Plan Note (Signed)
Continue a healthy lifestyle for primary prevention of diabetes. Further recommendation will be given according to A1c result. 

## 2020-03-26 NOTE — Assessment & Plan Note (Addendum)
Pain is still not adequately controlled. We discussed dx,prognosis,and treatment options. Following with ortho.

## 2020-03-26 NOTE — Assessment & Plan Note (Addendum)
Continue Crestor 10 mg daily and Lovaza 1 g twice daily. 2-4 ounces of wine a few times per week may help with increasing HDL. Depending of results, we can consider changing to Pravastatin or decreasing dose of Crestor or adding Q10 coenz supplementation.

## 2020-03-26 NOTE — Assessment & Plan Note (Addendum)
It is soft and she is asymptomatic.Reviewing records, I heard in 2019 during initial visit. Because she is concerned about this and the fact that she may need pro-op for right knee surgery, cardio appt will be arranged. Instructed about warning signs.

## 2020-03-26 NOTE — Assessment & Plan Note (Addendum)
Adequately controlled. For now no changes in current management. Instructed to monitor BP at home and if on lower normal range or lower we could consider decreasing or stopping some of antihypertensive agents.

## 2020-03-27 LAB — BASIC METABOLIC PANEL WITH GFR
BUN: 21 mg/dL (ref 7–25)
CO2: 26 mmol/L (ref 20–32)
Calcium: 10.2 mg/dL (ref 8.6–10.4)
Chloride: 106 mmol/L (ref 98–110)
Creat: 0.71 mg/dL (ref 0.60–0.93)
GFR, Est African American: 96 mL/min/{1.73_m2} (ref >=60)
GFR, Est Non African American: 83 mL/min/{1.73_m2} (ref >=60)
Glucose, Bld: 96 mg/dL (ref 65–99)
Potassium: 4 mmol/L (ref 3.5–5.3)
Sodium: 140 mmol/L (ref 135–146)

## 2020-03-27 LAB — LIPID PANEL
Cholesterol: 120 mg/dL (ref <200)
HDL: 37 mg/dL — ABNORMAL LOW (ref >=50)
LDL Cholesterol (Calc): 64 mg/dL (calc)
Non-HDL Cholesterol (Calc): 83 mg/dL (calc) (ref <130)
Total CHOL/HDL Ratio: 3.2 (calc) (ref <5.0)
Triglycerides: 100 mg/dL (ref <150)

## 2020-03-27 LAB — HEMOGLOBIN A1C
Hgb A1c MFr Bld: 5.5 % of total Hgb (ref <5.7)
Mean Plasma Glucose: 111 (calc)
eAG (mmol/L): 6.2 (calc)

## 2020-04-04 NOTE — Progress Notes (Signed)
Cardiology Office Note:    Date:  04/09/2020   ID:  Eileen Hansen, DOB January 07, 1946, MRN [id number]  PCP:  [name], [name], MD  [facility] Cardiologist:  Meriam Sprague, MD  [facility] Electrophysiologist:  None   Referring MD: [name], [name], MD      Patient Profile:    Eileen Hansen is a 74 y.o. female with history of essential hypertension, HLD, prediabetes and osteoarthritis who was referred by Dr. [name] due to a heart murmur.    History of Present Illness:    Eileen Hansen  Is a 74 year old female with history of essential HTN, HLD, prediabetes, and OA who was referred by Dr. [name] for evaluation of her heart murmur.  Reviewed Dr. Elvis Coil note dated 03/26/2020. Patient has a history of hypertension that was diagnosed over 10 years ago. She is currently on metop tartrate 25mg  BID, lisinopril 10mg  daily and HCTZ 25mg  daily with well controlled blood pressures. No symptoms of chest pain, SOB, palpitations, lightheadedness or dizziness. PCP noted soft systolic murmur on exam for which she was referred to Cardiology for further evaluation.  Today, the patient states that she is concerned about her right calf and knee pain. She reports that back in March she had severe right calf pain. RLE doppler negative for DVT. She was seen by an orthopedist and it was thought that she was having sciatica. She underwent PT and then her right knee began to hurt. She was diagnosed with osteoarthritis of her right knee. She had injections with hyaluronic acid without significant improvement. She is concerned that she may have PAD. Of note, her symptoms usually occur after prolonged sitting or in the morning when she feels stiff. No exertional symptoms. No LE edema. No chest pain, SOB, decreased exercise tolerance, PND, orthopnea or LE edema. Strong DP pulses.  Of note, last TTE 2018 with normal BiV function. Mild MR, mild AR.  Carotids in 10/2014 with minimal plaque without significant  stenosis.  Cholesterol well controlled. Total cholesterol 123, HDL 31, LDL 61  Last Cr 0.65, K 4.1, BUN 25.  Prior CV studies: TTE 11/2016:  Study Conclusions  - Left ventricle: The cavity size was normal. Wall thickness was  increased in a pattern of mild LVH. Systolic function was normal.  The estimated ejection fraction was in the range of 60% to 65%.  Wall motion was normal; there were no regional wall motion  abnormalities.  - Aortic valve: There was mild regurgitation.  - Mitral valve: There was mild regurgitation.  - Left atrium: The atrium was mildly dilated.  - Atrial septum: No defect or patent foramen ovale was identified.   Carotid artery ultrasound 11/07/2016: IMPRESSION: Minimal plaque in the carotid arteries. No significant carotid artery stenosis. Patent vertebral arteries.  RLE doppler ultrasound 10/07/19: No DVT   Past Medical History:  Diagnosis Date  . Allergic rhinitis   . Arthralgia 09/30/2015  . Bronchitis, acute 01/09/2017  . Chronic shoulder pain 12/07/2016  . Epistaxis 09/30/2015  . Globus sensation 02/23/2015  . Heart murmur 12/07/2016  . HTN (hypertension)   . Left hip pain 09/30/2015  . Medicare annual wellness visit, subsequent 04/14/2015  . Pain in the wrist, unspecified laterality 03/03/2014  . Tachycardia    Episodic    Current Medications: Current Meds  Medication Sig  . Cholecalciferol (VITAMIN D3) 2000 units TABS Take 1 tablet by mouth daily.  . COLLAGEN PO Take by mouth daily.  . hydrochlorothiazide (HYDRODIURIL) 25  MG tablet TAKE ONE TABLET BY MOUTH ONE TIME DAILY  . lisinopril (ZESTRIL) 10 MG tablet TAKE TWO TABLETS BY MOUTH DAILY  . meloxicam (MOBIC) 15 MG tablet One tab PO qAM with a meal for 2 weeks, then daily prn pain.  . metoprolol tartrate (LOPRESSOR) 25 MG tablet TAKE HALF TABLET BY MOUTH TWICE DAILY   . rosuvastatin (CRESTOR) 10 MG tablet TAKE ONE TABLET BY MOUTH ONE TIME DAILY  . TURMERIC PO Take 1 capsule by mouth  in the morning and at bedtime.     Allergies:   Fenofibrate and Penicillins   Social History   Tobacco Use  . Smoking status: Never Smoker  . Smokeless tobacco: Never Used  Substance Use Topics  . Alcohol use: Yes    Comment:  occ  . Drug use: No     Family Hx: The patient's family history includes Cancer in her brother and father; Diabetes in her mother; Heart attack in her brother and mother; Hyperlipidemia in her mother; Hypertension in her mother; Stroke in her mother. There is no history of Colon cancer, Breast cancer, or Coronary artery disease.  ROS  The patient denies chest pain, chest pressure, dyspnea at rest or with exertion, palpitations, PND, orthopnea, or leg swelling. Denies cough, fever, chills. Denies nausea, vomiting. Denies syncope or presyncope. Denies dizziness or lightheadedness. Denies snoring.  EKGs/Labs/Other Test Reviewed:    EKG:  EKG is ordered today.  The ekg ordered today demonstrates NSR with HR 87.   Recent Labs: 06/25/2019: TSH 2.63 10/16/2019: Hemoglobin 13.6; Platelets 277 10/24/2019: ALT 20 03/26/2020: BUN 21; Creat 0.71; Potassium 4.0; Sodium 140   Recent Lipid Panel Lab Results  Component Value Date/Time   CHOL 120 03/26/2020 12:59 PM   TRIG 100 03/26/2020 12:59 PM   HDL 37 (L) 03/26/2020 12:59 PM   CHOLHDL 3.2 03/26/2020 12:59 PM   LDLCALC 64 03/26/2020 12:59 PM   LDLDIRECT 152.0 06/25/2019 03:36 PM    Physical Exam:    VS:  BP 140/88   Pulse 87   Ht 5' (1.524 m)   Wt 121 lb 9.6 oz (55.2 kg)   SpO2 97%   BMI 23.75 kg/m     Wt Readings from Last 3 Encounters:  04/09/20 121 lb 9.6 oz (55.2 kg)  03/26/20 121 lb 4 oz (55 kg)  01/07/20 120 lb (54.4 kg)     Physical Exam  GEN: Well nourished, well developed, in no acute distress HEENT: normal Neck: no JVD, carotid bruits, or masses Cardiac: RR, 2/6 systolic murmur and 1/6 diastolic murmur noted. No rubs, or gallops,no edema  Respiratory:  clear to auscultation bilaterally,  normal work of breathing GI: soft, nontender, nondistended, + BS MS: no deformity or atrophy Skin: warm and dry, no rash Neuro:  Alert and Oriented x 3, Strength and sensation are intact Psych: euthymic mood, full affect  ASSESSMENT & PLAN:   #Heart Murmur: Patient with soft systolic and diastolic murmurs on exam. TTE in 2018 reassuring with normal LV function, mild MR, mild AR. Patient is notably asymptomatic without shortness of breath, LE edema, chest pain, lightheadedness, orthopnea or PND. Given reassuring examination and lack of symptoms, do not need to pursue further work-up at this time. Will continue to monitor. -Follow-up with TTE next year for continued surveillance of mild MR/AR -No further work-up from cardiology standpoint needed if patient were to pursue surgery for her right knee  #Mild Aortic Regurgitation: #Mild Mitral Regurgitation: -Last TTE 11/2016, patient doing well and  asymptomatic -Will follow with TTE every 3-5 years; plan on repeat in next visit in 1 year  #Essential Hypertension: -Well controlled on current regimen and managed by PCP -Continue amlopdipine and HCTZ -Continue metoprolol 25mg  BID   #Right LE Pain: Low suspicion for PAD given reassuring examination and lack of claudication symptoms. RLE ultrasound negative for DVT. Suspect orthopedic etiology. -Continue follow-up with PCP and ortho -No DVT on ultrasound in 09/2019 -Strong pulses on exam with low suspicion of PAD  #Hyperlipidemia: -Well controlled. Last LDL 61 in 10/24/19 -Continue crestor 10mg  per PCP  #Prediabetes: -HgA1C 6.1 -Continue lifestyle modifications including diet rich in fruit, vegetables and lean proteins as well as regular exercise with goal 12/24/19 of moderate exercise weekly -Follows regularly with PCP  Dispo:  No follow-ups on file.   Medication Adjustments/Labs and Tests Ordered: Current medicines are reviewed at length with the patient today.  Concerns regarding  medicines are outlined above.  Tests Ordered: Orders Placed This Encounter  Procedures  . ECHOCARDIOGRAM COMPLETE   Medication Changes: No orders of the defined types were placed in this encounter.   Signed, , MD  04/09/2020 10:40 AM    [facility] [address] [place], [place], [place]  [place] Phone: [phone]; Fax: [phone]

## 2020-04-09 ENCOUNTER — Other Ambulatory Visit: Payer: Self-pay

## 2020-04-09 ENCOUNTER — Encounter: Payer: Self-pay | Admitting: Cardiology

## 2020-04-09 ENCOUNTER — Ambulatory Visit: Payer: Medicare PPO | Admitting: Cardiology

## 2020-04-09 VITALS — BP 140/88 | HR 87 | Ht 60.0 in | Wt 121.6 lb

## 2020-04-09 DIAGNOSIS — R7303 Prediabetes: Secondary | ICD-10-CM

## 2020-04-09 DIAGNOSIS — I351 Nonrheumatic aortic (valve) insufficiency: Secondary | ICD-10-CM | POA: Diagnosis not present

## 2020-04-09 DIAGNOSIS — I340 Nonrheumatic mitral (valve) insufficiency: Secondary | ICD-10-CM

## 2020-04-09 DIAGNOSIS — E7800 Pure hypercholesterolemia, unspecified: Secondary | ICD-10-CM

## 2020-04-09 DIAGNOSIS — R011 Cardiac murmur, unspecified: Secondary | ICD-10-CM

## 2020-04-09 NOTE — Patient Instructions (Addendum)
Medication Instructions:  *If you need a refill on your cardiac medications before your next appointment, please call your pharmacy*  Lab Work:  If you have labs (blood work) drawn today and your tests are completely normal, you will receive your results only by: Marland Kitchen MyChart Message (if you have MyChart) OR . A paper copy in the mail If you have any lab test that is abnormal or we need to change your treatment, we will call you to review the results.  Testing/Procedures: Your physician has requested that you have an echocardiogram in 1 year. Echocardiography is a painless test that uses sound waves to create images of your heart. It provides your doctor with information about the size and shape of your heart and how well your heart's chambers and valves are working. This procedure takes approximately one hour. There are no restrictions for this procedure.  Follow-Up: At [facility], you and your health needs are our priority.  As part of our continuing mission to provide you with exceptional heart care, we have created designated Provider Care Teams.  These Care Teams include your primary Cardiologist (physician) and Advanced Practice Providers (APPs -  Physician Assistants and Nurse Practitioners) who all work together to provide you with the care you need, when you need it.  We recommend signing up for the patient portal called "MyChart".  Sign up information is provided on this After Visit Summary.  MyChart is used to connect with patients for Virtual Visits (Telemedicine).  Patients are able to view lab/test results, encounter notes, upcoming appointments, etc.  Non-urgent messages can be sent to your provider as well.   To learn more about what you can do with MyChart, go to [email or web address].    Your next appointment:   12 month(s)  The format for your next appointment:   In Person  Provider:   You may see Meriam Sprague, MD or one of the following Advanced Practice  Providers on your designated Care Team:    Tereso Newcomer, PA-C  [place], [place]

## 2020-04-09 NOTE — Addendum Note (Signed)
Addended by: Virl Axe, Cedrik Heindl L on: 04/09/2020 11:45 AM   Modules accepted: Orders

## 2020-04-18 DIAGNOSIS — H527 Unspecified disorder of refraction: Secondary | ICD-10-CM | POA: Diagnosis not present

## 2020-04-18 DIAGNOSIS — H47323 Drusen of optic disc, bilateral: Secondary | ICD-10-CM | POA: Diagnosis not present

## 2020-04-18 DIAGNOSIS — H02834 Dermatochalasis of left upper eyelid: Secondary | ICD-10-CM | POA: Diagnosis not present

## 2020-04-18 DIAGNOSIS — H25813 Combined forms of age-related cataract, bilateral: Secondary | ICD-10-CM | POA: Diagnosis not present

## 2020-04-18 DIAGNOSIS — H02831 Dermatochalasis of right upper eyelid: Secondary | ICD-10-CM | POA: Diagnosis not present

## 2020-04-21 DIAGNOSIS — M1711 Unilateral primary osteoarthritis, right knee: Secondary | ICD-10-CM | POA: Diagnosis not present

## 2020-04-28 ENCOUNTER — Other Ambulatory Visit: Payer: Self-pay | Admitting: Family Medicine

## 2020-04-28 DIAGNOSIS — I10 Essential (primary) hypertension: Secondary | ICD-10-CM

## 2020-04-30 NOTE — Addendum Note (Signed)
Addended by: Weyman Croon E on: 04/30/2020 01:54 PM   Modules accepted: Orders

## 2020-05-01 DIAGNOSIS — Z1231 Encounter for screening mammogram for malignant neoplasm of breast: Secondary | ICD-10-CM | POA: Diagnosis not present

## 2020-05-02 ENCOUNTER — Ambulatory Visit: Payer: Medicare PPO

## 2020-05-05 ENCOUNTER — Other Ambulatory Visit: Payer: Self-pay

## 2020-05-05 ENCOUNTER — Ambulatory Visit (INDEPENDENT_AMBULATORY_CARE_PROVIDER_SITE_OTHER)
Admission: RE | Admit: 2020-05-05 | Discharge: 2020-05-05 | Disposition: A | Discharge status: Home / Self Care | Payer: Medicare PPO | Source: Ambulatory Visit | Attending: Family Medicine | Admitting: Family Medicine

## 2020-05-05 DIAGNOSIS — E2839 Other primary ovarian failure: Secondary | ICD-10-CM

## 2020-06-02 DIAGNOSIS — M1711 Unilateral primary osteoarthritis, right knee: Secondary | ICD-10-CM | POA: Diagnosis not present

## 2020-06-13 ENCOUNTER — Other Ambulatory Visit: Payer: Self-pay | Admitting: Family Medicine

## 2020-06-18 ENCOUNTER — Other Ambulatory Visit: Payer: Self-pay | Admitting: Family Medicine

## 2020-07-05 ENCOUNTER — Other Ambulatory Visit: Payer: Self-pay | Admitting: Family Medicine

## 2020-07-25 DIAGNOSIS — M1711 Unilateral primary osteoarthritis, right knee: Secondary | ICD-10-CM | POA: Diagnosis not present

## 2020-09-19 ENCOUNTER — Other Ambulatory Visit: Payer: Self-pay | Admitting: Family Medicine

## 2020-10-15 ENCOUNTER — Other Ambulatory Visit: Payer: Self-pay | Admitting: Family Medicine

## 2020-12-09 IMAGING — US US EXTREM LOW VENOUS*R*
1 series · 12 of 24 positions shown · non-contrast
Comparison: None.
COMPARISON: None.

Addendum:
CLINICAL DATA: 73-year-old male with right calf pain for the past 2
weeks



[Series 1: us extrem low venous*right* · 26 acquisitions, 12 frames shown]
[im 2/26]
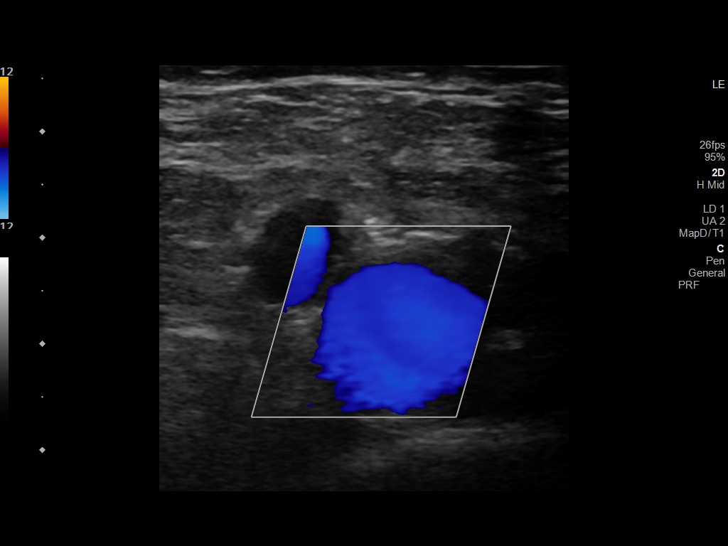
[im 4/26]
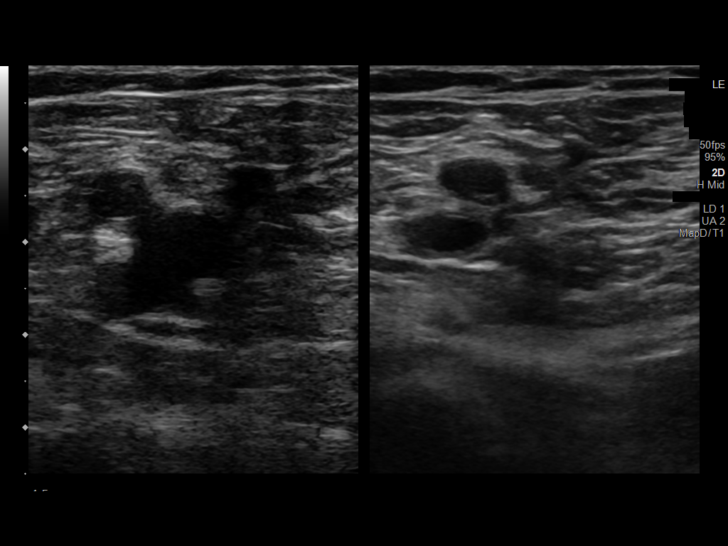
[im 6/26]
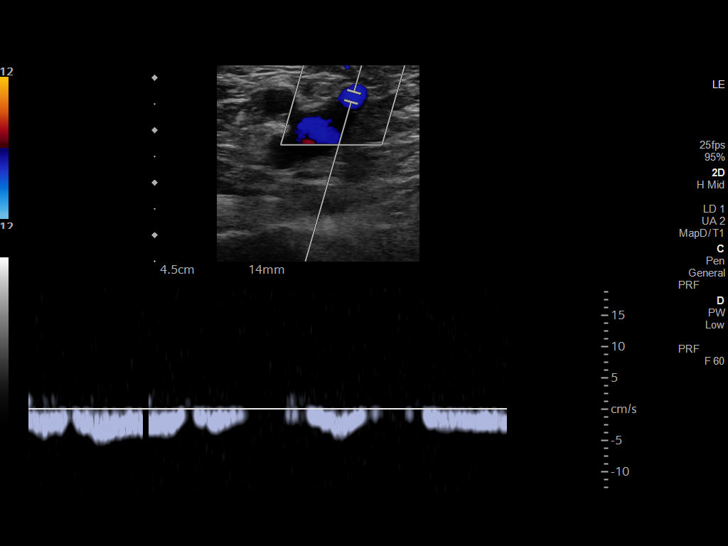
[im 8/26]
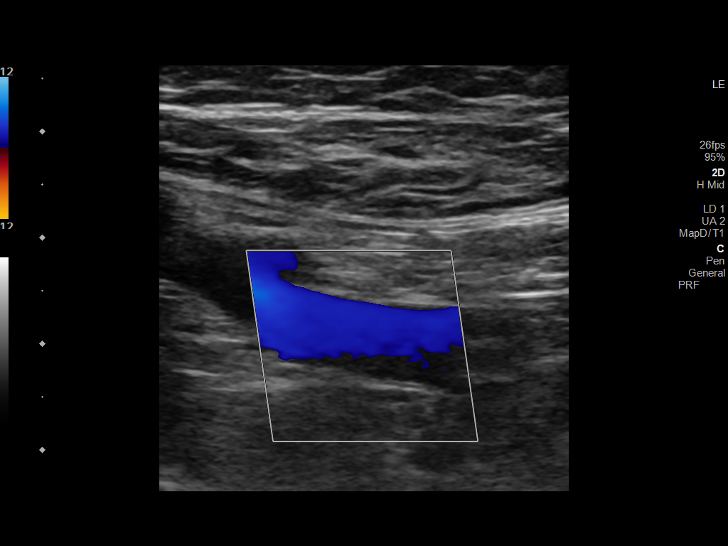
[im 10/26]
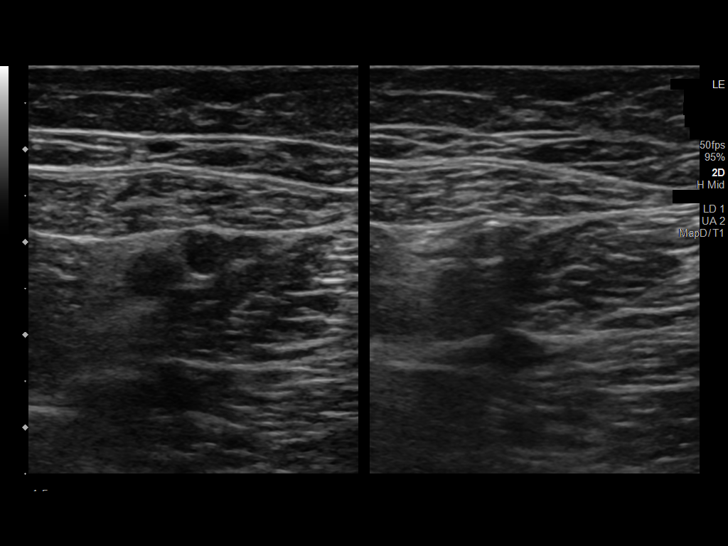
[im 12/26]
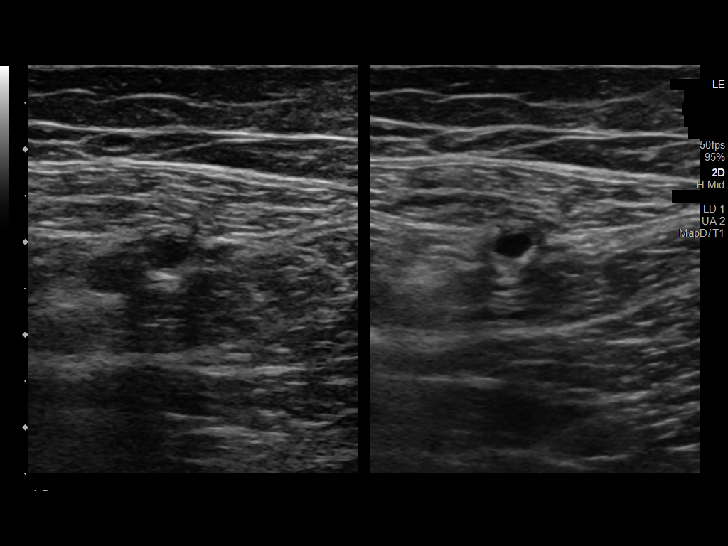
[im 16/26]
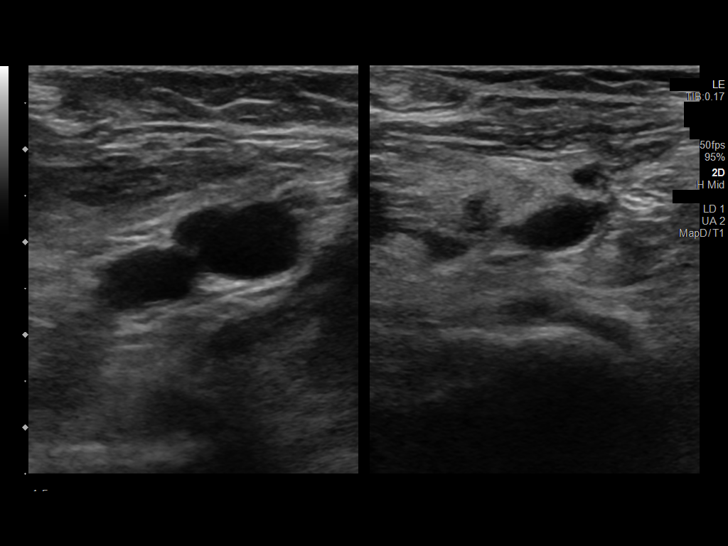
[im 18/26]
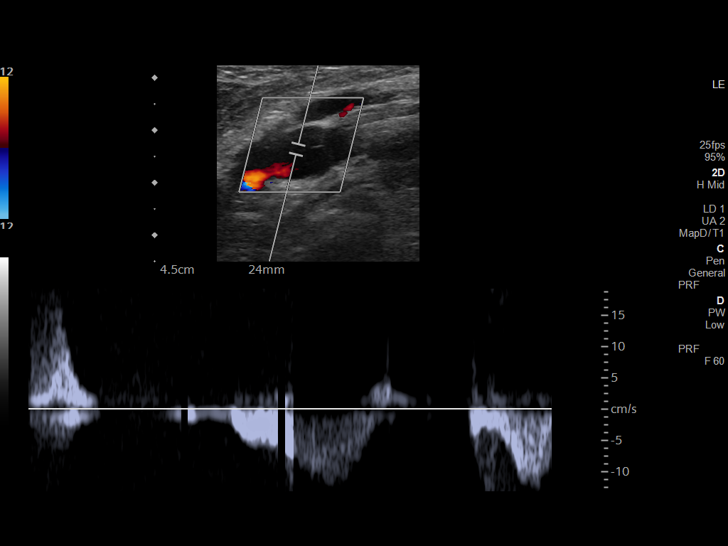
[im 20/26]
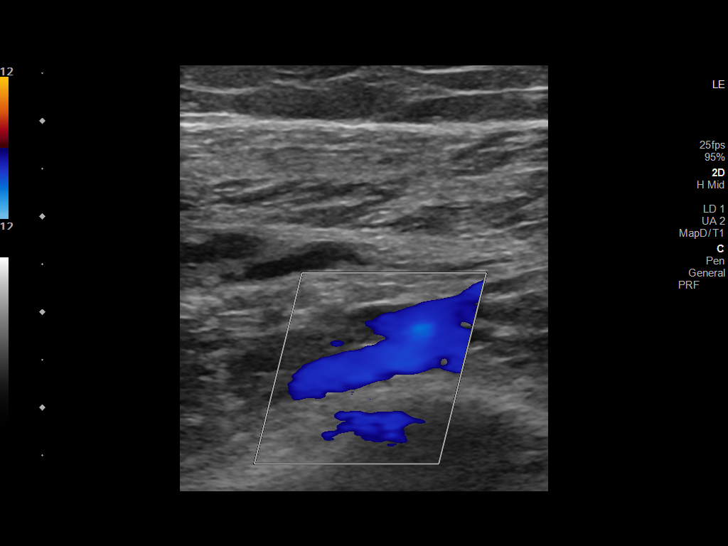
[im 22/26]
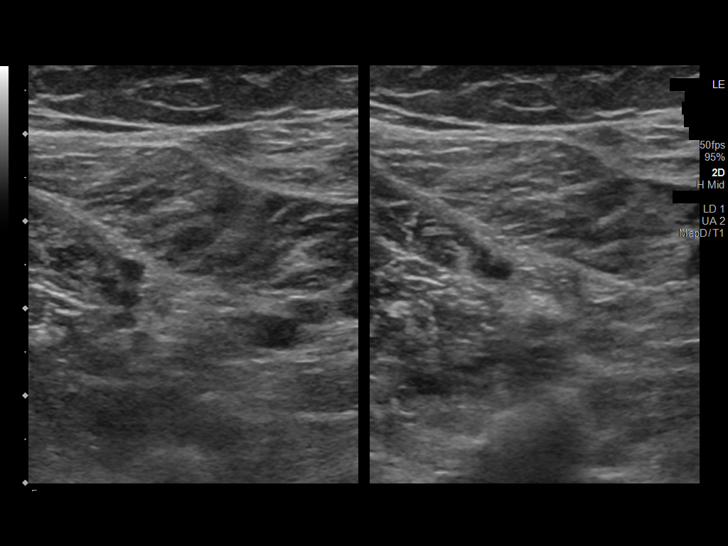
[im 23/26]
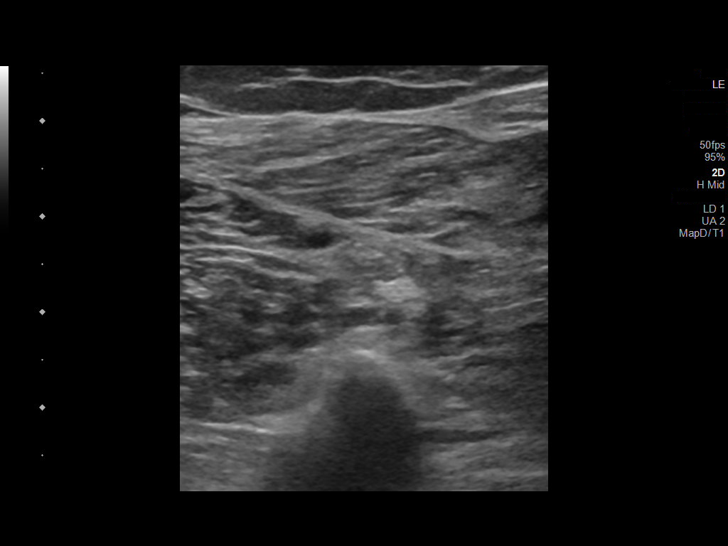
[im 26/26]
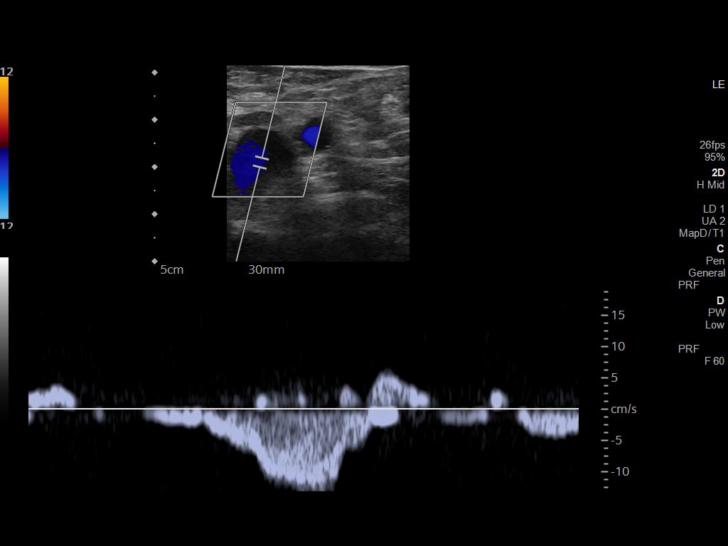

[12 of 24 positions shown; findings below may reference images not displayed]

FINDINGS: Contralateral Common Femoral Vein: Respiratory phasicity is normal
and symmetric with the symptomatic side. No evidence of thrombus.
Normal compressibility.

Common Femoral Vein: No evidence of thrombus. Normal
compressibility, respiratory phasicity and response to augmentation.

Saphenofemoral Junction: No evidence of thrombus. Normal
compressibility and flow on color Doppler imaging.

Profunda Femoral Vein: No evidence of thrombus. Normal
compressibility and flow on color Doppler imaging.

Femoral Vein: No evidence of thrombus. Normal compressibility,
respiratory phasicity and response to augmentation.

Popliteal Vein: No evidence of thrombus. Normal compressibility,
respiratory phasicity and response to augmentation.

Calf Veins: No evidence of thrombus. Normal compressibility and flow
on color Doppler imaging.

Superficial Great Saphenous Vein: No evidence of thrombus. Normal
compressibility.

Venous Reflux:  None.

Other Findings:  None.
IMPRESSION: No evidence of deep venous thrombosis.

ADDENDUM:
In the clinical data section for this report, it should state that
this is a 73-year-old female with right calf pain.

*** End of Addendum ***
FINDINGS: Contralateral Common Femoral Vein: Respiratory phasicity is normal
and symmetric with the symptomatic side. No evidence of thrombus.
Normal compressibility.

Common Femoral Vein: No evidence of thrombus. Normal
compressibility, respiratory phasicity and response to augmentation.

Saphenofemoral Junction: No evidence of thrombus. Normal
compressibility and flow on color Doppler imaging.

Profunda Femoral Vein: No evidence of thrombus. Normal
compressibility and flow on color Doppler imaging.

Femoral Vein: No evidence of thrombus. Normal compressibility,
respiratory phasicity and response to augmentation.

Popliteal Vein: No evidence of thrombus. Normal compressibility,
respiratory phasicity and response to augmentation.

Calf Veins: No evidence of thrombus. Normal compressibility and flow
on color Doppler imaging.

Superficial Great Saphenous Vein: No evidence of thrombus. Normal
compressibility.

Venous Reflux:  None.

Other Findings:  None.
IMPRESSION: No evidence of deep venous thrombosis.

## 2021-02-03 ENCOUNTER — Telehealth: Payer: Self-pay | Admitting: Family Medicine

## 2021-02-03 NOTE — Telephone Encounter (Signed)
Left message for patient to call back and schedule Medicare Annual Wellness Visit (AWV) either virtually or in office.   Last AWV 02/13/20 please schedule at anytime with [facility] Nurse Health Advisor 1 or 2  Appt can be sooner calendar year [place] insurance  This should be a 45 minute visit.

## 2021-02-13 ENCOUNTER — Ambulatory Visit (INDEPENDENT_AMBULATORY_CARE_PROVIDER_SITE_OTHER): Payer: Medicare PPO

## 2021-02-13 ENCOUNTER — Other Ambulatory Visit: Payer: Self-pay

## 2021-02-13 VITALS — BP 130/70 | HR 58 | Temp 98.3°F | Ht 59.0 in | Wt 122.0 lb

## 2021-02-13 DIAGNOSIS — Z0000 Encounter for general adult medical examination without abnormal findings: Secondary | ICD-10-CM | POA: Diagnosis not present

## 2021-02-13 NOTE — Patient Instructions (Signed)
Eileen Hansen , Thank you for taking time to come for your Medicare Wellness Visit. I appreciate your ongoing commitment to your health goals. Please review the following plan we discussed and let me know if I can assist you in the future.   Screening recommendations/referrals: Colonoscopy: no longer required  Mammogram: no longer required  Bone Density: 05/05/2020 Recommended yearly ophthalmology/optometry visit for glaucoma screening and checkup Recommended yearly dental visit for hygiene and checkup  Vaccinations: Influenza vaccine: due flu fall 2022  Pneumococcal vaccine: completed series  Tdap vaccine: 03/29/2011  due 03/2021 Shingles vaccine: will obtain obtain local pharmacy     Advanced directives: will provided copies   Conditions/risks identified: none   Next appointment: 02/17/2021  230pm  Dr [name]    Preventive Care 77 Years and Older, Female Preventive care refers to lifestyle choices and visits with your health care provider that can promote health and wellness. What does preventive care include? A yearly physical exam. This is also called an annual well check. Dental exams once or twice a year. Routine eye exams. Ask your health care provider how often you should have your eyes checked. Personal lifestyle choices, including: Daily care of your teeth and gums. Regular physical activity. Eating a healthy diet. Avoiding tobacco and drug use. Limiting alcohol use. Practicing safe sex. Taking low-dose aspirin every day. Taking vitamin and mineral supplements as recommended by your health care provider. What happens during an annual well check? The services and screenings done by your health care provider during your annual well check will depend on your age, overall health, lifestyle risk factors, and family history of disease. Counseling  Your health care provider may ask you questions about your: Alcohol use. Tobacco use. Drug use. Emotional well-being. Home and  relationship well-being. Sexual activity. Eating habits. History of falls. Memory and ability to understand (cognition). Work and work Astronomer. Reproductive health. Screening  You may have the following tests or measurements: Height, weight, and BMI. Blood pressure. Lipid and cholesterol levels. These may be checked every 5 years, or more frequently if you are over 77 years old. Skin check. Lung cancer screening. You may have this screening every year starting at age 77 if you have a 30-pack-year history of smoking and currently smoke or have quit within the past 15 years. Fecal occult blood test (FOBT) of the stool. You may have this test every year starting at age 77. Flexible sigmoidoscopy or colonoscopy. You may have a sigmoidoscopy every 5 years or a colonoscopy every 10 years starting at age 77. Hepatitis C blood test. Hepatitis B blood test. Sexually transmitted disease (STD) testing. Diabetes screening. This is done by checking your blood sugar (glucose) after you have not eaten for a while (fasting). You may have this done every 1-3 years. Bone density scan. This is done to screen for osteoporosis. You may have this done starting at age 77. Mammogram. This may be done every 1-2 years. Talk to your health care provider about how often you should have regular mammograms. Talk with your health care provider about your test results, treatment options, and if necessary, the need for more tests. Vaccines  Your health care provider may recommend certain vaccines, such as: Influenza vaccine. This is recommended every year. Tetanus, diphtheria, and acellular pertussis (Tdap, Td) vaccine. You may need a Td booster every 10 years. Zoster vaccine. You may need this after age 24. Pneumococcal 13-valent conjugate (PCV13) vaccine. One dose is recommended after age 77. Pneumococcal polysaccharide (PPSV23) vaccine.  One dose is recommended after age 77. Talk to your health care provider  about which screenings and vaccines you need and how often you need them. This information is not intended to replace advice given to you by your health care provider. Make sure you discuss any questions you have with your health care provider. Document Released: 08/01/2015 Document Revised: 03/24/2016 Document Reviewed: 05/06/2015 Elsevier Interactive Patient Education  2017 [place] Prevention in the Home Falls can cause injuries. They can happen to people of all ages. There are many things you can do to make your home safe and to help prevent falls. What can I do on the outside of my home? Regularly fix the edges of walkways and driveways and fix any cracks. Remove anything that might make you trip as you walk through a door, such as a raised step or threshold. Trim any bushes or trees on the path to your home. Use bright outdoor lighting. Clear any walking paths of anything that might make someone trip, such as rocks or tools. Regularly check to see if handrails are loose or broken. Make sure that both sides of any steps have handrails. Any raised decks and porches should have guardrails on the edges. Have any leaves, snow, or ice cleared regularly. Use sand or salt on walking paths during winter. Clean up any spills in your garage right away. This includes oil or grease spills. What can I do in the bathroom? Use night lights. Install grab bars by the toilet and in the tub and shower. Do not use towel bars as grab bars. Use non-skid mats or decals in the tub or shower. If you need to sit down in the shower, use a plastic, non-slip stool. Keep the floor dry. Clean up any water that spills on the floor as soon as it happens. Remove soap buildup in the tub or shower regularly. Attach bath mats securely with double-sided non-slip rug tape. Do not have throw rugs and other things on the floor that can make you trip. What can I do in the bedroom? Use night lights. Make sure  that you have a light by your bed that is easy to reach. Do not use any sheets or blankets that are too big for your bed. They should not hang down onto the floor. Have a firm chair that has side arms. You can use this for support while you get dressed. Do not have throw rugs and other things on the floor that can make you trip. What can I do in the kitchen? Clean up any spills right away. Avoid walking on wet floors. Keep items that you use a lot in easy-to-reach places. If you need to reach something above you, use a strong step stool that has a grab bar. Keep electrical cords out of the way. Do not use floor polish or wax that makes floors slippery. If you must use wax, use non-skid floor wax. Do not have throw rugs and other things on the floor that can make you trip. What can I do with my stairs? Do not leave any items on the stairs. Make sure that there are handrails on both sides of the stairs and use them. Fix handrails that are broken or loose. Make sure that handrails are as long as the stairways. Check any carpeting to make sure that it is firmly attached to the stairs. Fix any carpet that is loose or worn. Avoid having throw rugs at the top or bottom of  the stairs. If you do have throw rugs, attach them to the floor with carpet tape. Make sure that you have a light switch at the top of the stairs and the bottom of the stairs. If you do not have them, ask someone to add them for you. What else can I do to help prevent falls? Wear shoes that: Do not have high heels. Have rubber bottoms. Are comfortable and fit you well. Are closed at the toe. Do not wear sandals. If you use a stepladder: Make sure that it is fully opened. Do not climb a closed stepladder. Make sure that both sides of the stepladder are locked into place. Ask someone to hold it for you, if possible. Clearly mark and make sure that you can see: Any grab bars or handrails. First and last steps. Where the edge of  each step is. Use tools that help you move around (mobility aids) if they are needed. These include: Canes. Walkers. Scooters. Crutches. Turn on the lights when you go into a dark area. Replace any light bulbs as soon as they burn out. Set up your furniture so you have a clear path. Avoid moving your furniture around. If any of your floors are uneven, fix them. If there are any pets around you, be aware of where they are. Review your medicines with your doctor. Some medicines can make you feel dizzy. This can increase your chance of falling. Ask your doctor what other things that you can do to help prevent falls. This information is not intended to replace advice given to you by your health care provider. Make sure you discuss any questions you have with your health care provider. Document Released: 05/01/2009 Document Revised: 12/11/2015 Document Reviewed: 08/09/2014 Elsevier Interactive Patient Education  2017 [employer].

## 2021-02-13 NOTE — Progress Notes (Signed)
Subjective:   LARANDA BURKEMPER is a 75 y.o. female who presents for an Initial Medicare Annual Wellness Visit.  Review of Systems    N/a       Objective:    There were no vitals filed for this visit. There is no height or weight on file to calculate BMI.  Advanced Directives 02/13/2020 11/07/2019 10/17/2019 10/16/2019 09/02/2016 04/14/2015 10/17/2014  Does Patient Have a Medical Advance Directive? Yes No No No Yes No No  Type of Estate agent of [place];Living will - - - Public affairs consultant of [place];Living will - -  Does patient want to make changes to medical advance directive? No - Patient declined - - - - Yes - information given -  Copy of Healthcare Power of Attorney in Chart? No - copy requested - - - No - copy requested - -  Would patient like information on creating a medical advance directive? - No - Patient declined - - - Yes - Educational materials given No - patient declined information    Current Medications (verified) Outpatient Encounter Medications as of 02/13/2021  Medication Sig   Cholecalciferol (VITAMIN D3) 2000 units TABS Take 1 tablet by mouth daily.   COLLAGEN PO Take by mouth daily.   hydrochlorothiazide (HYDRODIURIL) 25 MG tablet TAKE ONE TABLET BY MOUTH ONE TIME DAILY   lisinopril (ZESTRIL) 10 MG tablet TAKE TWO TABLETS BY MOUTH DAILY   meloxicam (MOBIC) 15 MG tablet One tab PO qAM with a meal for 2 weeks, then daily prn pain.   metoprolol tartrate (LOPRESSOR) 25 MG tablet TAKE HALF TABLET BY MOUTH TWICE DAILY   omega-3 acid ethyl esters (LOVAZA) 1 g capsule TAKE ONE CAPSULE BY MOUTH TWICE DAILY   rosuvastatin (CRESTOR) 10 MG tablet TAKE ONE TABLET BY MOUTH ONE TIME DAILY   TURMERIC PO Take 1 capsule by mouth in the morning and at bedtime.   No facility-administered encounter medications on file as of 02/13/2021.    Allergies (verified) Fenofibrate and Penicillins   History: Past Medical History:  Diagnosis Date   Allergic rhinitis     Arthralgia 09/30/2015   Bronchitis, acute 01/09/2017   Chronic shoulder pain 12/07/2016   Epistaxis 09/30/2015   Globus sensation 02/23/2015   Heart murmur 12/07/2016   HTN (hypertension)    Left hip pain 09/30/2015   Medicare annual wellness visit, subsequent 04/14/2015   Pain in the wrist, unspecified laterality 03/03/2014   Tachycardia    Episodic   Past Surgical History:  Procedure Laterality Date   APPENDECTOMY  75 yrs old   BUNIONECTOMY     WISDOM TOOTH EXTRACTION  75 yrs old   Family History  Problem Relation Age of Onset   Cancer Father        melanoma   Diabetes Mother    Hypertension Mother    Hyperlipidemia Mother    Stroke Mother    Heart attack Mother    Heart attack Brother    Cancer Brother        pancreatic   Colon cancer Neg Hx    Breast cancer Neg Hx    Coronary artery disease Neg Hx    Social History   Socioeconomic History   Marital status: Married    Spouse name: Not on file   Number of children: 1   Years of education: Not on file   Highest education level: Not on file  Occupational History   Occupation: Homemaker    Employer: RETIRED  Tobacco Use  Smoking status: Never   Smokeless tobacco: Never  Substance and Sexual Activity   Alcohol use: Yes    Comment:  occ   Drug use: No   Sexual activity: Yes    Comment: lives with husband  Other Topics Concern   Not on file  Social History Narrative   University - [place], Masters Degree - counseling, [employer]   Married '69 - 7 years, divorced; married '82   21 daughter - '72; 2 grandchildren      Regular Exercise -  YES         Social Determinants of Health   Financial Resource Strain: Low Risk    Difficulty of Paying Living Expenses: Not hard at all  Food Insecurity: No Food Insecurity   Worried About Programme researcher, broadcasting/film/videounning Out of Food in the Last Year: Never true   Ran Out of Food in the Last Year: Never true  Transportation Needs: No Transportation Needs   Lack of Transportation  (Medical): No   Lack of Transportation (Non-Medical): No  Physical Activity: Inactive   Days of Exercise per Week: 0 days   Minutes of Exercise per Session: 0 min  Stress: No Stress Concern Present   Feeling of Stress : Not at all  Social Connections: Moderately Isolated   Frequency of Communication with Friends and Family: More than three times a week   Frequency of Social Gatherings with Friends and Family: Twice a week   Attends Religious Services: Never   Database administratorActive Member of Clubs or Organizations: No   Attends Engineer, structuralClub or Organization Meetings: Never   Marital Status: Married    Tobacco Counseling Counseling given: Not Answered   Clinical Intake:                 Diabetic?no         Activities of Daily Living No flowsheet data found.  Patient Care Team: [place], [name], MD as PCP - General (Family Medicine) Meriam SpraguePemberton, Heather E, MD as PCP - Cardiology (Cardiology) Carrington ClampHorvath, Michelle, MD as Consulting Physician (Obstetrics and Gynecology) Rachael FeeJacobs, Daniel P, MD as Attending Physician (Gastroenterology) Carman ChingMiller, Steven L, OD (Optometry)  Indicate any recent Medical Services you may have received from other than [facility] providers in the past year (date may be approximate).     Assessment:   This is a routine wellness examination for [place].  Hearing/Vision screen No results found.  Dietary issues and exercise activities discussed:     Goals Addressed   None    Depression Screen PHQ 2/9 Scores 02/13/2020 01/08/2019 02/03/2018 09/02/2016 04/14/2015 04/05/2014  PHQ - 2 Score 0 0 0 0 0 0  PHQ- 9 Score 0 - - - - -    Fall Risk Fall Risk  02/13/2020 01/08/2019 02/03/2018 09/02/2016 04/14/2015  Falls in the past year? 1 0 No No Yes  Number falls in past yr: 0 0 - - 1  Injury with Fall? 1 0 - - Yes  Comment Abrasion to knee - - - -  Risk for fall due to : Orthopedic patient - - - -  Follow up Falls evaluation completed;Falls prevention discussed Education provided - - -     FALL RISK PREVENTION PERTAINING TO THE HOME:  Any stairs in or around the home? Yes  If so, are there any without handrails? No  Home free of loose throw rugs in walkways, pet beds, electrical cords, etc? Yes  Adequate lighting in your home to reduce risk of falls? Yes   ASSISTIVE  DEVICES UTILIZED TO PREVENT FALLS:  Life alert? No  Use of a cane, walker or w/c? No  Grab bars in the bathroom? No  Shower chair or bench in shower? No  Elevated toilet seat or a handicapped toilet? Yes   TIMED UP AND GO:  Was the test performed? Yes .  Length of time to ambulate 10 feet: 7 sec.   Gait steady and fast without use of assistive device  Cognitive Function: Normal cognitive status assessed by direct observation by this Nurse Health Advisor. No abnormalities found.   MMSE - Mini Mental State Exam 09/02/2016  Orientation to time 5  Orientation to Place 5  Registration 3  Attention/ Calculation 5  Recall 3  Language- name 2 objects 2  Language- repeat 1  Language- follow 3 step command 3  Language- read & follow direction 1  Write a sentence 1  Copy design 1  Total score 30     6CIT Screen 02/13/2020  What Year? 0 points  What month? 0 points  What time? 0 points  Count back from 20 0 points  Months in reverse 0 points  Repeat phrase 0 points  Total Score 0    Immunizations Immunization History  Administered Date(s) Administered   Fluad Quad(high Dose 65+) 05/07/2019   Influenza Split 05/11/2012   Influenza Whole 05/31/2007, 05/14/2008, 05/29/2010   Influenza, High Dose Seasonal PF 04/08/2016, 04/11/2017   Influenza,inj,Quad PF,6+ Mos 04/19/2013, 04/05/2014, 04/14/2015   Influenza-Unspecified 05/19/2018   PFIZER(Purple Top)SARS-COV-2 Vaccination 08/07/2019, 08/27/2019   Pneumococcal Conjugate-13 02/26/2014   Pneumococcal Polysaccharide-23 03/29/2011   Tdap 03/29/2011    TDAP status: Up to date  Flu Vaccine status: Up to date  Pneumococcal vaccine status: Up  to date  Covid-19 vaccine status: Completed vaccines  Qualifies for Shingles Vaccine? Yes   Zostavax completed No   Shingrix Completed?: No.    Education has been provided regarding the importance of this vaccine. Patient has been advised to call insurance company to determine out of pocket expense if they have not yet received this vaccine. Advised may also receive vaccine at local pharmacy or Health Dept. Verbalized acceptance and understanding.  Screening Tests Health Maintenance  Topic Date Due   Zoster Vaccines- Shingrix (1 of 2) Never done   COVID-19 Vaccine (3 - Booster for Pfizer series) 01/24/2020   INFLUENZA VACCINE  02/16/2021   TETANUS/TDAP  03/28/2021   DEXA SCAN  Completed   Hepatitis C Screening  Completed   PNA vac Low Risk Adult  Completed   HPV VACCINES  Aged Out    Health Maintenance  Health Maintenance Due  Topic Date Due   Zoster Vaccines- Shingrix (1 of 2) Never done   COVID-19 Vaccine (3 - Booster for Pfizer series) 01/24/2020    Colorectal cancer screening: No longer required.   Mammogram status: No longer required due to age.  Bone Density status: Completed 03/29/2011. Results reflect: Bone density results: OSTEOPENIA. Repeat every 5 years.  Lung Cancer Screening: (Low Dose CT Chest recommended if Age 48-80 years, 30 pack-year currently smoking OR have quit w/in 15years.) does not qualify.   Lung Cancer Screening Referral: n/a  Additional Screening:  Hepatitis C Screening: does not qualify; Completed 09/23/2015  Vision Screening: Recommended annual ophthalmology exams for early detection of glaucoma and other disorders of the eye. Is the patient up to date with their annual eye exam?  Yes  Who is the provider or what is the name of the office in which the  patient attends annual eye exams? Dr.Miller  If pt is not established with a provider, would they like to be referred to a provider to establish care? No .   Dental Screening: Recommended  annual dental exams for proper oral hygiene  Community Resource Referral / Chronic Care Management: CRR required this visit?  No   CCM required this visit?  No      Plan:     I have personally reviewed and noted the following in the patient's chart:   Medical and social history Use of alcohol, tobacco or illicit drugs  Current medications and supplements including opioid prescriptions. Patient is not currently taking opioid prescriptions. Functional ability and status Nutritional status Physical activity Advanced directives List of other physicians Hospitalizations, surgeries, and ER visits in previous 12 months Vitals Screenings to include cognitive, depression, and falls Referrals and appointments  In addition, I have reviewed and discussed with patient certain preventive protocols, quality metrics, and best practice recommendations. A written personalized care plan for preventive services as well as general preventive health recommendations were provided to patient.     March Rummage, LPN   [id number]   Nurse Notes: none

## 2021-02-16 ENCOUNTER — Other Ambulatory Visit: Payer: Self-pay

## 2021-02-17 ENCOUNTER — Ambulatory Visit: Payer: Medicare PPO | Admitting: Family Medicine

## 2021-02-17 ENCOUNTER — Encounter: Payer: Self-pay | Admitting: Family Medicine

## 2021-02-17 VITALS — BP 124/70 | HR 65 | Temp 98.5°F | Resp 16 | Ht 59.0 in | Wt 122.0 lb

## 2021-02-17 DIAGNOSIS — R079 Chest pain, unspecified: Secondary | ICD-10-CM | POA: Diagnosis not present

## 2021-02-17 DIAGNOSIS — M549 Dorsalgia, unspecified: Secondary | ICD-10-CM | POA: Diagnosis not present

## 2021-02-17 DIAGNOSIS — N8110 Cystocele, unspecified: Secondary | ICD-10-CM | POA: Diagnosis not present

## 2021-02-17 DIAGNOSIS — M159 Polyosteoarthritis, unspecified: Secondary | ICD-10-CM | POA: Diagnosis not present

## 2021-02-17 DIAGNOSIS — I10 Essential (primary) hypertension: Secondary | ICD-10-CM | POA: Diagnosis not present

## 2021-02-17 DIAGNOSIS — G8929 Other chronic pain: Secondary | ICD-10-CM

## 2021-02-17 DIAGNOSIS — E782 Mixed hyperlipidemia: Secondary | ICD-10-CM | POA: Diagnosis not present

## 2021-02-17 DIAGNOSIS — M1711 Unilateral primary osteoarthritis, right knee: Secondary | ICD-10-CM | POA: Diagnosis not present

## 2021-02-17 DIAGNOSIS — R233 Spontaneous ecchymoses: Secondary | ICD-10-CM

## 2021-02-17 DIAGNOSIS — R7303 Prediabetes: Secondary | ICD-10-CM

## 2021-02-17 NOTE — Progress Notes (Signed)
Chief Complaint  Patient presents with   Knee Pain    Right knee, over a year.   Neck Pain   Follow-up   HPI: Ms.Raneem E Loleta ChanceHill is a 75 y.o. female, who is here today complaining of knee pain and neck pain. Knee Pain  The incident occurred more than 1 week ago (Chronic). The pain is present in the right knee. The pain is at a severity of 5/10. The pain is moderate. The pain has been Intermittent since onset. Pertinent negatives include no loss of motion, loss of sensation, muscle weakness, numbness or tingling. She reports no foreign bodies present.  Neck Pain  This is a new problem. The current episode started 1 to 4 weeks ago (2-3 weeks). The pain is associated with nothing. Pain location: upper back, bilateral. The quality of the pain is described as aching. The pain is moderate. The symptoms are aggravated by position (palpation). Pertinent negatives include no fever, headaches, numbness, pain with swallowing, paresis, syncope, tingling, trouble swallowing, visual change or weakness. She has tried NSAIDs for the symptoms. The treatment provided no relief.   She wonders if chiropractor treatment would help. Pain is not radiated. No limitation of ROM. No hx of trauma or unusual physical activity.  Right knee and calf pain have been going on for about a year. Knee pain is exacerbated by turning when walking and walking stairs. Medial aspect. No associated edema or erythema. She is following with ortho. Knee pain mildly improved after intraarticular knee injection.  She has other joint pains,hands and hip mainly.  She has some questions about benefits of tumeric and collagen supplementation, both she is currently taking. She is on Meloxicam, 15 mg was prescribed but she is taking 1/2 tab daily, afraid of CV side effects.  Right calf pain is constant, she has not identified exacerbating or alleviating factors. Pain started about a year ago after kicking a planter pot. No  edema,erythema,numbness,or tingling. LE US negative for DVT, 09/2019.  Lab Results  Component Value Date   WBC 8.4 10/16/2019   HGB 13.6 10/16/2019   HCT 40.8 10/16/2019   MCV 91.5 10/16/2019   PLT 277 10/16/2019   Hypertension: She is asking if she needs to continue medications, she has been on same meds for may years. Medications:Metoprolol tartrate 25 mg bid,Lisinopril 10 mg daily,and HCTZ 25 mg daily. BP readings at home:She does not check. Side effects:None  Negative for unusual or severe headache, visual changes, exertional chest pain, dyspnea,  focal weakness, or edema.  1-2 weeks ago mid chest pain wakes her up, lasted a few minutes. No associated palpitations,diaphoresis,or SOB. She has had some heartburn, exacerbated by eating late.  Lab Results  Component Value Date   CREATININE 0.71 03/26/2020   BUN 21 03/26/2020   NA 140 03/26/2020   K 4.0 03/26/2020   CL 106 03/26/2020   CO2 26 03/26/2020   Appt with cardiologist in 03/2021.  Concerned about easy bruising, minimal trauma. Problem is mainly on forearms. She has not noted fever,night sweats,abnormal wt loss, nose/gum bleed,blood in stool,or melena.  Hyperlipidemia: Currently on Crestor 10 mg daily and Lovaza 2 g bid (her insurance does not cover Vascepa) Following a low fat diet: Yes. Side effects from medication:None Lab Results  Component Value Date   CHOL 120 03/26/2020   HDL 37 (L) 03/26/2020   LDLCALC 64 03/26/2020   LDLDIRECT 152.0 06/25/2019   TRIG 100 03/26/2020   CHOLHDL 3.2 03/26/2020   Prediabetes:Negative  for polydipsia,polyuria, or polyphagia.  Lab Results  Component Value Date   HGBA1C 5.5 03/26/2020   She thinks she may have a cystocele. Genital fullness,something coming out when she walks or when seated in the toilet. She already has an appt with gyn. Afraid of using the bathroom. She has not noted urinary symptoms,vaginal bleeding,or vaginal discharge.  She wonders about  complications and treatment options.  Review of Systems  Constitutional:  Negative for activity change, appetite change, fatigue and fever.  HENT:  Negative for mouth sores, sore throat and trouble swallowing.   Respiratory:  Negative for cough and wheezing.   Cardiovascular:  Negative for syncope.  Gastrointestinal:  Negative for abdominal pain, nausea and vomiting.       Negative for changes in bowel habits.  Endocrine: Negative for cold intolerance and heat intolerance.  Genitourinary:  Negative for decreased urine volume and dysuria.  Musculoskeletal:  Positive for neck pain.  Skin:  Negative for pallor and rash.  Neurological:  Negative for tingling, syncope, weakness, numbness and headaches.  Rest see pertinent positives and negatives per HPI.  Current Outpatient Medications on File Prior to Visit  Medication Sig Dispense Refill   Cholecalciferol (VITAMIN D3) 2000 units TABS Take 1 tablet by mouth daily.     COLLAGEN PO Take by mouth daily.     hydrochlorothiazide (HYDRODIURIL) 25 MG tablet TAKE ONE TABLET BY MOUTH ONE TIME DAILY 90 tablet 2   lisinopril (ZESTRIL) 10 MG tablet TAKE TWO TABLETS BY MOUTH DAILY 180 tablet 2   meloxicam (MOBIC) 15 MG tablet One tab PO qAM with a meal for 2 weeks, then daily prn pain. 30 tablet 3   metoprolol tartrate (LOPRESSOR) 25 MG tablet TAKE HALF TABLET BY MOUTH TWICE DAILY 90 tablet 2   omega-3 acid ethyl esters (LOVAZA) 1 g capsule TAKE ONE CAPSULE BY MOUTH TWICE DAILY 180 capsule 2   rosuvastatin (CRESTOR) 10 MG tablet TAKE ONE TABLET BY MOUTH ONE TIME DAILY 90 tablet 2   TURMERIC PO Take 1 capsule by mouth in the morning and at bedtime.     No current facility-administered medications on file prior to visit.   Past Medical History:  Diagnosis Date   Allergic rhinitis    Arthralgia 09/30/2015   Bronchitis, acute 01/09/2017   Chronic shoulder pain 12/07/2016   Epistaxis 09/30/2015   Globus sensation 02/23/2015   Heart murmur 12/07/2016   HTN  (hypertension)    Left hip pain 09/30/2015   Medicare annual wellness visit, subsequent 04/14/2015   Pain in the wrist, unspecified laterality 03/03/2014   Tachycardia    Episodic   Allergies  Allergen Reactions   Fenofibrate Other (See Comments)    Abdominal pain   Penicillins Rash    REACTION: Hives Anaphylaxis   Social History   Socioeconomic History   Marital status: Married    Spouse name: Not on file   Number of children: 1   Years of education: Not on file   Highest education level: Not on file  Occupational History   Occupation: Homemaker    Employer: RETIRED  Tobacco Use   Smoking status: Never   Smokeless tobacco: Never  Substance and Sexual Activity   Alcohol use: Yes    Comment:  occ   Drug use: No   Sexual activity: Yes    Comment: lives with husband  Other Topics Concern   Not on file  Social History Narrative   University - [place], Masters Degree - counseling, [employer]  counseling   Married '69 - 7 years, divorced; married '82   21 daughter - '72; 2 grandchildren      Regular Exercise -  YES         Social Determinants of Health   Financial Resource Strain: Low Risk    Difficulty of Paying Living Expenses: Not hard at all  Food Insecurity: No Food Insecurity   Worried About Programme researcher, broadcasting/film/video in the Last Year: Never true   Barista in the Last Year: Never true  Transportation Needs: No Transportation Needs   Lack of Transportation (Medical): No   Lack of Transportation (Non-Medical): No  Physical Activity: Insufficiently Active   Days of Exercise per Week: 2 days   Minutes of Exercise per Session: 30 min  Stress: No Stress Concern Present   Feeling of Stress : Not at all  Social Connections: Moderately Isolated   Frequency of Communication with Friends and Family: Three times a week   Frequency of Social Gatherings with Friends and Family: Three times a week   Attends Religious Services: Never   Active Member of Clubs or  Organizations: No   Attends Banker Meetings: Never   Marital Status: Married   Vitals:   02/17/21 1419  BP: 124/70  Pulse: 65  Resp: 16  Temp: 98.5 F (36.9 C)  SpO2: 96%   Body mass index is 24.64 kg/m.  Physical Exam Vitals and nursing note reviewed.  Constitutional:      General: She is not in acute distress.    Appearance: She is well-developed and normal weight.  HENT:     Head: Normocephalic and atraumatic.     Mouth/Throat:     Mouth: Mucous membranes are moist.     Pharynx: Oropharynx is clear.  Eyes:     Conjunctiva/sclera: Conjunctivae normal.  Cardiovascular:     Rate and Rhythm: Normal rate and regular rhythm.     Pulses:          Dorsalis pedis pulses are 2+ on the right side and 2+ on the left side.     Heart sounds: Murmur (SEM I/VI RUSB) heard.  Pulmonary:     Effort: Pulmonary effort is normal. No respiratory distress.     Breath sounds: Normal breath sounds.  Abdominal:     Palpations: Abdomen is soft. There is no hepatomegaly or mass.     Tenderness: There is no abdominal tenderness.  Genitourinary:    Comments: Differed to gyn. Musculoskeletal:     Cervical back: Tenderness present. No bony tenderness. Pain with movement present.       Back:     Right knee: Crepitus present. No bony tenderness. Normal range of motion. No tenderness.  Lymphadenopathy:     Cervical: No cervical adenopathy.  Skin:    General: Skin is warm.     Findings: No erythema or rash.  Neurological:     General: No focal deficit present.     Mental Status: She is alert and oriented to person, place, and time.     Cranial Nerves: No cranial nerve deficit.     Gait: Gait normal.  Psychiatric:     Comments: Well groomed, good eye contact.   ASSESSMENT AND PLAN:  Ms.Brindley was seen today for knee pain, neck pain and follow-up.  Diagnoses and all orders for this visit: Orders Placed This Encounter  Procedures   Hemoglobin A1c   Comprehensive metabolic  panel   Lipid panel  CBC   Lab Results  Component Value Date   WBC 7.0 02/18/2021   HGB 13.4 02/18/2021   HCT 39.2 02/18/2021   MCV 90.5 02/18/2021   PLT 251.0 02/18/2021   Lab Results  Component Value Date   CHOL 131 02/18/2021   HDL 32.40 (L) 02/18/2021   LDLCALC 72 02/18/2021   LDLDIRECT 152.0 06/25/2019   TRIG 132.0 02/18/2021   CHOLHDL 4 02/18/2021   Lab Results  Component Value Date   CREATININE 0.73 02/18/2021   BUN 27 (H) 02/18/2021   NA 140 02/18/2021   K 4.1 02/18/2021   CL 104 02/18/2021   CO2 27 02/18/2021   Lab Results  Component Value Date   ALT 17 02/18/2021   AST 20 02/18/2021   ALKPHOS 40 02/18/2021   BILITOT 0.5 02/18/2021   Lab Results  Component Value Date   HGBA1C 6.2 02/18/2021   The 10-year ASCVD risk score Denman George DC Jr., et al., 2013) is: 21%   Values used to calculate the score:     Age: 55 years     Sex: Female     Is Non-Hispanic African American: No     Diabetic: No     Tobacco smoker: No     Systolic Blood Pressure: 124 mmHg     Is BP treated: Yes     HDL Cholesterol: 32.4 mg/dL     Total Cholesterol: 131 mg/dL  Upper back pain Musculoskeletal. Pain. I do not think imaging is needed at this time. She may benefit from chiropractor evaluation and treatment.  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 We discussed clinical presentation of cystocele,possible complications and treatment options (surgical and non surgical). Kegel and pelvic floor exercises recommended for now. Keep appt with Gyn (Dr. Ellyn Hack).  Prediabetes Continue a healthy life style for diabetes prevention. Further recommendations according to HgA1C result.  Hyperlipidemia, mixed Continue Crestor 10 mg daily and Lovaza 1 g bid + low fat diet. Having FLP done tomorrow, further recommendations will be given according to results.  Essential hypertension BP adequately controlled. Continue current management: Metoprolol,Lisinopril,and HCTZ same  dose. DASH/low salt diet to continue. Recommend monitoring BP at home. Eye exam annually.    Senile ecchymosis Hx and examination today do not suggest a serious process. Reassured, we discussed Dx and prognosis.   Primary osteoarthritis of right knee Continue following with ortho. We discussed some side effects of NSAID's.  Right calf pain , which is also chronic, muscle injury/tear could have been the precipitating factor. She can apply icy hot or asper cream.  Generalized osteoarthritis of multiple sites Continue Tumeric with back pepper daily. If she feels like collagen supplementation is helping, she can continue. She is also on Meloxicam 15 mg 1/2 tab daily.   Chest pain, unspecified type One episode. We discussed possible etiologies. Instructed about warning signs. Keep appt with cardiologist.  I spent a total of 53 minutes in both face to face and non face to face activities for this visit on the date of this encounter. During this time history was obtained and documented, examination was performed, prior labs reviewed, and assessment/plan discussed.  Return in about 6 months (around 08/20/2021) for Labs tomorrow. .   Guled Gahan G. [name], MD  [facility]. [place] office.

## 2021-02-17 NOTE — Assessment & Plan Note (Signed)
BP adequately controlled. Continue current management: Metoprolol,Lisinopril,and HCTZ same dose. DASH/low salt diet to continue. Recommend monitoring BP at home. Eye exam annually.

## 2021-02-17 NOTE — Patient Instructions (Signed)
A few things to remember from today's visit:   Hyperlipidemia, mixed - Plan: Comprehensive metabolic panel, Lipid panel  Essential hypertension - Plan: Comprehensive metabolic panel, CBC  Prediabetes - Plan: Hemoglobin A1c  Senile ecchymosis  Upper back pain  Chronic pain of right knee  If you need refills please call your pharmacy. Do not use My Chart to request refills or for acute issues that need immediate attention.   No changes today. Monitor blood pressure 1-2 times per week.  Please be sure medication list is accurate. If a new problem present, please set up appointment sooner than planned today.

## 2021-02-17 NOTE — Assessment & Plan Note (Addendum)
Continue following with ortho. We discussed some side effects of NSAID's.  Right calf pain , which is also chronic, muscle injury/tear could have been the precipitating factor. She can apply icy hot or asper cream.

## 2021-02-17 NOTE — Assessment & Plan Note (Signed)
Continue Crestor 10 mg daily and Lovaza 1 g bid + low fat diet. Having FLP done tomorrow, further recommendations will be given according to results.

## 2021-02-17 NOTE — Assessment & Plan Note (Signed)
Hx and examination today do not suggest a serious process. Reassured, we discussed Dx and prognosis.

## 2021-02-17 NOTE — Assessment & Plan Note (Signed)
Continue Tumeric with back pepper daily. If she feels like collagen supplementation is helping, she can continue. She is also on Meloxicam 15 mg 1/2 tab daily.

## 2021-02-17 NOTE — Assessment & Plan Note (Signed)
Continue a healthy life style for diabetes prevention. Further recommendations according to HgA1C result. 

## 2021-02-18 ENCOUNTER — Other Ambulatory Visit: Payer: Self-pay

## 2021-02-18 ENCOUNTER — Other Ambulatory Visit (INDEPENDENT_AMBULATORY_CARE_PROVIDER_SITE_OTHER): Payer: Medicare PPO

## 2021-02-18 DIAGNOSIS — E782 Mixed hyperlipidemia: Secondary | ICD-10-CM | POA: Diagnosis not present

## 2021-02-18 DIAGNOSIS — I10 Essential (primary) hypertension: Secondary | ICD-10-CM | POA: Diagnosis not present

## 2021-02-18 DIAGNOSIS — R7303 Prediabetes: Secondary | ICD-10-CM

## 2021-02-18 LAB — COMPREHENSIVE METABOLIC PANEL
ALT: 17 U/L (ref 0–35)
AST: 20 U/L (ref 0–37)
Albumin: 4.5 g/dL (ref 3.5–5.2)
Alkaline Phosphatase: 40 U/L (ref 39–117)
BUN: 27 mg/dL — ABNORMAL HIGH (ref 6–23)
CO2: 27 mEq/L (ref 19–32)
Calcium: 9.8 mg/dL (ref 8.4–10.5)
Chloride: 104 mEq/L (ref 96–112)
Creatinine, Ser: 0.73 mg/dL (ref 0.40–1.20)
GFR: 79.59 mL/min (ref >60.00)
Glucose, Bld: 112 mg/dL — ABNORMAL HIGH (ref 70–99)
Potassium: 4.1 mEq/L (ref 3.5–5.1)
Sodium: 140 mEq/L (ref 135–145)
Total Bilirubin: 0.5 mg/dL (ref 0.2–1.2)
Total Protein: 6.9 g/dL (ref 6.0–8.3)

## 2021-02-18 LAB — CBC
HCT: 39.2 % (ref 36.0–46.0)
Hemoglobin: 13.4 g/dL (ref 12.0–15.0)
MCHC: 34.1 g/dL (ref 30.0–36.0)
MCV: 90.5 fl (ref 78.0–100.0)
Platelets: 251 10*3/uL (ref 150.0–400.0)
RBC: 4.33 Mil/uL (ref 3.87–5.11)
RDW: 12.6 % (ref 11.5–15.5)
WBC: 7 10*3/uL (ref 4.0–10.5)

## 2021-02-18 LAB — LIPID PANEL
Cholesterol: 131 mg/dL (ref 0–200)
HDL: 32.4 mg/dL — ABNORMAL LOW (ref >39.00)
LDL Cholesterol: 72 mg/dL (ref 0–99)
NonHDL: 98.76
Total CHOL/HDL Ratio: 4
Triglycerides: 132 mg/dL (ref 0.0–149.0)
VLDL: 26.4 mg/dL (ref 0.0–40.0)

## 2021-02-18 LAB — HEMOGLOBIN A1C: Hgb A1c MFr Bld: 6.2 % (ref 4.6–6.5)

## 2021-02-21 MED ORDER — OMEGA-3-ACID ETHYL ESTERS 1 G PO CAPS: 1.0000 | ORAL_CAPSULE | Freq: Two times a day (BID) | ORAL | 3 refills | Status: DC

## 2021-02-26 DIAGNOSIS — M9901 Segmental and somatic dysfunction of cervical region: Secondary | ICD-10-CM | POA: Diagnosis not present

## 2021-02-26 DIAGNOSIS — M5411 Radiculopathy, occipito-atlanto-axial region: Secondary | ICD-10-CM | POA: Diagnosis not present

## 2021-02-27 ENCOUNTER — Other Ambulatory Visit: Payer: Self-pay | Admitting: Family Medicine

## 2021-02-27 DIAGNOSIS — I10 Essential (primary) hypertension: Secondary | ICD-10-CM

## 2021-03-02 DIAGNOSIS — M5411 Radiculopathy, occipito-atlanto-axial region: Secondary | ICD-10-CM | POA: Diagnosis not present

## 2021-03-02 DIAGNOSIS — M9901 Segmental and somatic dysfunction of cervical region: Secondary | ICD-10-CM | POA: Diagnosis not present

## 2021-03-05 DIAGNOSIS — M9901 Segmental and somatic dysfunction of cervical region: Secondary | ICD-10-CM | POA: Diagnosis not present

## 2021-03-05 DIAGNOSIS — M5411 Radiculopathy, occipito-atlanto-axial region: Secondary | ICD-10-CM | POA: Diagnosis not present

## 2021-03-09 DIAGNOSIS — M5411 Radiculopathy, occipito-atlanto-axial region: Secondary | ICD-10-CM | POA: Diagnosis not present

## 2021-03-09 DIAGNOSIS — M9901 Segmental and somatic dysfunction of cervical region: Secondary | ICD-10-CM | POA: Diagnosis not present

## 2021-03-12 DIAGNOSIS — M9901 Segmental and somatic dysfunction of cervical region: Secondary | ICD-10-CM | POA: Diagnosis not present

## 2021-03-12 DIAGNOSIS — M5411 Radiculopathy, occipito-atlanto-axial region: Secondary | ICD-10-CM | POA: Diagnosis not present

## 2021-03-16 DIAGNOSIS — N8189 Other female genital prolapse: Secondary | ICD-10-CM | POA: Diagnosis not present

## 2021-03-16 DIAGNOSIS — Z01419 Encounter for gynecological examination (general) (routine) without abnormal findings: Secondary | ICD-10-CM | POA: Diagnosis not present

## 2021-03-16 DIAGNOSIS — Z01411 Encounter for gynecological examination (general) (routine) with abnormal findings: Secondary | ICD-10-CM | POA: Diagnosis not present

## 2021-03-16 DIAGNOSIS — M9901 Segmental and somatic dysfunction of cervical region: Secondary | ICD-10-CM | POA: Diagnosis not present

## 2021-03-16 DIAGNOSIS — M5411 Radiculopathy, occipito-atlanto-axial region: Secondary | ICD-10-CM | POA: Diagnosis not present

## 2021-03-19 DIAGNOSIS — M9901 Segmental and somatic dysfunction of cervical region: Secondary | ICD-10-CM | POA: Diagnosis not present

## 2021-03-19 DIAGNOSIS — M5411 Radiculopathy, occipito-atlanto-axial region: Secondary | ICD-10-CM | POA: Diagnosis not present

## 2021-03-20 ENCOUNTER — Telehealth: Payer: Self-pay | Admitting: Family Medicine

## 2021-03-20 DIAGNOSIS — I739 Peripheral vascular disease, unspecified: Secondary | ICD-10-CM | POA: Diagnosis not present

## 2021-03-20 DIAGNOSIS — Z880 Allergy status to penicillin: Secondary | ICD-10-CM | POA: Diagnosis not present

## 2021-03-20 DIAGNOSIS — Z8249 Family history of ischemic heart disease and other diseases of the circulatory system: Secondary | ICD-10-CM | POA: Diagnosis not present

## 2021-03-20 DIAGNOSIS — I10 Essential (primary) hypertension: Secondary | ICD-10-CM | POA: Diagnosis not present

## 2021-03-20 DIAGNOSIS — Z809 Family history of malignant neoplasm, unspecified: Secondary | ICD-10-CM | POA: Diagnosis not present

## 2021-03-20 DIAGNOSIS — G8929 Other chronic pain: Secondary | ICD-10-CM | POA: Diagnosis not present

## 2021-03-20 DIAGNOSIS — Z833 Family history of diabetes mellitus: Secondary | ICD-10-CM | POA: Diagnosis not present

## 2021-03-20 DIAGNOSIS — Z791 Long term (current) use of non-steroidal anti-inflammatories (NSAID): Secondary | ICD-10-CM | POA: Diagnosis not present

## 2021-03-20 DIAGNOSIS — E785 Hyperlipidemia, unspecified: Secondary | ICD-10-CM | POA: Diagnosis not present

## 2021-03-20 NOTE — Telephone Encounter (Signed)
Eileen Hansen and Jeffy (Nurse Practitioner) called to discuss significant finding for the patient.   You can give Jeffy a call back at [phone].  Please advise.

## 2021-03-20 NOTE — Telephone Encounter (Signed)
Patient has an appointment on Tuesday.  °

## 2021-03-24 ENCOUNTER — Encounter: Payer: Self-pay | Admitting: Family Medicine

## 2021-03-24 ENCOUNTER — Ambulatory Visit: Payer: Medicare PPO | Admitting: Family Medicine

## 2021-03-24 ENCOUNTER — Other Ambulatory Visit: Payer: Self-pay

## 2021-03-24 VITALS — BP 128/80 | HR 66 | Resp 16 | Ht 59.0 in | Wt 119.4 lb

## 2021-03-24 DIAGNOSIS — I739 Peripheral vascular disease, unspecified: Secondary | ICD-10-CM

## 2021-03-24 DIAGNOSIS — E782 Mixed hyperlipidemia: Secondary | ICD-10-CM

## 2021-03-24 DIAGNOSIS — M79661 Pain in right lower leg: Secondary | ICD-10-CM | POA: Diagnosis not present

## 2021-03-24 DIAGNOSIS — M542 Cervicalgia: Secondary | ICD-10-CM

## 2021-03-24 DIAGNOSIS — I10 Essential (primary) hypertension: Secondary | ICD-10-CM | POA: Diagnosis not present

## 2021-03-24 NOTE — Assessment & Plan Note (Addendum)
We reviewed last FLP result. Continue Crestor 10 mg daily and Lovaza 1 g bid + low fat diet. If PAD is confirmed, we will need to set new LDL goals.

## 2021-03-24 NOTE — Assessment & Plan Note (Signed)
Chronic and constant. It seems more musculoskeletal etiology. Hx does not suggest claudication but PAD needs to be considered.

## 2021-03-24 NOTE — Progress Notes (Signed)
Chief Complaint  Patient presents with   Follow-up    Abnormal PAD screening at home.   HPI: [name] is a 75 y.o. female, who is here today concerned about abnormal PAD screening done at home through her head insurance, home visit. She was recently seen in the office, 02/17/21.  Screening was performed on 03/20/2021: Left lower extremity 1.04 and right lower extremity 0.74.  For over a year she has had right calf pain, she has follow-up with orthopedics. Right calf pain is constant, she has not identified exacerbating or alleviating factors.  Sometimes pain interferes with his sleep. LE [place] negative for DVT, 09/2019.  Intermittent right knee pain, exacerbated by turning to either side while walking. Negative for numbness, tingling, burning, or needle sensation. Both problems have been stable. She takes meloxicam 15 mg 1/2 tablet daily as needed.  She is still having cervical pain, wonders if this could be caused by PAD. Pain is not radiated.  Negative for upper extremity numbness, tingling, or burning sensation.  No limitation of range of motion. She is following with chiropractor, which has helped. US carotid Doppler in 10/2014:Minimal plaque in the carotid arteries.  No significant carotid artery stenosis.  During home visit on 03/30/2021 BP was 102/46. She is not checking BP at home. Currently she is on metoprolol tartrate 25 mg 1/2 tablet twice daily, lisinopril 10 mg daily, and HCTZ 25 mg daily. Negative for severe/frequent headache, visual changes, chest pain, dyspnea, palpitation,focal weakness, or edema.  Lab Results  Component Value Date   CREATININE 0.73 02/18/2021   BUN 27 (H) 02/18/2021   NA 140 02/18/2021   K 4.1 02/18/2021   CL 104 02/18/2021   CO2 27 02/18/2021   HLD on Crestor 10 mg daily and Lovaza 1 g twice daily. She acknowledges that she needs to improve low-fat diet, she loves cheese. She has tolerated medication well.  Lab Results  Component  Value Date   CHOL 131 02/18/2021   HDL 32.40 (L) 02/18/2021   LDLCALC 72 02/18/2021   LDLDIRECT 152.0 06/25/2019   TRIG 132.0 02/18/2021   CHOLHDL 4 02/18/2021   Since her last visit,she has seen gyn and referral to uro-gynecologist to discuss treatment options, including pessary vs surgery. Symptoms are stable. She is doing Kegel and pelvic floor exercises.  Review of Systems  Constitutional:  Negative for activity change, appetite change, fatigue and fever.  HENT:  Negative for mouth sores, nosebleeds and sore throat.   Respiratory:  Negative for cough and wheezing.   Gastrointestinal:  Negative for abdominal pain, nausea and vomiting.       Negative for changes in bowel habits.  Genitourinary:  Negative for decreased urine volume and hematuria.  Musculoskeletal:  Positive for arthralgias and myalgias. Negative for gait problem.  Skin:  Negative for pallor and rash.  Neurological:  Negative for dizziness, syncope, facial asymmetry and weakness.  Psychiatric/Behavioral:  Negative for confusion.   Rest see pertinent positives and negatives per HPI.  Current Outpatient Medications on File Prior to Visit  Medication Sig Dispense Refill   Cholecalciferol (VITAMIN D3) 2000 units TABS Take 1 tablet by mouth daily.     COLLAGEN PO Take by mouth daily.     hydrochlorothiazide (HYDRODIURIL) 25 MG tablet TAKE ONE TABLET BY MOUTH ONE TIME DAILY 90 tablet 2   lisinopril (ZESTRIL) 10 MG tablet TAKE TWO TABLETS BY MOUTH DAILY 180 tablet 2   meloxicam (MOBIC) 15 MG tablet One tab PO qAM with  a meal for 2 weeks, then daily prn pain. 30 tablet 3   metoprolol tartrate (LOPRESSOR) 25 MG tablet TAKE HALF TABLET BY MOUTH TWICE DAILY 90 tablet 2   omega-3 acid ethyl esters (LOVAZA) 1 g capsule Take 1 capsule (1 g total) by mouth 2 (two) times daily. 180 capsule 3   rosuvastatin (CRESTOR) 10 MG tablet TAKE ONE TABLET BY MOUTH ONE TIME DAILY 90 tablet 2   TURMERIC PO Take 1 capsule by mouth in the  morning and at bedtime.     No current facility-administered medications on file prior to visit.   Past Medical History:  Diagnosis Date   Allergic rhinitis    Arthralgia 09/30/2015   Bronchitis, acute 01/09/2017   Chronic shoulder pain 12/07/2016   Epistaxis 09/30/2015   Globus sensation 02/23/2015   Heart murmur 12/07/2016   HTN (hypertension)    Left hip pain 09/30/2015   Medicare annual wellness visit, subsequent 04/14/2015   Pain in the wrist, unspecified laterality 03/03/2014   Tachycardia    Episodic   Allergies  Allergen Reactions   Fenofibrate Other (See Comments)    Abdominal pain   Penicillins Rash    REACTION: Hives Anaphylaxis   Social History   Socioeconomic History   Marital status: Married    Spouse name: Not on file   Number of children: 1   Years of education: Not on file   Highest education level: Not on file  Occupational History   Occupation: Homemaker    Employer: RETIRED  Tobacco Use   Smoking status: Never   Smokeless tobacco: Never  Substance and Sexual Activity   Alcohol use: Yes    Comment:  occ   Drug use: No   Sexual activity: Yes    Comment: lives with husband  Other Topics Concern   Not on file  Social History Narrative   University - [place], Masters Degree - counseling, [employer]   Married '69 - 7 years, divorced; married '82   21 daughter - '72; 2 grandchildren      Regular Exercise -  YES         Social Determinants of Health   Financial Resource Strain: Low Risk    Difficulty of Paying Living Expenses: Not hard at all  Food Insecurity: No Food Insecurity   Worried About Programme researcher, broadcasting/film/video in the Last Year: Never true   Ran Out of Food in the Last Year: Never true  Transportation Needs: No Transportation Needs   Lack of Transportation (Medical): No   Lack of Transportation (Non-Medical): No  Physical Activity: Insufficiently Active   Days of Exercise per Week: 2 days   Minutes of Exercise per Session: 30  min  Stress: No Stress Concern Present   Feeling of Stress : Not at all  Social Connections: Moderately Isolated   Frequency of Communication with Friends and Family: Three times a week   Frequency of Social Gatherings with Friends and Family: Three times a week   Attends Religious Services: Never   Active Member of Clubs or Organizations: No   Attends Banker Meetings: Never   Marital Status: Married   Vitals:   03/24/21 1027  BP: 128/80  Pulse: 66  Resp: 16  SpO2: 97%   Body mass index is 24.11 kg/m.  Physical Exam Vitals and nursing note reviewed.  Constitutional:      General: She is not in acute distress.    Appearance: She is well-developed.  HENT:  Head: Normocephalic and atraumatic.  Eyes:     Conjunctiva/sclera: Conjunctivae normal.  Neck:     Vascular: Carotid bruit (? Right side) present.  Cardiovascular:     Rate and Rhythm: Normal rate and regular rhythm.     Pulses:          Dorsalis pedis pulses are 2+ on the right side and 2+ on the left side.       Posterior tibial pulses are 2+ on the right side and 2+ on the left side.     Heart sounds: Murmur (SEM I/VI RUSB) heard.  Pulmonary:     Effort: Pulmonary effort is normal. No respiratory distress.     Breath sounds: Normal breath sounds.  Abdominal:     Palpations: Abdomen is soft. There is no hepatomegaly or mass.     Tenderness: There is no abdominal tenderness.  Musculoskeletal:     Cervical back: No bony tenderness. Normal range of motion.     Right lower leg: Tenderness present. No deformity or bony tenderness.       Legs:  Lymphadenopathy:     Cervical: No cervical adenopathy.  Skin:    General: Skin is warm.     Findings: No erythema or rash.  Neurological:     General: No focal deficit present.     Mental Status: She is alert and oriented to person, place, and time.     Cranial Nerves: No cranial nerve deficit.     Gait: Gait normal.  Psychiatric:     Comments: Well  groomed, good eye contact.   ASSESSMENT AND PLAN:  [name] was seen today for follow-up.  Diagnoses and all orders for this visit: Orders Placed This Encounter  Procedures   VAS [place] ABI WITH/WO TBI   VAS US CAROTID   PAD (peripheral artery disease) (HCC) We discussed current recommendations in regard to screening. ABI will be arranged and further recommendation will be given accordingly.  Cervicalgia I do not think imaging is needed at this time. Continue following with chiropractor.  Hyperlipidemia, mixed We reviewed last FLP result. Continue Crestor 10 mg daily and Lovaza 1 g bid + low fat diet. If PAD is confirmed, we will need to set new LDL goals.  Essential hypertension BP mildly low when checked during home visit. For now continue Metoprolol tartrate 25 mg 1/2 tab bid,Lisinopril 10 mg daily,and HCTZ 25 mg daily. Monitor BP at home and continue low salt diet.  Right calf pain Chronic and constant. It seems more musculoskeletal etiology. Hx does not suggest claudication but PAD needs to be considered.  I spent a total of 45 minutes in both face to face and non face to face activities for this visit on the date of this encounter. During this time history was obtained and documented, examination was performed, prior labs/imaging reviewed, and assessment/plan discussed.  Return if symptoms worsen or fail to improve, for Keep next appt..   Eileen Hansen G. [name], MD  [facility]. [place] office.

## 2021-03-24 NOTE — Patient Instructions (Addendum)
A few things to remember from today's visit:  PAD (peripheral artery disease) (HCC) - Plan: VAS [place] ABI WITH/WO TBI, VAS US CAROTID  Right calf pain - Plan: VAS [place] ABI WITH/WO TBI  Essential hypertension  If you need refills please call your pharmacy. Do not use My Chart to request refills or for acute issues that need immediate attention.   Continue Crestor. Tomese la presion arterial en la case.  No cambios hoy.  Please be sure medication list is accurate. If a new problem present, please set up appointment sooner than planned today.

## 2021-03-24 NOTE — Assessment & Plan Note (Signed)
BP mildly low when checked during home visit. For now continue Metoprolol tartrate 25 mg 1/2 tab bid,Lisinopril 10 mg daily,and HCTZ 25 mg daily. Monitor BP at home and continue low salt diet.

## 2021-03-25 DIAGNOSIS — M9901 Segmental and somatic dysfunction of cervical region: Secondary | ICD-10-CM | POA: Diagnosis not present

## 2021-03-25 DIAGNOSIS — M5411 Radiculopathy, occipito-atlanto-axial region: Secondary | ICD-10-CM | POA: Diagnosis not present

## 2021-03-27 ENCOUNTER — Ambulatory Visit (INDEPENDENT_AMBULATORY_CARE_PROVIDER_SITE_OTHER)
Admission: RE | Admit: 2021-03-27 | Discharge: 2021-03-27 | Disposition: A | Discharge status: Home / Self Care | Payer: Medicare PPO | Source: Ambulatory Visit | Attending: Family Medicine | Admitting: Family Medicine

## 2021-03-27 ENCOUNTER — Other Ambulatory Visit: Payer: Self-pay

## 2021-03-27 ENCOUNTER — Ambulatory Visit (HOSPITAL_COMMUNITY)
Admission: RE | Admit: 2021-03-27 | Discharge: 2021-03-27 | Disposition: A | Discharge status: Home / Self Care | Payer: Medicare PPO | Source: Ambulatory Visit | Attending: Family Medicine | Admitting: Family Medicine

## 2021-03-27 DIAGNOSIS — I739 Peripheral vascular disease, unspecified: Secondary | ICD-10-CM

## 2021-03-27 DIAGNOSIS — M79661 Pain in right lower leg: Secondary | ICD-10-CM | POA: Insufficient documentation

## 2021-04-06 ENCOUNTER — Ambulatory Visit (HOSPITAL_COMMUNITY): Payer: Medicare PPO | Attending: Cardiology

## 2021-04-06 ENCOUNTER — Other Ambulatory Visit: Payer: Self-pay

## 2021-04-06 DIAGNOSIS — I351 Nonrheumatic aortic (valve) insufficiency: Secondary | ICD-10-CM | POA: Diagnosis not present

## 2021-04-06 LAB — ECHOCARDIOGRAM COMPLETE
Area-P 1/2: 2.54 cm2
P 1/2 time: 653 msec
S' Lateral: 2.4 cm

## 2021-04-18 DIAGNOSIS — Z8616 Personal history of COVID-19: Secondary | ICD-10-CM

## 2021-04-18 HISTORY — DX: Personal history of COVID-19: Z86.16

## 2021-04-20 ENCOUNTER — Other Ambulatory Visit: Payer: Self-pay

## 2021-04-20 MED ORDER — LISINOPRIL 10 MG PO TABS: 20.0000 mg | ORAL_TABLET | Freq: Every day | ORAL | 2 refills | Status: DC

## 2021-04-27 NOTE — Progress Notes (Signed)
Cardiology Office Note:    Date:  04/28/2021   ID:  Eileen Hansen, DOB [birth year], MRN [id number]  PCP:  [name], [name], MD   [facility] Providers Cardiologist:  Meriam Sprague, MD     Referring MD: [name], [name], MD   Chief Complaint:  F/u for mild MR, AI;  PAD    Patient Profile:   Eileen Hansen is a 75 y.o. 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 Left Carotid: Velocities in the left ICA are consistent with a 1-39% stenosis.  ABIs 03/27/2021 Right 0.81; left 0.91   History of Present Illness: [name] established with Dr. Shari Prows in 9/21 for cardiac murmur.  She had a hx of mild MR, mild AI noted on prior echocardiogram.  F/u echocardiogram done last month demonstrated just trivial AI, MR.  She returns for f/u.  She is here alone.  She just had ABIs with her primary care physician.  These are mildly abnormal.  She notes a long history of right calf claudication.  Symptoms wake her up at night sometimes.  She has not had any sores on her feet or nonhealing wounds.  She has started having left leg symptoms as well.  She would like further evaluation with [facility] cardiology or vascular surgery.  She has not had chest pain or significant shortness of breath.  Her leg pain limits her activities.  She has been much less active over the past year.  She does note interscapular back pain over the past few weeks.  This seems to come on with exertion and relieved with rest.  She has not had syncope, orthopnea.  She has not had leg edema.        Past Medical History:   Diagnosis Date   Allergic rhinitis    Arthralgia 09/30/2015   Bronchitis, acute 01/09/2017   Chronic shoulder pain 12/07/2016   Epistaxis 09/30/2015   Globus sensation 02/23/2015   Heart murmur 12/07/2016   HTN (hypertension)    Left hip pain 09/30/2015   Medicare annual wellness visit, subsequent 04/14/2015   Pain in the wrist, unspecified laterality 03/03/2014   Tachycardia    Episodic   Current Medications: Current Meds  Medication Sig   aspirin EC 81 MG tablet Take 81 mg by mouth daily. Swallow whole.   COLLAGEN PO Take by mouth daily.   hydrochlorothiazide (HYDRODIURIL) 25 MG tablet TAKE ONE TABLET BY MOUTH ONE TIME DAILY   lisinopril (ZESTRIL) 10 MG tablet Take 2 tablets (20 mg total) by mouth daily.   metoprolol tartrate (LOPRESSOR) 25 MG tablet TAKE HALF TABLET BY MOUTH TWICE DAILY   omega-3 acid ethyl esters (LOVAZA) 1 g capsule Take 1 capsule (1 g total) by mouth 2 (two) times daily.   TURMERIC PO Take 1 capsule by mouth in the morning and at bedtime.   [DISCONTINUED] rosuvastatin (CRESTOR) 10 MG tablet TAKE ONE TABLET BY MOUTH ONE TIME DAILY    Allergies:   Fenofibrate and Penicillins   Social History   Tobacco Use   Smoking status: Never   Smokeless tobacco:  Never  Substance Use Topics   Alcohol use: Yes    Comment:  occ   Drug use: No    Family Hx: The patient's family history includes Cancer in her brother and father; Diabetes in her mother; Heart attack in her brother and mother; Hyperlipidemia in her mother; Hypertension in her mother; Stroke in her mother. There is no history of Colon cancer, Breast cancer, or Coronary artery disease.  Review of Systems  Cardiovascular:  Positive for claudication.  Gastrointestinal:  Negative for hematochezia and melena.  Genitourinary:  Negative for hematuria.    EKGs/Labs/Other Test Reviewed:    EKG:  EKG is   ordered today.  The ekg ordered today demonstrates sinus bradycardia, HR 59, normal axis, no ST-T wave changes, QTC  397  Recent Labs: 02/18/2021: ALT 17; BUN 27; Creatinine, Ser 0.73; Hemoglobin 13.4; Platelets 251.0; Potassium 4.1; Sodium 140   Recent Lipid Panel Lab Results  Component Value Date/Time   CHOL 131 02/18/2021 10:05 AM   TRIG 132.0 02/18/2021 10:05 AM   HDL 32.40 (L) 02/18/2021 10:05 AM   LDLCALC 72 02/18/2021 10:05 AM   LDLCALC 64 03/26/2020 12:59 PM   LDLDIRECT 152.0 06/25/2019 03:36 PM     Risk Assessment/Calculations:          Physical Exam:    VS:  BP (!) 146/68 (BP Location: Left Arm, Patient Position: Sitting, Cuff Size: Normal)   Pulse (!) 59   Ht 4\' 11"  (1.499 m)   Wt 118 lb 12.8 oz (53.9 kg)   BMI 23.99 kg/m     Wt Readings from Last 3 Encounters:  04/28/21 118 lb 12.8 oz (53.9 kg)  03/24/21 119 lb 6 oz (54.1 kg)  02/17/21 122 lb (55.3 kg)    Constitutional:      Appearance: Healthy appearance. Not in distress.  Neck:     Vascular: JVD normal.  Pulmonary:     Effort: Pulmonary effort is normal.     Breath sounds: No wheezing. No rales.  Cardiovascular:     Normal rate. Regular rhythm. Normal S1. Normal S2.      Murmurs: There is no murmur.  Pulses:    Comments: Palpable but diminished DP/PT bilat Edema:    Peripheral edema absent.  Abdominal:     Palpations: Abdomen is soft. There is no hepatomegaly.  Skin:    General: Skin is warm and dry.  Neurological:     Mental Status: Alert and oriented to person, place and time.     Cranial Nerves: Cranial nerves are intact.       ASSESSMENT & PLAN:   1. PAD (peripheral artery disease) (HCC) Her ABIs are mildly abnormal.  However, she has had significant right calf claudication for a year now.  She is starting to have symptoms in her left leg as well.  I will set her up for lower extremity arterial ultrasound and refer her to [facility] cardiology for further evaluation.  Continue aspirin 81 mg daily.  Increase rosuvastatin to 20 mg daily.  2. Angina pectoris (HCC) She notes interscapular back pain that seems to  come on with exertion and resolve with rest.  Her electrocardiogram does not demonstrate any ischemic changes.  I am concerned that her back pain is an anginal equivalent.  I will arrange a Lexiscan Myoview to further evaluate.  If this is significantly abnormal, we will to consider cardiac catheterization.  3. Aortic valve insufficiency, etiology of cardiac valve disease unspecified 4. Mild mitral regurgitation Trivial AI  and trivial MR most recent echocardiogram.  She had seen a dentist recently and noted that she would need some sort of cardiac clearance.  As far as her valve issues go, she does not require SBE prophylaxis.  5. Essential hypertension Blood pressure somewhat borderline.  Have asked her to monitor blood pressure of 70s readings for review if via MyChart.  Goal blood pressure <130/80.  Continue HCTZ 25 mg daily, lisinopril 20 mg daily, metoprolol tartrate 12.5 mg twice daily.  6. Pure hypercholesterolemia Recent LDL 72.  I think her goal LDL should be less than 70.  Increase rosuvastatin to 20 mg daily and obtain follow-up with fasting lipids and LFTs in 3 months.    Shared Decision Making/Informed Consent The risks [chest pain, shortness of breath, cardiac arrhythmias, dizziness, blood pressure fluctuations, myocardial infarction, stroke/transient ischemic attack, nausea, vomiting, allergic reaction, radiation exposure, metallic taste sensation and life-threatening complications (estimated to be 1 in 10,000)], benefits (risk stratification, diagnosing coronary artery disease, treatment guidance) and alternatives of a nuclear stress test were discussed in detail with Ms. Minniefield and she agrees to proceed.    Dispo:  Return in about 3 months (around 07/29/2021) for Routine Follow Up, w/ Dr. Shari Prows, or Tereso Newcomer, PA-C.   Medication Adjustments/Labs and Tests Ordered: Current medicines are reviewed at length with the patient today.  Concerns regarding medicines are outlined above.   Tests Ordered: Orders Placed This Encounter  Procedures   Hepatic function panel   Lipid Profile   Ambulatory Referral for Peripheral Vascular Consult   Cardiac Stress Test: Informed Consent Details: Physician/Practitioner Attestation; Transcribe to consent form and obtain patient signature   Myocardial Perfusion Imaging   EKG 12-Lead   VAS [place] LOWER EXTREMITY ARTERIAL DUPLEX   Medication Changes: Meds ordered this encounter  Medications   rosuvastatin (CRESTOR) 20 MG tablet    Sig: Take 1 tablet (20 mg total) by mouth daily.    Dispense:  90 tablet    Refill:  3   Signed, Tereso Newcomer, PA-C  04/28/2021 10:33 AM    [facility] [address] [place], [place], [place]  [address] Phone: [phone]; Fax: [phone]

## 2021-04-28 ENCOUNTER — Ambulatory Visit: Payer: Medicare PPO | Admitting: Physician Assistant

## 2021-04-28 ENCOUNTER — Other Ambulatory Visit: Payer: Self-pay

## 2021-04-28 ENCOUNTER — Encounter: Payer: Self-pay | Admitting: Physician Assistant

## 2021-04-28 VITALS — BP 146/68 | HR 59 | Ht 59.0 in | Wt 118.8 lb

## 2021-04-28 DIAGNOSIS — I10 Essential (primary) hypertension: Secondary | ICD-10-CM

## 2021-04-28 DIAGNOSIS — I351 Nonrheumatic aortic (valve) insufficiency: Secondary | ICD-10-CM

## 2021-04-28 DIAGNOSIS — I340 Nonrheumatic mitral (valve) insufficiency: Secondary | ICD-10-CM

## 2021-04-28 DIAGNOSIS — E7800 Pure hypercholesterolemia, unspecified: Secondary | ICD-10-CM

## 2021-04-28 DIAGNOSIS — I739 Peripheral vascular disease, unspecified: Secondary | ICD-10-CM

## 2021-04-28 DIAGNOSIS — I209 Angina pectoris, unspecified: Secondary | ICD-10-CM | POA: Diagnosis not present

## 2021-04-28 MED ORDER — ROSUVASTATIN CALCIUM 20 MG PO TABS: 20.0000 mg | ORAL_TABLET | Freq: Every day | ORAL | 3 refills | Status: DC

## 2021-04-28 NOTE — Patient Instructions (Signed)
Medication Instructions:   INCREASE Rosuvastatin one tablet by mouth ( 20 mg) daily.    *If you need a refill on your cardiac medications before your next appointment, please call your pharmacy*   Lab Work: Your physician recommends that you return for a FASTING lipid profile/lft fasting from midnight the night before, same day as Dr. Shari Prows appt.    If you have labs (blood work) drawn today and your tests are completely normal, you will receive your results only by: MyChart Message (if you have MyChart) OR A paper copy in the mail If you have any lab test that is abnormal or we need to change your treatment, we will call you to review the results.   Testing/Procedures: You are scheduled for a Myocardial Perfusion Imaging Study on Thursday, October 13 at 8:15 am.   Please arrive 15 minutes prior to your appointment time for registration and insurance purposes.   The test will take approximately 3 to 4 hours to complete; you may bring reading material. If someone comes with you to your appointment, they will need to remain in the main lobby due to limited space in the testing area.   How to prepare for your Myocardial Perfusion test:   Do not eat or drink 3 hours prior to your test, except you may have water.    Do not consume products containing caffeine (regular or decaffeinated) 12 hours prior to your test (ex: coffee, chocolate, soda, tea)   Do bring a list of your current medications with you. If not listed below, you may take your medications as normal.   Bring any held medication to your appointment, as you may be required to take it once the test is complete.   Do wear comfortable clothes (no dresses ) and walking shoes. Tennis shoes are preferred. No heels or open toed shoes.  Do not wear  perfume, or lotions (deodorant is allowed).   If these instructions are not followed, you test will have to be rescheduled.   Please report to [address]  for your test. If you have questions or concerns about your appointment, please call the Nuclear Lab at #[phone].  If you cannot keep your appointment, please provide 24 hour notification to the Nuclear lab to avoid a possible $50 charge to your account.   Your physician has requested that you have a lower extremity arterial exercise duplex. During this test, exercise and ultrasound are used to evaluate arterial blood flow in the legs. Allow one hour for this exam. There are no restrictions or special instructions.      Follow-Up: At [facility], you and your health needs are our priority.  As part of our continuing mission to provide you with exceptional heart care, we have created designated Provider Care Teams.  These Care Teams include your primary Cardiologist (physician) and Advanced Practice Providers (APPs -  Physician Assistants and Nurse Practitioners) who all work together to provide you with the care you need, when you need it.  We recommend signing up for the patient portal called "MyChart".  Sign up information is provided on this After Visit Summary.  MyChart is used to connect with patients for Virtual Visits (Telemedicine).  Patients are able to view lab/test results, encounter notes, upcoming appointments, etc.  Non-urgent messages can be sent to your provider as well.   To learn more about what you can do with MyChart, go to [email or web address].    Your next appointment:  3 month(s)  The format for your next appointment:   In Person  Provider:   Laurance Flatten, MD   Other Instructions You have been referred to Dr. Kirke Corin for PAD.

## 2021-04-29 ENCOUNTER — Other Ambulatory Visit: Payer: Self-pay | Admitting: Physician Assistant

## 2021-04-29 ENCOUNTER — Ambulatory Visit (HOSPITAL_COMMUNITY)
Admission: RE | Admit: 2021-04-29 | Discharge: 2021-04-29 | Disposition: A | Discharge status: Home / Self Care | Payer: Medicare PPO | Source: Ambulatory Visit | Attending: Cardiology | Admitting: Cardiology

## 2021-04-29 ENCOUNTER — Telehealth (HOSPITAL_COMMUNITY): Payer: Self-pay

## 2021-04-29 DIAGNOSIS — E7800 Pure hypercholesterolemia, unspecified: Secondary | ICD-10-CM | POA: Diagnosis not present

## 2021-04-29 DIAGNOSIS — I340 Nonrheumatic mitral (valve) insufficiency: Secondary | ICD-10-CM

## 2021-04-29 DIAGNOSIS — I351 Nonrheumatic aortic (valve) insufficiency: Secondary | ICD-10-CM

## 2021-04-29 DIAGNOSIS — I209 Angina pectoris, unspecified: Secondary | ICD-10-CM | POA: Diagnosis not present

## 2021-04-29 DIAGNOSIS — I739 Peripheral vascular disease, unspecified: Secondary | ICD-10-CM | POA: Insufficient documentation

## 2021-04-29 DIAGNOSIS — I10 Essential (primary) hypertension: Secondary | ICD-10-CM | POA: Insufficient documentation

## 2021-04-29 NOTE — Telephone Encounter (Signed)
Spoke with the patient, detailed instructions given. She stated that she would be here for her test. Asked to call back with any questions. S.Aiden Rao EMTP 

## 2021-04-30 ENCOUNTER — Other Ambulatory Visit: Payer: Self-pay

## 2021-04-30 ENCOUNTER — Ambulatory Visit (HOSPITAL_BASED_OUTPATIENT_CLINIC_OR_DEPARTMENT_OTHER): Payer: Medicare PPO

## 2021-04-30 DIAGNOSIS — E7800 Pure hypercholesterolemia, unspecified: Secondary | ICD-10-CM

## 2021-04-30 DIAGNOSIS — I351 Nonrheumatic aortic (valve) insufficiency: Secondary | ICD-10-CM

## 2021-04-30 DIAGNOSIS — I739 Peripheral vascular disease, unspecified: Secondary | ICD-10-CM | POA: Diagnosis not present

## 2021-04-30 DIAGNOSIS — I209 Angina pectoris, unspecified: Secondary | ICD-10-CM

## 2021-04-30 DIAGNOSIS — I10 Essential (primary) hypertension: Secondary | ICD-10-CM

## 2021-04-30 DIAGNOSIS — I340 Nonrheumatic mitral (valve) insufficiency: Secondary | ICD-10-CM

## 2021-04-30 LAB — MYOCARDIAL PERFUSION IMAGING
Base ST Depression (mm): 0 mm
LV dias vol: 45 mL (ref 46–106)
LV sys vol: 10 mL
Nuc Stress EF: 78 %
Peak HR: 76 {beats}/min
Rest HR: 56 {beats}/min
Rest Nuclear Isotope Dose: 10.1 mCi
SDS: 0
SRS: 0
SSS: 0
ST Depression (mm): 0 mm
Stress Nuclear Isotope Dose: 30.9 mCi
TID: 0.88

## 2021-04-30 MED ORDER — REGADENOSON 0.4 MG/5ML IV SOLN
0.4000 mg | Freq: Once | INTRAVENOUS | Status: AC
  Administered 2021-04-30: 0.4 mg via INTRAVENOUS

## 2021-04-30 MED ORDER — TECHNETIUM TC 99M TETROFOSMIN IV KIT
10.1000 | PACK | Freq: Once | INTRAVENOUS | Status: AC | PRN
  Administered 2021-04-30: 10.1 via INTRAVENOUS
  Filled 2021-04-30: qty 11

## 2021-04-30 MED ORDER — TECHNETIUM TC 99M TETROFOSMIN IV KIT
30.9000 | PACK | Freq: Once | INTRAVENOUS | Status: AC | PRN
  Administered 2021-04-30: 30.9 via INTRAVENOUS
  Filled 2021-04-30: qty 31

## 2021-05-01 ENCOUNTER — Encounter: Payer: Self-pay | Admitting: Physician Assistant

## 2021-05-13 ENCOUNTER — Telehealth (INDEPENDENT_AMBULATORY_CARE_PROVIDER_SITE_OTHER): Payer: Medicare PPO | Admitting: Family Medicine

## 2021-05-13 ENCOUNTER — Encounter: Payer: Self-pay | Admitting: Family Medicine

## 2021-05-13 VITALS — Temp 99.0°F | Ht 59.0 in

## 2021-05-13 DIAGNOSIS — I739 Peripheral vascular disease, unspecified: Secondary | ICD-10-CM

## 2021-05-13 DIAGNOSIS — R051 Acute cough: Secondary | ICD-10-CM | POA: Diagnosis not present

## 2021-05-13 DIAGNOSIS — J989 Respiratory disorder, unspecified: Secondary | ICD-10-CM | POA: Diagnosis not present

## 2021-05-13 DIAGNOSIS — U071 COVID-19: Secondary | ICD-10-CM

## 2021-05-13 MED ORDER — BENZONATATE 100 MG PO CAPS: 200.0000 mg | ORAL_CAPSULE | Freq: Two times a day (BID) | ORAL | 0 refills | Status: DC | PRN

## 2021-05-13 MED ORDER — BENZONATATE 100 MG PO CAPS: 200.0000 mg | ORAL_CAPSULE | Freq: Two times a day (BID) | ORAL | 0 refills | Status: AC | PRN

## 2021-05-13 MED ORDER — MOLNUPIRAVIR EUA 200MG CAPSULE: 4.0000 | ORAL_CAPSULE | Freq: Two times a day (BID) | ORAL | 0 refills | Status: AC

## 2021-05-13 MED ORDER — ALBUTEROL SULFATE HFA 108 (90 BASE) MCG/ACT IN AERS
2.0000 | INHALATION_SPRAY | Freq: Four times a day (QID) | RESPIRATORY_TRACT | 0 refills | Status: DC | PRN
Start: 2021-05-13 — End: 2021-08-26

## 2021-05-13 MED ORDER — PREDNISONE 20 MG PO TABS
40.0000 mg | ORAL_TABLET | Freq: Every day | ORAL | 0 refills | Status: AC
Start: 2021-05-13 — End: 2021-05-16

## 2021-05-13 MED ORDER — MOLNUPIRAVIR EUA 200MG CAPSULE: 4.0000 | ORAL_CAPSULE | Freq: Two times a day (BID) | ORAL | 0 refills | Status: DC

## 2021-05-13 NOTE — Progress Notes (Signed)
Virtual Visit via Video Note I connected with Eileen Hansen on 05/13/21 by a video enabled telemedicine application and verified that I am speaking with the correct person using two identifiers.  Location patient: home Location provider:work office Persons participating in the virtual visit: patient, provider  I discussed the limitations of evaluation and management by telemedicine and the availability of in person appointments. The patient expressed understanding and agreed to proceed.  Chief Complaint  Patient presents with   flu like symptoms   HPI: Eileen Hansen is a 75 year old female with history of hyperlipidemia, hypertension, and PAD complaining of respiratory symptoms that is started 3 days ago. Fever, chills, fatigue, decreased appetite, sore throat, mild nasal congestion, clear rhinorrhea, nonproductive cough, occasional wheezing, and body aches. Max temp 101.0 F, today 99.0 F. O2 sats 95-96%  Generalized headache. No associated visual changes or focal neurologic deficit. Negative for anosmia, ageusia, CP, palpitation, dyspnea, abdominal pain, nausea, vomiting, urinary symptoms, or skin rash. She took Tylenol x1.  For the first today she was in bed most of the day, she is feeling a little better today. Home COVID-19 test done yesterday and positive. No known sick contact. COVID-19 vaccination completed.  PAD: She has an appointment with vascular next month. Having bilateral calves pain now. No edema ,cyanosis,or erythema. For about a year she has had right calf pain.  Cardiologist increased dose of Crestor from 10 mg to 20 mg, she has noted worsening upper back and neck pain.She is concerned about statin possible side effects. She has history of neck pain, started a few months ago. Local ice and massage seem to help. Pain is not radiated.  ROS: See pertinent positives and negatives per HPI.  Past Medical History:  Diagnosis Date   Allergic rhinitis    Arthralgia  09/30/2015   Bronchitis, acute 01/09/2017   Chronic shoulder pain 12/07/2016   Epistaxis 09/30/2015   Globus sensation 02/23/2015   Heart murmur 12/07/2016   HTN (hypertension)    Left hip pain 09/30/2015   Lexiscan Myoview 04/2021    Myoview 10/22: EF 78, no ischemia or infarction; low risk   Medicare annual wellness visit, subsequent 04/14/2015   Pain in the wrist, unspecified laterality 03/03/2014   Tachycardia    Episodic    Past Surgical History:  Procedure Laterality Date   APPENDECTOMY  75 yrs old   BUNIONECTOMY     WISDOM TOOTH EXTRACTION  75 yrs old    Family History  Problem Relation Age of Onset   Cancer Father        melanoma   Diabetes Mother    Hypertension Mother    Hyperlipidemia Mother    Stroke Mother    Heart attack Mother    Heart attack Brother    Cancer Brother        pancreatic   Colon cancer Neg Hx    Breast cancer Neg Hx    Coronary artery disease Neg Hx     Social History   Socioeconomic History   Marital status: Married    Spouse name: Not on file   Number of children: 1   Years of education: Not on file   Highest education level: Not on file  Occupational History   Occupation: Dentist: RETIRED  Tobacco Use   Smoking status: Never   Smokeless tobacco: Never  Substance and Sexual Activity   Alcohol use: Yes    Comment:  occ   Drug use: No  Sexual activity: Yes    Comment: lives with husband  Other Topics Concern   Not on file  Social History Narrative   University - [place], Masters Degree - counseling, [employer]   Married '69 - 7 years, divorced; married '82   21 daughter - '72; 2 grandchildren      Regular Exercise -  YES         Social Determinants of Health   Financial Resource Strain: Low Risk    Difficulty of Paying Living Expenses: Not hard at all  Food Insecurity: No Food Insecurity   Worried About Programme researcher, broadcasting/film/video in the Last Year: Never true   Barista in the Last  Year: Never true  Transportation Needs: No Transportation Needs   Lack of Transportation (Medical): No   Lack of Transportation (Non-Medical): No  Physical Activity: Insufficiently Active   Days of Exercise per Week: 2 days   Minutes of Exercise per Session: 30 min  Stress: No Stress Concern Present   Feeling of Stress : Not at all  Social Connections: Moderately Isolated   Frequency of Communication with Friends and Family: Three times a week   Frequency of Social Gatherings with Friends and Family: Three times a week   Attends Religious Services: Never   Active Member of Clubs or Organizations: No   Attends Banker Meetings: Never   Marital Status: Married  Catering manager Violence: Not At Risk   Fear of Current or Ex-Partner: No   Emotionally Abused: No   Physically Abused: No   Sexually Abused: No   Current Outpatient Medications:    albuterol (VENTOLIN HFA) 108 (90 Base) MCG/ACT inhaler, Inhale 2 puffs into the lungs every 6 (six) hours as needed for wheezing or shortness of breath., Disp: 6.7 g, Rfl: 0   aspirin EC 81 MG tablet, Take 81 mg by mouth daily. Swallow whole., Disp: , Rfl:    COLLAGEN PO, Take by mouth daily., Disp: , Rfl:    hydrochlorothiazide (HYDRODIURIL) 25 MG tablet, TAKE ONE TABLET BY MOUTH ONE TIME DAILY, Disp: 90 tablet, Rfl: 2   lisinopril (ZESTRIL) 10 MG tablet, Take 2 tablets (20 mg total) by mouth daily., Disp: 180 tablet, Rfl: 2   metoprolol tartrate (LOPRESSOR) 25 MG tablet, TAKE HALF TABLET BY MOUTH TWICE DAILY, Disp: 90 tablet, Rfl: 2   omega-3 acid ethyl esters (LOVAZA) 1 g capsule, Take 1 capsule (1 g total) by mouth 2 (two) times daily., Disp: 180 capsule, Rfl: 3   rosuvastatin (CRESTOR) 20 MG tablet, Take 1 tablet (20 mg total) by mouth daily., Disp: 90 tablet, Rfl: 3   TURMERIC PO, Take 1 capsule by mouth in the morning and at bedtime., Disp: , Rfl:   EXAM:  VITALS per patient if applicable:Temp 99 F (37.2 C)   Ht 4\' 11"   (1.499 m)   SpO2 96%   BMI 23.83 kg/m   GENERAL: alert, oriented, appears well and in no acute distress  HEENT: atraumatic, conjunctiva clear, no obvious abnormalities on inspection of external nose and ears  NECK: normal movements of the head and neck  LUNGS: on inspection no signs of respiratory distress, breathing rate appears normal, no obvious gross SOB, gasping or wheezing.  CV: no obvious cyanosis  MS: moves all visible extremities without noticeable abnormality  PSYCH/NEURO: pleasant and cooperative, no obvious depression or anxiety, speech and thought processing grossly intact  ASSESSMENT AND PLAN:  Discussed the following assessment and plan:  COVID-19 virus infection - Plan: molnupiravir EUA (LAGEVRIO) 200 mg CAPS capsule, DISCONTINUED: molnupiravir EUA (LAGEVRIO) 200 mg CAPS capsule We discussed Dx,possible complications and treatment options. She has a mild to moderate case with risk for complications. We discussed oral antiviral options and side effects. She agrees with trying Molnupiravir. Symptomatic treatment with plenty of fluids,rest,tylenol 500 mg 3-4 times per day prn. Throat lozenges if needed for sore throat.  Clearly instructed about warning signs.  Acute cough - Plan: benzonatate (TESSALON) 100 MG capsule, DISCONTINUED: benzonatate (TESSALON) 100 MG capsule Explained that cough and congestion may last a few more days and even weeks after acute symptoms have resolved. Adequate hydration.  PAD (peripheral artery disease) (HCC) We discussed some statin side effects. Recommend continuing Crestor 20 mg daily and Aspirin 81 mg daily. Has appt with vascular 06/02/21.  Reactive airway disease without asthma - Plan: predniSONE (DELTASONE) 20 MG tablet, albuterol (VENTOLIN HFA) 108 (90 Base) MCG/ACT inhaler Recommend Albuterol inh 2 puff every 6 hours for a week then as needed for wheezing or shortness of breath.  If still wheezing in 48 hours ,she can start  course of Prednisone 40 mg with breakfast x 3 days.  I discussed the assessment and treatment plan with the patient. Eileen Hansen was provided an opportunity to ask questions and all were answered. The patient agreed with the plan and demonstrated an understanding of the instructions.  Return if symptoms worsen or fail to improve.  Eileen Hansen G. [name], MD  [facility]. [place] office.

## 2021-06-02 ENCOUNTER — Encounter: Payer: Self-pay | Admitting: Obstetrics and Gynecology

## 2021-06-02 ENCOUNTER — Encounter: Payer: Self-pay | Admitting: Cardiovascular Disease

## 2021-06-02 ENCOUNTER — Other Ambulatory Visit: Payer: Self-pay

## 2021-06-02 ENCOUNTER — Ambulatory Visit: Payer: Medicare PPO | Admitting: Obstetrics and Gynecology

## 2021-06-02 ENCOUNTER — Ambulatory Visit: Payer: Medicare PPO | Admitting: Cardiovascular Disease

## 2021-06-02 VITALS — BP 148/72 | HR 82 | Ht 59.0 in | Wt 115.8 lb

## 2021-06-02 VITALS — BP 159/94 | HR 80 | Ht 59.0 in | Wt 115.0 lb

## 2021-06-02 DIAGNOSIS — I739 Peripheral vascular disease, unspecified: Secondary | ICD-10-CM

## 2021-06-02 DIAGNOSIS — I10 Essential (primary) hypertension: Secondary | ICD-10-CM

## 2021-06-02 DIAGNOSIS — R350 Frequency of micturition: Secondary | ICD-10-CM

## 2021-06-02 DIAGNOSIS — N812 Incomplete uterovaginal prolapse: Secondary | ICD-10-CM

## 2021-06-02 DIAGNOSIS — N8110 Cystocele, unspecified: Secondary | ICD-10-CM

## 2021-06-02 DIAGNOSIS — E785 Hyperlipidemia, unspecified: Secondary | ICD-10-CM | POA: Diagnosis not present

## 2021-06-02 LAB — POCT URINALYSIS DIPSTICK
Appearance: NORMAL
Bilirubin, UA: NEGATIVE
Blood, UA: NEGATIVE
Glucose, UA: NEGATIVE
Ketones, UA: NEGATIVE
Leukocytes, UA: NEGATIVE
Nitrite, UA: NEGATIVE
Protein, UA: NEGATIVE
Spec Grav, UA: 1.015 (ref 1.010–1.025)
Urobilinogen, UA: 0.2 E.U./dL
pH, UA: 6 (ref 5.0–8.0)

## 2021-06-02 NOTE — Progress Notes (Signed)
Cardiology Office Note   Date:  06/02/2021   ID:  Eileen Hansen Dec 18, 1945, MRN [id number]  PCP:  [name], [name], MD  Cardiologist: Dr. Shari Prows  No chief complaint on file.     History of Present Illness: Eileen Hansen is a 75 y.o. female who was referred by Tereso Newcomer for evaluation and management of peripheral arterial disease. She has known history of mild mitral and aortic regurgitation, essential hypertension, diet-controlled diabetes and hyperlipidemia.  Carotid Doppler in September of this year showed mild nonobstructive disease. She reports symptoms of right calf claudication that happens after walking about 10 minutes.  She has to stop and rest for few minutes and then she can resume without issues.  She started having similar symptoms on the left side but not as severe.  She has no rest pain or lower extremity ulceration.  No chest pain or shortness of breath.  She is a lifelong non-smoker.  Lower extremity arterial Doppler showed an ABI of 0.81 on the right and 0.91 on the left.  This was followed by lower extremity arterial duplex in October which showed significant stenosis in the right mid SFA and moderate disease affecting the left SFA.  Past Medical History:  Diagnosis Date   Allergic rhinitis    Arthralgia 09/30/2015   Bronchitis, acute 01/09/2017   Chronic shoulder pain 12/07/2016   Epistaxis 09/30/2015   Globus sensation 02/23/2015   Heart murmur 12/07/2016   HTN (hypertension)    Left hip pain 09/30/2015   Lexiscan Myoview 04/2021    Myoview 10/22: EF 78, no ischemia or infarction; low risk   Medicare annual wellness visit, subsequent 04/14/2015   Pain in the wrist, unspecified laterality 03/03/2014   Tachycardia    Episodic    Past Surgical History:  Procedure Laterality Date   APPENDECTOMY  75 yrs old   BUNIONECTOMY     WISDOM TOOTH EXTRACTION  75 yrs old     Current Outpatient Medications  Medication Sig Dispense Refill   albuterol  (VENTOLIN HFA) 108 (90 Base) MCG/ACT inhaler Inhale 2 puffs into the lungs every 6 (six) hours as needed for wheezing or shortness of breath. 6.7 g 0   aspirin EC 81 MG tablet Take 81 mg by mouth daily. Swallow whole.     COLLAGEN PO Take by mouth daily.     hydrochlorothiazide (HYDRODIURIL) 25 MG tablet TAKE ONE TABLET BY MOUTH ONE TIME DAILY 90 tablet 2   lisinopril (ZESTRIL) 10 MG tablet Take 2 tablets (20 mg total) by mouth daily. 180 tablet 2   metoprolol tartrate (LOPRESSOR) 25 MG tablet TAKE HALF TABLET BY MOUTH TWICE DAILY 90 tablet 2   omega-3 acid ethyl esters (LOVAZA) 1 g capsule Take 1 capsule (1 g total) by mouth 2 (two) times daily. 180 capsule 3   rosuvastatin (CRESTOR) 20 MG tablet Take 1 tablet (20 mg total) by mouth daily. 90 tablet 3   TURMERIC PO Take 1 capsule by mouth in the morning and at bedtime.     No current facility-administered medications for this visit.    Allergies:   Fenofibrate and Penicillins    Social History:  The patient  reports that she has never smoked. She has never used smokeless tobacco. She reports current alcohol use. She reports that she does not use drugs.   Family History:  The patient's family history includes Cancer in her brother and father; Diabetes in her mother; Heart attack in her brother and  mother; Hyperlipidemia in her mother; Hypertension in her mother; Stroke in her mother.    ROS:  Please see the history of present illness.   Otherwise, review of systems are positive for none.   All other systems are reviewed and negative.    PHYSICAL EXAM: VS:  BP (!) 148/72   Pulse 82   Ht 4\' 11"  (1.499 m)   Wt 115 lb 12.8 oz (52.5 kg)   SpO2 97%   BMI 23.39 kg/m  , BMI Body mass index is 23.39 kg/m. GEN: Well nourished, well developed, in no acute distress  HEENT: normal  Neck: no JVD, carotid bruits, or masses Cardiac: RRR; no murmurs, rubs, or gallops,no edema  Respiratory:  clear to auscultation bilaterally, normal work of  breathing GI: soft, nontender, nondistended, + BS MS: no deformity or atrophy  Skin: warm and dry, no rash Neuro:  Strength and sensation are intact Psych: euthymic mood, full affect Vascular: Radial pulse:normal bilaterally.  Femoral: Normal bilaterally.  Distal pulses are +1 on the right and +2 on the left.   EKG:  EKG is not ordered today.    Recent Labs: 02/18/2021: ALT 17; BUN 27; Creatinine, Ser 0.73; Hemoglobin 13.4; Platelets 251.0; Potassium 4.1; Sodium 140    Lipid Panel    Component Value Date/Time   CHOL 131 02/18/2021 1005   TRIG 132.0 02/18/2021 1005   HDL 32.40 (L) 02/18/2021 1005   CHOLHDL 4 02/18/2021 1005   VLDL 26.4 02/18/2021 1005   LDLCALC 72 02/18/2021 1005   LDLCALC 64 03/26/2020 1259   LDLDIRECT 152.0 06/25/2019 1536      Wt Readings from Last 3 Encounters:  06/02/21 115 lb 12.8 oz (52.5 kg)  04/30/21 118 lb (53.5 kg)  04/28/21 118 lb 12.8 oz (53.9 kg)      No flowsheet data found.    ASSESSMENT AND PLAN:  1.  Peripheral arterial disease with intermittent claudication: The patient has moderate right calf claudication which does not seem to be lifestyle limiting.  She has mild symptoms on the left side.  I had an extensive discussion with her about the natural history and management of claudication.  This is not a limb threatening situation and the chance of her progressing to critical limb ischemia is overall low given that she is not a smoker and does not have uncontrolled diabetes. Given that her symptoms are currently not lifestyle limiting, I do not recommend revascularization at this point. I discussed with her the importance of starting a walking exercise program and provided her with instructions.  We also discussed heart healthy diet.  Angiography and revascularization can be considered if symptoms do not improve.  2.  Essential hypertension: Blood pressure is reasonably controlled.  3.  Hyperlipidemia: I reviewed most recent lipid  profile done last year which showed an LDL of 64 which is at target.  Continue rosuvastatin 20 mg daily.    Disposition:   FU with me in 6 months  Signed,  06/28/21, MD  06/02/2021 11:30 AM    [facility]

## 2021-06-02 NOTE — Patient Instructions (Signed)
You have a stage 2 (out of 4) prolapse.  We discussed the fact that it is not life threatening but there are several treatment options. For treatment of pelvic organ prolapse, we discussed options for management including expectant management, conservative management, and surgical management, such as Kegels, a pessary, pelvic floor physical therapy, and specific surgical procedures.    

## 2021-06-02 NOTE — Patient Instructions (Signed)
Medication Instructions:  Your physician recommends that you continue on your current medications as directed. Please refer to the Current Medication list given to you today.  *If you need a refill on your cardiac medications before your next appointment, please call your pharmacy*  Follow-Up: At [facility], you and your health needs are our priority.  As part of our continuing mission to provide you with exceptional heart care, we have created designated Provider Care Teams.  These Care Teams include your primary Cardiologist (physician) and Advanced Practice Providers (APPs -  Physician Assistants and Nurse Practitioners) who all work together to provide you with the care you need, when you need it.  We recommend signing up for the patient portal called "MyChart".  Sign up information is provided on this After Visit Summary.  MyChart is used to connect with patients for Virtual Visits (Telemedicine).  Patients are able to view lab/test results, encounter notes, upcoming appointments, etc.  Non-urgent messages can be sent to your provider as well.   To learn more about what you can do with MyChart, go to [email or web address].    Your next appointment:   6 month(s)  The format for your next appointment:   In Person  Provider:   Dr. Kirke Corin    EXERCISE PROGRAM FOR INDIVIDUALS WITH  PERIPHERAL ARTERIAL DISEASE (PAD)   General Information:   Research in vascular exercise has demonstrated remarkable improvement in symptoms of leg pain (claudication) without expensive or invasive interventions. Regular walking programs are extremely helpful for patients with PAD and intermittent claudication.  These steps are designed to help you get started with a safe and effective program to help you walk farther with less pain:   Walk at least three times a week (preferably every day).  Your goal is to build up to 30-45 minutes of total walking time (not counting rest breaks). It may take you several  weeks to build up your exercise time starting at 5-10 minutes or whatever you can tolerate.  Walk as far as possible using moderate to maximal pain (7-8 on the scale below) as a signal to stop, and resume walking when the pain goes away.  On a treadmill, set the speed and grade at a level that brings on the claudication pain within 3 to 5 minutes. Walk at this rate until you experience claudication of moderate severity, rest until the pain improves, and then resume walking.  Over time, you will be able to walk longer at the designated speed and grade; workload should then be increased until you develop the pain within 3 to 5 minutes once again.  This regimen will induce a significant benefit. Studies have demonstrated that participants may be able to walk up to three or four times farther and have less leg pain, within twelve weeks, by following this protocol.  Pain Scale    0_____1_____2_____3_____4_____5_____6_____7_____8_____9_____10   No Pain                                   Moderate Pain                               Maximal Pain

## 2021-06-02 NOTE — Progress Notes (Signed)
[facility] New Patient Evaluation and Consultation  Referring Provider: Janyth Contes, * PCP: Martinique, [name], MD Date of Service: 06/02/2021  SUBJECTIVE Chief Complaint: New Patient (Initial Visit) Eileen Hansen is a 75 y.o. female here for a consult for prolapse. Pt said it is worse at the end of the day.)  History of Present Illness: Eileen Hansen is a 75 y.o.  hispanic  female seen in consultation at the request of Dr. Sandford Craze for evaluation of prolapse.    Review of records from Dr Sandford Craze significant for: Feels egg size vaginal bulge, comes and goes. Considering surgery.   Urinary Symptoms: Does not leak urine.   Day time voids 5-6.  Nocturia: 0 times per night to void. Voiding dysfunction: she empties her bladder well.  does not use a catheter to empty bladder.  When urinating, she feels she has no difficulties  UTIs:  0  UTI's in the last year.   Denies history of blood in urine and kidney or bladder stones  Pelvic Organ Prolapse Symptoms:                  She Admits to a feeling of a bulge the vaginal area. It has been present for 6 months.  She Admits to seeing a bulge. Can see with a mirror.  This bulge is bothersome. As she walks and moves, it gets worse throughout the day.  No prior treatment.   Bowel Symptom: Bowel movements: every other day Stool consistency: hard or soft  Straining: yes, sometimes Splinting: no.  Incomplete evacuation: no.  She Denies accidental bowel leakage / fecal incontinence Bowel regimen: none   Sexual Function Sexually active: no.   Pelvic Pain Denies pelvic pain    Past Medical History:  Past Medical History:  Diagnosis Date   Allergic rhinitis    Arthralgia 09/30/2015   Bronchitis, acute 01/09/2017   Chronic shoulder pain 12/07/2016   Epistaxis 09/30/2015   Globus sensation 02/23/2015   Heart murmur 12/07/2016   HTN (hypertension)    Left hip pain 09/30/2015   Lexiscan Myoview  04/2021    Myoview 10/22: EF 78, no ischemia or infarction; low risk   Medicare annual wellness visit, subsequent 04/14/2015   Pain in the wrist, unspecified laterality 03/03/2014   Tachycardia    Episodic     Past Surgical History:   Past Surgical History:  Procedure Laterality Date   APPENDECTOMY  75 yrs old   BUNIONECTOMY     WISDOM TOOTH EXTRACTION  75 yrs old     Past OB/GYN History: OB History  Gravida Para Term Preterm AB Living  1 1       1   SAB IAB Ectopic Multiple Live Births          1    # Outcome Date GA Lbr Len/2nd Weight Sex Delivery Anes PTL Lv  1 Para            Vaginal deliveries: 1 Menopausal: Yes, at age 1, Denies vaginal bleeding since menopause    Medications: She has a current medication list which includes the following prescription(s): albuterol, aspirin ec, collagen, hydrochlorothiazide, lisinopril, metoprolol tartrate, omega-3 acid ethyl esters, rosuvastatin, and turmeric.   Allergies: Patient is allergic to fenofibrate and penicillins.   Social History:  Social History   Tobacco Use   Smoking status: Never   Smokeless tobacco: Never  Vaping Use   Vaping Use: Never used  Substance Use Topics   Alcohol use: Yes  Comment:  occ   Drug use: No    Relationship status: married She lives with husband.   She is not employed  . Regular exercise: No History of abuse: No  Family History:   Family History  Problem Relation Age of Onset   Cancer Father        melanoma   Diabetes Mother    Hypertension Mother    Hyperlipidemia Mother    Stroke Mother    Heart attack Mother    Heart attack Brother    Cancer Brother        pancreatic   Colon cancer Neg Hx    Breast cancer Neg Hx    Coronary artery disease Neg Hx      Review of Systems: Review of Systems  Constitutional:  Negative for fever, malaise/fatigue and weight loss.  Respiratory:  Negative for cough, shortness of breath and wheezing.   Cardiovascular:  Negative for  chest pain, palpitations and leg swelling.  Gastrointestinal:  Negative for abdominal pain and blood in stool.  Genitourinary:  Negative for dysuria.  Musculoskeletal:  Positive for myalgias.  Skin:  Negative for rash.  Neurological:  Negative for dizziness and headaches.  Endo/Heme/Allergies:  Does not bruise/bleed easily.  Psychiatric/Behavioral:  Negative for depression. The patient is not nervous/anxious.     OBJECTIVE Physical Exam: Vitals:   06/02/21 1341  BP: (!) 159/94  Pulse: 80  Weight: 115 lb (52.2 kg)  Height: 4\' 11"  (1.499 m)    Physical Exam Constitutional:      General: She is not in acute distress. Pulmonary:     Effort: Pulmonary effort is normal.  Abdominal:     General: There is no distension.     Palpations: Abdomen is soft.     Tenderness: There is no abdominal tenderness. There is no rebound.  Musculoskeletal:        General: No swelling. Normal range of motion.  Skin:    General: Skin is warm and dry.     Findings: No rash.  Neurological:     Mental Status: She is alert and oriented to person, place, and time.  Psychiatric:        Mood and Affect: Mood normal.        Behavior: Behavior normal.     GU / Detailed Urogynecologic Evaluation:  Pelvic Exam: Normal external female genitalia; Bartholin's and Skene's glands normal in appearance; urethral meatus normal in appearance, no urethral masses or discharge.   CST: negative  Speculum exam reveals normal vaginal mucosa with atrophy. Cervix normal appearance. Uterus normal single, nontender. Adnexa no mass, fullness, tenderness.     Pelvic floor strength I/V  Pelvic floor musculature: Right levator non-tender, Right obturator non-tender, Left levator non-tender, Left obturator non-tender  POP-Q:   POP-Q  0                                            Aa   0                                           Ba  -4  C   3.5                                             Gh  3                                            Pb  7                                            tvl   -3                                            Ap  -3                                            Bp  -5.5                                              D     Rectal Exam:  Normal external rectum  Post-Void Residual (PVR) by Bladder Scan: In order to evaluate bladder emptying, we discussed obtaining a postvoid residual and she agreed to this procedure.  Procedure: The ultrasound unit was placed on the patient's abdomen in the suprapubic region after the patient had voided. A PVR of 17 ml was obtained by bladder scan.  Laboratory Results: POC urine: negative  I visualized the urine specimen, noting the specimen to be clear yellow  ASSESSMENT AND PLAN Ms. Averitt is a 75 y.o. with:  1. Prolapse of anterior vaginal wall   2. Uterovaginal prolapse, incomplete   3. Urinary frequency    Stage II anterior, Stage I posterior, Stage I apical prolapse - For treatment of pelvic organ prolapse, we discussed options for management including expectant management, conservative management, and surgical management, such as Kegels, a pessary, pelvic floor physical therapy, and specific surgical procedures. - She is potentially interested in surgery- recommended either sacrospinous hysteropexy vs TVH, uterosacral suspension with anterior repair.  - She would like to first try a pessary before surgery. Will return for a fitting.   [name], MD

## 2021-06-09 ENCOUNTER — Ambulatory Visit: Payer: Medicare PPO | Admitting: Cardiovascular Disease

## 2021-06-30 DIAGNOSIS — Z1231 Encounter for screening mammogram for malignant neoplasm of breast: Secondary | ICD-10-CM | POA: Diagnosis not present

## 2021-06-30 LAB — HM MAMMOGRAPHY

## 2021-07-02 NOTE — Progress Notes (Signed)
[facility]   Subjective:     Chief Complaint: Pessary Fitting  History of Present Illness: Eileen Hansen is a 75 y.o. female with stage II pelvic organ prolapse who presents today for a pessary fitting.    Past Medical History: Patient  has a past medical history of Allergic rhinitis, Arthralgia (09/30/2015), Bronchitis, acute (01/09/2017), Chronic shoulder pain (12/07/2016), Epistaxis (09/30/2015), Globus sensation (02/23/2015), Heart murmur (12/07/2016), HTN (hypertension), Left hip pain (09/30/2015), Lexiscan Myoview 04/2021, Medicare annual wellness visit, subsequent (04/14/2015), Pain in the wrist, unspecified laterality (03/03/2014), and Tachycardia.   Past Surgical History: She  has a past surgical history that includes Bunionectomy; Appendectomy (75 yrs old); and Wisdom tooth extraction (76 yrs old).   Medications: She has a current medication list which includes the following prescription(s): albuterol, aspirin ec, collagen, hydrochlorothiazide, lisinopril, metoprolol tartrate, omega-3 acid ethyl esters, rosuvastatin, and turmeric.   Allergies: Patient is allergic to fenofibrate and penicillins.   Social History: Patient  reports that she has never smoked. She has never used smokeless tobacco. She reports current alcohol use. She reports that she does not use drugs.      Objective:    BP (!) 148/82    Pulse 76    Wt 111 lb (50.3 kg)    BMI 22.42 kg/m  Gen: No apparent distress, A&O x 3. Pelvic Exam: Normal external female genitalia; Bartholin's and Skene's glands normal in appearance; urethral meatus normal in appearance, no urethral masses or discharge.   A size #2 and #3 ring with support pessary was fitted. The #2 was more comfortable and it stayed in place with valsalva and was an appropriate size on examination, with one finger fitting between the pessary and the vaginal walls. We tied a string to it and the patient demonstrated proper removal and  replacement. Lot #[id number], Exp 12/26/25  POP-Q (06/02/21):    POP-Q   0                                            Aa   0                                           Ba   -4                                              C    3.5                                            Gh   3                                            Pb   7  tvl    -3                                            Ap   -3                                            Bp   -5.5                                              D       Assessment/Plan:    Assessment: Eileen Hansen is a 75 y.o. with stage II pelvic organ prolapse who presents for a pessary fitting. Plan: She was fitted with a #2 ring with support pessary. She will keep the pessary in place until next visit. She eventually may want to learn to remove it herself, she may try at home as it was demonstrated to her.   Follow-up in 3 weeks for a pessary check or sooner as needed.  All questions were answered.    [name], MD

## 2021-07-03 ENCOUNTER — Ambulatory Visit: Payer: Medicare PPO | Admitting: Obstetrics and Gynecology

## 2021-07-03 ENCOUNTER — Other Ambulatory Visit: Payer: Self-pay

## 2021-07-03 ENCOUNTER — Encounter: Payer: Self-pay | Admitting: Family Medicine

## 2021-07-03 ENCOUNTER — Encounter: Payer: Self-pay | Admitting: Obstetrics and Gynecology

## 2021-07-03 VITALS — BP 148/82 | HR 76 | Wt 111.0 lb

## 2021-07-03 DIAGNOSIS — N812 Incomplete uterovaginal prolapse: Secondary | ICD-10-CM

## 2021-07-03 DIAGNOSIS — N8110 Cystocele, unspecified: Secondary | ICD-10-CM

## 2021-07-28 NOTE — Progress Notes (Deleted)
Cardiology Office Note:    Date:  07/28/2021   ID:  Eileen Hansen, DOB [birth year], MRN [id number]  PCP:  Martinique, [name], MD  [facility] Cardiologist:  Freada Bergeron, MD  [facility] Electrophysiologist:  None   Referring MD: Martinique, [name], MD      Patient Profile:    Eileen Hansen is a 76 y.o. female with history of essential hypertension, HLD, prediabetes and osteoarthritis who returns to clinic for follow-up   History of Present Illness:    Eileen Hansen  Is a 76 year old female with history of essential HTN, HLD, prediabetes, and OA who returns to clinic for follow-up.  Patient has a history of hypertension that was diagnosed over 10 years ago. She is currently on metop tartrate 25mg  BID, lisinopril 10mg  daily and HCTZ 25mg  daily with well controlled blood pressures.  PCP noted soft systolic murmur on exam for which she was referred to Cardiology for further evaluation.  Was seen on 04/09/20 where she was complaining of RLE pain and knee pain. No anginal symptoms. TTE 03/2021 with LVEF 60-65%, G1DD, trivial MR aortic sclerosis.     Past Medical History:  Diagnosis Date   Allergic rhinitis    Arthralgia 09/30/2015   Bronchitis, acute 01/09/2017   Chronic shoulder pain 12/07/2016   Epistaxis 09/30/2015   Globus sensation 02/23/2015   Heart murmur 12/07/2016   HTN (hypertension)    Left hip pain 09/30/2015   Lexiscan Myoview 04/2021    Myoview 10/22: EF 78, no ischemia or infarction; low risk   Medicare annual wellness visit, subsequent 04/14/2015   Pain in the wrist, unspecified laterality 03/03/2014   Tachycardia    Episodic    Current Medications: No outpatient medications have been marked as taking for the 07/30/21 encounter (Appointment) with Freada Bergeron, MD.     Allergies:   Fenofibrate and Penicillins   Social History   Tobacco Use   Smoking status: Never   Smokeless tobacco: Never  Vaping Use   Vaping Use: Never used  Substance Use  Topics   Alcohol use: Yes    Comment:  occ   Drug use: No     Family Hx: The patient's family history includes Cancer in her brother and father; Diabetes in her mother; Heart attack in her brother and mother; Hyperlipidemia in her mother; Hypertension in her mother; Stroke in her mother. There is no history of Colon cancer, Breast cancer, or Coronary artery disease.  ROS  The patient denies chest pain, chest pressure, dyspnea at rest or with exertion, palpitations, PND, orthopnea, or leg swelling. Denies cough, fever, chills. Denies nausea, vomiting. Denies syncope or presyncope. Denies dizziness or lightheadedness. Denies snoring.  EKGs/Labs/Other Test Reviewed:    EKG:  EKG is ordered today.  The ekg ordered today demonstrates NSR with HR 87.   Prior CV studies: TTE 11/2016:  Study Conclusions  - Left ventricle: The cavity size was normal. Wall thickness was    increased in a pattern of mild LVH. Systolic function was normal.    The estimated ejection fraction was in the range of 60% to 65%.    Wall motion was normal; there were no regional wall motion    abnormalities.  - Aortic valve: There was mild regurgitation.  - Mitral valve: There was mild regurgitation.  - Left atrium: The atrium was mildly dilated.  - Atrial septum: No defect or patent foramen ovale was identified.   Carotid artery ultrasound 11/07/2016:  IMPRESSION: Minimal plaque in the carotid arteries. No significant carotid artery stenosis. Patent vertebral arteries.  RLE doppler ultrasound 10/07/19: No DVT  Recent Labs: 02/18/2021: ALT 17; BUN 27; Creatinine, Ser 0.73; Hemoglobin 13.4; Platelets 251.0; Potassium 4.1; Sodium 140   Recent Lipid Panel Lab Results  Component Value Date/Time   CHOL 131 02/18/2021 10:05 AM   TRIG 132.0 02/18/2021 10:05 AM   HDL 32.40 (L) 02/18/2021 10:05 AM   CHOLHDL 4 02/18/2021 10:05 AM   LDLCALC 72 02/18/2021 10:05 AM   LDLCALC 64 03/26/2020 12:59 PM   LDLDIRECT 152.0  06/25/2019 03:36 PM    Physical Exam:    VS:  There were no vitals taken for this visit.    Wt Readings from Last 3 Encounters:  07/03/21 111 lb (50.3 kg)  06/02/21 115 lb (52.2 kg)  06/02/21 115 lb 12.8 oz (52.5 kg)     Physical Exam  GEN: Well nourished, well developed, in no acute distress HEENT: normal Neck: no JVD, carotid bruits, or masses Cardiac: RR, 2/6 systolic murmur and 1/6 diastolic murmur noted. 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 Garner Nash as scheduled   #Mild Aortic Regurgitation: #Mild Mitral Regurgitation: -TTE on 03/2021 showed trivial MR and AI -Continue serial monitoring  #Essential Hypertension: -Well controlled on current regimen and managed by PCP -Continue amlopdipine and HCTZ -Continue metoprolol 25mg  BID   #Hyperlipidemia: -Well controlled. Last LDL 61 in 10/24/19 -Continue crestor 10mg  per PCP  #Prediabetes: -HgA1C 6.1 -Continue lifestyle modifications including diet rich in fruit, vegetables and lean proteins as well as regular exercise with goal 137minutes of moderate exercise weekly -Follows regularly with PCP    Medication Adjustments/Labs and Tests Ordered: Current medicines are reviewed at length with the patient today.  Concerns regarding medicines are outlined above.  Tests Ordered: No orders of the defined types were placed in this  encounter.  Medication Changes: No orders of the defined types were placed in this encounter.   Signed, Freada Bergeron, MD  07/28/2021 12:07 PM    [address], [place], [place]  [address] Phone: [phone]; Fax: [phone]

## 2021-07-30 ENCOUNTER — Other Ambulatory Visit: Payer: Medicare PPO

## 2021-07-30 ENCOUNTER — Other Ambulatory Visit: Payer: Self-pay

## 2021-07-30 ENCOUNTER — Encounter: Payer: Self-pay | Admitting: Cardiology

## 2021-07-30 ENCOUNTER — Ambulatory Visit: Payer: Medicare PPO | Admitting: Cardiology

## 2021-07-30 VITALS — BP 132/80 | HR 64 | Ht 59.0 in | Wt 116.0 lb

## 2021-07-30 DIAGNOSIS — I739 Peripheral vascular disease, unspecified: Secondary | ICD-10-CM

## 2021-07-30 DIAGNOSIS — I340 Nonrheumatic mitral (valve) insufficiency: Secondary | ICD-10-CM

## 2021-07-30 DIAGNOSIS — I10 Essential (primary) hypertension: Secondary | ICD-10-CM

## 2021-07-30 DIAGNOSIS — R002 Palpitations: Secondary | ICD-10-CM

## 2021-07-30 DIAGNOSIS — R7303 Prediabetes: Secondary | ICD-10-CM | POA: Diagnosis not present

## 2021-07-30 DIAGNOSIS — I351 Nonrheumatic aortic (valve) insufficiency: Secondary | ICD-10-CM | POA: Diagnosis not present

## 2021-07-30 DIAGNOSIS — I209 Angina pectoris, unspecified: Secondary | ICD-10-CM

## 2021-07-30 DIAGNOSIS — E785 Hyperlipidemia, unspecified: Secondary | ICD-10-CM | POA: Diagnosis not present

## 2021-07-30 DIAGNOSIS — E7800 Pure hypercholesterolemia, unspecified: Secondary | ICD-10-CM

## 2021-07-30 LAB — LIPID PANEL
Chol/HDL Ratio: 3.2 ratio (ref 0.0–4.4)
Cholesterol, Total: 103 mg/dL (ref 100–199)
HDL: 32 mg/dL — ABNORMAL LOW (ref >39)
LDL Chol Calc (NIH): 54 mg/dL (ref 0–99)
Triglycerides: 84 mg/dL (ref 0–149)
VLDL Cholesterol Cal: 17 mg/dL (ref 5–40)

## 2021-07-30 LAB — HEPATIC FUNCTION PANEL
ALT: 11 IU/L (ref 0–32)
AST: 23 IU/L (ref 0–40)
Albumin: 4.6 g/dL (ref 3.7–4.7)
Alkaline Phosphatase: 56 IU/L (ref 44–121)
Bilirubin Total: 0.4 mg/dL (ref 0.0–1.2)
Bilirubin, Direct: 0.15 mg/dL (ref 0.00–0.40)
Total Protein: 7.1 g/dL (ref 6.0–8.5)

## 2021-07-30 MED ORDER — MAGNESIUM 200 MG PO TABS: 200.0000 mg | ORAL_TABLET | Freq: Every day | ORAL | 11 refills | Status: AC

## 2021-07-30 MED ORDER — CARVEDILOL 3.125 MG PO TABS: 3.1250 mg | ORAL_TABLET | Freq: Two times a day (BID) | ORAL | 3 refills | Status: DC

## 2021-07-30 NOTE — Progress Notes (Signed)
Cardiology Office Note:    Date:  07/30/2021   ID:  Eileen Hansen, DOB Aug 19, 1945, MRN [id number]  PCP:  Martinique, [name], MD  [facility] Cardiologist:  Freada Bergeron, MD  [facility] Electrophysiologist:  None   Referring MD: Martinique, [name], MD   Patient Profile:    Eileen Hansen is a 76 y.o. female with history of essential hypertension, HLD, prediabetes and osteoarthritis who returns to clinic for follow-up  History of Present Illness:    Eileen Hansen  Is a 76 year old female with history of essential HTN, HLD, prediabetes, and OA who returns to clinic for follow-up.  Patient has a history of hypertension that was diagnosed over 10 years ago. She is currently on metop tartrate 25mg  BID, lisinopril 10mg  daily and HCTZ 25mg  daily with well controlled blood pressures.  PCP noted soft systolic murmur on exam for which she was referred to Cardiology for further evaluation.  Was seen on 04/09/20 where she was complaining of RLE pain and knee pain. No anginal symptoms. TTE 03/2021 with LVEF 60-65%, G1DD, trivial MR aortic sclerosis.   Today, she is doing well. She records her blood pressure at home and reports measurements on average [id number] systolic. She had labs done today and results are pending.   She has several issues she would like to address. First, she has been having the sensation that she has to "catch her breath" when she lays down at night like her "heart skipped a beat." This happens frequently and only lasts for a brief second before resolving. No significant symptoms during the daytime. She also states, that 3 months ago, she had an episode of severe central chest pain. She describes the pain as if "someone was stabbing her heart". The pain caused her to wake up screaming. This has not recurred. No exertional chest pain.   She also endorses that she is bruising easily. She had a tooth extracted and had a bruise along her L jaw from the procedure. The bruising does not bother  her but she is worried about long term effects.   She experiences joint pain, especially in her bilateral shoulder, hands, and back. The pain worries her because is limits her range of motion. She tries to walk but is limited by pain in her bilateral knees.   The patient denies chest pressure, dyspnea at rest or with exertion, palpitations, PND, or leg swelling. Denies cough, fever, chills. Denies nausea, vomiting. Denies syncope or presyncope. Denies dizziness or lightheadedness. Denies snoring.  Past Medical History:  Diagnosis Date   Allergic rhinitis    Arthralgia 09/30/2015   Bronchitis, acute 01/09/2017   Chronic shoulder pain 12/07/2016   Epistaxis 09/30/2015   Globus sensation 02/23/2015   Heart murmur 12/07/2016   HTN (hypertension)    Left hip pain 09/30/2015   Lexiscan Myoview 04/2021    Myoview 10/22: EF 78, no ischemia or infarction; low risk   Medicare annual wellness visit, subsequent 04/14/2015   Pain in the wrist, unspecified laterality 03/03/2014   Tachycardia    Episodic    Current Medications: Current Meds  Medication Sig   aspirin EC 81 MG tablet Take 81 mg by mouth daily. Swallow whole.   carvedilol (COREG) 3.125 MG tablet Take 1 tablet (3.125 mg total) by mouth 2 (two) times daily.   Cholecalciferol (VITAMIN D3) 25 MCG (1000 UT) CAPS Take 1 capsule by mouth daily.   COLLAGEN PO Take by mouth daily.   hydrochlorothiazide (HYDRODIURIL)  25 MG tablet TAKE ONE TABLET BY MOUTH ONE TIME DAILY   lisinopril (ZESTRIL) 10 MG tablet Take 2 tablets (20 mg total) by mouth daily.   Magnesium 200 MG TABS Take 1 tablet (200 mg total) by mouth at bedtime.   omega-3 acid ethyl esters (LOVAZA) 1 g capsule Take 1 capsule (1 g total) by mouth 2 (two) times daily.   rosuvastatin (CRESTOR) 20 MG tablet Take 1 tablet (20 mg total) by mouth daily.   TURMERIC PO Take 1 capsule by mouth in the morning and at bedtime.   vitamin B-12 (CYANOCOBALAMIN) 1000 MCG tablet Take 1,000 mcg by  mouth daily.   [DISCONTINUED] metoprolol tartrate (LOPRESSOR) 25 MG tablet TAKE HALF TABLET BY MOUTH TWICE DAILY     Allergies:   Fenofibrate and Penicillins   Social History   Tobacco Use   Smoking status: Never   Smokeless tobacco: Never  Vaping Use   Vaping Use: Never used  Substance Use Topics   Alcohol use: Yes    Comment:  occ   Drug use: No     Family Hx: The patient's family history includes Cancer in her brother and father; Diabetes in her mother; Heart attack in her brother and mother; Hyperlipidemia in her mother; Hypertension in her mother; Stroke in her mother. There is no history of Colon cancer, Breast cancer, or Coronary artery disease.  Review of Systems  Constitutional:  Negative for chills and fever.  HENT:  Negative for congestion and sinus pain.   Eyes:  Negative for blurred vision and pain.  Respiratory:  Positive for shortness of breath. Negative for cough.   Cardiovascular:  Positive for chest pain and palpitations. Negative for orthopnea, claudication, leg swelling and PND.  Gastrointestinal:  Negative for blood in stool, heartburn and melena.  Genitourinary:  Negative for frequency and urgency.  Musculoskeletal:  Positive for joint pain. Negative for myalgias.  Skin:  Negative for itching and rash.  Neurological:  Negative for dizziness and headaches.  Endo/Heme/Allergies:  Bruises/bleeds easily.  Psychiatric/Behavioral:  The patient is not nervous/anxious.     EKGs/Labs/Other Test Reviewed:    EKG:  EKG was not ordered today 04/09/20: NSR with HR 87.   Prior CV studies: TTE 11/2016:  Study Conclusions  - Left ventricle: The cavity size was normal. Wall thickness was    increased in a pattern of mild LVH. Systolic function was normal.    The estimated ejection fraction was in the range of 60% to 65%.    Wall motion was normal; there were no regional wall motion    abnormalities.  - Aortic valve: There was mild regurgitation.  - Mitral valve:  There was mild regurgitation.  - Left atrium: The atrium was mildly dilated.  - Atrial septum: No defect or patent foramen ovale was identified.   Carotid artery ultrasound 11/07/2016: IMPRESSION: Minimal plaque in the carotid arteries. No significant carotid artery stenosis. Patent vertebral arteries.  RLE doppler ultrasound 10/07/19: No DVT  Recent Labs: 02/18/2021: ALT 17; BUN 27; Creatinine, Ser 0.73; Hemoglobin 13.4; Platelets 251.0; Potassium 4.1; Sodium 140   Recent Lipid Panel Lab Results  Component Value Date/Time   CHOL 131 02/18/2021 10:05 AM   TRIG 132.0 02/18/2021 10:05 AM   HDL 32.40 (L) 02/18/2021 10:05 AM   CHOLHDL 4 02/18/2021 10:05 AM   LDLCALC 72 02/18/2021 10:05 AM   LDLCALC 64 03/26/2020 12:59 PM   LDLDIRECT 152.0 06/25/2019 03:36 PM    Physical Exam:    VS:  BP 132/80    Pulse 64    Ht 4\' 11"  (1.499 m)    Wt 116 lb (52.6 kg)    SpO2 98%    BMI 23.43 kg/m     Wt Readings from Last 3 Encounters:  07/30/21 116 lb (52.6 kg)  07/03/21 111 lb (50.3 kg)  06/02/21 115 lb (52.2 kg)   GEN:  Well nourished, well developed in no acute distress HEENT: Normal NECK: No JVD; No carotid bruits CARDIAC: RRR,  2/6 systolic murmur. No rubs or gallops RESPIRATORY:  Clear to auscultation without rales, wheezing or rhonchi  ABDOMEN: Soft, non-tender, non-distended MUSCULOSKELETAL:  No edema; No deformity  SKIN: Warm and dry NEUROLOGIC:  Alert and oriented x 3 PSYCHIATRIC:  Normal affect   ASSESSMENT & PLAN:    PAD (peripheral artery disease) (HCC)  Hyperlipidemia, unspecified hyperlipidemia type  Mild mitral regurgitation  Aortic valve insufficiency, etiology of cardiac valve disease unspecified  Essential hypertension  Prediabetes  Palpitations   #PAD: -Symptomatic with RLE pain after 10 min of walking -ABI 0.81 on right and 0.91 on left -LE arterial dopplers with significant stenosis in the right SFA and moderate disease in left SFA -Recommended for  continued exercise and only revascularize if failing medical therapy -Follow-up with Dr. Garner Nash as scheduled   #Suspected palpitations: -Patient with episodes where she "catches her breath" like her heart "skips a beat" -Likely PVCs at night when she lays down; no symptoms while awake during the day and no exertional symptoms -Recommended PO mag -Change metop to coreg 3.125mg  BID -If no improvement, can check a cardiac monitor  #Atypical Chest Pain: -Had one episode of stabbing chest pain that woke her up 3 months ago -No exertional chest pain -Myoview 04/2021 without ischemia/infarction -TTE with normal EF, grade I DD, trivial MR, trivial AI  #Mild Aortic Regurgitation: #Mild Mitral Regurgitation: -TTE on 03/2021 showed trivial MR and AI -Continue serial monitoring  #Essential Hypertension: -Mildly elevated mainly high 120s-140s at home -Continue amlopdipine and HCTZ -Change metop to coreg 3.125mg  BID   #Hyperlipidemia: -LDL 72 in 02/2021; repeat pending -Continue crestor 20mg  per PCP  #Prediabetes: -HgA1C 6.12 -Continue lifestyle modifications including diet rich in fruit, vegetables and lean proteins as well as regular exercise with goal 14minutes of moderate exercise weekly -Follows regularly with PCP   Medication Adjustments/Labs and Tests Ordered: Current medicines are reviewed at length with the patient today.  Concerns regarding medicines are outlined above.  Tests Ordered: No orders of the defined types were placed in this encounter.  Medication Changes: Meds ordered this encounter  Medications   carvedilol (COREG) 3.125 MG tablet    Sig: Take 1 tablet (3.125 mg total) by mouth 2 (two) times daily.    Dispense:  180 tablet    Refill:  3   Magnesium 200 MG TABS    Sig: Take 1 tablet (200 mg total) by mouth at bedtime.    Dispense:  30 tablet    Refill:  11   I,Mykaella Javier,acting as a scribe for Freada Bergeron, MD.,have documented all relevant  documentation on the behalf of Freada Bergeron, MD,as directed by  Freada Bergeron, MD while in the presence of Freada Bergeron, MD.  I, Freada Bergeron, MD, have reviewed all documentation for this visit. The documentation on 07/30/21 for the exam, diagnosis, procedures, and orders are all accurate and complete.   Signed, Freada Bergeron, MD  07/30/2021 11:31 AM    [facility] [address], [place], [place]  [address] Phone: [phone]; Fax: [phone]

## 2021-07-30 NOTE — Patient Instructions (Signed)
Medication Instructions:   STOP TAKING METOPROLOL NOW  START TAKING MAGNESIUM 200 MG BY MOUTH AT BEDTIME  START TAKING CARVEDILOL (COREG) 3.125 MG BY MOUTH TWICE DAILY  *If you need a refill on your cardiac medications before your next appointment, please call your pharmacy*    Follow-Up: At [facility], you and your health needs are our priority.  As part of our continuing mission to provide you with exceptional heart care, we have created designated Provider Care Teams.  These Care Teams include your primary Cardiologist (physician) and Advanced Practice Providers (APPs -  Physician Assistants and Nurse Practitioners) who all work together to provide you with the care you need, when you need it.  We recommend signing up for the patient portal called "MyChart".  Sign up information is provided on this After Visit Summary.  MyChart is used to connect with patients for Virtual Visits (Telemedicine).  Patients are able to view lab/test results, encounter notes, upcoming appointments, etc.  Non-urgent messages can be sent to your provider as well.   To learn more about what you can do with MyChart, go to [email or web address].    Your next appointment:   6 month(s)  The format for your next appointment:   In Person  Provider:   [address] PA-C, DAYNA DUNN PA-C, MICHELE LENZE PA-C, MICHELLE Orwell, [place] Cecilie Kicks NP DR. P[place]BERTON

## 2021-08-01 DIAGNOSIS — I499 Cardiac arrhythmia, unspecified: Secondary | ICD-10-CM | POA: Diagnosis not present

## 2021-08-01 DIAGNOSIS — E785 Hyperlipidemia, unspecified: Secondary | ICD-10-CM | POA: Diagnosis not present

## 2021-08-01 DIAGNOSIS — Z7982 Long term (current) use of aspirin: Secondary | ICD-10-CM | POA: Diagnosis not present

## 2021-08-01 DIAGNOSIS — M1990 Unspecified osteoarthritis, unspecified site: Secondary | ICD-10-CM | POA: Diagnosis not present

## 2021-08-01 DIAGNOSIS — I739 Peripheral vascular disease, unspecified: Secondary | ICD-10-CM | POA: Diagnosis not present

## 2021-08-01 DIAGNOSIS — I10 Essential (primary) hypertension: Secondary | ICD-10-CM | POA: Diagnosis not present

## 2021-08-01 DIAGNOSIS — I209 Angina pectoris, unspecified: Secondary | ICD-10-CM | POA: Diagnosis not present

## 2021-08-01 DIAGNOSIS — R609 Edema, unspecified: Secondary | ICD-10-CM | POA: Diagnosis not present

## 2021-08-07 ENCOUNTER — Encounter: Payer: Self-pay | Admitting: Obstetrics and Gynecology

## 2021-08-07 ENCOUNTER — Ambulatory Visit (INDEPENDENT_AMBULATORY_CARE_PROVIDER_SITE_OTHER): Payer: Medicare PPO | Admitting: Obstetrics and Gynecology

## 2021-08-07 ENCOUNTER — Other Ambulatory Visit: Payer: Self-pay

## 2021-08-07 VITALS — BP 124/80 | HR 95 | Ht 59.0 in | Wt 113.0 lb

## 2021-08-07 DIAGNOSIS — N812 Incomplete uterovaginal prolapse: Secondary | ICD-10-CM | POA: Diagnosis not present

## 2021-08-07 DIAGNOSIS — N8110 Cystocele, unspecified: Secondary | ICD-10-CM | POA: Diagnosis not present

## 2021-08-07 DIAGNOSIS — N821 Other female urinary-genital tract fistulae: Secondary | ICD-10-CM

## 2021-08-07 NOTE — Progress Notes (Signed)
[facility]   Subjective:     Chief Complaint:  Chief Complaint  Patient presents with   Follow-up    pessary   History of Present Illness: DALMA PANCHAL is a 76 y.o. female with stage II pelvic organ prolapse who presents for a pessary check. She is using a size #2 ring with support pessary. The pessary has been working ok- sometimes if she is walking she will still feel the bulge around it and feel uncomfortable. She tried to remove it herself but could not find it.   Past Medical History: Patient  has a past medical history of Allergic rhinitis, Arthralgia (09/30/2015), Bronchitis, acute (01/09/2017), Chronic shoulder pain (12/07/2016), Epistaxis (09/30/2015), Globus sensation (02/23/2015), Heart murmur (12/07/2016), HTN (hypertension), Left hip pain (09/30/2015), Lexiscan Myoview 04/2021, Medicare annual wellness visit, subsequent (04/14/2015), Pain in the wrist, unspecified laterality (03/03/2014), and Tachycardia.   Past Surgical History: She  has a past surgical history that includes Bunionectomy; Appendectomy (76 yrs old); and Wisdom tooth extraction (76 yrs old).   Medications: She has a current medication list which includes the following prescription(s): aspirin ec, carvedilol, vitamin d3, collagen, hydrochlorothiazide, lisinopril, magnesium, omega-3 acid ethyl esters, rosuvastatin, vitamin b-12, and albuterol.   Allergies: Patient is allergic to fenofibrate and penicillins.   Social History: Patient  reports that she has never smoked. She has never used smokeless tobacco. She reports current alcohol use. She reports that she does not use drugs.      Objective:    Physical Exam: BP 124/80    Pulse 95    Ht 4\' 11"  (1.499 m)    Wt 113 lb (51.3 kg)    BMI 22.82 kg/m  Gen: No apparent distress, A&O x 3. Detailed Urogynecologic Evaluation:  Pelvic Exam: Normal external female genitalia; Bartholin's and Skene's glands normal in appearance; urethral meatus  normal in appearance, no urethral masses or discharge. The pessary was noted to be in place. It was removed and cleaned. Speculum exam revealed no lesions in the vagina.   She was then fit with a 2in ss gellhorn pessary. It was comfortable, fit well, and stayed in placed with strong cough, valsalva and bending. Lot # Exp 05/19/26  POP-Q (06/02/21):    POP-Q   0                                            Aa   0                                           Ba   -4                                              C    3.5                                            Gh   3  Pb   7                                            tvl    -3                                            Ap   -3                                            Bp   -5.5                                              D         Assessment/Plan:    Assessment: Ms. Witter is a 76 y.o. with stage II pelvic organ prolapse here for a pessary check. She is doing well.  Plan: - Fit with a 2in ss gellhorn pessary today. She will keep the pessary in place until next visit.  - trimosan gel was provided for vaginal moisture and to help with vaginal discharge - She will follow-up in 2-3 weeks for a pessary check or sooner as needed.   All questions were answered.  [name], MD

## 2021-08-19 NOTE — Progress Notes (Signed)
Chief Complaint  Patient presents with   Joint Pain   Palpitations   HPI: [name] is a 76 y.o. female, who is here today with above concerns and to discuss surgery and other concerns. She was last seen on 05/13/21.  Since her last visit she has been evaluated by gyn urology for vaginal prolapse. She did not tolerate pessary x 2, 2nd one caused urinary obstruction and difficulty passing stool during defecation. She is wonders if she should pursue surgical treatment.  Urinary frequency with small amount at the time. Vaginal fullness sensation with exercise and prolonged walking/standing as well as with straining. Negative for gross hematuria,dysuria,or vaginal bleeding.  PAD: Following with vascular. She is still having right calf pain at rest and worse with walking,it has improved. No associated edema or erythema.  She is exercising regularly, walking the tread mill a few times per week  and eating less. She has noted wt loss. Wonders if this problem ever going to resolve and how severe it is.  Last ABI in 03/2021 showed Right: Resting right ankle-brachial index indicates mild right lower extremity arterial disease. The right toe-brachial index is abnormal.  Left: Resting left ankle-brachial index indicates mild left lower extremity arterial disease. The left toe-brachial index is abnormal.  04/29/21 LE arterial duplex abnormal bilateral. She is on Rosuvastatin 20 mg daily. She is also on Lovaza 1 g bid. . Lab Results  Component Value Date   CHOL 103 07/30/2021   HDL 32 (L) 07/30/2021   LDLCALC 54 07/30/2021   LDLDIRECT 152.0 06/25/2019   TRIG 84 07/30/2021   CHOLHDL 3.2 07/30/2021   Reports episodes when in bed at night, she has difficulty describing. It feels like trying to catch ones breath when sobbing/crying,like throat contractions. No associated CP,palpitations,or diaphoresis.  States that she discussed this with her cardiologist. Metoprolol changed to  Carvedilol 3.125 mg bid. Her BP is low today. She is also on lisinopril 10 mg daily and HCTZ 25 mg daily. She is not checking BP at home. Negative for visual changes,dyspnea, focal weakness, or edema.  -Intermittent great toe numbness for the past 3 months. No associated edema,erythema,or weakness. It has been stable. No hx of trauma.  She has questions about B12 and vit D supplementation. She takes B12 1000 mcg daily and Vit D 1000 U daily. Takes Biotin to help with hair loss.  Lab Results  Component Value Date   [id number] 03/29/2011   Prediabetes: Negative for abdominal pain, nausea,vomiting, polydipsia,polyuria, or polyphagia. Lab Results  Component Value Date   HGBA1C 6.2 02/18/2021   IP joint pain, no edema or erythema. Exacerbated by repetitive manual activities. Problem has been going on for a while.  Review of Systems  Constitutional:  Negative for activity change, appetite change and fever.  HENT:  Negative for mouth sores, nosebleeds and sore throat.   Eyes:  Negative for redness and visual disturbance.  Respiratory:  Negative for cough and wheezing.   Gastrointestinal:        Negative for changes in bowel habits.  Endocrine: Negative for cold intolerance and heat intolerance.  Genitourinary:  Positive for frequency. Negative for decreased urine volume, dysuria and hematuria.  Neurological:  Negative for syncope and facial asymmetry.  Rest see pertinent positives and negatives per HPI.  Current Outpatient Medications on File Prior to Visit  Medication Sig Dispense Refill   aspirin EC 81 MG tablet Take 81 mg by mouth daily. Swallow whole.     carvedilol (COREG)  3.125 MG tablet Take 1 tablet (3.125 mg total) by mouth 2 (two) times daily. 180 tablet 3   Cholecalciferol (VITAMIN D3) 25 MCG (1000 UT) CAPS Take 1 capsule by mouth daily.     COLLAGEN PO Take by mouth daily.     hydrochlorothiazide (HYDRODIURIL) 25 MG tablet TAKE ONE TABLET BY MOUTH ONE TIME DAILY  90 tablet 2   lisinopril (ZESTRIL) 10 MG tablet Take 2 tablets (20 mg total) by mouth daily. 180 tablet 2   Magnesium 200 MG TABS Take 1 tablet (200 mg total) by mouth at bedtime. 30 tablet 11   omega-3 acid ethyl esters (LOVAZA) 1 g capsule Take 1 capsule (1 g total) by mouth 2 (two) times daily. 180 capsule 3   rosuvastatin (CRESTOR) 20 MG tablet Take 1 tablet (20 mg total) by mouth daily. 90 tablet 3   vitamin B-12 (CYANOCOBALAMIN) 1000 MCG tablet Take 1,000 mcg by mouth daily.     albuterol (VENTOLIN HFA) 108 (90 Base) MCG/ACT inhaler Inhale 2 puffs into the lungs every 6 (six) hours as needed for wheezing or shortness of breath. 6.7 g 0   No current facility-administered medications on file prior to visit.    Past Medical History:  Diagnosis Date   Allergic rhinitis    Arthralgia 09/30/2015   Bronchitis, acute 01/09/2017   Chronic shoulder pain 12/07/2016   Epistaxis 09/30/2015   Globus sensation 02/23/2015   Heart murmur 12/07/2016   HTN (hypertension)    Left hip pain 09/30/2015   Lexiscan Myoview 04/2021    Myoview 10/22: EF 78, no ischemia or infarction; low risk   Medicare annual wellness visit, subsequent 04/14/2015   Pain in the wrist, unspecified laterality 03/03/2014   Tachycardia    Episodic   Allergies  Allergen Reactions   Fenofibrate Other (See Comments)    Abdominal pain   Penicillins Rash    REACTION: Hives Anaphylaxis    Social History   Socioeconomic History   Marital status: Married    Spouse name: Not on file   Number of children: 1   Years of education: Not on file   Highest education level: Not on file  Occupational History   Occupation: Homemaker    Employer: RETIRED  Tobacco Use   Smoking status: Never   Smokeless tobacco: Never  Vaping Use   Vaping Use: Never used  Substance and Sexual Activity   Alcohol use: Yes    Comment:  occ   Drug use: No   Sexual activity: Not Currently    Comment: lives with husband  Other Topics Concern    Not on file  Social History Narrative   University - [place], Masters Degree - counseling, [employer]   Married '69 - 93 years, divorced; married '82   21 daughter - '72; 2 grandchildren      Regular Exercise -  YES         Social Determinants of Health   Financial Resource Strain: Low Risk    Difficulty of Paying Living Expenses: Not hard at all  Food Insecurity: No Food Insecurity   Worried About Charity fundraiser in the Last Year: Never true   Ran Out of Food in the Last Year: Never true  Transportation Needs: No Transportation Needs   Lack of Transportation (Medical): No   Lack of Transportation (Non-Medical): No  Physical Activity: Insufficiently Active   Days of Exercise per Week: 2 days   Minutes of Exercise per Session: 30 min  Stress: No Stress Concern Present   Feeling of Stress : Not at all  Social Connections: Moderately Isolated   Frequency of Communication with Friends and Family: Three times a week   Frequency of Social Gatherings with Friends and Family: Three times a week   Attends Religious Services: Never   Active Member of Clubs or Organizations: No   Attends Archivist Meetings: Never   Marital Status: Married   Vitals:   08/21/21 1439  BP: (!) 96/54  Pulse: 71  Resp: 16  Temp: 98.7 F (37.1 C)  SpO2: 96%   Body mass index is 23.39 kg/m.  Physical Exam Vitals and nursing note reviewed.  Constitutional:      General: She is not in acute distress.    Appearance: She is well-developed.  HENT:     Head: Normocephalic and atraumatic.     Mouth/Throat:     Mouth: Mucous membranes are moist.     Pharynx: Oropharynx is clear.  Eyes:     Conjunctiva/sclera: Conjunctivae normal.  Cardiovascular:     Rate and Rhythm: Normal rate and regular rhythm.     Pulses:          Dorsalis pedis pulses are 1+ on the right side and 2+ on the left side.     Heart sounds: No murmur heard. Pulmonary:     Effort: Pulmonary effort is  normal. No respiratory distress.     Breath sounds: Normal breath sounds.  Abdominal:     Palpations: Abdomen is soft. There is no hepatomegaly or mass.     Tenderness: There is no abdominal tenderness.  Musculoskeletal:     Right hand: No tenderness. Normal pulse.     Left hand: No tenderness. Normal pulse.     Comments: Bilateral Heberden's node and Bouchard's nodes (PIP). No signs of synovitis.   Lymphadenopathy:     Cervical: No cervical adenopathy.  Skin:    General: Skin is warm.     Findings: No erythema or rash.  Neurological:     General: No focal deficit present.     Mental Status: She is alert and oriented to person, place, and time.     Cranial Nerves: No cranial nerve deficit.     Gait: Gait normal.  Psychiatric:     Comments: Well groomed, good eye contact.   ASSESSMENT AND PLAN:  [name] was seen today for joint pain and palpitations.  Diagnoses and all orders for this visit: Orders Placed This Encounter  Procedures   Vitamin B12   Hemoglobin [id number]   Basic metabolic panel   TSH    Osteoarthritis of both hands, unspecified osteoarthritis type We discussed Dx,prognosis,and treatment options. Tylenol 500 mg 3-4 times daily prn. We could consider duloxetine in the future.  Prediabetes Continue a healthy life style for diabetes prevention. Further recommendations according to HgA1C result.  PAD (peripheral artery disease) (HCC) We discussed Dx and prognosis. Right calf pain has improved. Continue Rosuvastatin 20 mg and Aspirin 81 mg daily.  Essential hypertension BP low today,asymptomatic. For now continue Carvedilol,Lisinopril,and HCTZ same dose. Instructed to monitor BP at home and to let me know about readings. Continue low salt   Numbness of toes We discussed possible causes. Appropriate foot care. Further recommendations according to lab results.  Prolapse of anterior vaginal wall Problem is affecting her quality of life, she cannot  exercise because it exacerbate symptoms. Pessary did not help, so I think she should consider surgical treatment.  We discussed  current recommendations in regard to Vit D supplementation, 800 U, she can continue 1000 U daily. B12 2000 mcg weekly recommended instead 1000 mcg daily given the fact she has no hx of b12 deficiency. Biotin can be continued.  I spent a total of 55 minutes in both face to face and non face to face activities for this visit on the date of this encounter. During this time history was obtained and documented, examination was performed, prior labs/imaging reviewed, and assessment/plan discussed.  Return in about 6 months (around 02/18/2022).  Adysen Raphael G. Martinique, MD  [facility]. [place] office.

## 2021-08-21 ENCOUNTER — Encounter: Payer: Self-pay | Admitting: Family Medicine

## 2021-08-21 ENCOUNTER — Ambulatory Visit: Payer: Medicare PPO | Admitting: Family Medicine

## 2021-08-21 VITALS — BP 96/54 | HR 71 | Temp 98.7°F | Resp 16 | Ht 59.0 in | Wt 115.8 lb

## 2021-08-21 DIAGNOSIS — I10 Essential (primary) hypertension: Secondary | ICD-10-CM

## 2021-08-21 DIAGNOSIS — I739 Peripheral vascular disease, unspecified: Secondary | ICD-10-CM

## 2021-08-21 DIAGNOSIS — M19042 Primary osteoarthritis, left hand: Secondary | ICD-10-CM | POA: Diagnosis not present

## 2021-08-21 DIAGNOSIS — M19041 Primary osteoarthritis, right hand: Secondary | ICD-10-CM | POA: Diagnosis not present

## 2021-08-21 DIAGNOSIS — R200 Anesthesia of skin: Secondary | ICD-10-CM | POA: Diagnosis not present

## 2021-08-21 DIAGNOSIS — N8110 Cystocele, unspecified: Secondary | ICD-10-CM | POA: Diagnosis not present

## 2021-08-21 DIAGNOSIS — R7303 Prediabetes: Secondary | ICD-10-CM | POA: Diagnosis not present

## 2021-08-21 NOTE — Patient Instructions (Addendum)
A few things to remember from today's visit:  Prediabetes - Plan: Hemoglobin A1c  PAD (peripheral artery disease) (HCC)  Essential hypertension - Plan: Basic metabolic panel  Numbness of toes - Plan: Vitamin B12  Osteoarthritis of both hands, unspecified osteoarthritis type  If you need refills please call your pharmacy. Do not use My Chart to request refills or for acute issues that need immediate attention.   Please be sure medication list is accurate. If a new problem present, please set up appointment sooner than planned today.  No changes today. B12 2000 mcg weekly.  Continue appropriate foot care. I recommend considering surgical treatment for prolapse, it seems to be affecting your quality of life.

## 2021-08-25 ENCOUNTER — Other Ambulatory Visit (INDEPENDENT_AMBULATORY_CARE_PROVIDER_SITE_OTHER): Payer: Medicare PPO

## 2021-08-25 DIAGNOSIS — I10 Essential (primary) hypertension: Secondary | ICD-10-CM

## 2021-08-25 DIAGNOSIS — R7303 Prediabetes: Secondary | ICD-10-CM | POA: Diagnosis not present

## 2021-08-25 DIAGNOSIS — R200 Anesthesia of skin: Secondary | ICD-10-CM

## 2021-08-25 LAB — BASIC METABOLIC PANEL
BUN: 21 mg/dL (ref 6–23)
CO2: 32 mEq/L (ref 19–32)
Calcium: 10.4 mg/dL (ref 8.4–10.5)
Chloride: 103 mEq/L (ref 96–112)
Creatinine, Ser: 0.65 mg/dL (ref 0.40–1.20)
GFR: 84.9 mL/min (ref >60.00)
Glucose, Bld: 134 mg/dL — ABNORMAL HIGH (ref 70–99)
Potassium: 4 mEq/L (ref 3.5–5.1)
Sodium: 140 mEq/L (ref 135–145)

## 2021-08-25 LAB — TSH: TSH: 2.28 u[IU]/mL (ref 0.35–5.50)

## 2021-08-25 LAB — HEMOGLOBIN A1C: Hgb A1c MFr Bld: 6 % (ref 4.6–6.5)

## 2021-08-25 LAB — VITAMIN B12: Vitamin B-12: >1504 pg/mL — ABNORMAL HIGH (ref 211–911)

## 2021-08-26 ENCOUNTER — Encounter: Payer: Self-pay | Admitting: Obstetrics and Gynecology

## 2021-08-26 ENCOUNTER — Other Ambulatory Visit (HOSPITAL_COMMUNITY)
Admission: RE | Admit: 2021-08-26 | Discharge: 2021-08-26 | Disposition: A | Discharge status: Home / Self Care | Payer: Medicare PPO | Source: Ambulatory Visit | Attending: Obstetrics and Gynecology | Admitting: Obstetrics and Gynecology

## 2021-08-26 ENCOUNTER — Ambulatory Visit: Payer: Medicare PPO | Admitting: Obstetrics and Gynecology

## 2021-08-26 ENCOUNTER — Other Ambulatory Visit: Payer: Self-pay

## 2021-08-26 VITALS — BP 127/75 | HR 69

## 2021-08-26 DIAGNOSIS — R300 Dysuria: Secondary | ICD-10-CM | POA: Insufficient documentation

## 2021-08-26 DIAGNOSIS — N8110 Cystocele, unspecified: Secondary | ICD-10-CM

## 2021-08-26 DIAGNOSIS — R350 Frequency of micturition: Secondary | ICD-10-CM | POA: Diagnosis not present

## 2021-08-26 DIAGNOSIS — N812 Incomplete uterovaginal prolapse: Secondary | ICD-10-CM | POA: Diagnosis not present

## 2021-08-26 LAB — POCT URINALYSIS DIPSTICK
Appearance: NORMAL
Bilirubin, UA: NEGATIVE
Glucose, UA: NEGATIVE
Ketones, UA: NEGATIVE
Nitrite, UA: NEGATIVE
Protein, UA: NEGATIVE
Spec Grav, UA: 1.015 (ref 1.010–1.025)
Urobilinogen, UA: 1 E.U./dL
pH, UA: 7 (ref 5.0–8.0)

## 2021-08-26 NOTE — Progress Notes (Signed)
[facility]   Subjective:     Chief Complaint:  Chief Complaint  Patient presents with   Pessary Check   History of Present Illness: Eileen Hansen is a 76 y.o. female with stage II pelvic organ prolapse who presents for a pessary check. She is using a size 2in ss gellhorn pessary.   The pessary caused her to not be able to urinate or to have a bowel movement after a few days of having it in. She removed it herself. She feels like it was too much and is not sure she wants another pessary.   Has been having more urgency and frequency and has to run to the bathroom recently. Also has some burning with urination. She has not had symptoms like this before.   Does not leak urine. Has never leaked urine with the pessary in place.   Past Medical History: Patient  has a past medical history of Allergic rhinitis, Arthralgia (09/30/2015), Bronchitis, acute (01/09/2017), Chronic shoulder pain (12/07/2016), Epistaxis (09/30/2015), Globus sensation (02/23/2015), Heart murmur (12/07/2016), HTN (hypertension), Left hip pain (09/30/2015), Lexiscan Myoview 04/2021, Medicare annual wellness visit, subsequent (04/14/2015), Pain in the wrist, unspecified laterality (03/03/2014), and Tachycardia.   Past Surgical History: She  has a past surgical history that includes Bunionectomy; Appendectomy (76 yrs old); and Wisdom tooth extraction (76 yrs old).   Medications: She has a current medication list which includes the following prescription(s): aspirin ec, carvedilol, vitamin d3, collagen, hydrochlorothiazide, lisinopril, magnesium, omega-3 acid ethyl esters, rosuvastatin, and vitamin b-12.   Allergies: Patient is allergic to fenofibrate and penicillins.   Social History: Patient  reports that she has never smoked. She has never used smokeless tobacco. She reports current alcohol use. She reports that she does not use drugs.      Objective:    Physical Exam: BP 127/75    Pulse 69  Gen:  No apparent distress, A&O x 3. Detailed Urogynecologic Evaluation:  Pelvic Exam: Normal external female genitalia; Bartholin's and Skene's glands normal in appearance; urethral meatus normal in appearance, no urethral masses or discharge.  Speculum exam revealed no lesions in the vagina.     POP-Q (06/02/21):    POP-Q   0                                            Aa   0                                           Ba   -4                                              C    3.5                                            Gh   3  Pb   7                                            tvl    -3                                            Ap   -3                                            Bp   -5.5                                              D     POC urine: trace leukocytes, trace blood    Assessment/Plan:    Assessment: Eileen Hansen is a 76 y.o. with stage II pelvic organ prolapse. Plan: - We discussed options for surgery including vaginal hysterectomy with uterosacral suspension vs sacrospinous hysteropexy, both with anterior repair. She prefers a less invasive surgery so will plan for: Sacrospinous hysteropexy, anterior repair, cystoscopy. Handout provided about the surgery.  - Has not leaked urine with or without a pessary in place so no need for urodynamic testing.  - Will have her return for pre op visit once she has a surgery date scheduled.  - Will send urine today for culture  All questions were answered.  [name], MD    Time spent: I spent 25 minutes dedicated to the care of this patient on the date of this encounter to include pre-visit review of records, face-to-face time with the patient and post visit documentation and ordering medication/ testing.

## 2021-08-26 NOTE — Addendum Note (Signed)
Addended by: Elita Quick on: 08/26/2021 01:12 PM   Modules accepted: Orders

## 2021-08-28 LAB — URINE CULTURE: Culture: NO GROWTH

## 2021-09-04 ENCOUNTER — Ambulatory Visit (INDEPENDENT_AMBULATORY_CARE_PROVIDER_SITE_OTHER): Payer: Medicare PPO | Admitting: Obstetrics and Gynecology

## 2021-09-04 ENCOUNTER — Other Ambulatory Visit: Payer: Self-pay

## 2021-09-04 ENCOUNTER — Encounter: Payer: Self-pay | Admitting: Obstetrics and Gynecology

## 2021-09-04 VITALS — BP 146/79 | HR 74

## 2021-09-04 DIAGNOSIS — N8110 Cystocele, unspecified: Secondary | ICD-10-CM

## 2021-09-04 DIAGNOSIS — N812 Incomplete uterovaginal prolapse: Secondary | ICD-10-CM

## 2021-09-04 NOTE — Progress Notes (Signed)
[facility] Pre-Operative visit  Subjective Chief Complaint: Eileen Hansen presents for a preoperative encounter.   History of Present Illness: Eileen Hansen is a 76 y.o. female who presents for preoperative visit.  She is scheduled to undergo : Sacrospinous hysteropexy, anterior repair, cystoscopy on 08/25/21.  Her symptoms include vaginal bulge, and she was was found to have Stage II anterior, Stage I posterior, Stage I apical prolapse .  Did not undergo urodynamics- has never leaked urine with or without pessary in place.   Past Medical History:  Diagnosis Date   Allergic rhinitis    Arthralgia 09/30/2015   Bronchitis, acute 01/09/2017   Chronic shoulder pain 12/07/2016   Epistaxis 09/30/2015   Globus sensation 02/23/2015   Heart murmur 12/07/2016   HTN (hypertension)    Left hip pain 09/30/2015   Lexiscan Myoview 04/2021    Myoview 10/22: EF 78, no ischemia or infarction; low risk   Medicare annual wellness visit, subsequent 04/14/2015   Pain in the wrist, unspecified laterality 03/03/2014   Tachycardia    Episodic     Past Surgical History:  Procedure Laterality Date   APPENDECTOMY  76 yrs old   BUNIONECTOMY     WISDOM TOOTH EXTRACTION  76 yrs old    is allergic to fenofibrate and penicillins.   Family History  Problem Relation Age of Onset   Cancer Father        melanoma   Diabetes Mother    Hypertension Mother    Hyperlipidemia Mother    Stroke Mother    Heart attack Mother    Heart attack Brother    Cancer Brother        pancreatic   Colon cancer Neg Hx    Breast cancer Neg Hx    Coronary artery disease Neg Hx     Social History   Tobacco Use   Smoking status: Never   Smokeless tobacco: Never  Vaping Use   Vaping Use: Never used  Substance Use Topics   Alcohol use: Yes    Comment:  occ   Drug use: No     Review of Systems was negative for a full 10 system review except as noted in the History of Present Illness.   Current  Outpatient Medications:    aspirin EC 81 MG tablet, Take 81 mg by mouth daily. Swallow whole., Disp: , Rfl:    carvedilol (COREG) 3.125 MG tablet, Take 1 tablet (3.125 mg total) by mouth 2 (two) times daily., Disp: 180 tablet, Rfl: 3   Cholecalciferol (VITAMIN D3) 25 MCG (1000 UT) CAPS, Take 1 capsule by mouth daily., Disp: , Rfl:    COLLAGEN PO, Take by mouth daily., Disp: , Rfl:    hydrochlorothiazide (HYDRODIURIL) 25 MG tablet, TAKE ONE TABLET BY MOUTH ONE TIME DAILY, Disp: 90 tablet, Rfl: 2   lisinopril (ZESTRIL) 10 MG tablet, Take 2 tablets (20 mg total) by mouth daily., Disp: 180 tablet, Rfl: 2   Magnesium 200 MG TABS, Take 1 tablet (200 mg total) by mouth at bedtime., Disp: 30 tablet, Rfl: 11   omega-3 acid ethyl esters (LOVAZA) 1 g capsule, Take 1 capsule (1 g total) by mouth 2 (two) times daily., Disp: 180 capsule, Rfl: 3   rosuvastatin (CRESTOR) 20 MG tablet, Take 1 tablet (20 mg total) by mouth daily., Disp: 90 tablet, Rfl: 3   vitamin B-12 (CYANOCOBALAMIN) 1000 MCG tablet, Take 1,000 mcg by mouth daily., Disp: , Rfl:    Objective Vitals:  09/04/21 1337  BP: (!) 146/79  Pulse: 74    Gen: NAD CV: S1 S2 RRR Lungs: Clear to auscultation bilaterally Abd: soft, nontender   Previous Pelvic Exam showed: POP-Q (06/02/21):    POP-Q   0                                            Aa   0                                           Ba   -4                                              C    3.5                                            Gh   3                                            Pb   7                                            tvl    -3                                            Ap   -3                                            Bp   -5.5                                              D       Assessment/ Plan  Assessment: The patient is a 76 y.o. year old scheduled to undergo Sacrospinous hysteropexy, anterior repair, cystoscopy. Verbal consent was obtained  for these procedures.  Plan: General Surgical Consent: The patient has previously been counseled on alternative treatments, and the decision by the patient and provider was to proceed with the procedure listed above.  For all procedures, there are risks of bleeding, infection, damage to surrounding organs including but not limited to bowel, bladder, blood vessels, ureters and nerves, and need for further surgery if an injury were to occur. These risks are all low with minimally invasive surgery.   There are risks of numbness and weakness at any body site or buttock/rectal pain.  It is possible that baseline pain can  be worsened by surgery, either with or without mesh. If surgery is vaginal, there is also a low risk of possible conversion to laparoscopy or open abdominal incision where indicated. Very rare risks include blood transfusion, blood clot, heart attack, pneumonia, or death.   There is also a risk of short-term postoperative urinary retention with need to use a catheter. About half of patients need to go home from surgery with a catheter, which is then later removed in the office. The risk of long-term need for a catheter is very low. There is also a risk of worsening of overactive bladder.     Prolapse (with or without mesh): Risk factors for surgical failure  include things that put pressure on your pelvis and the surgical repair, including obesity, chronic cough, and heavy lifting or straining (including lifting children or adults, straining on the toilet, or lifting heavy objects such as furniture or anything weighing >25 lbs. Risks of recurrence is 20-30% with vaginal native tissue repair and a less than 10% with sacrocolpopexy with mesh.    We discussed consent for blood products. Risks for blood transfusion include allergic reactions, other reactions that can affect different body organs and managed accordingly, transmission of infectious diseases such as HIV or Hepatitis. However,  the blood is screened. Patient consents for blood products.  Pre-operative instructions:  She was instructed to not take Aspirin/NSAIDs x 7days prior to surgery.  Antibiotic prophylaxis was ordered as indicated.  Cathter use: Patient will go home with foley if needed after post-operative voiding trial.  Post-operative instructions:  She was provided with specific post-operative instructions, including precautions and signs/symptoms for which we would recommend contacting us, in addition to daytime and after-hours contact phone numbers. This was provided on a handout.   Post-operative medications: Prescriptions for motrin, tylenol, miralax, and oxycodone were sent to her pharmacy. Discussed using ibuprofen and tylenol on a schedule to limit use of narcotics.   Laboratory testing:  No labs needed   Preoperative clearance:  She does not require surgical clearance.    Post-operative follow-up:  A post-operative appointment will be made for 6 weeks from the date of surgery. If she needs a post-operative nurse visit for a voiding trial, that will be set up after she leaves the hospital.    Patient will call the clinic or use MyChart should anything change or any new issues arise.   [name], MD

## 2021-09-07 ENCOUNTER — Encounter: Payer: Self-pay | Admitting: Obstetrics and Gynecology

## 2021-09-07 MED ORDER — POLYETHYLENE GLYCOL 3350 17 GM/SCOOP PO POWD: 17.0000 g | Freq: Every day | ORAL | 0 refills | Status: DC

## 2021-09-07 MED ORDER — OXYCODONE HCL 5 MG PO TABS: 5.0000 mg | ORAL_TABLET | ORAL | 0 refills | Status: DC | PRN

## 2021-09-07 MED ORDER — ACETAMINOPHEN 500 MG PO TABS: 500.0000 mg | ORAL_TABLET | Freq: Four times a day (QID) | ORAL | 0 refills | Status: DC | PRN

## 2021-09-07 MED ORDER — IBUPROFEN 600 MG PO TABS: 600.0000 mg | ORAL_TABLET | Freq: Four times a day (QID) | ORAL | 0 refills | Status: DC | PRN

## 2021-09-07 NOTE — H&P (Signed)
[facility] Pre-Operative H&P  Subjective Chief Complaint: ELIYAH WIEBERS presents for a preoperative encounter.   History of Present Illness: DESTINI TOKARZ is a 76 y.o. female who presents for preoperative visit.  She is scheduled to undergo : Sacrospinous hysteropexy, anterior repair, cystoscopy on 08/25/21.  Her symptoms include vaginal bulge, and she was was found to have Stage II anterior, Stage I posterior, Stage I apical prolapse .  Did not undergo urodynamics- has never leaked urine with or without pessary in place.   Past Medical History:  Diagnosis Date   Allergic rhinitis    Arthralgia 09/30/2015   Bronchitis, acute 01/09/2017   Chronic shoulder pain 12/07/2016   Epistaxis 09/30/2015   Globus sensation 02/23/2015   Heart murmur 12/07/2016   HTN (hypertension)    Left hip pain 09/30/2015   Lexiscan Myoview 04/2021    Myoview 10/22: EF 78, no ischemia or infarction; low risk   Medicare annual wellness visit, subsequent 04/14/2015   Pain in the wrist, unspecified laterality 03/03/2014   Tachycardia    Episodic     Past Surgical History:  Procedure Laterality Date   APPENDECTOMY  76 yrs old   BUNIONECTOMY     WISDOM TOOTH EXTRACTION  76 yrs old    is allergic to fenofibrate and penicillins.   Family History  Problem Relation Age of Onset   Cancer Father        melanoma   Diabetes Mother    Hypertension Mother    Hyperlipidemia Mother    Stroke Mother    Heart attack Mother    Heart attack Brother    Cancer Brother        pancreatic   Colon cancer Neg Hx    Breast cancer Neg Hx    Coronary artery disease Neg Hx     Social History   Tobacco Use   Smoking status: Never   Smokeless tobacco: Never  Vaping Use   Vaping Use: Never used  Substance Use Topics   Alcohol use: Yes    Comment:  occ   Drug use: No     Review of Systems was negative for a full 10 system review except as noted in the History of Present Illness.  No current  facility-administered medications for this encounter.  Current Outpatient Medications:    acetaminophen (TYLENOL) 500 MG tablet, Take 1 tablet (500 mg total) by mouth every 6 (six) hours as needed (pain)., Disp: 30 tablet, Rfl: 0   aspirin EC 81 MG tablet, Take 81 mg by mouth daily. Swallow whole., Disp: , Rfl:    carvedilol (COREG) 3.125 MG tablet, Take 1 tablet (3.125 mg total) by mouth 2 (two) times daily., Disp: 180 tablet, Rfl: 3   Cholecalciferol (VITAMIN D3) 25 MCG (1000 UT) CAPS, Take 1 capsule by mouth daily., Disp: , Rfl:    COLLAGEN PO, Take by mouth daily., Disp: , Rfl:    hydrochlorothiazide (HYDRODIURIL) 25 MG tablet, TAKE ONE TABLET BY MOUTH ONE TIME DAILY, Disp: 90 tablet, Rfl: 2   ibuprofen (ADVIL) 600 MG tablet, Take 1 tablet (600 mg total) by mouth every 6 (six) hours as needed., Disp: 30 tablet, Rfl: 0   lisinopril (ZESTRIL) 10 MG tablet, Take 2 tablets (20 mg total) by mouth daily., Disp: 180 tablet, Rfl: 2   Magnesium 200 MG TABS, Take 1 tablet (200 mg total) by mouth at bedtime., Disp: 30 tablet, Rfl: 11   omega-3 acid ethyl esters (LOVAZA) 1 g capsule, Take  1 capsule (1 g total) by mouth 2 (two) times daily., Disp: 180 capsule, Rfl: 3   oxyCODONE (OXY IR/ROXICODONE) 5 MG immediate release tablet, Take 1 tablet (5 mg total) by mouth every 4 (four) hours as needed for severe pain., Disp: 10 tablet, Rfl: 0   polyethylene glycol powder (GLYCOLAX/MIRALAX) 17 GM/SCOOP powder, Take 17 g by mouth daily. Drink 17g (1 scoop) dissolved in water per day., Disp: 255 g, Rfl: 0   rosuvastatin (CRESTOR) 20 MG tablet, Take 1 tablet (20 mg total) by mouth daily., Disp: 90 tablet, Rfl: 3   vitamin B-12 (CYANOCOBALAMIN) 1000 MCG tablet, Take 1,000 mcg by mouth daily., Disp: , Rfl:    Objective There were no vitals filed for this visit.   Gen: NAD CV: S1 S2 RRR Lungs: Clear to auscultation bilaterally Abd: soft, nontender   Previous Pelvic Exam showed: POP-Q (06/02/21):    POP-Q    0                                            Aa   0                                           Ba   -4                                              C    3.5                                            Gh   3                                            Pb   7                                            tvl    -3                                            Ap   -3                                            Bp   -5.5                                              D       Assessment/ Plan  Assessment: The patient is a 76 y.o.  year old with stage II pelvic organ prolapse scheduled to undergo Sacrospinous hysteropexy, anterior repair, cystoscopy.   Jaquita Folds, MD

## 2021-09-11 ENCOUNTER — Other Ambulatory Visit: Payer: Self-pay

## 2021-09-11 ENCOUNTER — Encounter (HOSPITAL_BASED_OUTPATIENT_CLINIC_OR_DEPARTMENT_OTHER): Payer: Self-pay | Admitting: Obstetrics and Gynecology

## 2021-09-11 DIAGNOSIS — R233 Spontaneous ecchymoses: Secondary | ICD-10-CM

## 2021-09-11 HISTORY — DX: Spontaneous ecchymoses: R23.3

## 2021-09-11 NOTE — Progress Notes (Signed)
Spoke w/ via phone for pre-op interview---pt Lab needs dos----   none            Lab results------none COVID test -----patient states asymptomatic no test needed Arrive at -------1230 pm NPO after MN NO Solid Food.  Clear liquids from MN until---1130 am Med rec completed Medications to take morning of surgery -----rosuvastatin, carvedilol Diabetic medication -----n/a Patient instructed no nail polish to be worn day of surgery Patient instructed to bring photo id and insurance card day of surgery Patient aware to have Driver (ride ) / caregiver   daughter Melvenia Beam  for 24 hours after surgery  Patient Special Instructions -----none Pre-Op special Istructions -----none Patient verbalized understanding of instructions that were given at this phone interview. Patient denies shortness of breath, chest pain, fever, cough at this phone interview.   Ekg 04-28-2021 chart/epic Echo 04-06-2021 ef 60-65 % Lov dr Delia Chimes 07-30-2021 epic Lexiscan 04-30-2021 epic Carotid US 11-07-2016 Last dose 81 mg asa  will be 09-13-2021 per pt

## 2021-09-14 ENCOUNTER — Ambulatory Visit (HOSPITAL_BASED_OUTPATIENT_CLINIC_OR_DEPARTMENT_OTHER): Payer: Medicare PPO | Admitting: Anesthesiology

## 2021-09-14 ENCOUNTER — Encounter (HOSPITAL_BASED_OUTPATIENT_CLINIC_OR_DEPARTMENT_OTHER): Payer: Self-pay | Admitting: Obstetrics and Gynecology

## 2021-09-14 ENCOUNTER — Other Ambulatory Visit: Payer: Self-pay

## 2021-09-14 ENCOUNTER — Observation Stay (HOSPITAL_BASED_OUTPATIENT_CLINIC_OR_DEPARTMENT_OTHER)
Admission: RE | Admit: 2021-09-14 | Discharge: 2021-09-15 | Disposition: A | Discharge status: Home / Self Care | Payer: Medicare PPO | Attending: Obstetrics and Gynecology | Admitting: Obstetrics and Gynecology

## 2021-09-14 ENCOUNTER — Encounter (HOSPITAL_BASED_OUTPATIENT_CLINIC_OR_DEPARTMENT_OTHER)
Admission: RE | Disposition: A | Discharge status: Home / Self Care | Payer: Self-pay | Source: Home / Self Care | Attending: Obstetrics and Gynecology

## 2021-09-14 ENCOUNTER — Telehealth: Payer: Self-pay | Admitting: Obstetrics and Gynecology

## 2021-09-14 DIAGNOSIS — I10 Essential (primary) hypertension: Secondary | ICD-10-CM | POA: Diagnosis not present

## 2021-09-14 DIAGNOSIS — N812 Incomplete uterovaginal prolapse: Secondary | ICD-10-CM | POA: Diagnosis not present

## 2021-09-14 DIAGNOSIS — N8110 Cystocele, unspecified: Secondary | ICD-10-CM

## 2021-09-14 DIAGNOSIS — S3720XA Unspecified injury of bladder, initial encounter: Secondary | ICD-10-CM

## 2021-09-14 DIAGNOSIS — Z79899 Other long term (current) drug therapy: Secondary | ICD-10-CM | POA: Diagnosis not present

## 2021-09-14 DIAGNOSIS — Z7982 Long term (current) use of aspirin: Secondary | ICD-10-CM | POA: Insufficient documentation

## 2021-09-14 HISTORY — DX: Dorsalgia, unspecified: M54.9

## 2021-09-14 HISTORY — PX: CYSTOSCOPY: SHX5120

## 2021-09-14 HISTORY — DX: Nonrheumatic mitral (valve) insufficiency: I34.0

## 2021-09-14 HISTORY — DX: Unspecified osteoarthritis, unspecified site: M19.90

## 2021-09-14 HISTORY — PX: ANTERIOR AND POSTERIOR REPAIR WITH SACROSPINOUS FIXATION: SHX6536

## 2021-09-14 HISTORY — DX: Presence of spectacles and contact lenses: Z97.3

## 2021-09-14 HISTORY — DX: Cystocele, unspecified: N81.10

## 2021-09-14 HISTORY — DX: Peripheral vascular disease, unspecified: I73.9

## 2021-09-14 HISTORY — DX: Nonrheumatic aortic (valve) insufficiency: I35.1

## 2021-09-14 HISTORY — DX: Presence of dental prosthetic device (complete) (partial): Z97.2

## 2021-09-14 SURGERY — ANTERIOR AND POSTERIOR REPAIR WITH SACROSPINOUS FIXATION
Anesthesia: General | Site: Vagina

## 2021-09-14 MED ORDER — HYDRALAZINE HCL 20 MG/ML IJ SOLN
INTRAMUSCULAR | Status: AC
  Filled 2021-09-14: qty 1

## 2021-09-14 MED ORDER — SURGIFLO WITH THROMBIN (HEMOSTATIC MATRIX KIT) OPTIME
TOPICAL | Status: DC | PRN
  Administered 2021-09-14: 1 via TOPICAL

## 2021-09-14 MED ORDER — LABETALOL HCL 5 MG/ML IV SOLN
10.0000 mg | Freq: Once | INTRAVENOUS | Status: AC
Start: 2021-09-14 — End: 2021-09-14
  Administered 2021-09-14: 10 mg via INTRAVENOUS

## 2021-09-14 MED ORDER — POLYETHYLENE GLYCOL 3350 17 G PO PACK: 17.0000 g | PACK | Freq: Every day | ORAL | Status: DC

## 2021-09-14 MED ORDER — OXYCODONE HCL 5 MG PO TABS
5.0000 mg | ORAL_TABLET | ORAL | Status: DC | PRN
  Administered 2021-09-14: 5 mg via ORAL

## 2021-09-14 MED ORDER — ONDANSETRON HCL 4 MG/2ML IJ SOLN
INTRAMUSCULAR | Status: DC | PRN
Start: 2021-09-14 — End: 2021-09-14
  Administered 2021-09-14: 4 mg via INTRAVENOUS

## 2021-09-14 MED ORDER — ACETAMINOPHEN 500 MG PO TABS
1000.0000 mg | ORAL_TABLET | ORAL | Status: AC
  Administered 2021-09-14: 1000 mg via ORAL

## 2021-09-14 MED ORDER — MAGNESIUM 200 MG PO TABS
200.0000 mg | ORAL_TABLET | Freq: Every day | ORAL | Status: DC
  Filled 2021-09-14 (×3): qty 1

## 2021-09-14 MED ORDER — ONDANSETRON HCL 4 MG/2ML IJ SOLN: 4.0000 mg | Freq: Four times a day (QID) | INTRAMUSCULAR | Status: DC | PRN

## 2021-09-14 MED ORDER — LACTATED RINGERS IV BOLUS
500.0000 mL | Freq: Once | INTRAVENOUS | Status: AC
  Administered 2021-09-14: 500 mL via INTRAVENOUS

## 2021-09-14 MED ORDER — IBUPROFEN 200 MG PO TABS
600.0000 mg | ORAL_TABLET | Freq: Four times a day (QID) | ORAL | Status: DC
  Administered 2021-09-14 – 2021-09-15 (×3): 600 mg via ORAL

## 2021-09-14 MED ORDER — DEXAMETHASONE SODIUM PHOSPHATE 10 MG/ML IJ SOLN
INTRAMUSCULAR | Status: DC | PRN
Start: 2021-09-14 — End: 2021-09-14
  Administered 2021-09-14: 5 mg via INTRAVENOUS

## 2021-09-14 MED ORDER — LISINOPRIL 10 MG PO TABS
10.0000 mg | ORAL_TABLET | Freq: Two times a day (BID) | ORAL | Status: DC
  Administered 2021-09-14: 10 mg via ORAL
  Filled 2021-09-14: qty 1

## 2021-09-14 MED ORDER — PROPOFOL 10 MG/ML IV BOLUS
INTRAVENOUS | Status: AC
  Filled 2021-09-14: qty 20

## 2021-09-14 MED ORDER — ONDANSETRON HCL 4 MG PO TABS: 4.0000 mg | ORAL_TABLET | Freq: Four times a day (QID) | ORAL | Status: DC | PRN

## 2021-09-14 MED ORDER — CARVEDILOL 3.125 MG PO TABS
3.1250 mg | ORAL_TABLET | Freq: Two times a day (BID) | ORAL | Status: DC
  Administered 2021-09-14: 3.125 mg via ORAL
  Filled 2021-09-14: qty 1

## 2021-09-14 MED ORDER — FENTANYL CITRATE (PF) 250 MCG/5ML IJ SOLN
INTRAMUSCULAR | Status: AC
  Filled 2021-09-14: qty 5

## 2021-09-14 MED ORDER — ASPIRIN EC 81 MG PO TBEC
81.0000 mg | DELAYED_RELEASE_TABLET | Freq: Every day | ORAL | Status: DC
  Filled 2021-09-14: qty 1

## 2021-09-14 MED ORDER — ACETAMINOPHEN 325 MG PO TABS: 650.0000 mg | ORAL_TABLET | ORAL | Status: DC | PRN

## 2021-09-14 MED ORDER — LIDOCAINE HCL (CARDIAC) PF 100 MG/5ML IV SOSY
PREFILLED_SYRINGE | INTRAVENOUS | Status: DC | PRN
Start: 2021-09-14 — End: 2021-09-14
  Administered 2021-09-14: 40 mg via INTRAVENOUS

## 2021-09-14 MED ORDER — PROPOFOL 10 MG/ML IV BOLUS
INTRAVENOUS | Status: DC | PRN
  Administered 2021-09-14: 100 mg via INTRAVENOUS

## 2021-09-14 MED ORDER — LABETALOL HCL 5 MG/ML IV SOLN
INTRAVENOUS | Status: AC
  Filled 2021-09-14: qty 4

## 2021-09-14 MED ORDER — GENTAMICIN SULFATE 40 MG/ML IJ SOLN
260.0000 mg | INTRAVENOUS | Status: AC
  Administered 2021-09-14: 260 mg via INTRAVENOUS
  Filled 2021-09-14: qty 6.5

## 2021-09-14 MED ORDER — ACETAMINOPHEN 500 MG PO TABS
ORAL_TABLET | ORAL | Status: AC
  Filled 2021-09-14: qty 2

## 2021-09-14 MED ORDER — KETAMINE HCL 10 MG/ML IJ SOLN
INTRAMUSCULAR | Status: DC | PRN
  Administered 2021-09-14: 25 mg via INTRAVENOUS

## 2021-09-14 MED ORDER — 0.9 % SODIUM CHLORIDE (POUR BTL) OPTIME
TOPICAL | Status: DC | PRN
Start: 2021-09-14 — End: 2021-09-14
  Administered 2021-09-14: 500 mL

## 2021-09-14 MED ORDER — LIDOCAINE HCL (PF) 2 % IJ SOLN
INTRAMUSCULAR | Status: DC | PRN
  Administered 2021-09-14: 1.5 mg/kg/h via INTRADERMAL

## 2021-09-14 MED ORDER — OXYCODONE HCL 5 MG PO TABS
ORAL_TABLET | ORAL | Status: AC
  Filled 2021-09-14: qty 1

## 2021-09-14 MED ORDER — PHENYLEPHRINE 40 MCG/ML (10ML) SYRINGE FOR IV PUSH (FOR BLOOD PRESSURE SUPPORT)
PREFILLED_SYRINGE | INTRAVENOUS | Status: AC
  Filled 2021-09-14: qty 10

## 2021-09-14 MED ORDER — PHENYLEPHRINE 40 MCG/ML (10ML) SYRINGE FOR IV PUSH (FOR BLOOD PRESSURE SUPPORT)
PREFILLED_SYRINGE | INTRAVENOUS | Status: DC | PRN
  Administered 2021-09-14 (×2): 80 ug via INTRAVENOUS
  Administered 2021-09-14: 160 ug via INTRAVENOUS

## 2021-09-14 MED ORDER — HYDRALAZINE HCL 20 MG/ML IJ SOLN
10.0000 mg | Freq: Once | INTRAMUSCULAR | Status: AC
Start: 2021-09-14 — End: 2021-09-14
  Administered 2021-09-14: 10 mg via INTRAVENOUS

## 2021-09-14 MED ORDER — FENTANYL CITRATE (PF) 100 MCG/2ML IJ SOLN
INTRAMUSCULAR | Status: DC | PRN
  Administered 2021-09-14: 50 ug via INTRAVENOUS
  Administered 2021-09-14: 100 ug via INTRAVENOUS
  Administered 2021-09-14: 50 ug via INTRAVENOUS

## 2021-09-14 MED ORDER — ROCURONIUM BROMIDE 100 MG/10ML IV SOLN
INTRAVENOUS | Status: DC | PRN
Start: 2021-09-14 — End: 2021-09-14
  Administered 2021-09-14: 60 mg via INTRAVENOUS

## 2021-09-14 MED ORDER — CLINDAMYCIN PHOSPHATE 900 MG/50ML IV SOLN
900.0000 mg | INTRAVENOUS | Status: AC
  Administered 2021-09-14: 900 mg via INTRAVENOUS

## 2021-09-14 MED ORDER — LIDOCAINE-EPINEPHRINE 1 %-1:100000 IJ SOLN
INTRAMUSCULAR | Status: DC | PRN
  Administered 2021-09-14: 10 mL

## 2021-09-14 MED ORDER — FENTANYL CITRATE (PF) 100 MCG/2ML IJ SOLN: 25.0000 ug | INTRAMUSCULAR | Status: DC | PRN

## 2021-09-14 MED ORDER — SUGAMMADEX SODIUM 200 MG/2ML IV SOLN
INTRAVENOUS | Status: DC | PRN
  Administered 2021-09-14: 100 mg via INTRAVENOUS

## 2021-09-14 MED ORDER — HYDROCHLOROTHIAZIDE 25 MG PO TABS
25.0000 mg | ORAL_TABLET | Freq: Every day | ORAL | Status: DC
  Filled 2021-09-14: qty 1

## 2021-09-14 MED ORDER — ENOXAPARIN SODIUM 40 MG/0.4ML IJ SOSY: 40.0000 mg | PREFILLED_SYRINGE | INTRAMUSCULAR | Status: DC

## 2021-09-14 MED ORDER — KETAMINE HCL 50 MG/5ML IJ SOSY
PREFILLED_SYRINGE | INTRAMUSCULAR | Status: AC
  Filled 2021-09-14: qty 5

## 2021-09-14 MED ORDER — IBUPROFEN 200 MG PO TABS
ORAL_TABLET | ORAL | Status: AC
  Filled 2021-09-14: qty 3

## 2021-09-14 MED ORDER — CLINDAMYCIN PHOSPHATE 900 MG/50ML IV SOLN
INTRAVENOUS | Status: AC
  Filled 2021-09-14: qty 50

## 2021-09-14 MED ORDER — PHENAZOPYRIDINE HCL 100 MG PO TABS
200.0000 mg | ORAL_TABLET | ORAL | Status: AC
  Administered 2021-09-14: 200 mg via ORAL

## 2021-09-14 MED ORDER — LACTATED RINGERS IV SOLN: INTRAVENOUS | Status: DC

## 2021-09-14 MED ORDER — SODIUM CHLORIDE 0.9 % IR SOLN
Status: DC | PRN
  Administered 2021-09-14 (×2): 1000 mL via INTRAVESICAL

## 2021-09-14 MED ORDER — PHENAZOPYRIDINE HCL 100 MG PO TABS
ORAL_TABLET | ORAL | Status: AC
  Filled 2021-09-14: qty 2

## 2021-09-14 SURGICAL SUPPLY — 37 items
AGENT HMST KT MTR STRL THRMB (HEMOSTASIS)
BLADE CLIPPER SENSICLIP SURGIC (BLADE) ×3 IMPLANT
BLADE SURG 15 STRL LF DISP TIS (BLADE) ×2 IMPLANT
BLADE SURG 15 STRL SS (BLADE) ×3
DECANTER SPIKE VIAL GLASS SM (MISCELLANEOUS) IMPLANT
DEVICE CAPIO SLIM SINGLE (INSTRUMENTS) ×1 IMPLANT
ELECT REM PT RETURN 9FT ADLT (ELECTROSURGICAL) ×3
ELECTRODE REM PT RTRN 9FT ADLT (ELECTROSURGICAL) IMPLANT
GAUZE 4X4 16PLY ~~LOC~~+RFID DBL (SPONGE) ×3 IMPLANT
GLOVE SURG ENC MOIS LTX SZ6 (GLOVE) ×3 IMPLANT
GLOVE SURG UNDER POLY LF SZ6.5 (GLOVE) ×3 IMPLANT
GOWN STRL REUS W/TWL LRG LVL3 (GOWN DISPOSABLE) ×3 IMPLANT
HIBICLENS CHG 4% 4OZ BTL (MISCELLANEOUS) ×3 IMPLANT
HOLDER FOLEY CATH W/STRAP (MISCELLANEOUS) ×3 IMPLANT
IV NS 1000ML (IV SOLUTION) ×6
IV NS 1000ML BAXH (IV SOLUTION) IMPLANT
KIT TURNOVER CYSTO (KITS) ×3 IMPLANT
MANIFOLD NEPTUNE II (INSTRUMENTS) ×3 IMPLANT
NDL MAYO 6 CRC TAPER PT (NEEDLE) IMPLANT
NEEDLE HYPO 22GX1.5 SAFETY (NEEDLE) ×3 IMPLANT
NEEDLE MAYO 6 CRC TAPER PT (NEEDLE) ×6 IMPLANT
NS IRRIG 1000ML POUR BTL (IV SOLUTION) ×3 IMPLANT
PACK CYSTO (CUSTOM PROCEDURE TRAY) ×3 IMPLANT
PACK VAGINAL WOMENS (CUSTOM PROCEDURE TRAY) ×3 IMPLANT
RETRACTOR LONE STAR DISPOSABLE (INSTRUMENTS) ×3 IMPLANT
RETRACTOR STAY HOOK 5MM (MISCELLANEOUS) ×3 IMPLANT
SET IRRIG Y TYPE TUR BLADDER L (SET/KITS/TRAYS/PACK) ×1 IMPLANT
SURGIFLO W/THROMBIN 8M KIT (HEMOSTASIS) IMPLANT
SUT ABS MONO DBL WITH NDL 48IN (SUTURE) ×2 IMPLANT
SUT VIC AB 0 CT1 27 (SUTURE)
SUT VIC AB 0 CT1 27XBRD ANTBC (SUTURE) IMPLANT
SUT VIC AB 2-0 SH 27 (SUTURE)
SUT VIC AB 2-0 SH 27XBRD (SUTURE) IMPLANT
SUT VICRYL 2-0 SH 8X27 (SUTURE) ×3 IMPLANT
SYR BULB EAR ULCER 3OZ GRN STR (SYRINGE) ×3 IMPLANT
TOWEL OR 17X26 10 PK STRL BLUE (TOWEL DISPOSABLE) ×3 IMPLANT
TRAY FOLEY W/BAG SLVR 14FR LF (SET/KITS/TRAYS/PACK) ×3 IMPLANT

## 2021-09-14 NOTE — Discharge Instructions (Addendum)
POST OPERATIVE INSTRUCTIONS  General Instructions Recovery (not bed rest) will last approximately 6 weeks Walking is encouraged, but refrain from strenuous exercise/ housework/ heavy lifting. No lifting >10lbs  Nothing in the vagina- NO intercourse, tampons or douching Bathing:  Do not submerge in water (NO swimming, bath, hot tub, etc) until after your postop visit. You can shower starting the day after surgery.  No driving until you are not taking narcotic pain medicine and until your pain is well enough controlled that you can slam on the breaks or make sudden movements if needed.   Taking your medications Please take your acetaminophen and ibuprofen on a schedule for the first 48 hours. Take 600mg  ibuprofen, then take 500mg  acetaminophen 3 hours later, then continue to alternate ibuprofen and acetaminophen. That way you are taking each type of medication every 6 hours. Take the prescribed narcotic (oxycodone, tramadol, etc) as needed, with a maximum being every 4 hours.  Take a stool softener daily to keep your stools soft and preventing you from straining. If you have diarrhea, you decrease your stool softener. This is explained more below. We have prescribed you Miralax.  Reasons to Call the Nurse (see last page for phone numbers) Heavy Bleeding (changing your pad every 1-2 hours) Persistent nausea/vomiting Fever (100.4 degrees or more) Incision problems (pus or other fluid coming out, redness, warmth, increased pain)  Things to Expect After Surgery Mild to Moderate pain is normal during the first day or two after surgery. If prescribed, take Ibuprofen or Tylenol first and use the stronger medicine for break-through pain. You can overlap these medicines because they work differently.   Constipation   To Prevent Constipation:  Eat a well-balanced diet including protein, grains, fresh fruit and vegetables.  Drink plenty of fluids. Walk regularly.  Depending on specific instructions  from your physician: take Miralax daily and additionally you can add a stool softener (colace/ docusate) and fiber supplement. Continue as long as you're on pain medications.   To Treat Constipation:  If you do not have a bowel movement in 2 days after surgery, you can take 2 Tbs of Milk of Magnesia 1-2 times a day until you have a bowel movement. If diarrhea occurs, decrease the amount or stop the laxative. If no results with Milk of Magnesia, you can drink a bottle of magnesium citrate which you can purchase over the counter.  Fatigue:  This is a normal response to surgery and will improve with time.  Plan frequent rest periods throughout the day.  Gas Pain:  This is very common but can also be very painful! Drink warm liquids such as herbal teas, bouillon or soup. Walking will help you pass more gas.  Mylicon or Gas-X can be taken over the counter.  Leaking Urine:  Varying amounts of leakage may occur after surgery.  This should improve with time. Your bladder needs at least 3 months to recover from surgery. If you leak after surgery, be sure to mention this to your doctor at your post-op visit. If you were taking medications for overactive bladder prior to surgery, be sure to restart the medications immediately after surgery.  Incisions: If you have incisions on your abdomen, the skin glue will dissolve on its own over time. It is ok to gently rinse with soap and water over these incisions but do not scrub.  Catheter You were sent home with a catheter that we will keep in place for about 10 days. We will call you to arrange  the catheter removal.   Return to Work  As work demands and recovery times vary widely, it is hard to predict when you will want to return to work. If you have a desk job with no strenuous physical activity, and if you would like to return sooner than generally recommended, discuss this with your provider or call our office.   Post op concerns  For non-emergent issues,  please call the Urogynecology Nurse. Please leave a message and someone will contact you within one business day.  You can also send a message through MyChart.   AFTER HOURS (After 5:00 PM and on weekends):  For urgent matters that cannot wait until the next business day. Call our office [phone] and connect to the doctor on call.  Please reserve this for important issues.   **FOR ANY TRUE EMERGENCY ISSUES CALL 911 OR GO TO THE NEAREST EMERGENCY ROOM.** Please inform our office or the doctor on call of any emergency.     APPOINTMENTS: Call [phone]   Post Anesthesia Home Care Instructions  Activity: Get plenty of rest for the remainder of the day. A responsible individual must stay with you for 24 hours following the procedure.  For the next 24 hours, DO NOT: -Drive a car -Advertising copywriter -Drink alcoholic beverages -Take any medication unless instructed by your physician -Make any legal decisions or sign important papers.  Meals: Start with liquid foods such as gelatin or soup. Progress to regular foods as tolerated. Avoid greasy, spicy, heavy foods. If nausea and/or vomiting occur, drink only clear liquids until the nausea and/or vomiting subsides. Call your physician if vomiting continues.  Special Instructions/Symptoms: Your throat may feel dry or sore from the anesthesia or the breathing tube placed in your throat during surgery. If this causes discomfort, gargle with warm salt water. The discomfort should disappear within 24 hours.

## 2021-09-14 NOTE — Anesthesia Postprocedure Evaluation (Addendum)
Anesthesia Post Note  Patient: Eileen Hansen  Procedure(s) Performed: ANTERIOR REPAIR WITH SACROSPINOUS FIXATION (Vagina ) CYSTOSCOPY (Bladder)     Patient location during evaluation: PACU Anesthesia Type: General Level of consciousness: awake and alert, oriented and patient cooperative Pain management: pain level controlled Vital Signs Assessment: post-procedure vital signs reviewed and stable Respiratory status: spontaneous breathing, nonlabored ventilation and respiratory function stable Cardiovascular status: blood pressure returned to baseline and stable Postop Assessment: no apparent nausea or vomiting Anesthetic complications: no Comments: Very hypertensive on arrival to PACU, treated w/ hydralazine and labetalol w/ good effect and subsequently volume resuscitated. However, upon ambulating pt feeling nauseous and palpitations- HR consistently in the 70s despite feeling palpitations. Pt became tearful upon feeling palpitations again. Discussed extensively w/ patient and she is uncomfortable going home and would like to stay overnight for closer monitoring. Very anxious at baseline about her health, has history of anxiety and panic attacks. Dr. Florian Buff aware, will send to [facility] overnight.   No notable events documented.  Last Vitals:  Vitals:   09/14/21 1640 09/14/21 1645  BP: 127/67 (!) 120/59  Pulse: 77 67  Resp: 13 13  Temp:    SpO2: 96% 95%    Last Pain:  Vitals:   09/14/21 1640  TempSrc:   PainSc: 0-No pain                 Lannie Fields

## 2021-09-14 NOTE — Op Note (Signed)
Operative Note  Preoperative Diagnosis: anterior vaginal prolapse and uterovaginal prolapse, incomplete  Postoperative Diagnosis: same  Procedures performed:  Anterior repair, sacrospinous ligament fixation, cystoscopy  Attending Surgeon: Lanetta Inch, MD   Anesthesia: General endotracheal  Findings: 1. On vaginal exam, stage II pelvic organ prolapse noted  2. On cystoscopy, normal mucosa present. Bladder injury, <1cm, noted medial to the right ureteral orifice , just above the trigone. Brisk bilateral ureteral efflux noted.    Specimens: none  Estimated blood loss: 50 mL  IV fluids: 750 mL  Urine output: 100 mL  Complications: Cystoscopy noted injury to the bladder. Intraoperative Urology consult obtained and consensus was that injury was medial enough to ureter to avoid injury. Decision was made to place prolonged foley catheter.   Procedure in Detail: After informed consent was obtained, the patient was taken to the operating room where anesthesia was induced and found to be adequate. She was placed in dorsal lithotomy position, taking care to avoid any traction on the extremities, and then prepped and draped in the usual sterile fashion. A self-retaining lonestar retractor was placed using four elastic blue stays.  After a foley catheter was inserted into the urethra, the location of the midurethra was palpated. Two Allis clamps were along the anterior vaginal wall defect. 1% lidocaine with epinephrine was injected into the vaginal mucosa.  A vertical incision was made between these two Allis clamps with a 15 blade scalpel.  Allis clamps were placed along this incision and Metzenbaum scissors were used to undermine the vaginal mucosa along the incision.  The vaginal mucosa was then sharply dissected off to the vesicovaginal septum bilaterally to the level of the pubic rami.    For the sacrospinous ligament fixation (SSLF), the ischial spine was accessed on the right side via  dissection with Mayo scissors and blunt dissection.  The sacrospinous ligament was palpated. Two 0 PDS suture was then placed at the sacrospinous ligament two fingerbreadths medial to the ischial spine, in order to avoid the pudendal neurovascular bundle, using a Capio needle driver.  The PDS suture was attached to the vaginal epithelium on the ipsilateral side of the vaginal apex and held. Anterior plication of the vesicovaginal septum was then performed using plicating sutures of 2-0 Vicryl. The vaginal mucosal edges were trimmed and the incision reapproximated with 2-0 Vicryl in a running fashion. The SSLF suture was then tied down with excellent support of the anterior and apical vagina.   The Foley catheter was removed.  A 70-degree cystoscope was introduced, and 360-degree inspection revealed a bladder injury <1cm, noted medial to the right ureteral orifice , just above the trigone. Brisk bilateral ureteral efflux noted with the assistance of pre-operative pyridium. The cystoscope was removed. The anterior vaginal epithelial sutures were removed and no water was noted with filled bladder. Intraoperative Urology consult was obtained. Consensus was that the injury was medial enough to the ureter that no further ureteral evaluation was required.  The bladder was drained and the cystoscope was removed.  The Foley catheter was reinserted. The anterior vaginal wall was closed with 2-0 vicryl. The vagina was copiously irrigated.  Hemostasis was noted.    The patient tolerated the procedure well.  She was awakened from anesthesia and transferred to the recovery room in stable condition. All counts were correct x 2.  She will keep the catheter for 10 days post operatively to allow for bladder healing.   Eileen Beards, MD

## 2021-09-14 NOTE — Transfer of Care (Signed)
Immediate Anesthesia Transfer of Care Note  Patient: Eileen Hansen  Procedure(s) Performed: ANTERIOR REPAIR WITH SACROSPINOUS FIXATION (Vagina ) CYSTOSCOPY (Bladder)  Patient Location: PACU  Anesthesia Type:General  Level of Consciousness: awake, alert  and oriented  Airway & Oxygen Therapy: Patient Spontanous Breathing and Patient connected to nasal cannula oxygen  Post-op Assessment: Report given to RN and Post -op Vital signs reviewed and stable  Post vital signs: Reviewed and stable  Last Vitals:  Vitals Value Taken Time  BP 188/102 09/14/21 1606  Temp 36.3 C 09/14/21 1604  Pulse 71 09/14/21 1610  Resp 13 09/14/21 1610  SpO2 98 % 09/14/21 1610  Vitals shown include unvalidated device data.  Last Pain:  Vitals:   09/14/21 1252  TempSrc: Oral  PainSc: 0-No pain         Complications: No notable events documented.

## 2021-09-14 NOTE — Telephone Encounter (Signed)
Eileen Hansen underwent anterior repair with sacrospinous ligament fixation on 09/14/21.   Eileen Hansen had a bladder injury and was sent home with a catheter.   Eileen Hansen will need to have a cystogram performed on 09/24/21 then if this shows that injury has healed, will need voiding trial on 09/25/21. Please call her for a routine post op check and to schedule tests. Thanks!  [name], MD

## 2021-09-14 NOTE — Progress Notes (Signed)
Patient states she feels like her heart is racing pulse is 69-70

## 2021-09-14 NOTE — Anesthesia Procedure Notes (Signed)
Procedure Name: Intubation Date/Time: 09/14/2021 2:31 PM Performed by: Jonna Munro, CRNA Pre-anesthesia Checklist: Patient identified, Emergency Drugs available, Suction available, Patient being monitored and Timeout performed Patient Re-evaluated:Patient Re-evaluated prior to induction Oxygen Delivery Method: Circle system utilized Preoxygenation: Pre-oxygenation with 100% oxygen Induction Type: IV induction Ventilation: Mask ventilation without difficulty Laryngoscope Size: Mac and 3 Grade View: Grade I Tube type: Oral Tube size: 7.0 mm Number of attempts: 1 Airway Equipment and Method: Stylet Placement Confirmation: ETT inserted through vocal cords under direct vision, positive ETCO2 and breath sounds checked- equal and bilateral Secured at: 22 cm Tube secured with: Tape Dental Injury: Teeth and Oropharynx as per pre-operative assessment

## 2021-09-14 NOTE — Progress Notes (Signed)
Received from PACU via stretcher. Ambulated to bed without difficulty, denies SOB, dizziness, or nausea. Family brought to room.

## 2021-09-14 NOTE — Interval H&P Note (Signed)
History and Physical Interval Note:  09/14/2021 1:57 PM  Eileen Hansen  has presented today for surgery, with the diagnosis of vaginal vault prolapse; anterior vaginal prolpase.  The various methods of treatment have been discussed with the patient and family. After consideration of risks, benefits and other options for treatment, the patient has consented to  Procedure(s): ANTERIOR REPAIR WITH SACROSPINOUS FIXATION (N/A) CYSTOSCOPY (N/A) as a surgical intervention.  The patient's history has been reviewed, patient examined, no change in status, stable for surgery.  I have reviewed the patient's chart and labs.  Questions were answered to the patient's satisfaction.     [name]

## 2021-09-14 NOTE — Progress Notes (Signed)
Patient moved to RCC 4.

## 2021-09-14 NOTE — Progress Notes (Signed)
500cc bolus is finished patient states she feels like she needs air pulse ox 97%.

## 2021-09-14 NOTE — Progress Notes (Signed)
Called Dr Armond Hang to inform ob elevated BP, orders given.

## 2021-09-14 NOTE — Progress Notes (Signed)
On admission small areas of "puffiness" noted on either side of neck. No apparent fluid or air.

## 2021-09-14 NOTE — Progress Notes (Signed)
Dr Salvadore Farber at bedside examined pockets of what felt soft on sides of neck.

## 2021-09-14 NOTE — Anesthesia Preprocedure Evaluation (Addendum)
Anesthesia Evaluation  Patient identified by MRN, date of birth, ID band Patient awake    Reviewed: Allergy & Precautions, NPO status , Patient's Chart, lab work & pertinent test results, reviewed documented beta blocker date and time   Airway Mallampati: II  TM Distance: >3 FB Neck ROM: Full    Dental no notable dental hx. (+) Teeth Intact, Dental Advisory Given   Pulmonary neg pulmonary ROS,    Pulmonary exam normal breath sounds clear to auscultation       Cardiovascular hypertension, Pt. on home beta blockers and Pt. on medications + Peripheral Vascular Disease  Normal cardiovascular exam Rhythm:Regular Rate:Normal  TTE 2022 1. Left ventricular ejection fraction, by estimation, is 60 to 65%. The  left ventricle has normal function. The left ventricle has no regional  wall motion abnormalities. There is mild left ventricular hypertrophy.  Left ventricular diastolic parameters  are consistent with Grade I diastolic dysfunction (impaired relaxation).  2. Right ventricular systolic function is normal. The right ventricular  size is normal. There is normal pulmonary artery systolic pressure. The  estimated right ventricular systolic pressure is 22.5 mmHg.  3. The mitral valve is normal in structure. Trivial mitral valve  regurgitation. No evidence of mitral stenosis.  4. The aortic valve is tricuspid. Aortic valve regurgitation is trivial.  Mild aortic valve sclerosis is present, with no evidence of aortic valve  stenosis.  5. The inferior vena cava is normal in size with greater than 50%  respiratory variability, suggesting right atrial pressure of 3 mmHg.  Stress Test 2022   The study is normal. Findings are consistent with no prior ischemia and no prior myocardial infarction. The study is low risk.   No ST deviation was noted.   LV perfusion is normal. There is no evidence of ischemia. There is no evidence of  infarction.   Left ventricular function is normal. Nuclear stress EF: 78 %. The left ventricular ejection fraction is hyperdynamic (>65%). End diastolic cavity size is normal.   Prior study not available for comparison.    Neuro/Psych negative neurological ROS  negative psych ROS   GI/Hepatic negative GI ROS, Neg liver ROS,   Endo/Other  negative endocrine ROS  Renal/GU negative Renal ROS  negative genitourinary   Musculoskeletal  (+) Arthritis ,   Abdominal   Peds  Hematology negative hematology ROS (+)   Anesthesia Other Findings   Reproductive/Obstetrics                            Anesthesia Physical Anesthesia Plan  ASA: 2  Anesthesia Plan: General   Post-op Pain Management: Tylenol PO (pre-op)*, Toradol IV (intra-op)*, Lidocaine infusion* and Ketamine IV*   Induction: Intravenous  PONV Risk Score and Plan: 3 and Dexamethasone, Ondansetron and Treatment may vary due to age or medical condition  Airway Management Planned: Oral ETT  Additional Equipment:   Intra-op Plan:   Post-operative Plan: Extubation in OR  Informed Consent: I have reviewed the patients History and Physical, chart, labs and discussed the procedure including the risks, benefits and alternatives for the proposed anesthesia with the patient or authorized representative who has indicated his/her understanding and acceptance.     Dental advisory given  Plan Discussed with: CRNA  Anesthesia Plan Comments:         Anesthesia Quick Evaluation

## 2021-09-15 DIAGNOSIS — N812 Incomplete uterovaginal prolapse: Secondary | ICD-10-CM | POA: Diagnosis not present

## 2021-09-15 MED ORDER — IBUPROFEN 200 MG PO TABS
ORAL_TABLET | ORAL | Status: AC
  Filled 2021-09-15: qty 3

## 2021-09-15 NOTE — Discharge Summary (Signed)
Physician Discharge Summary  Patient ID: Eileen Hansen MRN: [id number] DOB/AGE: 78-19-1947 78 y.o.  Admit date: 09/14/2021 Discharge date: 09/15/2021  Admission Diagnoses:  Discharge Diagnoses:  Principal Problem:   Uterovaginal prolapse, incomplete   Discharged Condition: good  Hospital Course: 76yo F presented for surgery: anterior repair, sacrospinous ligament fixation and cystoscopy. There was a bladder injury noted during cystoscopy and catheter was placed for bladder drainage. Post operatively her blood pressure was labile and decision was made to admit for observation. Overnight, BP was within normal range.   Consults: None  Significant Diagnostic Studies: none  Treatments: analgesia: acetaminophen and ibuprofen and oxycodone  Discharge Exam: Blood pressure (!) 97/52, pulse 64, temperature 97.8 F (36.6 C), resp. rate 16, height 4\' 11"  (1.499 m), weight 52.9 kg, SpO2 96 %. General appearance: alert, cooperative, and no distress Resp: clear to auscultation bilaterally Cardio: regular rate and rhythm, S1, S2 normal GI: soft, non-tender; bowel sounds normal; no masses,  no organomegaly Extremities: extremities normal, atraumatic, no cyanosis or edema and SCDs in place  Disposition: Discharge disposition: 01-Home or Self Care       Discharge Instructions     Call MD for:  persistant nausea and vomiting   Complete by: As directed    Call MD for:  persistant nausea and vomiting   Complete by: As directed    Call MD for:  severe uncontrolled pain   Complete by: As directed    Call MD for:  severe uncontrolled pain   Complete by: As directed    Call MD for:  temperature >100.4   Complete by: As directed    Call MD for:  temperature >100.4   Complete by: As directed    Diet general   Complete by: As directed    Diet general   Complete by: As directed    Increase activity slowly   Complete by: As directed    Increase activity slowly   Complete by: As directed     May walk up steps   Complete by: As directed    May walk up steps   Complete by: As directed    No wound care   Complete by: As directed    No wound care   Complete by: As directed       Allergies as of 09/15/2021       Reactions   Fenofibrate Other (See Comments)   Abdominal pain   Penicillins Rash   REACTION: Hives Anaphylaxis        Medication List     TAKE these medications    acetaminophen 500 MG tablet Commonly known as: TYLENOL Take 1 tablet (500 mg total) by mouth every 6 (six) hours as needed (pain).   aspirin EC 81 MG tablet Take 81 mg by mouth daily. Swallow whole.   carvedilol 3.125 MG tablet Commonly known as: COREG Take 1 tablet (3.125 mg total) by mouth 2 (two) times daily.   COLLAGEN PO Take by mouth daily.   hydrochlorothiazide 25 MG tablet Commonly known as: HYDRODIURIL TAKE ONE TABLET BY MOUTH ONE TIME DAILY   ibuprofen 600 MG tablet Commonly known as: ADVIL Take 1 tablet (600 mg total) by mouth every 6 (six) hours as needed. Notes to patient: Next dose is due at 230 pm as needed for pain   lisinopril 10 MG tablet Commonly known as: ZESTRIL Take 2 tablets (20 mg total) by mouth daily. What changed:  how much to take when to take this  Magnesium 200 MG Tabs Take 1 tablet (200 mg total) by mouth at bedtime. What changed:  how much to take additional instructions   omega-3 acid ethyl esters 1 g capsule Commonly known as: LOVAZA Take 1 capsule (1 g total) by mouth 2 (two) times daily.   oxyCODONE 5 MG immediate release tablet Commonly known as: Oxy IR/ROXICODONE Take 1 tablet (5 mg total) by mouth every 4 (four) hours as needed for severe pain.   polyethylene glycol powder 17 GM/SCOOP powder Commonly known as: GLYCOLAX/MIRALAX Take 17 g by mouth daily. Drink 17g (1 scoop) dissolved in water per day.   rosuvastatin 20 MG tablet Commonly known as: CRESTOR Take 1 tablet (20 mg total) by mouth daily.   vitamin B-12 1000 MCG  tablet Commonly known as: CYANOCOBALAMIN Take 1,000 mcg by mouth daily.   Vitamin D3 25 MCG (1000 UT) Caps Take 1 capsule by mouth daily.         Signed: [name] 09/15/2021, 9:24 AM

## 2021-09-15 NOTE — Telephone Encounter (Signed)
Post- Op Call  Eileen Hansen underwent anterior repair with sacrospinous ligament fixation on 09/14/21 with Dr Florian Buff. The patient reports that her pain is controlled. She is taking tylenol and ibuprofen. She reports vaginal bleeding that is small. She has not had a bowel movement and is taking miralax for a bowel regimen. She was discharged with a catheter and will return on 09/25/21 for a voiding trial. Pt to call back and schedule appt once she speaks with her daughter.   Harrie Jeans, RN

## 2021-09-16 ENCOUNTER — Encounter (HOSPITAL_BASED_OUTPATIENT_CLINIC_OR_DEPARTMENT_OTHER): Payer: Self-pay | Admitting: Obstetrics and Gynecology

## 2021-09-21 ENCOUNTER — Encounter: Payer: Self-pay | Admitting: *Deleted

## 2021-09-22 ENCOUNTER — Telehealth: Payer: Self-pay

## 2021-09-22 NOTE — Telephone Encounter (Signed)
Eileen Hansen is a 76 y.o. female contacted AS:[id number] in Cystogram appointment. ? ?Past appointment: 10/02/2021 at 10:30am [facility]. ? ?NEW appointment: 09/24/2021 at 10 am with an arrival time of 9:45am at [facility]. ? ?Pt has been notified ?

## 2021-09-24 ENCOUNTER — Ambulatory Visit (HOSPITAL_COMMUNITY)
Admission: RE | Admit: 2021-09-24 | Discharge: 2021-09-24 | Disposition: A | Discharge status: Home / Self Care | Payer: Medicare PPO | Source: Ambulatory Visit | Attending: Obstetrics and Gynecology | Admitting: Obstetrics and Gynecology

## 2021-09-24 ENCOUNTER — Other Ambulatory Visit: Payer: Self-pay

## 2021-09-24 DIAGNOSIS — S3720XA Unspecified injury of bladder, initial encounter: Secondary | ICD-10-CM | POA: Diagnosis present

## 2021-09-24 DIAGNOSIS — S3729XA Other injury of bladder, initial encounter: Secondary | ICD-10-CM | POA: Diagnosis not present

## 2021-09-24 MED ORDER — IOTHALAMATE MEGLUMINE 17.2 % UR SOLN
250.0000 mL | Freq: Once | URETHRAL | Status: AC | PRN
  Administered 2021-09-24: 350 mL via INTRAVESICAL

## 2021-09-25 ENCOUNTER — Ambulatory Visit: Payer: Medicare PPO

## 2021-09-25 VITALS — BP 155/79 | HR 67

## 2021-09-25 DIAGNOSIS — N8110 Cystocele, unspecified: Secondary | ICD-10-CM

## 2021-09-25 NOTE — Progress Notes (Signed)
Urogyn Nurse Voiding Trial Note ? ?Eileen Hansen underwent ANTERIOR REPAIR WITH SACROSPINOUS FIXATION with Cystoscopy on 09/14/2021.  She presents today for a voiding trial .  ? ?Patient was identified with 2 indicators. 26ml of NS was instilled into the bladder via the catheter. The catheter was removed and patient was instructed to void into the urinary hat. She voided 255ml. The post void residual measured by bladder scan was 68ml. She passed the voiding trial and a catheter was not replaced.  ? ?The patient received aftercare instructions and will follow up as scheduled.  ?  ?

## 2021-10-02 ENCOUNTER — Ambulatory Visit (HOSPITAL_COMMUNITY): Payer: Medicare PPO

## 2021-10-26 ENCOUNTER — Encounter: Payer: Self-pay | Admitting: Obstetrics and Gynecology

## 2021-10-26 ENCOUNTER — Ambulatory Visit (INDEPENDENT_AMBULATORY_CARE_PROVIDER_SITE_OTHER): Payer: Medicare PPO | Admitting: Obstetrics and Gynecology

## 2021-10-26 VITALS — BP 134/82 | HR 67

## 2021-10-26 DIAGNOSIS — Z9889 Other specified postprocedural states: Secondary | ICD-10-CM

## 2021-10-26 NOTE — Progress Notes (Signed)
?[facility] ? ?Date of Visit: 10/26/2021 ? ?History of Present Illness: Ms. Eileen Hansen is a 76 y.o. female scheduled today for a post-operative visit.  ?? Surgery: s/p Anterior repair, sacrospinous ligament fixation, cystoscopy on 09/14/21 ?? She had a bladder injury and required a foley catheter post operatively for drainage. Cystogram was performed on 09/24/21 and did not show any persistent injury. She then had successful voiding trial.  ? ?Today she reports she is feeling well. She is very happy with the result of her surgery.  ? ?UTI in the last 6 weeks? No  ?Pain? No  ?Vaginal bulge? No  ?Stress incontinence: No  ?Urgency/frequency: Yes  ?Urge incontinence: No  ?Voiding dysfunction: No  ?Bowel issues: No , has some constipation occasionally ? ?Subjective Success: Do you usually have a bulge or something falling out that you can see or feel in the vaginal area? No  ?Retreatment Success: Any retreatment with surgery or pessary for any compartment? No  ? ? ?Medications: She has a current medication list which includes the following prescription(s): aspirin ec, carvedilol, vitamin d3, collagen, hydrochlorothiazide, lisinopril, magnesium, omega-3 acid ethyl esters, rosuvastatin, and vitamin b-12.  ? ?Allergies: Patient is allergic to fenofibrate and penicillins.  ? ?Physical Exam: ?BP 134/82   Pulse 67   ?Abdomen: soft, non-tender, without masses or organomegaly ?Pelvic Examination: Vagina: Incisions healing well. Sutures are present at the cuff and there is not granulation tissue. No tenderness along the anterior or posterior vagina. Mild apical tenderness. No pelvic masses.  ? ?POP-Q: ?POP-Q ? ?-3  ?                                          Aa   ?-3 ?                                          Ba  ?-7  ?                                            C  ? ?2.5  ?                                          Gh  ?3.5  ?                                          Pb  ?8  ?                                          tvl   ? ?-2  ?                                          Ap  ?-2  ?  Bp  ?-8  ?                                            D  ? ? ?--------------------------------------------------------- ? ?Assessment and Plan:  ?1. Post-operative state   ? ? ?- Healing well.  ?- Can resume regular activity including exercise and intercourse,  if desired.  ?- Discussed avoidance of heavy lifting and straining long term to reduce the risk of recurrence.  ? ?Follow up as needed ? ?[name], MD ? ? ?

## 2021-11-04 NOTE — Progress Notes (Signed)
? ? ?ACUTE VISIT ?Chief Complaint  ?Patient presents with  ? Pain  ?  All over, toes and fingers numb  ? ?HPI: ?[name] is a 76 y.o. right handed female with hx of PAD,HTN,HLD, and OA here today to address some concerns. ? ?Numbness of hands and toes: She has had symptoms for about a month. ?No new medication. ?No associated weakness or rash. ?Hand numbness is more frequent at night, wakes her up, and in the morning. It is alleviated by moving hands. ?Left wrist achy pain, can be severe, no limitation of ROM. ?Neck pain, this has been going on for sometimes, it is not radiated and aggravated by movement. ? ?Lab Results  ?Component Value Date  ? [id number] >1504 (H) 08/25/2021  ?She stopped B12 supplementation. ? ?Bruising on forearms with minimal or no trauma. ?Negative for nose/gum bleeding,blood in stool,melena,or gross hematuria. ?She is on Aspirin 81 mg daily. ? ?Concerned about possible possible blood clots in RLE and heart problems. ?Negative for edema or erythema. ?Still having right knee pain and calf discomfort. She had LE vas US in 09/2019 when symptom started, negative for DVT. ?LE artery doppler 04/29/2021: Right: Mixed plaque throughout.  ?30-49% stenosis in the proximal SFA, distal to ostium, and distal SFA, high end range.  ?50-74% stenosis in the mid SFA, high end range. ? ?Left: Mixed plaque throughout.  ?30-49% stenosis in the proximal SFA, distal to ostium, and distal SFA, low end range.  ?50-74% stenosis in the mid SFA, low end range.  ? ?Lexiscan stress test on 04/30/22 was normal. ?Negative for exertional CP,palpitations,DOE, orthopnea,PND,or unusual LE edema. ?Still having this episodes she described like trying to catch ones breath when sobbing/crying,like throat contractions for a few seconds, not as frequent. It happens when in bed and sometiems through the day. She has not identified exacerbating or alleviating factors. ?Lab Results  ?Component Value Date  ? CHOL 103 07/30/2021  ?  HDL 32 (L) 07/30/2021  ? LDLCALC 54 07/30/2021  ? LDLDIRECT 152.0 06/25/2019  ? TRIG 84 07/30/2021  ? CHOLHDL 3.2 07/30/2021  ? ? ?She is on Crestor 20 mg daily ?HTN on Lisinopril 10 mg,HCTZ 25 mg,and Carvedilol 3.125 mg bid. ?PAD, follows with vascular, has an appt in 12/2021. She also follows regularly with cardiologist. ? ?For right knee pain, she follows with ortho. She received 3 injections of hyaluronic acid 1-2 years ago. She was hoping to get more when she saw her ortho last visit.She received an intra articular steroid injection , which did not help. ? ?Skin pruritus when exposed to direct sun light and erythematous rash on exposed areas, 2-3 min after exposure. ?This has been going on for 2-3 weeks. ?No associated cough,wheezing,or oral edema. ?She noted that problem started when she start vit C, noted some improvement when she discontinue it. ? ?Review of Systems  ?Constitutional:  Negative for activity change, appetite change and fever.  ?HENT:  Negative for mouth sores and trouble swallowing.   ?Eyes:  Negative for redness and visual disturbance.  ?Gastrointestinal:  Negative for abdominal pain, nausea and vomiting.  ?     Negative for changes in bowel habits.  ?Endocrine: Negative for cold intolerance and heat intolerance.  ?Genitourinary:  Negative for decreased urine volume and dysuria.  ?Musculoskeletal:  Positive for arthralgias. Negative for gait problem.  ?Neurological:  Negative for syncope and headaches.  ?Rest see pertinent positives and negatives per HPI. ? ?Current Outpatient Medications on File Prior to Visit  ?Medication  Sig Dispense Refill  ? aspirin EC 81 MG tablet Take 81 mg by mouth daily. Swallow whole.    ? carvedilol (COREG) 3.125 MG tablet Take 1 tablet (3.125 mg total) by mouth 2 (two) times daily. 180 tablet 3  ? Cholecalciferol (VITAMIN D3) 25 MCG (1000 UT) CAPS Take 1 capsule by mouth daily.    ? COLLAGEN PO Take by mouth daily.    ? hydrochlorothiazide (HYDRODIURIL) 25 MG  tablet TAKE ONE TABLET BY MOUTH ONE TIME DAILY 90 tablet 2  ? lisinopril (ZESTRIL) 10 MG tablet Take 2 tablets (20 mg total) by mouth daily. (Patient taking differently: Take 10 mg by mouth 2 (two) times daily.) 180 tablet 2  ? Magnesium 200 MG TABS Take 1 tablet (200 mg total) by mouth at bedtime. (Patient taking differently: Take 240 mg by mouth at bedtime. Magnesium glyconate takes 240 mg 2 tabs at hs) 30 tablet 11  ? omega-3 acid ethyl esters (LOVAZA) 1 g capsule Take 1 capsule (1 g total) by mouth 2 (two) times daily. 180 capsule 3  ? rosuvastatin (CRESTOR) 20 MG tablet Take 1 tablet (20 mg total) by mouth daily. 90 tablet 3  ? vitamin B-12 (CYANOCOBALAMIN) 1000 MCG tablet Take 1,000 mcg by mouth daily.    ? ?No current facility-administered medications on file prior to visit.  ? ?Past Medical History:  ?Diagnosis Date  ? Allergic rhinitis   ? Arthralgia 09/30/2015  ? Arthritis   ? fingers  ? Bruises easily and skin thin 09/11/2021  ? Chronic  bilateral shoulder pain left shoulder limited rom 12/07/2016  ? Heart murmur 12/07/2016  ? HTN (hypertension)   ? Lexiscan Myoview 04/2021   ? Myoview 10/22: EF 78, no ischemia or infarction; low risk  ? mild Aortic valve insufficiency   ? Mild Mitral valve regurgitation   ? PAD (peripheral artery disease) (HCC)   ? Pain in the wrist, unspecified laterality 03/03/2014  ? Personal history of COVID-19 04/2021  ? mild symptoms x 1 week all symptoms resolved  ? Prolapse of anterior vaginal wall   ? Tachycardia   ? Episodic  ? Upper back pain   ? Wears glasses for reading   ? Wears partial denture upper   ? ?Allergies  ?Allergen Reactions  ? Fenofibrate Other (See Comments)  ?  Abdominal pain  ? Penicillins Rash  ?  REACTION: Hives ?Anaphylaxis  ? ? ?Social History  ? ?Socioeconomic History  ? Marital status: Married  ?  Spouse name: Not on file  ? Number of children: 1  ? Years of education: Not on file  ? Highest education level: Not on file  ?Occupational History  ?  Occupation: Homemaker  ?  Employer: RETIRED  ?Tobacco Use  ? Smoking status: Never  ? Smokeless tobacco: Never  ?Vaping Use  ? Vaping Use: Never used  ?Substance and Sexual Activity  ? Alcohol use: Yes  ?  Comment:  occ  ? Drug use: Never  ? Sexual activity: Not Currently  ?  Comment: lives with husband  ?Other Topics Concern  ? Not on file  ?Social History Narrative  ? University - [place], Masters Degree - counseling, [employer]  ? Married '69 - 7 years, divorced; married '82  ? 32 daughter - '72; 2 grandchildren  ?   ? Regular Exercise -  YES  ?   ?   ? ?Social Determinants of Health  ? ?Financial Resource Strain: Low Risk   ? Difficulty  of Paying Living Expenses: Not hard at all  ?Food Insecurity: No Food Insecurity  ? Worried About Programme researcher, broadcasting/film/video in the Last Year: Never true  ? Ran Out of Food in the Last Year: Never true  ?Transportation Needs: No Transportation Needs  ? Lack of Transportation (Medical): No  ? Lack of Transportation (Non-Medical): No  ?Physical Activity: Insufficiently Active  ? Days of Exercise per Week: 2 days  ? Minutes of Exercise per Session: 30 min  ?Stress: No Stress Concern Present  ? Feeling of Stress : Not at all  ?Social Connections: Moderately Isolated  ? Frequency of Communication with Friends and Family: Three times a week  ? Frequency of Social Gatherings with Friends and Family: Three times a week  ? Attends Religious Services: Never  ? Active Member of Clubs or Organizations: No  ? Attends Banker Meetings: Never  ? Marital Status: Married  ? ?Vitals:  ? 11/06/21 1130  ?BP: 120/80  ?Pulse: 74  ?Resp: 16  ?SpO2: 96%  ? ?Body mass index is 23.13 kg/m?. ? ?Physical Exam ?Vitals and nursing note reviewed.  ?Constitutional:   ?   General: She is not in acute distress. ?   Appearance: She is well-developed.  ?HENT:  ?   Head: Normocephalic and atraumatic.  ?   Mouth/Throat:  ?   Mouth: Mucous membranes are moist.  ?   Pharynx: Oropharynx is clear.   ?Eyes:  ?   Conjunctiva/sclera: Conjunctivae normal.  ?Cardiovascular:  ?   Rate and Rhythm: Normal rate and regular rhythm.  ?   Pulses:     ?     Dorsalis pedis pulses are 2+ on the right side and 2+ on the left

## 2021-11-06 ENCOUNTER — Ambulatory Visit: Payer: Medicare PPO | Admitting: Family Medicine

## 2021-11-06 ENCOUNTER — Encounter: Payer: Self-pay | Admitting: Family Medicine

## 2021-11-06 VITALS — BP 120/80 | HR 74 | Resp 16 | Ht 59.0 in | Wt 114.5 lb

## 2021-11-06 DIAGNOSIS — R202 Paresthesia of skin: Secondary | ICD-10-CM | POA: Diagnosis not present

## 2021-11-06 DIAGNOSIS — I10 Essential (primary) hypertension: Secondary | ICD-10-CM

## 2021-11-06 DIAGNOSIS — R233 Spontaneous ecchymoses: Secondary | ICD-10-CM | POA: Diagnosis not present

## 2021-11-06 DIAGNOSIS — E782 Mixed hyperlipidemia: Secondary | ICD-10-CM

## 2021-11-06 DIAGNOSIS — I739 Peripheral vascular disease, unspecified: Secondary | ICD-10-CM | POA: Diagnosis not present

## 2021-11-06 DIAGNOSIS — G5603 Carpal tunnel syndrome, bilateral upper limbs: Secondary | ICD-10-CM | POA: Diagnosis not present

## 2021-11-06 LAB — VITAMIN B12: Vitamin B-12: 501 pg/mL (ref 211–911)

## 2021-11-06 LAB — CBC
HCT: 39.9 % (ref 36.0–46.0)
Hemoglobin: 13.7 g/dL (ref 12.0–15.0)
MCHC: 34.4 g/dL (ref 30.0–36.0)
MCV: 89 fl (ref 78.0–100.0)
Platelets: 275 10*3/uL (ref 150.0–400.0)
RBC: 4.49 Mil/uL (ref 3.87–5.11)
RDW: 12.8 % (ref 11.5–15.5)
WBC: 7.4 10*3/uL (ref 4.0–10.5)

## 2021-11-06 LAB — BASIC METABOLIC PANEL
BUN: 21 mg/dL (ref 6–23)
CO2: 27 mEq/L (ref 19–32)
Calcium: 10 mg/dL (ref 8.4–10.5)
Chloride: 102 mEq/L (ref 96–112)
Creatinine, Ser: 0.65 mg/dL (ref 0.40–1.20)
GFR: 84.78 mL/min (ref >60.00)
Glucose, Bld: 106 mg/dL — ABNORMAL HIGH (ref 70–99)
Potassium: 3.8 mEq/L (ref 3.5–5.1)
Sodium: 138 mEq/L (ref 135–145)

## 2021-11-06 NOTE — Patient Instructions (Addendum)
A few things to remember from today's visit: ? ?Paresthesia - Plan: CBC, Vitamin B12, Protein Electrophoresis, Urine Rflx., Basic metabolic panel ? ?Bilateral carpal tunnel syndrome ? ?PAD (peripheral artery disease) (HCC) ? ?Essential hypertension - Plan: Basic metabolic panel ? ?Hyperlipidemia, mixed ? ?If you need refills please call your pharmacy. ?Do not use My Chart to request refills or for acute issues that need immediate attention. ?  ?Please be sure medication list is accurate. ?If a new problem present, please set up appointment sooner than planned today. ? ?Numbness could be caused by carpal tunnel synd (hands) or B12 low. ? ?If worse we can arrange neuro appt. ?Continue same blood pressure meds. ?Continue Aspirin and cholesterol medications for circulation. ? ?Monitor for new symptoms. ? ?S?ndrome del t?nel carpiano ?Carpal Tunnel Syndrome ? ?El s?ndrome del t?nel carpiano es una afecci?n que causa dolor, adormecimiento y debilidad en la mano y los dedos. El t?nel carpiano es un ?rea estrecha ubicada en el lado palmar de la mu?eca. Los movimientos repetidos de la mu?eca o determinadas enfermedades pueden causar la hinchaz?n del t?nel. Esta hinchaz?n comprime el nervio principal de la mu?eca. El nervio principal de la mu?eca se llama ?nervio mediano?Marland Kitchen ??Cu?les son las causas? ?Esta afecci?n puede ser causada por lo siguiente: ?Movimientos repetidos y en?rgicos de la mu?eca y la [place]. ?Lesiones en la mu?eca. ?Artritis. ?Un quiste o un tumor en el t?nel carpiano. ?Acumulaci?n de l?quido durante el embarazo. ?Uso de herramientas que vibran. ?A veces, se desconoce la causa de esta afecci?n. ??Qu? incrementa el riesgo? ?Los siguientes factores pueden hacer que sea m?s propenso a Aeronautical engineer afecci?n: ?Tener un trabajo que requiera que mueva la mu?eca o la mano repetitivamente o en?rgicamente o que utilice herramientas que vibran. Estos pueden ser, [employer], los trabajos que implican usar una  computadora, trabajar en una l?nea de ensamblaje o trabajar con herramientas el?ctricas como taladros y lijadoras. ?Ser mujer. ?Tener ciertas afecciones, tales como: ?Diabetes. ?Obesidad. ?Tiroides hipoactiva (hipotiroidismo). ?Insuficiencia renal. ?Artritis reumatoide. ??Cu?les son los signos o s?ntomas? ?Los s?ntomas de esta afecci?n incluyen: ?Sensaci?n de hormigueo en los dedos de la mano, Dentist, el ?ndice y el dedo [place]. ?Hormigueo o adormecimiento en la mano. ?Sensaci?n de dolor en todo el brazo, especialmente cuando la mu?eca y el codo est?n flexionados durante mucho tiempo. ?Dolor en la mu?eca que sube por el brazo hasta el hombro. ?Dolor que baja hasta la palma de la mano o los dedos. ?Sensaci?n de debilidad en las manos. Tal vez tenga dificultad para tomar y Personal assistant. ?Los s?ntomas pueden empeorar durante la noche. ??C?mo se diagnostica? ?Esta afecci?n se diagnostica mediante los antecedentes m?dicos y un examen f?sico. Tambi?n pueden hacerle estudios, que incluyen los siguientes: ?Un electromiograma (EMG). Esta prueba mide las se?ales el?ctricas que los nervios les env?an a los m?sculos. ?Estudio de [employer]. Este estudio permite determinar si las se?ales el?ctricas pasan correctamente por los nervios. ?Estudios de diagn?stico por im?genes, como radiograf?as, una ecograf?a y Neill Loft magn?tica (RM). Estos estudios Education officer, community las posibles causas de la afecci?n. ??C?mo se trata? ?El tratamiento de esta afecci?n puede incluir: ?Cambios en el estilo de vida. Es importante que deje o cambie la actividad que caus? la afecci?n. ?Hacer ejercicio y actividades para fortalecer y Furniture conservator/restorer los m?sculos y los tendones (fisioterapia). ?Hacer cambios en el estilo de vida que lo ayuden con su afecci?n y aprender a Education officer, environmental sus actividades diarias de forma segura (terapia ocupacional). ?Analg?sicos y antiinflamatorios. Esto puede incluir  medicamentos que se inyectan en la  mu?eca. ?Una f?rula o un dispositivo ortop?dico para la mu?eca. ?Cirug?a. ?Siga estas instrucciones en su casa: ?Si tiene una f?rula o un dispositivo ortop?dico: ?Use la f?rula o el dispositivo ortop?dico como se lo haya indicado el m?dico. Qu?teselos solamente como se lo haya indicado el m?dico. ?Afloje la f?rula o el dispositivo ortop?dico si los dedos de las manos se le adormecen, siente hormigueos o se le enfr?an y se tornan de Research officer, trade union. ?Mantenga la f?rula o el dispositivo ortop?dico limpios. ?Si la f?rula o el dispositivo ortop?dico no son impermeables: ?No deje que se mojen. ?C?bralos con un envoltorio herm?tico cuando tome un ba?o de inmersi?n o una ducha. ?Control del dolor, la rigidez y la hinchaz?n ?Si se lo indican, aplique hielo sobre la zona dolorida. Para hacer esto: ?Si tiene una f?rula o un dispositivo ortop?dico desmontable, qu?teselos como se lo haya indicado el m?dico. ?Ponga el hielo en una bolsa pl?stica. ?Coloque una [employer] piel y la [place], o entre la f?rula o dispositivo ortop?dico y Copy. ?Aplique el hielo durante 20 minutos, 2 o 3 veces por d?a. No se quede dormido con la bolsa de hielo [employer]. ?Retire el hielo si la piel se pone de color rojo brillante. Esto es [employer]. Si no puede sentir dolor, calor o fr?o, tiene un mayor riesgo de que se da?e la zona. ?Mueva los dedos con frecuencia para reducir la rigidez y la hinchaz?n. ?Instrucciones generales ?Use los medicamentos de venta libre y los recetados solamente como se lo haya indicado el m?dico. ?Descanse la mu?eca y la mano de toda actividad que le cause dolor. Si la afecci?n tiene relaci?n con Kathie Dike, hable con su empleador [employer] pueden [place], por [place], usar una almohadilla para apoyar la mu?eca mientras tipea. ?Haga los ejercicios como se lo hayan indicado el m?dico, el fisioterapeuta o el terapeuta ocupacional. ?Cumpla con todas las visitas de seguimiento. Esto es  importante. ?Comun?quese con un m?dico si: ?Aparecen nuevos s?ntomas. ?El dolor no se alivia con los [employer]. ?Sus s?ntomas empeoran. ?Solicite ayuda de inmediato si: ?Tiene hormigueo o adormecimiento intensos en la mu?eca o la mano. ?Resumen ?El s?ndrome del t?nel carpiano es una afecci?n que causa dolor, adormecimiento y debilidad en la mano y los dedos. ?Generalmente se debe a movimientos repetidos de la mu?eca. ?El s?ndrome del t?nel carpiano se trata mediante cambios en el estilo de vida y medicamentos. Tambi?n puede indicarse la cirug?a. ?Siga las instrucciones del m?dico sobre el uso de una f?rula, el descanso de la [place], la asistencia a las visitas de seguimiento y Freight forwarder para pedir ayuda. ?Esta informaci?n no tiene Theme park manager el consejo del m?dico. Aseg?rese de hacerle al m?dico cualquier pregunta que tenga. ?Document Revised: 12/21/2019 Document Reviewed: 12/21/2019 ?Elsevier Patient Education ? 2023 Elsevier Inc. ? ? ? ?

## 2021-11-11 LAB — PROTEIN ELECTROPHORESIS, URINE REFLEX
Albumin ELP, Urine: 100 %
Alpha-1-Globulin, U: 0 %
Alpha-2-Globulin, U: 0 %
Beta Globulin, U: 0 %
Gamma Globulin, U: 0 %
Protein, Ur: <4 mg/dL

## 2021-11-30 ENCOUNTER — Other Ambulatory Visit: Payer: Self-pay | Admitting: Family Medicine

## 2021-11-30 DIAGNOSIS — I10 Essential (primary) hypertension: Secondary | ICD-10-CM

## 2021-12-01 ENCOUNTER — Ambulatory Visit: Payer: Medicare PPO | Admitting: Cardiovascular Disease

## 2021-12-01 ENCOUNTER — Encounter: Payer: Self-pay | Admitting: Cardiovascular Disease

## 2021-12-01 VITALS — BP 130/77 | HR 67 | Ht 59.0 in | Wt 114.8 lb

## 2021-12-01 DIAGNOSIS — I10 Essential (primary) hypertension: Secondary | ICD-10-CM

## 2021-12-01 DIAGNOSIS — I739 Peripheral vascular disease, unspecified: Secondary | ICD-10-CM | POA: Diagnosis not present

## 2021-12-01 DIAGNOSIS — E785 Hyperlipidemia, unspecified: Secondary | ICD-10-CM | POA: Diagnosis not present

## 2021-12-01 NOTE — Progress Notes (Signed)
?  ?Cardiology Office Note ? ? ?Date:  12/01/2021  ? ?ID:  Eileen Hansen, DOB 09-22-1945, MRN [id number] ? ?PCP:  Martinique, [name], MD  ?Cardiologist: Dr. Johney Frame ? ?No chief complaint on file. ? ? ? ?  ?History of Present Illness: ?Eileen Hansen is a 76 y.o. female who is here today for follow-up visit regarding peripheral arterial disease.   ?She has known history of mild mitral and aortic regurgitation, essential hypertension, diet-controlled diabetes and hyperlipidemia.  Carotid Doppler in September of 2022 showed mild nonobstructive disease. ?She was seen few months ago for bilateral calf claudication worse on the right side. ?Lower extremity arterial Doppler showed an ABI of 0.81 on the right and 0.91 on the left.  Duplex showed significant stenosis in the right mid SFA and moderate disease affecting the left SFA. ? ?She has been doing reasonably well overall with no chest pain.  She reports intermittent episodes of shortness of breath at night.  Lower extremity claudication is stable and if anything seems to be better than before.  She has has not been able to do much walking lately due to right knee arthritis but has been using stationary bike. ? ?Past Medical History:  ?Diagnosis Date  ? Allergic rhinitis   ? Arthralgia 09/30/2015  ? Arthritis   ? fingers  ? Bruises easily and skin thin 09/11/2021  ? Chronic  bilateral shoulder pain left shoulder limited rom 12/07/2016  ? Heart murmur 12/07/2016  ? HTN (hypertension)   ? Lexiscan Myoview 04/2021   ? Myoview 10/22: EF 78, no ischemia or infarction; low risk  ? mild Aortic valve insufficiency   ? Mild Mitral valve regurgitation   ? PAD (peripheral artery disease) ([place])   ? Pain in the wrist, unspecified laterality 03/03/2014  ? Personal history of COVID-19 04/2021  ? mild symptoms x 1 week all symptoms resolved  ? Prolapse of anterior vaginal wall   ? Tachycardia   ? Episodic  ? Upper back pain   ? Wears glasses for reading   ? Wears partial denture upper    ? ? ?Past Surgical History:  ?Procedure Laterality Date  ? ANTERIOR AND POSTERIOR REPAIR WITH SACROSPINOUS FIXATION N/A 09/14/2021  ? Procedure: ANTERIOR REPAIR WITH SACROSPINOUS FIXATION;  Surgeon: Jaquita Folds, MD;  Location: [facility];  Service: Gynecology;  Laterality: N/A;  ? APPENDECTOMY  76 yrs old  ? BUNIONECTOMY Bilateral   ? more than 40 yrs ago  ? colonscopy  2018  ? CYSTOSCOPY N/A 09/14/2021  ? Procedure: CYSTOSCOPY;  Surgeon: Jaquita Folds, MD;  Location[facility];  Service: Gynecology;  Laterality: N/A;  ? WISDOM TOOTH EXTRACTION  76 yrs old  ? ? ? ?Current Outpatient Medications  ?Medication Sig Dispense Refill  ? aspirin EC 81 MG tablet Take 81 mg by mouth daily. Swallow whole.    ? carvedilol (COREG) 3.125 MG tablet Take 1 tablet (3.125 mg total) by mouth 2 (two) times daily. 180 tablet 3  ? Cholecalciferol (VITAMIN D3) 25 MCG (1000 UT) CAPS Take 1 capsule by mouth daily.    ? COLLAGEN PO Take by mouth daily.    ? hydrochlorothiazide (HYDRODIURIL) 25 MG tablet TAKE ONE TABLET BY MOUTH ONE TIME DAILY 90 tablet 2  ? lisinopril (ZESTRIL) 10 MG tablet Take 2 tablets (20 mg total) by mouth daily. (Patient taking differently: Take 10 mg by mouth 2 (two) times daily.) 180 tablet 2  ? Magnesium 200 MG  TABS Take 1 tablet (200 mg total) by mouth at bedtime. (Patient taking differently: Take 240 mg by mouth at bedtime. Magnesium glyconate takes 240 mg 2 tabs at hs) 30 tablet 11  ? omega-3 acid ethyl esters (LOVAZA) 1 g capsule Take 1 capsule (1 g total) by mouth 2 (two) times daily. 180 capsule 3  ? rosuvastatin (CRESTOR) 20 MG tablet Take 1 tablet (20 mg total) by mouth daily. 90 tablet 3  ? vitamin B-12 (CYANOCOBALAMIN) 1000 MCG tablet Take 1,000 mcg by mouth daily.    ? ?No current facility-administered medications for this visit.  ? ? ?Allergies:   Fenofibrate and Penicillins  ? ? ?Social History:  The patient  reports that she has never smoked. She has  never used smokeless tobacco. She reports current alcohol use. She reports that she does not use drugs.  ? ?Family History:  The patient's family history includes Cancer in her brother and father; Diabetes in her mother; Heart attack in her brother and mother; Hyperlipidemia in her mother; Hypertension in her mother; Stroke in her mother.  ? ? ?ROS:  Please see the history of present illness.   Otherwise, review of systems are positive for none.   All other systems are reviewed and negative.  ? ? ?PHYSICAL EXAM: ?VS:  BP 130/77   Pulse 67   Ht 4\' 11"  (1.499 m)   Wt 114 lb 12.8 oz (52.1 kg)   SpO2 97%   BMI 23.19 kg/m?  , BMI Body mass index is 23.19 kg/m?. ?GEN: Well nourished, well developed, in no acute distress  ?HEENT: normal  ?Neck: no JVD, carotid bruits, or masses ?Cardiac: RRR; no murmurs, rubs, or gallops,no edema  ?Respiratory:  clear to auscultation bilaterally, normal work of breathing ?GI: soft, nontender, nondistended, + BS ?MS: no deformity or atrophy  ?Skin: warm and dry, no rash ?Neuro:  Strength and sensation are intact ?Psych: euthymic mood, full affect ?Vascular: Radial pulse:normal bilaterally.  Femoral: Normal bilaterally.  Distal pulses are +1 on the right and +2 on the left. ? ? ?EKG:  EKG is not ordered today. ? ? ? ?Recent Labs: ?07/30/2021: ALT 11 ?08/25/2021: TSH 2.28 ?11/06/2021: BUN 21; Creatinine, Ser 0.65; Hemoglobin 13.7; Platelets 275.0; Potassium 3.8; Sodium 138  ? ? ?Lipid Panel ?   ?Component Value Date/Time  ? CHOL 103 07/30/2021 1006  ? TRIG 84 07/30/2021 1006  ? HDL 32 (L) 07/30/2021 1006  ? CHOLHDL 3.2 07/30/2021 1006  ? CHOLHDL 4 02/18/2021 1005  ? VLDL 26.4 02/18/2021 1005  ? LDLCALC 54 07/30/2021 1006  ? LDLCALC 64 03/26/2020 1259  ? LDLDIRECT 152.0 06/25/2019 1536  ? ?  ? ?Wt Readings from Last 3 Encounters:  ?12/01/21 114 lb 12.8 oz (52.1 kg)  ?11/06/21 114 lb 8 oz (51.9 kg)  ?09/14/21 116 lb 11.2 oz (52.9 kg)  ?  ? ? ?   ? View : No data to display.  ?  ?  ?   ? ? ? ? ?ASSESSMENT AND PLAN: ? ?1.  Peripheral arterial disease with intermittent claudication: The patient continues to have moderate none lifestyle limiting claudication of the right calf.  Symptoms are slightly improved from before.  Recommend continuing medical therapy and an exercise program.   ? ?2.  Essential hypertension: Blood pressure is reasonably controlled. ? ?3.  Hyperlipidemia: I reviewed most recent lipid profile done in January which showed an LDL of 54.  Continue treatment with rosuvastatin. ? ?4.  Shortness of breath at  night: No evidence of volume overload by exam.  Cardiac work-up last year including an echocardiogram and a nuclear stress test were both unremarkable. ? ? ? ?Disposition:   FU with me in 6 months ? ?Signed, ? ?Kathlyn Sacramento, MD  ?12/01/2021 10:56 AM    ?[place] ? ?

## 2021-12-01 NOTE — Patient Instructions (Signed)
Medication Instructions:  °No changes °*If you need a refill on your cardiac medications before your next appointment, please call your pharmacy* ° ° °Lab Work: °None ordered °If you have labs (blood work) drawn today and your tests are completely normal, you will receive your results only by: °MyChart Message (if you have MyChart) OR °A paper copy in the mail °If you have any lab test that is abnormal or we need to change your treatment, we will call you to review the results. ° ° °Testing/Procedures: °None ordered ° ° °Follow-Up: °At [facility], you and your health needs are our priority.  As part of our continuing mission to provide you with exceptional heart care, we have created designated Provider Care Teams.  These Care Teams include your primary Cardiologist (physician) and Advanced Practice Providers (APPs -  Physician Assistants and Nurse Practitioners) who all work together to provide you with the care you need, when you need it. ° °We recommend signing up for the patient portal called "MyChart".  Sign up information is provided on this After Visit Summary.  MyChart is used to connect with patients for Virtual Visits (Telemedicine).  Patients are able to view lab/test results, encounter notes, upcoming appointments, etc.  Non-urgent messages can be sent to your provider as well.   °To learn more about what you can do with MyChart, go to [email or web address].   ° °Your next appointment:   °6 month(s) ° °The format for your next appointment:   °In Person ° °Provider:   °Dr. Arida ° °

## 2022-01-27 ENCOUNTER — Other Ambulatory Visit: Payer: Self-pay | Admitting: Family Medicine

## 2022-02-05 ENCOUNTER — Ambulatory Visit: Payer: Medicare PPO | Admitting: Physician Assistant

## 2022-02-05 DIAGNOSIS — M25562 Pain in left knee: Secondary | ICD-10-CM | POA: Diagnosis not present

## 2022-02-15 ENCOUNTER — Ambulatory Visit: Payer: Medicare PPO | Admitting: Cardiology

## 2022-02-15 NOTE — Progress Notes (Signed)
ACUTE VISIT Chief Complaint  Patient presents with   Knee Pain    Left knee   HPI: [name] is a 76 y.o. female, who is here today to discuss a few concerns.  Arthralgias of left knee, hands,and neck mainly No associated edema or erythema.  Neck Pain  This is a new problem. The current episode started in the past 7 days. The problem occurs intermittently. The problem has been unchanged. The pain is associated with an unknown factor. The pain is present in the left side. The quality of the pain is described as aching. The pain is moderate. Stiffness is present All day. Pertinent negatives include no fever, pain with swallowing, syncope, visual change, weakness or weight loss. She has tried nothing for the symptoms.  She is worried that neck pain could caused by carotid artery disease. No new pillow. Does not sleep well, from 1 am to 8-9 am. Carotid duplex in 03/2021:  Right Carotid: Velocities in the right ICA are consistent with a 1-39% stenosis.  Left Carotid: Velocities in the left ICA are consistent with a 1-39% stenosis.   Having "sporadic" muscle aching pain all over. She wonders if this could be caused by statin medication. Follows with ortho for knee OA. She has received intra articular steroid injections in right knee and hyaluronic acid. Treatments have not help with left knee pain. No hx of trauma.  Pain is exacerbated by prolonged walking. She has questions about Glucosamine and Mg supplementation, the latter one recommended by her cardiologist.  She has had right calf pain, which has been attributed to PAD. She follows with vascular, next follow up in 2 weeks.  She is on Rosuvastatin 20 mg daily and Aspirin 81 mg daily. HyperTG she is on Lovaza 1 g bid.  Lab Results  Component Value Date   CHOL 103 07/30/2021   HDL 32 (L) 07/30/2021   LDLCALC 54 07/30/2021   LDLDIRECT 152.0 06/25/2019   TRIG 84 07/30/2021   CHOLHDL 3.2 07/30/2021   HTN, she wonders  if she needs to continue taking antihypertensive medications. She is concerned because she has been on same medications for years. Home BP's 130-140/80's. She is on HCTZ 25 mg daily,Lisinopril 10 mg daily,and Carvedilol 3.125 mg bid.  She follows with cardiologist. Negative for severe/frequent headache, visual changes, chest pain, dyspnea,focal weakness, or edema.  Lab Results  Component Value Date   CREATININE 0.65 11/06/2021   BUN 21 11/06/2021   NA 138 11/06/2021   K 3.8 11/06/2021   CL 102 11/06/2021   CO2 27 11/06/2021   Review of Systems  Constitutional:  Positive for activity change and fatigue. Negative for appetite change, chills, fever and weight loss.  HENT:  Negative for mouth sores, nosebleeds and sore throat.   Respiratory:  Negative for cough and wheezing.   Cardiovascular:  Negative for syncope.  Gastrointestinal:  Negative for abdominal pain, nausea and vomiting.       Negative for changes in bowel habits.  Endocrine: Negative for cold intolerance and heat intolerance.  Genitourinary:  Negative for decreased urine volume, dysuria and hematuria.  Musculoskeletal:  Positive for arthralgias, myalgias and neck pain.  Skin:  Negative for rash.  Neurological:  Negative for syncope, facial asymmetry and weakness.  Psychiatric/Behavioral:  Positive for sleep disturbance. Negative for confusion.   Rest see pertinent positives and negatives per HPI.  Current Outpatient Medications on File Prior to Visit  Medication Sig Dispense Refill   aspirin EC  81 MG tablet Take 81 mg by mouth daily. Swallow whole.     carvedilol (COREG) 3.125 MG tablet Take 1 tablet (3.125 mg total) by mouth 2 (two) times daily. 180 tablet 3   Cholecalciferol (VITAMIN D3) 25 MCG (1000 UT) CAPS Take 1 capsule by mouth daily.     COLLAGEN PO Take by mouth daily.     hydrochlorothiazide (HYDRODIURIL) 25 MG tablet TAKE ONE TABLET BY MOUTH ONE TIME DAILY 90 tablet 2   lisinopril (ZESTRIL) 10 MG tablet TAKE  TWO TABLETS BY MOUTH DAILY 180 tablet 2   Magnesium 200 MG TABS Take 1 tablet (200 mg total) by mouth at bedtime. (Patient taking differently: Take 240 mg by mouth at bedtime. Magnesium glyconate takes 240 mg 2 tabs at hs) 30 tablet 11   omega-3 acid ethyl esters (LOVAZA) 1 g capsule Take 1 capsule (1 g total) by mouth 2 (two) times daily. 180 capsule 3   rosuvastatin (CRESTOR) 20 MG tablet Take 1 tablet (20 mg total) by mouth daily. 90 tablet 3   vitamin B-12 (CYANOCOBALAMIN) 1000 MCG tablet Take 1,000 mcg by mouth daily.     No current facility-administered medications on file prior to visit.   Past Medical History:  Diagnosis Date   Allergic rhinitis    Arthralgia 09/30/2015   Arthritis    fingers   Bruises easily and skin thin 09/11/2021   Chronic  bilateral shoulder pain left shoulder limited rom 12/07/2016   Heart murmur 12/07/2016   HTN (hypertension)    Lexiscan Myoview 04/2021    Myoview 10/22: EF 78, no ischemia or infarction; low risk   mild Aortic valve insufficiency    Mild Mitral valve regurgitation    PAD (peripheral artery disease) ([facility])    Pain in the wrist, unspecified laterality 03/03/2014   Personal history of COVID-19 04/2021   mild symptoms x 1 week all symptoms resolved   Prolapse of anterior vaginal wall    Tachycardia    Episodic   Upper back pain    Wears glasses for reading    Wears partial denture upper    Allergies  Allergen Reactions   Fenofibrate Other (See Comments)    Abdominal pain   Penicillins Rash    REACTION: Hives Anaphylaxis    Social History   Socioeconomic History   Marital status: Married    Spouse name: Not on file   Number of children: 1   Years of education: Not on file   Highest education level: Not on file  Occupational History   Occupation: Homemaker    Employer: RETIRED  Tobacco Use   Smoking status: Never   Smokeless tobacco: Never  Vaping Use   Vaping Use: Never used  Substance and Sexual Activity   Alcohol  use: Yes    Comment:  occ   Drug use: Never   Sexual activity: Not Currently    Comment: lives with husband  Other Topics Concern   Not on file  Social History Narrative   University - [place], Masters Degree - counseling, [employer]   Married '69 - 7 years, divorced; married '82   21 daughter - '72; 2 grandchildren      Regular Exercise -  YES         Social Determinants of Health   Financial Resource Strain: Low Risk  (02/13/2021)   Overall Financial Resource Strain (CARDIA)    Difficulty of Paying Living Expenses: Not hard at all  Food Insecurity: No Food  Insecurity (02/13/2021)   Hunger Vital Sign    Worried About Running Out of Food in the Last Year: Never true    Ran Out of Food in the Last Year: Never true  Transportation Needs: No Transportation Needs (02/13/2021)   PRAPARE - Hydrologist (Medical): No    Lack of Transportation (Non-Medical): No  Physical Activity: Insufficiently Active (02/13/2021)   Exercise Vital Sign    Days of Exercise per Week: 2 days    Minutes of Exercise per Session: 30 min  Stress: No Stress Concern Present (02/13/2021)   [place]    Feeling of Stress : Not at all  Social Connections: Moderately Isolated (02/13/2021)   Social Connection and Isolation Panel [NHANES]    Frequency of Communication with Friends and Family: Three times a week    Frequency of Social Gatherings with Friends and Family: Three times a week    Attends Religious Services: Never    Active Member of Clubs or Organizations: No    Attends Archivist Meetings: Never    Marital Status: Married   Vitals:   02/16/22 1447  BP: 126/80  Pulse: 85  Resp: 12  SpO2: 98%   Body mass index is 23.33 kg/m.  Physical Exam Vitals and nursing note reviewed.  Constitutional:      General: She is not in acute distress.    Appearance: She is well-developed.   HENT:     Head: Normocephalic and atraumatic.     Mouth/Throat:     Mouth: Mucous membranes are moist.     Pharynx: Oropharynx is clear.  Eyes:     Conjunctiva/sclera: Conjunctivae normal.  Cardiovascular:     Rate and Rhythm: Normal rate and regular rhythm.     Pulses:          Dorsalis pedis pulses are 2+ on the right side and 2+ on the left side.     Heart sounds: Murmur (SEM I/VI RUSB) heard.  Pulmonary:     Effort: Pulmonary effort is normal. No respiratory distress.     Breath sounds: Normal breath sounds.  Abdominal:     Palpations: Abdomen is soft. There is no hepatomegaly or mass.     Tenderness: There is no abdominal tenderness.  Musculoskeletal:     Right shoulder: Tenderness present. Decreased range of motion.     Left shoulder: Tenderness present. Decreased range of motion.     Cervical back: No crepitus. Pain with movement and muscular tenderness present. Decreased range of motion.     Thoracic back: No tenderness or bony tenderness.     Lumbar back: No tenderness or bony tenderness.     Right lower leg: No edema.     Left lower leg: No edema.  Lymphadenopathy:     Cervical: No cervical adenopathy.  Skin:    General: Skin is warm.     Findings: No erythema or rash.  Neurological:     General: No focal deficit present.     Mental Status: She is alert and oriented to person, place, and time.     Cranial Nerves: No cranial nerve deficit.     Gait: Gait normal.  Psychiatric:     Comments: Well groomed, good eye contact.   ASSESSMENT AND PLAN:  Ms. Delania was seen today for knee pain.  Diagnoses and all orders for this visit: Orders Placed This Encounter  Procedures   Ambulatory referral to  Physical Therapy   PAD (peripheral artery disease) ([facility]) We discussed Dx, prognosis,and treatment recommendations. Continue Rosuvastatin and Aspirin. Has an appt with vascular in 2 weeks.  Essential hypertension BP has been adequately controlled. If we stop meds, BP  most likely will go up. We discussed possible compkications of elevated BP. Recommend continuing HCTZ,Lisinopril,and Carvedilol same dose. Continue low salt diet. Continue monitoring BP regularly.  Osteoarthritis of multiple joints, unspecified osteoarthritis type Glucosamine sulfate 1500 mg daily may help with knee OA, she can try for 3 months.  She is already on Lovaza, which also may help with arthralgias. Tylenol 500 mg 3-4 times per day as needed. Continue following with ortho for knee OA.  Hyperlipidemia, mixed LDL is at goal, 54 in 07/2021. We discussed CV benefits of statins as well as some side effects. She could try lower dose of Rosuvastatin and monitor for changes in arthralgias and myalgias.  Neck pain Hx and examination do not suggest a serious process. Local massage, icy hot,and ROM exercises may help. PT will be arranged. I do not think imaging is needed at this time.  Myalgia Statin can be a contributing factor, she could hold on Rosuvastatin for a couple weeks and monitor for changes. If in fact statin is causing pain, we can try a different one, zetia, PCSK-9 inh, or bempedoic acid. Tai chi may help with arthralgias and Myalgias.  I spent a total of 46 minutes in both face to face and non face to face activities for this visit on the date of this encounter. During this time history was obtained and documented, examination was performed, prior labs/imaging reviewed, and assessment/plan discussed.  Return in about 5 months (around 07/19/2022).  Kati Riggenbach G. [place], MD  [facility]. [place] office.

## 2022-02-16 ENCOUNTER — Ambulatory Visit: Payer: Medicare PPO | Admitting: Family Medicine

## 2022-02-16 ENCOUNTER — Encounter: Payer: Self-pay | Admitting: Family Medicine

## 2022-02-16 VITALS — BP 126/80 | HR 85 | Resp 12 | Ht 59.0 in | Wt 115.5 lb

## 2022-02-16 DIAGNOSIS — I10 Essential (primary) hypertension: Secondary | ICD-10-CM | POA: Diagnosis not present

## 2022-02-16 DIAGNOSIS — E782 Mixed hyperlipidemia: Secondary | ICD-10-CM

## 2022-02-16 DIAGNOSIS — M159 Polyosteoarthritis, unspecified: Secondary | ICD-10-CM | POA: Diagnosis not present

## 2022-02-16 DIAGNOSIS — I739 Peripheral vascular disease, unspecified: Secondary | ICD-10-CM

## 2022-02-16 DIAGNOSIS — M791 Myalgia, unspecified site: Secondary | ICD-10-CM

## 2022-02-16 DIAGNOSIS — M19041 Primary osteoarthritis, right hand: Secondary | ICD-10-CM

## 2022-02-16 DIAGNOSIS — M542 Cervicalgia: Secondary | ICD-10-CM

## 2022-02-16 NOTE — Patient Instructions (Addendum)
A few things to remember from today's visit:  PAD (peripheral artery disease) ([place])  Essential hypertension  Osteoarthritis of both hands, unspecified osteoarthritis type  Hyperlipidemia, mixed  Neck pain  Myalgia  If you need refills please call your pharmacy. Do not use My Chart to request refills or for acute issues that need immediate attention.  Please be sure medication list is accurate. If a new problem present, please set up appointment sooner than planned today. Esguince cervical Cervical Sprain Al esguince cervical tambin se le denomina "esguince de cuello". Es Ardelia Mems distensin o un desgarro en uno o ms ligamentos del cuello. Los ligamentos son tejidos que [employer]. Los esguinces de cuello pueden ser leves, graves o [place] graves. Un esguince muy grave en el cuello puede hacer que los huesos del cuello se vuelvan inestables. Esto puede daar la mdula espinal. Tambin puede causar problemas graves en el cerebro, la mdula espinal y los nervios (sistema nervioso). La mayora de los esguinces de cuello se curan en 4 a 6 semanas. Pueden tardar ms o menos tiempo dependiendo de lo siguiente: Qu caus la lesin. La gravedad de la lesin. Cules son las causas? Los esguinces de cuello pueden deberse a un traumatismo, por ejemplo: Una lesin por un accidente en un vehculo, como un automvil o una embarcacin. Una cada. Un movimiento sbito de la cabeza y el cuello de adelante hacia atrs o de un lado a otro (lesin del latigazo cervical). Los esguinces de cuello leves pueden deberse al desgaste que se produce con el paso del tiempo. Qu incrementa el riesgo? Los siguientes factores pueden hacer que sea ms propenso a Emergency planning/management officer afeccin: Doctor, general practice que lo ponen en alto riesgo de lastimarse el cuello. Esto incluye lo siguiente: Deportes de contacto. Carreras de automviles. Hacer gimnasia. Buceo. Asumir riesgos al conducir o [employer] en un  vehculo, como un automvil o una embarcacin. Artritis causada por el desgaste de las articulaciones de la columna vertebral. El cuello no es muy fuerte ni flexible. Haber tenido una lesin en el cuello en el pasado. Mala postura. Pasar mucho tiempo en ciertas posiciones que sobrecargan el cuello. Esto puede ser por estar sentado delante de una computadora durante [employer]. Cules son los signos o sntomas? Los sntomas de esta afeccin incluyen: El cuello, los hombros o la parte superior de la espalda se sienten: Adoloridos o con molestias. Rgidos. Sensibles. Hinchados. Calientes o con sensacin de [employer]. Rigidez repentina de los msculos del cuello (espasmos). Imposibilidad de Astronomer cuello. Dolor de [place]. Sensacin de [employer]. Tener ganas de vomitar, o vomitar. Tener una mano o un brazo que: Se siente dbil. Pierde la sensibilidad (est adormecido). Hormigueos. Podr tener sntomas inmediatamente despus de la lesin o estos podrn aparecer con el paso de [employer]. En algunos casos, los sntomas pueden desaparecer con tratamiento y regresar ms adelante. Cmo se trata? Esta afeccin se trata de la siguiente manera: Mantener el cuello en reposo. Aplicar hielo en la zona del cuello que est lastimada. Hacer ejercicios para recuperar el movimiento y la fuerza del cuello (fisioterapia). Si no hay hinchazn, usted puede usar terapia con calor de 2 a 3 das despus de ocurrida la lesin. Si la lesin es muy grave, el tratamiento tambin puede incluir: Contractor cuello en la misma posicin durante un [place]. Esto se puede hacer mediante lo siguiente: Un collarn. Este sostiene el mentn y la parte posterior del cuello. Un dispositivo de traccin cervical. Se trata de un  cabestrillo que sostiene el cuello. El cabestrillo elimina el peso y la presin del cuello. Tambin puede ayudar a Engineer, materials. Medicamentos para lo siguiente: Engineer, mining. Irritacin e  hinchazn(inflamacin). Medicamentos que [employer] msculos (relajantes musculares). Ciruga. Esto es poco frecuente. Siga estas instrucciones en su casa: Medicamentos  Use los medicamentos de venta libre y los recetados solamente como se lo haya indicado el mdico. Consulte a su mdico si el medicamento que le recetaron: Hace necesario que evite conducir o usar maquinaria pesada. Puede causarle dificultad para defecar (estreimiento). Es posible que deba tomar medidas para prevenir o tratar los problemas para defecar: Product manager suficiente lquido para Radio producer pis (la orina) de color amarillo plido. Usar medicamentos recetados o de Sales promotion account executive. [place] alimentos ricos en fibra. Entre ellos, frijoles, cereales integrales y frutas y verduras frescas. Limitar los alimentos con alto contenido de grasa y International aid/development worker. Estos incluyen alimentos fritos o dulces. Si tiene un collarn para el cuello: selos como se lo haya indicado el mdico. No se lo quite a menos que se lo indiquen. Consulte con el mdico antes de ajustarlo. Si tiene [employer], mantngalo fuera del collarn. Pregntele al mdico si puede quitarse el collarn para la limpieza y el bao. Si puede quitarse el collarn: Siga las instrucciones sobre cmo quitrselo de [place] segura. Lvelo a mano con agua y [place]. Deje que se seque al aire por completo. Si el collarn tiene almohadillas que se pueden retirar: Saque las almohadillas cada 1 o 2 das. Lvelas a mano con agua y [place]. Djelas secar con el aire por completo antes de volver a ponerlas en el collarn. Informe al mdico si tiene irritacin o llagas en la piel debajo del collarn. Control del dolor, la rigidez y la hinchazn     Si el mdico se lo indica, use un dispositivo de traccin cervical. Si se lo indican, aplique hielo en la zona afectada. Para hacer esto: Ponga el hielo en una bolsa plstica. Coloque una toalla entre la piel y Copy. Aplique el  hielo durante 20 minutos, 2 a 3 veces por da. Si se lo indican, aplique calor en la zona afectada. Haga esto antes de Materials engineer, o con la frecuencia que le haya indicado el mdico. Use la fuente de calor que el mdico le recomiende, como una compresa de calor hmedo o una almohadilla trmica. Coloque una toalla entre la piel y la fuente de Airline pilot. Aplique calor durante 20 a 30 minutos. Retire la fuente de calor si la piel se le pone de color rojo brillante. Esto es muy importante si no puede sentir el dolor, el calor o el fro. Puede correr un riesgo mayor de sufrir quemaduras. Actividad No conduzca vehculos mientras [place] el collar. Si no tiene un collarn para el cuello, pregunte si es seguro que conduzca durante el proceso de curacin del cuello. No levante ningn objeto que pese ms de 10 libras (4.5 kg) o el lmite de peso que le indiquen hasta que el mdico le diga que puede [place]. Haga reposo como se lo haya indicado el mdico. Haga ejercicios como se lo hayan indicado el mdico o el fisioterapeuta. Retome sus actividades normales segn lo indicado por el mdico. Evite las posiciones y las actividades que lo hagan Chief Operating Officer. Pregntele al mdico qu actividades son seguras para usted. Instrucciones generales No consuma ningn producto que contenga nicotina o tabaco, como cigarrillos, cigarrillos electrnicos y tabaco de Theatre manager. Estos pueden retrasar la recuperacin. Si necesita  ayuda para dejar de fumar, consulte al mdico. Concurra a todas las visitas de seguimiento como se lo haya indicado el mdico o el fisioterapeuta. Esto es importante. Cmo se previene? Para evitar que se produzca nuevamente un esguince de cuello: Adopte una buena postura. Modifique su estacin de trabajo de modo que contribuya a esto. Haga ejercicios de forma regular como se lo haya indicado el mdico o el fisioterapeuta. Evite realizar [employer] o que podran causar un esguince de  cuello. Comunquese con un mdico si: Sus sntomas empeoran. Sus sntomas no mejoran despus de 2 semanas de tratamiento. El dolor [place]. Los medicamentos no Tourist information centre manager. Tiene sntomas nuevos que no puede explicar. El Financial risk analyst en la piel o le causa molestias en la piel. Solicite ayuda de inmediato si: Nurse, adult. Le aparece alguno de los siguientes en cualquier parte del cuerpo: Prdida de la sensibilidad. Hormigueo. Debilidad. No puede mover una parte del cuerpo. Tiene dolor en el cuello y alguno de estos sntomas: Mareo muy intenso. Dolor de cabeza muy intenso. Resumen Al esguince cervical tambin se le denomina "esguince de cuello". Es Neomia Dear distensin o un desgarro en uno o ms ligamentos del cuello. Los ligamentos son tejidos que conectan a los [employer]. Los esguinces de cuello pueden deberse a un traumatismo, tal como una lesin o una cada. Podr tener sntomas inmediatamente despus de la lesin o estos podrn aparecer con el paso de [employer]. Los esguinces de cuello pueden tratarse con reposo, calor, hielo, medicamentos, ejercicios y [place]. Esta informacin no tiene Theme park manager el consejo del mdico. Asegrese de hacerle al mdico cualquier pregunta que tenga. Document Revised: 06/13/2019 Document Reviewed: 06/13/2019 Elsevier Patient Education  2023 [employer].

## 2022-02-24 ENCOUNTER — Ambulatory Visit: Payer: Medicare PPO

## 2022-02-24 ENCOUNTER — Ambulatory Visit (INDEPENDENT_AMBULATORY_CARE_PROVIDER_SITE_OTHER): Payer: Medicare PPO

## 2022-02-24 VITALS — BP 120/60 | HR 64 | Temp 98.5°F | Ht 59.0 in | Wt 117.4 lb

## 2022-02-24 DIAGNOSIS — Z0000 Encounter for general adult medical examination without abnormal findings: Secondary | ICD-10-CM | POA: Diagnosis not present

## 2022-02-24 NOTE — Patient Instructions (Addendum)
Eileen Hansen , Thank you for taking time to come for your Medicare Wellness Visit. I appreciate your ongoing commitment to your health goals. Please review the following plan we discussed and let me know if I can assist you in the future.   These are the goals we discussed:  Goals       Patient Stated      I would like to be without knee pain       Weight (lb) < 115 lb (52.2 kg)      Patient would like to lose 5 lbs       Would like to travel to [place]. (pt-stated)        This is a list of the screening recommended for you and due dates:  Health Maintenance  Topic Date Due   COVID-19 Vaccine (3 - Pfizer series) 03/04/2022*   Flu Shot  03/26/2022*   Tetanus Vaccine  07/23/2022*   Zoster (Shingles) Vaccine (1 of 2) 07/23/2022*   Pneumonia Vaccine  Completed   DEXA scan (bone density measurement)  Completed   Hepatitis C Screening: USPSTF Recommendation to screen - Ages 78-79 yo.  Completed   HPV Vaccine  Aged Out  *Topic was postponed. The date shown is not the original due date.   Advanced directives: Yes  Conditions/risks identified: None  Next appointment: Follow up in one year for your annual wellness visit     Preventive Care 65 Years and Older, Female Preventive care refers to lifestyle choices and visits with your health care provider that can promote health and wellness. What does preventive care include? A yearly physical exam. This is also called an annual well check. Dental exams once or twice a year. Routine eye exams. Ask your health care provider how often you should have your eyes checked. Personal lifestyle choices, including: Daily care of your teeth and gums. Regular physical activity. Eating a healthy diet. Avoiding tobacco and drug use. Limiting alcohol use. Practicing safe sex. Taking low-dose aspirin every day. Taking vitamin and mineral supplements as recommended by your health care provider. What happens during an annual well check? The  services and screenings done by your health care provider during your annual well check will depend on your age, overall health, lifestyle risk factors, and family history of disease. Counseling  Your health care provider may ask you questions about your: Alcohol use. Tobacco use. Drug use. Emotional well-being. Home and relationship well-being. Sexual activity. Eating habits. History of falls. Memory and ability to understand (cognition). Work and work Astronomer. Reproductive health. Screening  You may have the following tests or measurements: Height, weight, and BMI. Blood pressure. Lipid and cholesterol levels. These may be checked every 5 years, or more frequently if you are over 41 years old. Skin check. Lung cancer screening. You may have this screening every year starting at age 16 if you have a 30-pack-year history of smoking and currently smoke or have quit within the past 15 years. Fecal occult blood test (FOBT) of the stool. You may have this test every year starting at age 44. Flexible sigmoidoscopy or colonoscopy. You may have a sigmoidoscopy every 5 years or a colonoscopy every 10 years starting at age 63. Hepatitis C blood test. Hepatitis B blood test. Sexually transmitted disease (STD) testing. Diabetes screening. This is done by checking your blood sugar (glucose) after you have not eaten for a while (fasting). You may have this done every 1-3 years. Bone density scan. This is done to screen  for osteoporosis. You may have this done starting at age 68. Mammogram. This may be done every 1-2 years. Talk to your health care provider about how often you should have regular mammograms. Talk with your health care provider about your test results, treatment options, and if necessary, the need for more tests. Vaccines  Your health care provider may recommend certain vaccines, such as: Influenza vaccine. This is recommended every year. Tetanus, diphtheria, and acellular  pertussis (Tdap, Td) vaccine. You may need a Td booster every 10 years. Zoster vaccine. You may need this after age 29. Pneumococcal 13-valent conjugate (PCV13) vaccine. One dose is recommended after age 59. Pneumococcal polysaccharide (PPSV23) vaccine. One dose is recommended after age 64. Talk to your health care provider about which screenings and vaccines you need and how often you need them. This information is not intended to replace advice given to you by your health care provider. Make sure you discuss any questions you have with your health care provider. Document Released: 08/01/2015 Document Revised: 03/24/2016 Document Reviewed: 05/06/2015 Elsevier Interactive Patient Education  2017 [employer].  Fall Prevention in the Home Falls can cause injuries. They can happen to people of all ages. There are many things you can do to make your home safe and to help prevent falls. What can I do on the outside of my home? Regularly fix the edges of walkways and driveways and fix any cracks. Remove anything that might make you trip as you walk through a door, such as a raised step or threshold. Trim any bushes or trees on the path to your home. Use bright outdoor lighting. Clear any walking paths of anything that might make someone trip, such as rocks or tools. Regularly check to see if handrails are loose or broken. Make sure that both sides of any steps have handrails. Any raised decks and porches should have guardrails on the edges. Have any leaves, snow, or ice cleared regularly. Use sand or salt on walking paths during winter. Clean up any spills in your garage right away. This includes oil or grease spills. What can I do in the bathroom? Use night lights. Install grab bars by the toilet and in the tub and shower. Do not use towel bars as grab bars. Use non-skid mats or decals in the tub or shower. If you need to sit down in the shower, use a plastic, non-slip stool. Keep the floor  dry. Clean up any water that spills on the floor as soon as it happens. Remove soap buildup in the tub or shower regularly. Attach bath mats securely with double-sided non-slip rug tape. Do not have throw rugs and other things on the floor that can make you trip. What can I do in the bedroom? Use night lights. Make sure that you have a light by your bed that is easy to reach. Do not use any sheets or blankets that are too big for your bed. They should not hang down onto the floor. Have a firm chair that has side arms. You can use this for support while you get dressed. Do not have throw rugs and other things on the floor that can make you trip. What can I do in the kitchen? Clean up any spills right away. Avoid walking on wet floors. Keep items that you use a lot in easy-to-reach places. If you need to reach something above you, use a strong step stool that has a grab bar. Keep electrical cords out of the way. Do  not use floor polish or wax that makes floors slippery. If you must use wax, use non-skid floor wax. Do not have throw rugs and other things on the floor that can make you trip. What can I do with my stairs? Do not leave any items on the stairs. Make sure that there are handrails on both sides of the stairs and use them. Fix handrails that are broken or loose. Make sure that handrails are as long as the stairways. Check any carpeting to make sure that it is firmly attached to the stairs. Fix any carpet that is loose or worn. Avoid having throw rugs at the top or bottom of the stairs. If you do have throw rugs, attach them to the floor with carpet tape. Make sure that you have a light switch at the top of the stairs and the bottom of the stairs. If you do not have them, ask someone to add them for you. What else can I do to help prevent falls? Wear shoes that: Do not have high heels. Have rubber bottoms. Are comfortable and fit you well. Are closed at the toe. Do not wear  sandals. If you use a stepladder: Make sure that it is fully opened. Do not climb a closed stepladder. Make sure that both sides of the stepladder are locked into place. Ask someone to hold it for you, if possible. Clearly mark and make sure that you can see: Any grab bars or handrails. First and last steps. Where the edge of each step is. Use tools that help you move around (mobility aids) if they are needed. These include: Canes. Walkers. Scooters. Crutches. Turn on the lights when you go into a dark area. Replace any light bulbs as soon as they burn out. Set up your furniture so you have a clear path. Avoid moving your furniture around. If any of your floors are uneven, fix them. If there are any pets around you, be aware of where they are. Review your medicines with your doctor. Some medicines can make you feel dizzy. This can increase your chance of falling. Ask your doctor what other things that you can do to help prevent falls. This information is not intended to replace advice given to you by your health care provider. Make sure you discuss any questions you have with your health care provider. Document Released: 05/01/2009 Document Revised: 12/11/2015 Document Reviewed: 08/09/2014 Elsevier Interactive Patient Education  2017 [employer].

## 2022-02-24 NOTE — Progress Notes (Signed)
Medicare  Subjective:   Eileen Hansen is a 76 y.o. female who presents for Medicare Annual (Subsequent) preventive examination.  Review of Systems      Cardiac Risk Factors include: advanced age (>52men, >43 women);hypertension     Objective:    Today's Vitals   02/24/22 1408  BP: 120/60  Pulse: 64  Temp: 98.5 F (36.9 C)  TempSrc: Oral  SpO2: 96%  Weight: 117 lb 6.4 oz (53.3 kg)  Height: 4\' 11"  (1.499 m)   Body mass index is 23.71 kg/m.     02/24/2022    2:22 PM 09/14/2021   12:48 PM 02/13/2021    1:08 PM 02/13/2020    2:14 PM 11/07/2019    1:56 PM 10/17/2019    1:16 AM 10/16/2019    7:32 PM  Advanced Directives  Does Patient Have a Medical Advance Directive? Yes Yes Yes Yes No No No  Type of 10/18/2019 of [place];Living will Healthcare Power of [place];Living will Healthcare Power of [place];Living will Healthcare Power of [place];Living will     Does patient want to make changes to medical advance directive? No - Patient declined No - Patient declined  No - Patient declined     Copy of Healthcare Power of Attorney in Chart? No - copy requested  No - copy requested No - copy requested     Would patient like information on creating a medical advance directive?     No - Patient declined      Current Medications (verified) Outpatient Encounter Medications as of 02/24/2022  Medication Sig   aspirin EC 81 MG tablet Take 81 mg by mouth daily. Swallow whole.   carvedilol (COREG) 3.125 MG tablet Take 1 tablet (3.125 mg total) by mouth 2 (two) times daily.   Cholecalciferol (VITAMIN D3) 25 MCG (1000 UT) CAPS Take 1 capsule by mouth daily.   COLLAGEN PO Take by mouth daily.   hydrochlorothiazide (HYDRODIURIL) 25 MG tablet TAKE ONE TABLET BY MOUTH ONE TIME DAILY   lisinopril (ZESTRIL) 10 MG tablet TAKE TWO TABLETS BY MOUTH DAILY   Magnesium 200 MG TABS Take 1 tablet (200 mg total) by mouth at bedtime. (Patient taking differently: Take 240 mg by mouth at  bedtime. Magnesium glyconate takes 240 mg 2 tabs at hs)   omega-3 acid ethyl esters (LOVAZA) 1 g capsule Take 1 capsule (1 g total) by mouth 2 (two) times daily.   rosuvastatin (CRESTOR) 20 MG tablet Take 1 tablet (20 mg total) by mouth daily.   vitamin B-12 (CYANOCOBALAMIN) 1000 MCG tablet Take 1,000 mcg by mouth daily.   No facility-administered encounter medications on file as of 02/24/2022.    Allergies (verified) Fenofibrate and Penicillins   History: Past Medical History:  Diagnosis Date   Allergic rhinitis    Arthralgia 09/30/2015   Arthritis    fingers   Bruises easily and skin thin 09/11/2021   Chronic  bilateral shoulder pain left shoulder limited rom 12/07/2016   Heart murmur 12/07/2016   HTN (hypertension)    Lexiscan Myoview 04/2021    Myoview 10/22: EF 78, no ischemia or infarction; low risk   mild Aortic valve insufficiency    Mild Mitral valve regurgitation    PAD (peripheral artery disease) (HCC)    Pain in the wrist, unspecified laterality 03/03/2014   Personal history of COVID-19 04/2021   mild symptoms x 1 week all symptoms resolved   Prolapse of anterior vaginal wall    Tachycardia  Episodic   Upper back pain    Wears glasses for reading    Wears partial denture upper    Past Surgical History:  Procedure Laterality Date   ANTERIOR AND POSTERIOR REPAIR WITH SACROSPINOUS FIXATION N/A 09/14/2021   Procedure: ANTERIOR REPAIR WITH SACROSPINOUS FIXATION;  Surgeon: Marguerita BeardsSchroeder, Michelle N, MD;  [facility] [place];  Service: Gynecology;  Laterality: N/A;   APPENDECTOMY  76 yrs old   BUNIONECTOMY Bilateral    more than 40 yrs ago   colonscopy  2018   CYSTOSCOPY N/A 09/14/2021   Procedure: CYSTOSCOPY;  Surgeon: Marguerita BeardsSchroeder, Michelle N, MD;  [facility] [place];  Service: Gynecology;  Laterality: N/A;   WISDOM TOOTH EXTRACTION  76 yrs old   Family History  Problem Relation Age of Onset   Cancer Father        melanoma    Diabetes Mother    Hypertension Mother    Hyperlipidemia Mother    Stroke Mother    Heart attack Mother    Heart attack Brother    Cancer Brother        pancreatic   Colon cancer Neg Hx    Breast cancer Neg Hx    Coronary artery disease Neg Hx    Social History   Socioeconomic History   Marital status: Married    Spouse name: Not on file   Number of children: 1   Years of education: Not on file   Highest education level: Not on file  Occupational History   Occupation: DentistHomemaker    Employer: RETIRED  Tobacco Use   Smoking status: Never   Smokeless tobacco: Never  Vaping Use   Vaping Use: Never used  Substance and Sexual Activity   Alcohol use: Yes    Comment:  occ   Drug use: Never   Sexual activity: Not Currently    Comment: lives with husband  Other Topics Concern   Not on file  Social History Narrative   University - [place], Masters Degree - counseling, [employer]   Married '69 - 7 years, divorced; married '82   21 daughter - '72; 2 grandchildren      Regular Exercise -  YES         Social Determinants of Health   Financial Resource Strain: Low Risk  (02/24/2022)   Overall Financial Resource Strain (CARDIA)    Difficulty of Paying Living Expenses: Not hard at all  Food Insecurity: No Food Insecurity (02/24/2022)   Hunger Vital Sign    Worried About Running Out of Food in the Last Year: Never true    Ran Out of Food in the Last Year: Never true  Transportation Needs: No Transportation Needs (02/24/2022)   PRAPARE - Administrator, Civil ServiceTransportation    Lack of Transportation (Medical): No    Lack of Transportation (Non-Medical): No  Physical Activity: Inactive (02/24/2022)   Exercise Vital Sign    Days of Exercise per Week: 0 days    Minutes of Exercise per Session: 0 min  Stress: No Stress Concern Present (02/24/2022)   [employer] - Occupational Stress Questionnaire    Feeling of Stress : Not at all  Social Connections: Moderately  Isolated (02/24/2022)   Social Connection and Isolation Panel [NHANES]    Frequency of Communication with Friends and Family: More than three times a week    Frequency of Social Gatherings with Friends and Family: More than three times a week    Attends Religious Services:  Never    Active Member of Clubs or Organizations: No    Attends Banker Meetings: Never    Marital Status: Married    Tobacco Counseling Counseling given: Not Answered   Clinical Intake:  Pre-visit preparation completed: No  Pain : No/denies pain     BMI - recorded: 23.32 Nutritional Status: BMI of 19-24  Normal Nutritional Risks: None Diabetes: No  How often do you need to have someone help you when you read instructions, pamphlets, or other written materials from your doctor or pharmacy?: 1 - Never  Diabetic?  No  Interpreter Needed?: No  Information entered by :: Theresa Mulligan LPN   Activities of Daily Living    02/24/2022    2:19 PM 09/14/2021   12:55 PM  In your present state of health, do you have any difficulty performing the following activities:  Hearing? 0 0  Vision? 0 0  Difficulty concentrating or making decisions? 0 0  Walking or climbing stairs? 0 0  Dressing or bathing? 0 0  Doing errands, shopping? 0   Preparing Food and eating ? N   Using the Toilet? N   In the past six months, have you accidently leaked urine? N   Do you have problems with loss of bowel control? N   Managing your Medications? N   Managing your Finances? N   Housekeeping or managing your Housekeeping? N     Patient Care Team: [name], [name], MD as PCP - General (Family Medicine) Meriam Sprague, MD as PCP - Cardiology (Cardiology) Carrington Clamp, MD as Consulting Physician (Obstetrics and Gynecology) Rachael Fee, MD as Attending Physician (Gastroenterology) Carman Ching, OD (Optometry)  Indicate any recent Medical Services you may have received from other than [facility] providers  in the past year (date may be approximate).     Assessment:   This is a routine wellness examination for Eileen Hansen.  Hearing/Vision screen Hearing Screening - Comments:: No hearing difficulty Vision Screening - Comments:: Wears glasses. Followed by Dr Hyacinth Meeker  Dietary issues and exercise activities discussed: Exercise limited by: None identified   Goals Addressed               This Visit's Progress     Would like to travel to [place]. (pt-stated)         Depression Screen    02/24/2022    2:17 PM 02/16/2022    4:10 PM 02/16/2022    3:05 PM 11/06/2021   12:29 PM 08/21/2021    2:37 PM 02/13/2021    1:09 PM 02/13/2021    1:07 PM  PHQ 2/9 Scores  PHQ - 2 Score 0 0 0 2 0 0 0  PHQ- 9 Score 0 2  4       Fall Risk    02/24/2022    2:20 PM 02/16/2022    4:11 PM 02/16/2022    3:05 PM 08/21/2021    2:37 PM 02/13/2021    1:09 PM  Fall Risk   Falls in the past year? 0 0 0 0 0  Number falls in past yr: 0 0 0 0 0  Injury with Fall? 0 0 0 0 0  Risk for fall due to : No Fall Risks No Fall Risks No Fall Risks No Fall Risks   Follow up  Falls evaluation completed Falls evaluation completed Falls evaluation completed Falls evaluation completed    FALL RISK PREVENTION PERTAINING TO THE HOME:  Any stairs in or around the  home? Yes  If so, are there any without handrails? No  Home free of loose throw rugs in walkways, pet beds, electrical cords, etc? Yes  Adequate lighting in your home to reduce risk of falls? Yes   ASSISTIVE DEVICES UTILIZED TO PREVENT FALLS:  Life alert? No  Use of a cane, walker or w/c? No  Grab bars in the bathroom? No  Shower chair or bench in shower? No  Elevated toilet seat or a handicapped toilet? Yes   TIMED UP AND GO:  Was the test performed? Yes .  Length of time to ambulate 10 feet: 10 sec.   Gait steady and fast without use of assistive device  Cognitive Function:    09/02/2016    3:33 PM  MMSE - Mini Mental State Exam  Orientation to time 5  Orientation  to Place 5  Registration 3  Attention/ Calculation 5  Recall 3  Language- name 2 objects 2  Language- repeat 1  Language- follow 3 step command 3  Language- read & follow direction 1  Write a sentence 1  Copy design 1  Total score 30        02/24/2022    2:24 PM 02/13/2020    2:20 PM  6CIT Screen  What Year? 0 points 0 points  What month? 0 points 0 points  What time? 0 points 0 points  Count back from 20 0 points 0 points  Months in reverse 0 points 0 points  Repeat phrase 0 points 0 points  Total Score 0 points 0 points    Immunizations Immunization History  Administered Date(s) Administered   Fluad Quad(high Dose 65+) 05/07/2019   Influenza Split 05/11/2012   Influenza Whole 05/31/2007, 05/14/2008, 05/29/2010   Influenza, High Dose Seasonal PF 04/08/2016, 04/11/2017   Influenza,inj,Quad PF,6+ Mos 04/19/2013, 04/05/2014, 04/14/2015   Influenza-Unspecified 05/19/2018   PFIZER(Purple Top)SARS-COV-2 Vaccination 08/07/2019, 08/27/2019   Pneumococcal Conjugate-13 02/26/2014   Pneumococcal Polysaccharide-23 03/29/2011   Tdap 03/29/2011        Pneumococcal vaccine status: Up to date    Qualifies for Shingles Vaccine? Yes   Zostavax completed No   Shingrix Completed?: No.    Education has been provided regarding the importance of this vaccine. Patient has been advised to call insurance company to determine out of pocket expense if they have not yet received this vaccine. Advised may also receive vaccine at local pharmacy or Health Dept. Verbalized acceptance and understanding.  Screening Tests Health Maintenance  Topic Date Due   COVID-19 Vaccine (3 - Pfizer series) 03/04/2022 (Originally 10/22/2019)   INFLUENZA VACCINE  03/26/2022 (Originally 02/16/2022)   TETANUS/TDAP  07/23/2022 (Originally 03/28/2021)   Zoster Vaccines- Shingrix (1 of 2) 07/23/2022 (Originally 03/07/1994)   Pneumonia Vaccine 24+ Years old  Completed   DEXA SCAN  Completed   Hepatitis C Screening   Completed   HPV VACCINES  Aged Out    Health Maintenance  There are no preventive care reminders to display for this patient.  Colorectal cancer screening: No longer required.   Mammogram status: No longer required due to Age.  Bone Density status: Completed 05/05/20. Results reflect: Bone density results: OSTEOPOROSIS. Repeat every   years.  Lung Cancer Screening: (Low Dose CT Chest recommended if Age 1-80 years, 30 pack-year currently smoking OR have quit w/in 15years.) does not qualify.   Additional Screening:  Hepatitis C Screening: does qualify; Completed 09/23/15  Vision Screening: Recommended annual ophthalmology exams for early detection of glaucoma and other disorders of  the eye. Is the patient up to date with their annual eye exam?  Yes  Who is the provider or what is the name of the office in which the patient attends annual eye exams? Dr Hyacinth Meeker If pt is not established with a provider, would they like to be referred to a provider to establish care? No .   Dental Screening: Recommended annual dental exams for proper oral hygiene  Community Resource Referral / Chronic Care Management:  CRR required this visit?  No   CCM required this visit?  No      Plan:     I have personally reviewed and noted the following in the patient's chart:   Medical and social history Use of alcohol, tobacco or illicit drugs  Current medications and supplements including opioid prescriptions.  Functional ability and status Nutritional status Physical activity Advanced directives List of other physicians Hospitalizations, surgeries, and ER visits in previous 12 months Vitals Screenings to include cognitive, depression, and falls Referrals and appointments  In addition, I have reviewed and discussed with patient certain preventive protocols, quality metrics, and best practice recommendations. A written personalized care plan for preventive services as well as general preventive  health recommendations were provided to patient.     Tillie Rung, LPN   07/20/9415   Nurse Notes: None

## 2022-03-03 ENCOUNTER — Encounter: Payer: Self-pay | Admitting: Family Medicine

## 2022-03-03 ENCOUNTER — Telehealth (INDEPENDENT_AMBULATORY_CARE_PROVIDER_SITE_OTHER): Payer: Medicare PPO | Admitting: Family Medicine

## 2022-03-03 VITALS — Ht 59.0 in | Wt 112.0 lb

## 2022-03-03 DIAGNOSIS — H00015 Hordeolum externum left lower eyelid: Secondary | ICD-10-CM

## 2022-03-03 MED ORDER — POLYMYXIN B-TRIMETHOPRIM 10000-0.1 UNIT/ML-% OP SOLN: 2.0000 [drp] | OPHTHALMIC | 0 refills | Status: DC

## 2022-03-03 NOTE — Progress Notes (Signed)
Patient ID: Eileen Hansen, female   DOB: 04/24/1946, 76 y.o.   MRN: [id number]   Virtual Visit via Video Note  I connected with Eileen Hansen on 03/03/22 at  2:45 PM EDT by a video enabled telemedicine application and verified that I am speaking with the correct person using two identifiers.  Location patient: home Location provider:work or home office Persons participating in the virtual visit: patient, provider  I discussed the limitations of evaluation and management by telemedicine and the availability of in person appointments. The patient expressed understanding and agreed to proceed.   HPI:  Eileen Hansen is seen with few day history of irritation left lower lid.  She has small swollen area that is slightly tender to palpation.  She has not had any significant drainage.  No visual changes.  No prior history of stye.  She has not tried any warm compresses yet.  No involvement of the right eye.  ROS: See pertinent positives and negatives per HPI.  Past Medical History:  Diagnosis Date   Allergic rhinitis    Arthralgia 09/30/2015   Arthritis    fingers   Bruises easily and skin thin 09/11/2021   Chronic  bilateral shoulder pain left shoulder limited rom 12/07/2016   Heart murmur 12/07/2016   HTN (hypertension)    Lexiscan Myoview 04/2021    Myoview 10/22: EF 78, no ischemia or infarction; low risk   mild Aortic valve insufficiency    Mild Mitral valve regurgitation    PAD (peripheral artery disease) (HCC)    Pain in the wrist, unspecified laterality 03/03/2014   Personal history of COVID-19 04/2021   mild symptoms x 1 week all symptoms resolved   Prolapse of anterior vaginal wall    Tachycardia    Episodic   Upper back pain    Wears glasses for reading    Wears partial denture upper     Past Surgical History:  Procedure Laterality Date   ANTERIOR AND POSTERIOR REPAIR WITH SACROSPINOUS FIXATION N/A 09/14/2021   Procedure: ANTERIOR REPAIR WITH SACROSPINOUS FIXATION;  Surgeon:  [name], MD;  Location: [facility] [place];  Service: Gynecology;  Laterality: N/A;   APPENDECTOMY  76 yrs old   BUNIONECTOMY Bilateral    more than 40 yrs ago   colonscopy  2018   CYSTOSCOPY N/A 09/14/2021   Procedure: CYSTOSCOPY;  Surgeon: [name], MD;  [facility];  Service: Gynecology;  Laterality: N/A;   WISDOM TOOTH EXTRACTION  76 yrs old    Family History  Problem Relation Age of Onset   Cancer Father        melanoma   Diabetes Mother    Hypertension Mother    Hyperlipidemia Mother    Stroke Mother    Heart attack Mother    Heart attack Brother    Cancer Brother        pancreatic   Colon cancer Neg Hx    Breast cancer Neg Hx    Coronary artery disease Neg Hx     SOCIAL HX: Non-smoker   Current Outpatient Medications:    aspirin EC 81 MG tablet, Take 81 mg by mouth daily. Swallow whole., Disp: , Rfl:    carvedilol (COREG) 3.125 MG tablet, Take 1 tablet (3.125 mg total) by mouth 2 (two) times daily., Disp: 180 tablet, Rfl: 3   Cholecalciferol (VITAMIN D3) 25 MCG (1000 UT) CAPS, Take 1 capsule by mouth daily., Disp: , Rfl:    COLLAGEN PO,  Take by mouth daily., Disp: , Rfl:    hydrochlorothiazide (HYDRODIURIL) 25 MG tablet, TAKE ONE TABLET BY MOUTH ONE TIME DAILY, Disp: 90 tablet, Rfl: 2   lisinopril (ZESTRIL) 10 MG tablet, TAKE TWO TABLETS BY MOUTH DAILY, Disp: 180 tablet, Rfl: 2   Magnesium 200 MG TABS, Take 1 tablet (200 mg total) by mouth at bedtime. (Patient taking differently: Take 240 mg by mouth at bedtime. Magnesium glyconate takes 240 mg 2 tabs at hs), Disp: 30 tablet, Rfl: 11   omega-3 acid ethyl esters (LOVAZA) 1 g capsule, Take 1 capsule (1 g total) by mouth 2 (two) times daily., Disp: 180 capsule, Rfl: 3   rosuvastatin (CRESTOR) 20 MG tablet, Take 1 tablet (20 mg total) by mouth daily., Disp: 90 tablet, Rfl: 3   vitamin B-12 (CYANOCOBALAMIN) 1000 MCG tablet, Take 1,000 mcg by mouth daily., Disp: ,  Rfl:   EXAM:  VITALS per patient if applicable:  GENERAL: alert, oriented, appears well and in no acute distress  HEENT: atraumatic, conjunttiva clear, no obvious abnormalities on inspection of external nose and ears  NECK: normal movements of the head and neck  LUNGS: on inspection no signs of respiratory distress, breathing rate appears normal, no obvious gross SOB, gasping or wheezing  CV: no obvious cyanosis  MS: moves all visible extremities without noticeable abnormality  PSYCH/NEURO: pleasant and cooperative, no obvious depression or anxiety, speech and thought processing grossly intact  ASSESSMENT AND PLAN:  Discussed the following assessment and plan:  Small stye left lower lid.  This was visible on the video. -Stressed importance of warm moist compresses several times daily -Send in Polytrim ophthalmic drops every 4 hours while awake -Follow-up in office within the next couple weeks if not resolving     I discussed the assessment and treatment plan with the patient. The patient was provided an opportunity to ask questions and all were answered. The patient agreed with the plan and demonstrated an understanding of the instructions.   The patient was advised to call back or seek an in-person evaluation if the symptoms worsen or if the condition fails to improve as anticipated.     Evelena Peat, MD

## 2022-03-11 NOTE — Progress Notes (Deleted)
Cardiology Office Note:    Date:  03/11/2022   ID:  Eileen Hansen, DOB October 30, 1945, MRN [id number]  PCP:  [name], [name], MD  [facility] Cardiologist:  Meriam Sprague, MD  [facility] Electrophysiologist:  None   Referring MD: [name], [name], MD   Patient Profile:    Eileen Hansen is a 77 y.o. female with history of essential hypertension, HLD, prediabetes and osteoarthritis who returns to clinic for follow-up  History of Present Illness:    Eileen Hansen  Is a 76 year old female with history of essential HTN, HLD, prediabetes, and OA who returns to clinic for follow-up.  Was seen on 04/09/20 where she was doing well from a CV standpoint. No anginal symptoms. TTE 03/2021 for follow-up of AR and MR showed LVEF 60-65%, G1DD, trivial MR aortic sclerosis.   Was last seen by Dr. Kirke Corin on 12/01/21 due to claudication. Lower extremity arterial Doppler showed an ABI of 0.81 on the right and 0.91 on the left.  Duplex showed significant stenosis in the right mid SFA and moderate disease affecting the left SFA. Her symptoms were improved at that time and she was recommended for continued medical management.   Today, ***   Past Medical History:  Diagnosis Date   Allergic rhinitis    Arthralgia 09/30/2015   Arthritis    fingers   Bruises easily and skin thin 09/11/2021   Chronic  bilateral shoulder pain left shoulder limited rom 12/07/2016   Heart murmur 12/07/2016   HTN (hypertension)    Lexiscan Myoview 04/2021    Myoview 10/22: EF 78, no ischemia or infarction; low risk   mild Aortic valve insufficiency    Mild Mitral valve regurgitation    PAD (peripheral artery disease) (HCC)    Pain in the wrist, unspecified laterality 03/03/2014   Personal history of COVID-19 04/2021   mild symptoms x 1 week all symptoms resolved   Prolapse of anterior vaginal wall    Tachycardia    Episodic   Upper back pain    Wears glasses for reading    Wears partial denture upper     Current  Medications: No outpatient medications have been marked as taking for the 03/12/22 encounter (Appointment) with Meriam Sprague, MD.     Allergies:   Fenofibrate and Penicillins   Social History   Tobacco Use   Smoking status: Never   Smokeless tobacco: Never  Vaping Use   Vaping Use: Never used  Substance Use Topics   Alcohol use: Yes    Comment:  occ   Drug use: Never     Family Hx: The patient's family history includes Cancer in her brother and father; Diabetes in her mother; Heart attack in her brother and mother; Hyperlipidemia in her mother; Hypertension in her mother; Stroke in her mother. There is no history of Colon cancer, Breast cancer, or Coronary artery disease.  Review of Systems  Constitutional:  Negative for chills and fever.  HENT:  Negative for congestion and sinus pain.   Eyes:  Negative for blurred vision and pain.  Respiratory:  Positive for shortness of breath. Negative for cough.   Cardiovascular:  Positive for chest pain and palpitations. Negative for orthopnea, claudication, leg swelling and PND.  Gastrointestinal:  Negative for blood in stool, heartburn and melena.  Genitourinary:  Negative for frequency and urgency.  Musculoskeletal:  Positive for joint pain. Negative for myalgias.  Skin:  Negative for itching and rash.  Neurological:  Negative for dizziness and headaches.  Endo/Heme/Allergies:  Bruises/bleeds easily.  Psychiatric/Behavioral:  The patient is not nervous/anxious.      EKGs/Labs/Other Test Reviewed:    EKG:  EKG was not ordered today 04/09/20: NSR with HR 87.   Prior CV studies: TTE 11/2016:  Study Conclusions  - Left ventricle: The cavity size was normal. Wall thickness was    increased in a pattern of mild LVH. Systolic function was normal.    The estimated ejection fraction was in the range of 60% to 65%.    Wall motion was normal; there were no regional wall motion    abnormalities.  - Aortic valve: There was mild  regurgitation.  - Mitral valve: There was mild regurgitation.  - Left atrium: The atrium was mildly dilated.  - Atrial septum: No defect or patent foramen ovale was identified.   Carotid artery ultrasound 11/07/2016: IMPRESSION: Minimal plaque in the carotid arteries. No significant carotid artery stenosis. Patent vertebral arteries.  RLE doppler ultrasound 10/07/19: No DVT  Recent Labs: 07/30/2021: ALT 11 08/25/2021: TSH 2.28 11/06/2021: BUN 21; Creatinine, Ser 0.65; Hemoglobin 13.7; Platelets 275.0; Potassium 3.8; Sodium 138   Recent Lipid Panel Lab Results  Component Value Date/Time   CHOL 103 07/30/2021 10:06 AM   TRIG 84 07/30/2021 10:06 AM   HDL 32 (L) 07/30/2021 10:06 AM   CHOLHDL 3.2 07/30/2021 10:06 AM   CHOLHDL 4 02/18/2021 10:05 AM   LDLCALC 54 07/30/2021 10:06 AM   LDLCALC 64 03/26/2020 12:59 PM   LDLDIRECT 152.0 06/25/2019 03:36 PM    Physical Exam:    VS:  There were no vitals taken for this visit.    Wt Readings from Last 3 Encounters:  03/03/22 112 lb (50.8 kg)  02/24/22 117 lb 6.4 oz (53.3 kg)  02/16/22 115 lb 8 oz (52.4 kg)   GEN:  Well nourished, well developed in no acute distress HEENT: Normal NECK: No JVD; No carotid bruits CARDIAC: RRR,  2/6 systolic murmur. No rubs or gallops RESPIRATORY:  Clear to auscultation without rales, wheezing or rhonchi  ABDOMEN: Soft, non-tender, non-distended MUSCULOSKELETAL:  No edema; No deformity  SKIN: Warm and dry NEUROLOGIC:  Alert and oriented x 3 PSYCHIATRIC:  Normal affect   ASSESSMENT & PLAN:    No diagnosis found.   #PAD: -Stable symptoms and not lifestyle limiting -ABI 0.81 on right and 0.91 on left -LE arterial dopplers with significant stenosis in the right SFA and moderate disease in left SFA -Recommended for continued exercise and only revascularize if failing medical therapy -Follow-up with Dr. Bing Neighbors as scheduled   #Atypical Chest Pain: -Myoview 04/2021 without ischemia/infarction -TTE  with normal EF, grade I DD, trivial MR, trivial AI  #Mild Aortic Regurgitation: #Mild Mitral Regurgitation: -TTE on 03/2021 showed trivial MR and AI -Continue serial monitoring  #Essential Hypertension: -Continue amlopdipine and HCTZ -Continue coreg 3.125mg  BID   #Hyperlipidemia: -LDL 72 in 02/2021; repeat pending -Continue crestor 20mg  per PCP  #Prediabetes: -HgA1C 6.12 -Continue lifestyle modifications including diet rich in fruit, vegetables and lean proteins as well as regular exercise with goal of moderate exercise weekly -Follows regularly with PCP   Medication Adjustments/Labs and Tests Ordered: Current medicines are reviewed at length with the patient today.  Concerns regarding medicines are outlined above.  Tests Ordered: No orders of the defined types were placed in this encounter.  Medication Changes: No orders of the defined types were placed in this encounter.  I,Mykaella Javier,acting as a for Neurosurgeon  Shari Prows, MD.,have documented all relevant documentation on the behalf of Meriam Sprague, MD,as directed by  Meriam Sprague, MD while in the presence of Meriam Sprague, MD.  I, Meriam Sprague, MD, have reviewed all documentation for this visit. The documentation on 03/11/22 for the exam, diagnosis, procedures, and orders are all accurate and complete.   Signed, Meriam Sprague, MD  03/11/2022 8:41 PM    [facility] [address] [place], [place], [place]  [address] Phone: [phone]; Fax: [phone]

## 2022-03-12 ENCOUNTER — Ambulatory Visit: Payer: Medicare PPO | Admitting: Cardiology

## 2022-03-19 DIAGNOSIS — M25562 Pain in left knee: Secondary | ICD-10-CM | POA: Diagnosis not present

## 2022-04-12 ENCOUNTER — Ambulatory Visit: Payer: Medicare PPO | Attending: Physician Assistant | Admitting: Cardiology

## 2022-04-12 ENCOUNTER — Telehealth: Payer: Self-pay | Admitting: *Deleted

## 2022-04-12 ENCOUNTER — Ambulatory Visit: Payer: Medicare PPO | Attending: Cardiology

## 2022-04-12 ENCOUNTER — Encounter: Payer: Self-pay | Admitting: Cardiology

## 2022-04-12 VITALS — BP 141/82 | HR 65 | Ht 59.0 in | Wt 115.0 lb

## 2022-04-12 DIAGNOSIS — I739 Peripheral vascular disease, unspecified: Secondary | ICD-10-CM

## 2022-04-12 DIAGNOSIS — I351 Nonrheumatic aortic (valve) insufficiency: Secondary | ICD-10-CM

## 2022-04-12 DIAGNOSIS — M79604 Pain in right leg: Secondary | ICD-10-CM | POA: Diagnosis not present

## 2022-04-12 DIAGNOSIS — M79605 Pain in left leg: Secondary | ICD-10-CM

## 2022-04-12 DIAGNOSIS — I10 Essential (primary) hypertension: Secondary | ICD-10-CM

## 2022-04-12 DIAGNOSIS — I340 Nonrheumatic mitral (valve) insufficiency: Secondary | ICD-10-CM

## 2022-04-12 DIAGNOSIS — R002 Palpitations: Secondary | ICD-10-CM

## 2022-04-12 DIAGNOSIS — E7800 Pure hypercholesterolemia, unspecified: Secondary | ICD-10-CM | POA: Diagnosis not present

## 2022-04-12 DIAGNOSIS — E785 Hyperlipidemia, unspecified: Secondary | ICD-10-CM

## 2022-04-12 MED ORDER — CARVEDILOL 6.25 MG PO TABS: 6.2500 mg | ORAL_TABLET | Freq: Two times a day (BID) | ORAL | 3 refills | Status: DC

## 2022-04-12 NOTE — Patient Instructions (Signed)
Medication Instructions:   INCREASE YOUR CARVEDILOL TO 6.25 MG BY MOUTH TWICE DAILY  STOP ROSUVASTATIN (CRESTOR) FOR ONE WEEK AND MYCHART DR. Johney Frame THEREAFTER, IF YOUR SYMPTOMS IMPROVE OR NOT  *If you need a refill on your cardiac medications before your next appointment, please call your pharmacy*   Testing/Procedures:  Your physician has requested that you have a lower  extremity arterial duplex. This test is an ultrasound of the arteries in the legs or arms. It looks at arterial blood flow in the legs and arms. Allow one hour for Lower and Upper Arterial scans. There are no restrictions or special instructions  Your physician has requested that you have an ankle brachial index (ABI). During this test an ultrasound and blood pressure cuff are used to evaluate the arteries that supply the arms and legs with blood. Allow thirty minutes for this exam. There are no restrictions or special instructions.   ZIO XT- Long Term Monitor Instructions  Your physician has requested you wear a ZIO patch monitor for 7 days.  This is a single patch monitor. [employer] supplies one patch monitor per enrollment. Additional stickers are not available. Please do not apply patch if you will be having a Nuclear Stress Test,  Echocardiogram, Cardiac CT, MRI, or Chest Xray during the period you would be wearing the  monitor. The patch cannot be worn during these tests. You cannot remove and re-apply the  ZIO XT patch monitor.  Your ZIO patch monitor will be mailed 3 day USPS to your address on file. It may take 3-5 days  to receive your monitor after you have been enrolled.  Once you have received your monitor, please review the enclosed instructions. Your monitor  has already been registered assigning a specific monitor serial # to you.  Billing and Patient Assistance Program Information  We have supplied [employer] with any of your insurance information on file for billing purposes. [employer] offers a sliding  scale Patient Assistance Program for patients that do not have  insurance, or whose insurance does not completely cover the cost of the ZIO monitor.  You must apply for the Patient Assistance Program to qualify for this discounted rate.  To apply, please call [employer] at [phone], select option 4, select option 2, ask to apply for  Patient Assistance Program. Theodore Demark will ask your household income, and how many people  are in your household. They will quote your out-of-pocket cost based on that information.  [employer] will also be able to set up a 16-month, interest-free payment plan if needed.  Applying the monitor   Shave hair from upper left chest.  Hold abrader disc by orange tab. Rub abrader in 40 strokes over the upper left chest as  indicated in your monitor instructions.  Clean area with 4 enclosed alcohol pads. Let dry.  Apply patch as indicated in monitor instructions. Patch will be placed under collarbone on left  side of chest with arrow pointing upward.  Rub patch adhesive wings for 2 minutes. Remove white label marked "1". Remove the white  label marked "2". Rub patch adhesive wings for 2 additional minutes.  While looking in a mirror, press and release button in center of patch. A small green light will  flash 3-4 times. This will be your only indicator that the monitor has been turned on.  Do not shower for the first 24 hours. You may shower after the first 24 hours.  Press the button if you feel a symptom. You will hear  a small click. Record Date, Time and  Symptom in the Patient Logbook.  When you are ready to remove the patch, follow instructions on the last 2 pages of Patient  Logbook. Stick patch monitor onto the last page of Patient Logbook.  Place Patient Logbook in the blue and white box. Use locking tab on box and tape box closed  securely. The blue and white box has prepaid postage on it. Please place it in the mailbox as  soon as possible. Your physician should  have your test results approximately 7 days after the  monitor has been mailed back to [facility].  Call [facility] Customer Care at [phone] if you have questions regarding  your ZIO XT patch monitor. Call them immediately if you see an orange light blinking on your  monitor.  If your monitor falls off in less than 4 days, contact our Monitor department at [phone].  If your monitor becomes loose or falls off after 4 days call [employer] at [phone] for  suggestions on securing your monitor    Follow-Up: At [facility], you and your health needs are our priority.  As part of our continuing mission to provide you with exceptional heart care, we have created designated Provider Care Teams.  These Care Teams include your primary Cardiologist (physician) and Advanced Practice Providers (APPs -  Physician Assistants and Nurse Practitioners) who all work together to provide you with the care you need, when you need it.  We recommend signing up for the patient portal called "MyChart".  Sign up information is provided on this After Visit Summary.  MyChart is used to connect with patients for Virtual Visits (Telemedicine).  Patients are able to view lab/test results, encounter notes, upcoming appointments, etc.  Non-urgent messages can be sent to your provider as well.   To learn more about what you can do with MyChart, go to [email or web address].    Your next appointment:   6 month(s)  The format for your next appointment:   In Person  Provider:    DR. Shari Prows  Important Information About Sugar

## 2022-04-12 NOTE — Progress Notes (Signed)
Cardiology Office Note:    Date:  04/12/2022   ID:  Eileen Hansen, DOB July 24, 1945, MRN [id number]  PCP:  [name], [name], MD  [facility] Cardiologist:  Meriam Sprague, MD  [facility] Electrophysiologist:  None   Referring MD: [name], [name], MD    History of Present Illness:    Eileen Hansen is a 76 y.o. female with a history of essential HTN, HLD, prediabetes, and OA who returns to clinic for follow-up.  Patient has a history of hypertension that was diagnosed over 10 years ago. She is currently on metop tartrate 25mg  BID, lisinopril 10mg  daily and HCTZ 25mg  daily with well controlled blood pressures.  PCP noted soft systolic murmur on exam for which she was referred to Cardiology for further evaluation.  Was seen on 04/09/20 where she was complaining of RLE pain and knee pain. No anginal symptoms. TTE 03/2021 with LVEF 60-65%, G1DD, trivial MR aortic sclerosis.   She was seen 07/30/2021 where she reported average blood pressures of 130 systolic. She was concerned about episodes of nocturnal palpitations with shortness of breath. Her metoprolol was changed to coreg 3.125 mg BID.  Was last seen by Dr. 04/11/20 on 12/01/21 where she had stable claudication symptoms. Was recommended for continued medical therapy and exercise at that time.  Today, the patient states that she is struggling with PAD, as well as bilateral knee and calf pain. Her pain is bothering her severely as it will wake her up at night every 2-3 hours. Sometimes she also notices numbness in her bilateral toes, more noticeable when she is lying down at night. In the morning it is difficult for her to get up out of bed. It may take her 10-15 minutes to get up due to feeling numbness and weakness.  Of note, her symptoms will improve if she is more active. They worsen if she lies down or sits down watching TV. We discussed that this is less likely to be claudication given improvement with activity and possibly related to  neuropathy vs myalgias from crestor.  Every 2-3 days she has episodes of palpitations associated with shortness of breath. These episodes seem to be worse since her last visit.  She also complains of significant arthritis that makes it difficult for her to walk.   Generally she complains of very low energy and feeling fatigued throughout the day, but she is not sleeping well due to her muscle aches, numbness and arthritic pain.  This morning her blood pressure was 150/79 at home. In clinic today her blood pressure is 146/72 (141/82 on recheck). She is worried it is running higher and is wondering if her BP meds should be adjusted.  She denies any palpitations, chest pain, or shortness of breath. No lightheadedness, headaches, syncope, orthopnea, or PND.   Past Medical History:  Diagnosis Date   Allergic rhinitis    Arthralgia 09/30/2015   Arthritis    fingers   Bruises easily and skin thin 09/11/2021   Chronic  bilateral shoulder pain left shoulder limited rom 12/07/2016   Heart murmur 12/07/2016   HTN (hypertension)    Lexiscan Myoview 04/2021    Myoview 10/22: EF 78, no ischemia or infarction; low risk   mild Aortic valve insufficiency    Mild Mitral valve regurgitation    PAD (peripheral artery disease) (HCC)    Pain in the wrist, unspecified laterality 03/03/2014   Personal history of COVID-19 04/2021   mild symptoms x 1 week all symptoms  resolved   Prolapse of anterior vaginal wall    Tachycardia    Episodic   Upper back pain    Wears glasses for reading    Wears partial denture upper     Current Medications: Current Meds  Medication Sig   aspirin EC 81 MG tablet Take 81 mg by mouth daily. Swallow whole.   carvedilol (COREG) 6.25 MG tablet Take 1 tablet (6.25 mg total) by mouth 2 (two) times daily.   Cholecalciferol (VITAMIN D3) 25 MCG (1000 UT) CAPS Take 1 capsule by mouth daily.   hydrochlorothiazide (HYDRODIURIL) 25 MG tablet TAKE ONE TABLET BY MOUTH ONE TIME  DAILY   lisinopril (ZESTRIL) 10 MG tablet TAKE TWO TABLETS BY MOUTH DAILY   Magnesium 200 MG TABS Take 1 tablet (200 mg total) by mouth at bedtime. (Patient taking differently: Take 240 mg by mouth at bedtime. Magnesium glyconate takes 240 mg 2 tabs at hs)   omega-3 acid ethyl esters (LOVAZA) 1 g capsule Take 1 capsule (1 g total) by mouth 2 (two) times daily.   rosuvastatin (CRESTOR) 20 MG tablet Take 1 tablet (20 mg total) by mouth daily.   trimethoprim-polymyxin b (POLYTRIM) ophthalmic solution Place 2 drops into the left eye every 4 (four) hours.   vitamin B-12 (CYANOCOBALAMIN) 1000 MCG tablet Take 1,000 mcg by mouth daily.   [DISCONTINUED] carvedilol (COREG) 3.125 MG tablet Take 1 tablet (3.125 mg total) by mouth 2 (two) times daily.     Allergies:   Fenofibrate and Penicillins   Social History   Tobacco Use   Smoking status: Never   Smokeless tobacco: Never  Vaping Use   Vaping Use: Never used  Substance Use Topics   Alcohol use: Yes    Comment:  occ   Drug use: Never     Family Hx: The patient's family history includes Cancer in her brother and father; Diabetes in her mother; Heart attack in her brother and mother; Hyperlipidemia in her mother; Hypertension in her mother; Stroke in her mother. There is no history of Colon cancer, Breast cancer, or Coronary artery disease.  Review of Systems  Constitutional:  Positive for malaise/fatigue. Negative for chills and fever.  HENT:  Negative for congestion and sinus pain.   Eyes:  Negative for blurred vision and pain.  Respiratory:  Positive for shortness of breath. Negative for cough.   Cardiovascular:  Positive for palpitations. Negative for chest pain, orthopnea, claudication, leg swelling and PND.  Gastrointestinal:  Negative for blood in stool, heartburn and melena.  Genitourinary:  Negative for frequency and urgency.  Musculoskeletal:  Positive for joint pain and myalgias.  Skin:  Negative for itching and rash.   Neurological:  Positive for focal weakness. Negative for dizziness and headaches.  Endo/Heme/Allergies:  Bruises/bleeds easily.  Psychiatric/Behavioral:  The patient has insomnia. The patient is not nervous/anxious.      EKGs/Labs/Other Test Reviewed:    EKG:  EKG is personally reviewed  04/12/2022:  EKG was not ordered. 04/28/21: Tereso Newcomer PA - C) sinus bradycardia at 59 bpm 04/09/20: NSR with HR 87.   Prior CV studies: TTE 03/2021: IMPRESSIONS   1. Left ventricular ejection fraction, by estimation, is 60 to 65%. The  left ventricle has normal function. The left ventricle has no regional  wall motion abnormalities. There is mild left ventricular hypertrophy.  Left ventricular diastolic parameters  are consistent with Grade I diastolic dysfunction (impaired relaxation).   2. Right ventricular systolic function is normal. The right ventricular  size is normal. There is normal pulmonary artery systolic pressure. The  estimated right ventricular systolic pressure is 22.5 mmHg.   3. The mitral valve is normal in structure. Trivial mitral valve  regurgitation. No evidence of mitral stenosis.   4. The aortic valve is tricuspid. Aortic valve regurgitation is trivial.  Mild aortic valve sclerosis is present, with no evidence of aortic valve  stenosis.   5. The inferior vena cava is normal in size with greater than 50%  respiratory variability, suggesting right atrial pressure of 3 mmHg.   Myoview 04/2021:   The study is normal. Findings are consistent with no prior ischemia and no prior myocardial infarction. The study is low risk.   No ST deviation was noted.   LV perfusion is normal. There is no evidence of ischemia. There is no evidence of infarction.   Left ventricular function is normal. Nuclear stress EF: 78 %. The left ventricular ejection fraction is hyperdynamic (>65%). End diastolic cavity size is normal.   Prior study not available for comparison.  TTE 11/2016:  Study  Conclusions  - Left ventricle: The cavity size was normal. Wall thickness was    increased in a pattern of mild LVH. Systolic function was normal.    The estimated ejection fraction was in the range of 60% to 65%.    Wall motion was normal; there were no regional wall motion    abnormalities.  - Aortic valve: There was mild regurgitation.  - Mitral valve: There was mild regurgitation.  - Left atrium: The atrium was mildly dilated.  - Atrial septum: No defect or patent foramen ovale was identified.   Carotid artery ultrasound 11/07/2016: IMPRESSION: Minimal plaque in the carotid arteries. No significant carotid artery stenosis. Patent vertebral arteries.  RLE doppler ultrasound 10/07/19: No DVT  Recent Labs: 07/30/2021: ALT 11 08/25/2021: TSH 2.28 11/06/2021: BUN 21; Creatinine, Ser 0.65; Hemoglobin 13.7; Platelets 275.0; Potassium 3.8; Sodium 138   Recent Lipid Panel Lab Results  Component Value Date/Time   CHOL 103 07/30/2021 10:06 AM   TRIG 84 07/30/2021 10:06 AM   HDL 32 (L) 07/30/2021 10:06 AM   CHOLHDL 3.2 07/30/2021 10:06 AM   CHOLHDL 4 02/18/2021 10:05 AM   LDLCALC 54 07/30/2021 10:06 AM   LDLCALC 64 03/26/2020 12:59 PM   LDLDIRECT 152.0 06/25/2019 03:36 PM    Physical Exam:    VS:  BP (!) 141/82 (BP Location: Left Arm, Patient Position: Sitting, Cuff Size: Normal)   Pulse 65   Ht 4\' 11"  (1.499 m)   Wt 115 lb (52.2 kg)   SpO2 95%   BMI 23.23 kg/m     Wt Readings from Last 3 Encounters:  04/12/22 115 lb (52.2 kg)  03/03/22 112 lb (50.8 kg)  02/24/22 117 lb 6.4 oz (53.3 kg)   GEN:  Well nourished, well developed in no acute distress HEENT: Normal NECK: No JVD; No carotid bruits CARDIAC: RRR,  1/6 systolic murmur RESPIRATORY:  Clear to auscultation without rales, wheezing or rhonchi  ABDOMEN: Soft, non-tender, non-distended MUSCULOSKELETAL: Warm, no edema SKIN: Warm and dry NEUROLOGIC:  Alert and oriented x 3 PSYCHIATRIC:  Normal affect   ASSESSMENT &  PLAN:    Pain in both lower extremities  PAD (peripheral artery disease) (HCC) - Plan: carvedilol (COREG) 6.25 MG tablet, VAS 04/26/22 LOWER EXTREMITY ARTERIAL DUPLEX, VAS [place] ABI WITH/WO TBI  Essential hypertension - Plan: carvedilol (COREG) 6.25 MG tablet  Mild mitral regurgitation - Plan: carvedilol (COREG) 6.25 MG tablet  Hyperlipidemia, unspecified hyperlipidemia type - Plan: carvedilol (COREG) 6.25 MG tablet  Palpitations - Plan: LONG TERM MONITOR (3-14 DAYS)  Aortic valve insufficiency, etiology of cardiac valve disease unspecified  Pure hypercholesterolemia    #LE Pain/Numbness: Symptoms worse when laying down at night and when first waking up in the morning and seem to improve with ambulation. Overall, this does not sound like it is related to claudication. May be due to underlying neuropathy, but will rule out statin related myalgias. Will trial off the crestor and re-evaluate symptoms. If symptoms resolve, will change to a different statin. If symptoms persist, plan to refer to Neuro for work-up for possible LE neuropathy.  -Trial off statin and assess symptoms -If symptoms improve off statin, will change medication -If symptoms unchanged, will refer to neuro for further evaluation of suspected neuropathy -Low suspicion this is related to claudication as symptoms improve with activity  #PAD: Follows with Dr. Fletcher Anon. Discussed that her knee pain, numbness and pain in her legs at night do not sound consistent with claudication. Activity seems to improve symptoms. Agree with continued medical management of PAD and will work-up her other symptoms as above. -Follow-up with Dr. Fletcher Anon as scheduled -LE arterial dopplers with significant stenosis in the right SFA and moderate disease in left SFA; will continue surveillance imaging per patient preference -Recommended for continued exercise and only revascularize if failing medical therapy  #Suspected palpitations: Patient with episodes  where she "catches her breath" like her heart "skips a beat" Symptoms have increased in frequency since last visit occurring up to a couple of times per week. Will check zio for further monitoring. -Check zio monitor x 7days -Continue coreg 6.25mg  BID -If no improvement, can check a cardiac monitor  #Atypical Chest Pain: Resolved with no further episodes. -Myoview 04/2021 without ischemia/infarction -TTE 03/2021 with normal EF, grade I DD, trivial MR, trivial AI  #Mild Aortic Regurgitation: #Mild Mitral Regurgitation: -TTE on 03/2021 showed trivial MR and AI -Continue serial monitoring  #Essential Hypertension: Elevated to 140s. Will increase coreg at this time. -Continue amlopdipine and HCTZ -Change metop to coreg 6.25mg  BID   #Hyperlipidemia: -LDL 54 07/2021 -Continue crestor 20mg  per PCP  #Prediabetes: -HgA1C 6.0 -Follows regularly with PCP  Follow up:  6 months.  Medication Adjustments/Labs and Tests Ordered: Current medicines are reviewed at length with the patient today.  Concerns regarding medicines are outlined above.   Tests Ordered: Orders Placed This Encounter  Procedures   LONG TERM MONITOR (3-14 DAYS)   VAS [place] LOWER EXTREMITY ARTERIAL DUPLEX   VAS [place] ABI WITH/WO TBI   Medication Changes: Meds ordered this encounter  Medications   carvedilol (COREG) 6.25 MG tablet    Sig: Take 1 tablet (6.25 mg total) by mouth 2 (two) times daily.    Dispense:  180 tablet    Refill:  3    DOSE INCREASE    I,Mathew Stumpf,acting as a scribe for Freada Bergeron, MD.,have documented all relevant documentation on the behalf of Freada Bergeron, MD,as directed by  Freada Bergeron, MD while in the presence of Freada Bergeron, MD.  I, Freada Bergeron, MD, have reviewed all documentation for this visit. The documentation on 04/12/22 for the exam, diagnosis, procedures, and orders are all accurate and complete.   Signed, Freada Bergeron, MD  04/12/2022  5:15 PM    [facility] [address], [place], [place]  [address] Phone: [phone]; Fax: [phone]

## 2022-04-12 NOTE — Progress Notes (Unsigned)
Enrolled for [employer] to mail a ZIO XT long term holter monitor to the patients address on file.  

## 2022-04-12 NOTE — Telephone Encounter (Signed)
-----   Message from Jennefer Bravo sent at 04/12/2022  5:22 PM EDT ----- Regarding: RE: 7 DAY ZIO PER DR. Johney Frame Done  ----- Message ----- From: Nuala Alpha, LPN Sent: 3/88/8280   4:40 PM EDT To: Nuala Alpha, LPN; Shelly A Wells Subject: 7 DAY ZIO PER DR. Johney Frame                    Dr. Johney Frame ordered a 7 day zio for palpitations.  Please enroll and let me know when you do.  Thanks [employer]

## 2022-04-16 DIAGNOSIS — R002 Palpitations: Secondary | ICD-10-CM

## 2022-04-21 ENCOUNTER — Encounter: Payer: Self-pay | Admitting: Cardiology

## 2022-04-23 ENCOUNTER — Ambulatory Visit (HOSPITAL_COMMUNITY)
Admission: RE | Admit: 2022-04-23 | Discharge: 2022-04-23 | Disposition: A | Discharge status: Home / Self Care | Payer: Medicare PPO | Source: Ambulatory Visit | Attending: Cardiovascular Disease | Admitting: Cardiovascular Disease

## 2022-04-23 DIAGNOSIS — I739 Peripheral vascular disease, unspecified: Secondary | ICD-10-CM | POA: Diagnosis not present

## 2022-04-25 ENCOUNTER — Other Ambulatory Visit: Payer: Self-pay | Admitting: Physician Assistant

## 2022-05-03 ENCOUNTER — Telehealth: Payer: Self-pay | Admitting: *Deleted

## 2022-05-03 ENCOUNTER — Ambulatory Visit: Payer: Medicare PPO | Admitting: Cardiology

## 2022-05-03 MED ORDER — CARVEDILOL 12.5 MG PO TABS
12.5000 mg | ORAL_TABLET | Freq: Two times a day (BID) | ORAL | 2 refills | Status: DC
Start: 2022-05-03 — End: 2022-09-02

## 2022-05-03 NOTE — Telephone Encounter (Signed)
-----   Message from Freada Bergeron, MD sent at 05/02/2022  7:48 PM EDT ----- Her heart monitor looks good with only 3 runs of brief SVT (longest was 12 beats). She has rare extra beats. We do not need to adjust her medications at this time as overall the monitor looks really good. If her palpitations are very bothersome, we certainly can increase her coreg if she would like to 12.5mg  BID.

## 2022-05-03 NOTE — Telephone Encounter (Signed)
The patient has been notified of the result and verbalized understanding.  All questions (if any) were answered.  Pt states she would like for her coreg to be increased to the next dose at 12.5 mg po BID, for she reports her palpitations are bothersome at times.   Pt education provided on increasing her po water intake and limiting her chocolate, caffeine, and salt intake and work on stress reduction.  Confirmed the pharmacy of choice with the pt.   Pt verbalized understanding and agrees with this plan.

## 2022-05-11 ENCOUNTER — Other Ambulatory Visit: Payer: Self-pay | Admitting: Family Medicine

## 2022-06-01 ENCOUNTER — Encounter: Payer: Self-pay | Admitting: Cardiovascular Disease

## 2022-06-01 ENCOUNTER — Ambulatory Visit: Payer: Medicare PPO | Attending: Cardiovascular Disease | Admitting: Cardiovascular Disease

## 2022-06-01 VITALS — BP 118/64 | HR 59 | Ht 59.0 in | Wt 115.4 lb

## 2022-06-01 DIAGNOSIS — I739 Peripheral vascular disease, unspecified: Secondary | ICD-10-CM | POA: Diagnosis not present

## 2022-06-01 DIAGNOSIS — E785 Hyperlipidemia, unspecified: Secondary | ICD-10-CM

## 2022-06-01 DIAGNOSIS — I10 Essential (primary) hypertension: Secondary | ICD-10-CM

## 2022-06-01 DIAGNOSIS — G6289 Other specified polyneuropathies: Secondary | ICD-10-CM

## 2022-06-01 NOTE — Patient Instructions (Signed)
Medication Instructions:  No changes *If you need a refill on your cardiac medications before your next appointment, please call your pharmacy*   Lab Work: None ordered If you have labs (blood work) drawn today and your tests are completely normal, you will receive your results only by: MyChart Message (if you have MyChart) OR A paper copy in the mail If you have any lab test that is abnormal or we need to change your treatment, we will call you to review the results.   Testing/Procedures: None ordered   Follow-Up: At [facility], you and your health needs are our priority.  As part of our continuing mission to provide you with exceptional heart care, we have created designated Provider Care Teams.  These Care Teams include your primary Cardiologist (physician) and Advanced Practice Providers (APPs -  Physician Assistants and Nurse Practitioners) who all work together to provide you with the care you need, when you need it.  We recommend signing up for the patient portal called "MyChart".  Sign up information is provided on this After Visit Summary.  MyChart is used to connect with patients for Virtual Visits (Telemedicine).  Patients are able to view lab/test results, encounter notes, upcoming appointments, etc.  Non-urgent messages can be sent to your provider as well.   To learn more about what you can do with MyChart, go to [email or web address].    Your next appointment:   6 month(s)  The format for your next appointment:   In Person  Provider:   Dr. Kirke Corin  Other Instructions A referral has been placed to Neurology.   Important Information About Sugar

## 2022-06-01 NOTE — Progress Notes (Signed)
Cardiology Office Note   Date:  06/01/2022   ID:  Eileen Hansen, Eileen Hansen 1946/04/16, MRN [id number]  PCP:  [name], [name], MD  Cardiologist: Dr. Shari Prows  No chief complaint on file.      History of Present Illness: Eileen Hansen is a 76 y.o. female who is here today for follow-up visit regarding peripheral arterial disease.   She has known history of mild mitral and aortic regurgitation, essential hypertension, diet-controlled diabetes and hyperlipidemia.  Carotid Doppler in September of 2022 showed mild nonobstructive disease. She was seen few months ago for bilateral calf claudication worse on the right side. Lower extremity arterial Doppler showed an ABI of 0.81 on the right and 0.91 on the left.  Duplex showed significant stenosis in the right mid SFA and moderate disease affecting the left SFA.  She had repeat Doppler studies last month which showed an ABI of 0.94 on the right and 0.78 on the left.  Duplex showed moderate SFA disease bilaterally.  She complains of numbness in both feet.  In addition, her legs feel heavy and stiff in the morning and they stay stiff for about 10 minutes with significant tingling.  Her symptoms usually improve throughout the day especially when she walks.  She does have significant arthritis in both knees.  Past Medical History:  Diagnosis Date   Allergic rhinitis    Arthralgia 09/30/2015   Arthritis    fingers   Bruises easily and skin thin 09/11/2021   Chronic  bilateral shoulder pain left shoulder limited rom 12/07/2016   Heart murmur 12/07/2016   HTN (hypertension)    Lexiscan Myoview 04/2021    Myoview 10/22: EF 78, no ischemia or infarction; low risk   mild Aortic valve insufficiency    Mild Mitral valve regurgitation    PAD (peripheral artery disease) (HCC)    Pain in the wrist, unspecified laterality 03/03/2014   Personal history of COVID-19 04/2021   mild symptoms x 1 week all symptoms resolved   Prolapse of anterior vaginal  wall    Tachycardia    Episodic   Upper back pain    Wears glasses for reading    Wears partial denture upper     Past Surgical History:  Procedure Laterality Date   ANTERIOR AND POSTERIOR REPAIR WITH SACROSPINOUS FIXATION N/A 09/14/2021   Procedure: ANTERIOR REPAIR WITH SACROSPINOUS FIXATION;  Surgeon: [name], MD;  Location: [facility];  Service: Gynecology;  Laterality: N/A;   APPENDECTOMY  76 yrs old   BUNIONECTOMY Bilateral    more than 40 yrs ago   colonscopy  2018   CYSTOSCOPY N/A 09/14/2021   Procedure: CYSTOSCOPY;  Surgeon: [name], MD;  Location: [facility];  Service: Gynecology;  Laterality: N/A;   WISDOM TOOTH EXTRACTION  76 yrs old     Current Outpatient Medications  Medication Sig Dispense Refill   aspirin EC 81 MG tablet Take 81 mg by mouth daily. Swallow whole.     carvedilol (COREG) 12.5 MG tablet Take 1 tablet (12.5 mg total) by mouth 2 (two) times daily. 180 tablet 2   Cholecalciferol (VITAMIN D3) 25 MCG (1000 UT) CAPS Take 1 capsule by mouth daily.     glucosamine-chondroitin 500-400 MG tablet Take 1 tablet by mouth 3 (three) times daily.     hydrochlorothiazide (HYDRODIURIL) 25 MG tablet TAKE ONE TABLET BY MOUTH ONE TIME DAILY 90 tablet 2   lisinopril (ZESTRIL) 10 MG tablet TAKE TWO  TABLETS BY MOUTH DAILY 180 tablet 2   Magnesium 200 MG TABS Take 1 tablet (200 mg total) by mouth at bedtime. (Patient taking differently: Take 240 mg by mouth at bedtime. Magnesium glyconate takes 240 mg 2 tabs at hs) 30 tablet 11   omega-3 acid ethyl esters (LOVAZA) 1 g capsule TAKE ONE CAPSULE BY MOUTH TWICE DAILY 180 capsule 0   rosuvastatin (CRESTOR) 20 MG tablet TAKE ONE TABLET BY MOUTH ONE TIME DAILY 90 tablet 0   trimethoprim-polymyxin b (POLYTRIM) ophthalmic solution Place 2 drops into the left eye every 4 (four) hours. 10 mL 0   vitamin B-12 (CYANOCOBALAMIN) 1000 MCG tablet Take 1,000 mcg by mouth daily.     No  current facility-administered medications for this visit.    Allergies:   Fenofibrate and Penicillins    Social History:  The patient  reports that she has never smoked. She has never used smokeless tobacco. She reports current alcohol use. She reports that she does not use drugs.   Family History:  The patient's family history includes Cancer in her brother and father; Diabetes in her mother; Heart attack in her brother and mother; Hyperlipidemia in her mother; Hypertension in her mother; Stroke in her mother.    ROS:  Please see the history of present illness.   Otherwise, review of systems are positive for none.   All other systems are reviewed and negative.    PHYSICAL EXAM: VS:  BP 118/64 (BP Location: Left Arm, Patient Position: Sitting, Cuff Size: Normal)   Pulse (!) 59   Ht 4\' 11"  (1.499 m)   Wt 115 lb 6.4 oz (52.3 kg)   SpO2 98%   BMI 23.31 kg/m  , BMI Body mass index is 23.31 kg/m. GEN: Well nourished, well developed, in no acute distress  HEENT: normal  Neck: no JVD, carotid bruits, or masses Cardiac: RRR; no murmurs, rubs, or gallops,no edema  Respiratory:  clear to auscultation bilaterally, normal work of breathing GI: soft, nontender, nondistended, + BS MS: no deformity or atrophy  Skin: warm and dry, no rash Neuro:  Strength and sensation are intact Psych: euthymic mood, full affect Vascular: Radial pulse:normal bilaterally.  Femoral: Normal bilaterally.  Dorsalis pedis and posterior tibial are +1 bilaterally.  EKG:  EKG ordered today. EKG showed normal sinus rhythm with no significant ST or T wave changes.  There is sinus arrhythmia.   Recent Labs: 07/30/2021: ALT 11 08/25/2021: TSH 2.28 11/06/2021: BUN 21; Creatinine, Ser 0.65; Hemoglobin 13.7; Platelets 275.0; Potassium 3.8; Sodium 138    Lipid Panel    Component Value Date/Time   CHOL 103 07/30/2021 1006   TRIG 84 07/30/2021 1006   HDL 32 (L) 07/30/2021 1006   CHOLHDL 3.2 07/30/2021 1006   CHOLHDL 4  02/18/2021 1005   VLDL 26.4 02/18/2021 1005   LDLCALC 54 07/30/2021 1006   LDLCALC 64 03/26/2020 1259   LDLDIRECT 152.0 06/25/2019 1536      Wt Readings from Last 3 Encounters:  06/01/22 115 lb 6.4 oz (52.3 kg)  04/12/22 115 lb (52.2 kg)  03/03/22 112 lb (50.8 kg)          No data to display            ASSESSMENT AND PLAN:  1.  Peripheral arterial disease with intermittent claudication: The patient does have evidence of peripheral arterial disease but it does not seem to be critical enough to be causing all her symptoms at rest.  She has palpable pulses  by exam.  Her current symptoms are highly suggestive of peripheral neuropathy.  In addition, I think knee arthritis is also contributing.  I do not think she benefits from revascularization of her peripheral arterial disease.  Recommend continuing medical therapy.    2.  Essential hypertension: Blood pressure is reasonably controlled.  3.  Hyperlipidemia: I reviewed most recent lipid profile done in January which showed an LDL of 54.  Continue treatment with rosuvastatin.  4.  Peripheral neuropathy: Patient was referred to neurology for evaluation per her request.    Disposition:   FU with me in 6 months  Signed,  Lorine Bears, MD  06/01/2022 5:19 PM    [facility]

## 2022-07-21 ENCOUNTER — Encounter: Payer: Self-pay | Admitting: Family Medicine

## 2022-07-21 ENCOUNTER — Ambulatory Visit (INDEPENDENT_AMBULATORY_CARE_PROVIDER_SITE_OTHER): Payer: BLUE CROSS/BLUE SHIELD | Admitting: Family Medicine

## 2022-07-21 VITALS — BP 118/78 | HR 66 | Temp 98.2°F | Wt 112.0 lb

## 2022-07-21 DIAGNOSIS — J40 Bronchitis, not specified as acute or chronic: Secondary | ICD-10-CM

## 2022-07-21 MED ORDER — METHYLPREDNISOLONE 4 MG PO TBPK: ORAL_TABLET | ORAL | 0 refills | Status: DC

## 2022-07-21 MED ORDER — AZITHROMYCIN 250 MG PO TABS: ORAL_TABLET | ORAL | 0 refills | Status: DC

## 2022-07-21 NOTE — Progress Notes (Signed)
   Subjective:    Patient ID: Eileen Hansen, female    DOB: [birth year], 77 y.o.   MRN: [id number]  HPI Here for 3 weeks of chest congestion and a dry cough. No fever or ST or body aches. No SOB.    Review of Systems  Constitutional: Negative.   HENT:  Positive for congestion and postnasal drip. Negative for ear pain, sinus pressure and sore throat.   Eyes: Negative.   Respiratory:  Positive for cough. Negative for shortness of breath and wheezing.        Objective:   Physical Exam Constitutional:      Appearance: Normal appearance. She is not ill-appearing.  HENT:     Right Ear: Tympanic membrane, ear canal and external ear normal.     Left Ear: Tympanic membrane, ear canal and external ear normal.     Nose: Nose normal.     Mouth/Throat:     Pharynx: Oropharynx is clear.  Eyes:     Conjunctiva/sclera: Conjunctivae normal.  Pulmonary:     Effort: Pulmonary effort is normal.     Breath sounds: Normal breath sounds.  Lymphadenopathy:     Cervical: No cervical adenopathy.  Neurological:     Mental Status: She is alert.           Assessment & Plan:  Bronchitis, treat with a Zpack and a Medrol dose pack. Alysia Penna, MD

## 2022-08-02 ENCOUNTER — Other Ambulatory Visit: Payer: Self-pay

## 2022-08-02 MED ORDER — ROSUVASTATIN CALCIUM 20 MG PO TABS: 20.0000 mg | ORAL_TABLET | Freq: Every day | ORAL | 1 refills | Status: DC

## 2022-08-03 ENCOUNTER — Encounter: Payer: Self-pay | Admitting: Neurology

## 2022-08-03 ENCOUNTER — Ambulatory Visit (INDEPENDENT_AMBULATORY_CARE_PROVIDER_SITE_OTHER): Payer: Medicare Other | Admitting: Neurology

## 2022-08-03 VITALS — BP 157/79 | HR 62 | Ht 59.0 in | Wt 118.0 lb

## 2022-08-03 DIAGNOSIS — M2550 Pain in unspecified joint: Secondary | ICD-10-CM

## 2022-08-03 DIAGNOSIS — R200 Anesthesia of skin: Secondary | ICD-10-CM

## 2022-08-03 DIAGNOSIS — M25511 Pain in right shoulder: Secondary | ICD-10-CM | POA: Diagnosis not present

## 2022-08-03 DIAGNOSIS — G8929 Other chronic pain: Secondary | ICD-10-CM

## 2022-08-03 DIAGNOSIS — M791 Myalgia, unspecified site: Secondary | ICD-10-CM | POA: Diagnosis not present

## 2022-08-03 DIAGNOSIS — R5383 Other fatigue: Secondary | ICD-10-CM | POA: Diagnosis not present

## 2022-08-03 DIAGNOSIS — M25512 Pain in left shoulder: Secondary | ICD-10-CM

## 2022-08-03 DIAGNOSIS — M542 Cervicalgia: Secondary | ICD-10-CM | POA: Diagnosis not present

## 2022-08-03 DIAGNOSIS — R269 Unspecified abnormalities of gait and mobility: Secondary | ICD-10-CM

## 2022-08-03 NOTE — Progress Notes (Signed)
[facility]  PATIENT: Eileen Hansen DOB: 1946/05/25  REFERRING DOCTOR OR PCP: Cheri Rous. Kary Kos, MD; Betty [name], MD SOURCE: Patient, notes from primary care, imaging and lab results.  _________________________________   HISTORICAL  CHIEF COMPLAINT:  Chief Complaint  Patient presents with   Room 10    Pt is here Alone. Pt states that she is in pain right now. Pt states that she has pain in hands, toes, legs, and fingers, and she gets numb in her hands and feet.     HISTORY OF PRESENT ILLNESS:  I had the pleasure of seeing your patient, Eileen Hansen, at [facility] for neurologic consultation regarding her myalgias, joint pain and dysesthesias.  She is a 77 year old with PAD who is reporting an unstable gait, especially when she first wakes up.   Balance is poor but no falls .  She feels she walks better later in the day but not as well as she did before last year.      She has numbness, tingling and pain in toes, feet and fingers.   She has pain and swelling in the left great toe and adjacent area.     She has shooting pain in her arms and shoulders and sometimes heads.  She feels she is losing muscle mass and feels her skin is very dry.   .  Pain worsened about 6 months ago.     Tingling sometimes wakes her up at night and she needs to move around or open/cose fists to feel better.      She feels her hands are weaker and she also feels some weakness in hips and shoulders.      She notes she bruises easily (currently one of her fingers and near the right knee.  She feels very tired and is needing more rest.  She feels short of breath when she lays down.  She tries to stay active and has exercise pedals.    She had low Vit D3 and takes supplements.  She has been on a statin x 3 years for elevated cholesterol (much longer thn onset of symptoms)  11/06/2021:  B12, SPEP were normal  IMAGING MRI Cervical spine 05/18/2012 shows mild multilevel  degenerative changes at C3-C4 to C6-C7.  There is foraminal narrowing more to the left at C6-C7 and milder foraminal narrowing at other levels.  No moderate or severe spinal stenosis.  CT scan of the head 79/22/2016 was normal for age.  08/25/2021:  TSH was normal 08/2021  REVIEW OF SYSTEMS: Constitutional: No fevers, chills, sweats, or change in appetite Eyes: No visual changes, double vision, eye pain Ear, nose and throat: No hearing loss, ear pain, nasal congestion, sore throat Cardiovascular: No chest pain, palpitations Respiratory:  No shortness of breath at rest or with exertion.   No wheezes GastrointestinaI: No nausea, vomiting, diarrhea, abdominal pain, fecal incontinence Genitourinary:  No dysuria, urinary retention or frequency.  No nocturia. Musculoskeletal: Myalgias and joint pain as above  integumentary: No rash, pruritus, skin lesions Neurological: as above Psychiatric: No depression at this time.  No anxiety Endocrine: No palpitations, diaphoresis, change in appetite, change in weigh or increased thirst Hematologic/Lymphatic:  No anemia, purpura, petechiae. Allergic/Immunologic: No itchy/runny eyes, nasal congestion, recent allergic reactions, rashes  ALLERGIES: Allergies  Allergen Reactions   Fenofibrate Other (See Comments)    Abdominal pain   Penicillins Rash    REACTION: Hives Anaphylaxis    HOME MEDICATIONS:  Current Outpatient Medications:  aspirin EC 81 MG tablet, Take 81 mg by mouth daily. Swallow whole., Disp: , Rfl:    carvedilol (COREG) 12.5 MG tablet, Take 1 tablet (12.5 mg total) by mouth 2 (two) times daily., Disp: 180 tablet, Rfl: 2   Cholecalciferol (VITAMIN D3) 25 MCG (1000 UT) CAPS, Take 1 capsule by mouth daily., Disp: , Rfl:    glucosamine-chondroitin 500-400 MG tablet, Take 1 tablet by mouth 3 (three) times daily., Disp: , Rfl:    hydrochlorothiazide (HYDRODIURIL) 25 MG tablet, TAKE ONE TABLET BY MOUTH ONE TIME DAILY, Disp: 90 tablet, Rfl: 2    lisinopril (ZESTRIL) 10 MG tablet, TAKE TWO TABLETS BY MOUTH DAILY, Disp: 180 tablet, Rfl: 2   Magnesium 200 MG TABS, Take 1 tablet (200 mg total) by mouth at bedtime., Disp: 30 tablet, Rfl: 11   omega-3 acid ethyl esters (LOVAZA) 1 g capsule, TAKE ONE CAPSULE BY MOUTH TWICE DAILY, Disp: 180 capsule, Rfl: 0   rosuvastatin (CRESTOR) 20 MG tablet, Take 1 tablet (20 mg total) by mouth daily., Disp: 90 tablet, Rfl: 1   vitamin B-12 (CYANOCOBALAMIN) 1000 MCG tablet, Take 1,000 mcg by mouth daily., Disp: , Rfl:    azithromycin (ZITHROMAX Z-PAK) 250 MG tablet, As directed (Patient not taking: Reported on 08/03/2022), Disp: 6 each, Rfl: 0   methylPREDNISolone (MEDROL DOSEPAK) 4 MG TBPK tablet, As directed (Patient not taking: Reported on 08/03/2022), Disp: 21 tablet, Rfl: 0  PAST MEDICAL HISTORY: Past Medical History:  Diagnosis Date   Allergic rhinitis    Arthralgia 09/30/2015   Arthritis    fingers   Bruises easily and skin thin 09/11/2021   Chronic  bilateral shoulder pain left shoulder limited rom 12/07/2016   Heart murmur 12/07/2016   HTN (hypertension)    Lexiscan Myoview 04/2021    Myoview 10/22: EF 78, no ischemia or infarction; low risk   mild Aortic valve insufficiency    Mild Mitral valve regurgitation    PAD (peripheral artery disease) (HCC)    Pain in the wrist, unspecified laterality 03/03/2014   Personal history of COVID-19 04/2021   mild symptoms x 1 week all symptoms resolved   Prolapse of anterior vaginal wall    Tachycardia    Episodic   Upper back pain    Wears glasses for reading    Wears partial denture upper     PAST SURGICAL HISTORY: Past Surgical History:  Procedure Laterality Date   ANTERIOR AND POSTERIOR REPAIR WITH SACROSPINOUS FIXATION N/A 09/14/2021   Procedure: ANTERIOR REPAIR WITH SACROSPINOUS FIXATION;  Surgeon: Jaquita Folds, MD;  Location: [facility];  Service: Gynecology;  Laterality: N/A;   APPENDECTOMY  77 yrs old    BUNIONECTOMY Bilateral    more than 40 yrs ago   colonscopy  2018   CYSTOSCOPY N/A 09/14/2021   Procedure: CYSTOSCOPY;  Surgeon: Jaquita Folds, MD;  Location: [facility];  Service: Gynecology;  Laterality: N/A;   WISDOM TOOTH EXTRACTION  77 yrs old    FAMILY HISTORY: Family History  Problem Relation Age of Onset   Cancer Father        melanoma   Diabetes Mother    Hypertension Mother    Hyperlipidemia Mother    Stroke Mother    Heart attack Mother    Heart attack Brother    Cancer Brother        pancreatic   Colon cancer Neg Hx    Breast cancer Neg Hx    Coronary artery disease  Neg Hx     SOCIAL HISTORY: Social History   Socioeconomic History   Marital status: Married    Spouse name: Not on file   Number of children: 1   Years of education: Not on file   Highest education level: Not on file  Occupational History   Occupation: Homemaker    Employer: RETIRED  Tobacco Use   Smoking status: Never   Smokeless tobacco: Never  Vaping Use   Vaping Use: Never used  Substance and Sexual Activity   Alcohol use: Yes    Comment:  occ   Drug use: Never   Sexual activity: Not Currently    Comment: lives with husband  Other Topics Concern   Not on file  Social History Narrative   University - [place], Masters Degree - counseling, [employer]   Married '69 - 7 years, divorced; married '82   21 daughter - '72; 2 grandchildren      Regular Exercise -  YES         Social Determinants of Health   Financial Resource Strain: Low Risk  (02/24/2022)   Overall Financial Resource Strain (CARDIA)    Difficulty of Paying Living Expenses: Not hard at all  Food Insecurity: No Food Insecurity (02/24/2022)   Hunger Vital Sign    Worried About Running Out of Food in the Last Year: Never true    Ran Out of Food in the Last Year: Never true  Transportation Needs: No Transportation Needs (02/24/2022)   PRAPARE - Administrator, Civil Service  (Medical): No    Lack of Transportation (Non-Medical): No  Physical Activity: Inactive (02/24/2022)   Exercise Vital Sign    Days of Exercise per Week: 0 days    Minutes of Exercise per Session: 0 min  Stress: No Stress Concern Present (02/24/2022)   [employer] of Occupational Health - Occupational Stress Questionnaire    Feeling of Stress : Not at all  Social Connections: Moderately Isolated (02/24/2022)   Social Connection and Isolation Panel [NHANES]    Frequency of Communication with Friends and Family: More than three times a week    Frequency of Social Gatherings with Friends and Family: More than three times a week    Attends Religious Services: Never    Database administrator or Organizations: No    Attends Banker Meetings: Never    Marital Status: Married  Catering manager Violence: Not At Risk (02/24/2022)   Humiliation, Afraid, Rape, and Kick questionnaire    Fear of Current or Ex-Partner: No    Emotionally Abused: No    Physically Abused: No    Sexually Abused: No       PHYSICAL EXAM  There were no vitals filed for this visit.  There is no height or weight on file to calculate BMI.   General: The patient is well-developed and well-nourished and in no acute distress  HEENT:  Head is [place]/AT.  Sclera are anicteric.  Funduscopic exam shows normal optic discs and retinal vessels.  Neck: No carotid bruits are noted.  The neck is mildly tender with reduced ROM.  Cardiovascular: The heart has a regular rate and rhythm with a normal S1 and S2. There were no murmurs, gallops or rubs.    Skin: Extremities are without rash or  edema.  Musculoskeletal:  Bilateral tender shoulder over subacromial bursa.  Reduced ROM .  Warm swollen near great toe on left  Neurologic Exam  Mental status: The  patient is alert and oriented x 3 at the time of the examination. The patient has apparent normal recent and remote memory, with an apparently normal attention span and  concentration ability.   Speech is normal.  Cranial nerves: Extraocular movements are full.  Facial strength and sensation was normal.. No dysarthria is noted.  The tongue is midline, and the patient has symmetric elevation of the soft palate. No obvious hearing deficits are noted.  Motor:  Muscle bulk is normal.   Tone is normal. Strength is  5 / 5 in all 4 extremities. Except 4+/5 left EHL  Sensory: Sensory testing shows reduced sensation at the ankles (80% and further reduced sensation at the toes (20%) for vibration sensation.  She had allodynia and hyperpathia in the feet for pinprick sensation  Coordination: Cerebellar testing reveals good finger-nose-finger and heel-to-shin bilaterally.  Gait and station: Station is normal.   Gait is milly wide. Tandem gait is mildly wide. . Romberg is negative.   Reflexes: Deep tendon reflexes are symmetric and normal in arms, 3+ in legs. .   Plantar responses are equivocal.   .    DIAGNOSTIC DATA (LABS, IMAGING, TESTING) - I reviewed patient records, labs, notes, testing and imaging myself where available.  Lab Results  Component Value Date   WBC 7.4 11/06/2021   HGB 13.7 11/06/2021   HCT 39.9 11/06/2021   MCV 89.0 11/06/2021   PLT 275.0 11/06/2021      Component Value Date/Time   NA 138 11/06/2021 1229   K 3.8 11/06/2021 1229   CL 102 11/06/2021 1229   CO2 27 11/06/2021 1229   GLUCOSE 106 (H) 11/06/2021 1229   BUN 21 11/06/2021 1229   CREATININE 0.65 11/06/2021 1229   CREATININE 0.71 03/26/2020 1259   CALCIUM 10.0 11/06/2021 1229   PROT 7.1 07/30/2021 1006   ALBUMIN 4.6 07/30/2021 1006   AST 23 07/30/2021 1006   ALT 11 07/30/2021 1006   ALKPHOS 56 07/30/2021 1006   BILITOT 0.4 07/30/2021 1006   GFRNONAA 83 03/26/2020 1259   GFRAA 96 03/26/2020 1259   Lab Results  Component Value Date   CHOL 103 07/30/2021   HDL 32 (L) 07/30/2021   LDLCALC 54 07/30/2021   LDLDIRECT 152.0 06/25/2019   TRIG 84 07/30/2021   CHOLHDL 3.2  07/30/2021   Lab Results  Component Value Date   HGBA1C 6.0 08/25/2021   Lab Results  Component Value Date   VITAMINB12 501 11/06/2021   Lab Results  Component Value Date   TSH 2.28 08/25/2021       ASSESSMENT AND PLAN  Myalgia - Plan: Thyroid Panel With TSH, CK, Sedimentation rate, C-reactive protein, Uric Acid, MR CERVICAL SPINE WO CONTRAST, ANA+ENA+DNA/DS+Scl 70+SjoSSA/B, Rheumatoid factor  Numbness  Multiple joint pain - Plan: Thyroid Panel With TSH, CK, Sedimentation rate, C-reactive protein, Uric Acid, ANA+ENA+DNA/DS+Scl 70+SjoSSA/B, Rheumatoid factor  Other fatigue - Plan: Thyroid Panel With TSH, CK, Sedimentation rate, C-reactive protein, ANA+ENA+DNA/DS+Scl 70+SjoSSA/B, Rheumatoid factor  Chronic pain of both shoulders - Plan: MR CERVICAL SPINE WO CONTRAST  Neck pain - Plan: Rheumatoid factor  Gait disturbance   In summary, Eileen Hansen is a 77 year old woman with myalgias, joint pain and dysesthesias.  On exam, she is tender over the subacromial bursae of both shoulders and she has reduced range of motion there.  She has warmth and swelling of the great toe on the left.  Mild myalgias are noted to deep palpation.  We will check labs for inflammatory etiology (ESR,  CRP, rheumatoid factor, ANA) and also check uric acid, thyroid function and CK.  To help with the shoulder pain I did subacromial bursa injections bilaterally.  Pain was much better afterwards though she did not note much improvement of range of motion.  Based on the results of the testing, she may need additional therapy or referral.  She has not a great candidate for NSAIDs due to PAD and aspirin use.  She will return to see [place] in 3 to 4 months or sooner if there are new or worsening neurologic symptoms.  Thank you for asking [place] to see Eileen Hansen.  Please let us know if we can be of further assistance with her or other patients in the future.    Jimi Giza A. Felecia Shelling, MD, [facility] 1/63/8466, 5:99 PM Certified  in Neurology, Clinical Neurophysiology, Sleep Medicine and Neuroimaging  [facility] [address] [place], [place] [address] [phone]

## 2022-08-05 LAB — RHEUMATOID FACTOR: Rheumatoid fact SerPl-aCnc: <10 [IU]/mL

## 2022-08-05 LAB — ANA+ENA+DNA/DS+SCL 70+SJOSSA/B
ANA Titer 1: NEGATIVE
ENA RNP Ab: <0.2 AI (ref 0.0–0.9)
ENA SM Ab Ser-aCnc: <0.2 AI (ref 0.0–0.9)
ENA SSA (RO) Ab: <0.2 AI (ref 0.0–0.9)
ENA SSB (LA) Ab: <0.2 AI (ref 0.0–0.9)
Scleroderma (Scl-70) (ENA) Antibody, IgG: <0.2 AI (ref 0.0–0.9)
dsDNA Ab: <1 [IU]/mL (ref 0–9)

## 2022-08-05 LAB — C-REACTIVE PROTEIN: CRP: <1 mg/L (ref 0–10)

## 2022-08-05 LAB — THYROID PANEL WITH TSH
Free Thyroxine Index: 2.4 (ref 1.2–4.9)
T3 Uptake Ratio: 26 % (ref 24–39)
T4, Total: 9.3 ug/dL (ref 4.5–12.0)
TSH: 3.06 u[IU]/mL (ref 0.450–4.500)

## 2022-08-05 LAB — CK: Total CK: 28 U/L — ABNORMAL LOW (ref 32–182)

## 2022-08-05 LAB — SEDIMENTATION RATE: Sed Rate: 3 mm/hr (ref 0–40)

## 2022-08-05 LAB — URIC ACID: Uric Acid: 7.2 mg/dL (ref 3.1–7.9)

## 2022-08-11 ENCOUNTER — Telehealth: Payer: Self-pay | Admitting: Neurology

## 2022-08-11 NOTE — Telephone Encounter (Signed)
[employer]BS medicare Josem Kaufmann: [id number] exp. 08/11/22-09/09/22, Dillon Bjork: [id number] exp. 08/11/22-09/09/22 sent to GI [phone]

## 2022-08-13 ENCOUNTER — Other Ambulatory Visit: Payer: Self-pay | Admitting: Family Medicine

## 2022-08-13 DIAGNOSIS — I10 Essential (primary) hypertension: Secondary | ICD-10-CM

## 2022-08-17 ENCOUNTER — Telehealth: Payer: Self-pay

## 2022-08-17 NOTE — Progress Notes (Unsigned)
ACUTE VISIT No chief complaint on file.  HPI: Eileen Hansen is a 77 y.o. female, who is here today complaining of *** HPI  Review of Systems See other pertinent positives and negatives in HPI.  Current Outpatient Medications on File Prior to Visit  Medication Sig Dispense Refill   aspirin EC 81 MG tablet Take 81 mg by mouth daily. Swallow whole.     azithromycin (ZITHROMAX Z-PAK) 250 MG tablet As directed (Patient not taking: Reported on 08/03/2022) 6 each 0   carvedilol (COREG) 12.5 MG tablet Take 1 tablet (12.5 mg total) by mouth 2 (two) times daily. 180 tablet 2   Cholecalciferol (VITAMIN D3) 25 MCG (1000 UT) CAPS Take 1 capsule by mouth daily.     glucosamine-chondroitin 500-400 MG tablet Take 1 tablet by mouth 3 (three) times daily.     hydrochlorothiazide (HYDRODIURIL) 25 MG tablet TAKE ONE TABLET BY MOUTH ONE TIME DAILY 90 tablet 1   lisinopril (ZESTRIL) 10 MG tablet TAKE TWO TABLETS BY MOUTH DAILY 180 tablet 2   Magnesium 200 MG TABS Take 1 tablet (200 mg total) by mouth at bedtime. 30 tablet 11   methylPREDNISolone (MEDROL DOSEPAK) 4 MG TBPK tablet As directed (Patient not taking: Reported on 08/03/2022) 21 tablet 0   omega-3 acid ethyl esters (LOVAZA) 1 g capsule TAKE ONE CAPSULE BY MOUTH TWICE DAILY 180 capsule 1   rosuvastatin (CRESTOR) 20 MG tablet Take 1 tablet (20 mg total) by mouth daily. 90 tablet 1   vitamin B-12 (CYANOCOBALAMIN) 1000 MCG tablet Take 1,000 mcg by mouth daily.     No current facility-administered medications on file prior to visit.    Past Medical History:  Diagnosis Date   Allergic rhinitis    Arthralgia 09/30/2015   Arthritis    fingers   Bruises easily and skin thin 09/11/2021   Chronic  bilateral shoulder pain left shoulder limited rom 12/07/2016   Heart murmur 12/07/2016   HTN (hypertension)    Lexiscan Myoview 04/2021    Myoview 10/22: EF 78, no ischemia or infarction; low risk   mild Aortic valve insufficiency    Mild Mitral valve  regurgitation    PAD (peripheral artery disease) (HCC)    Pain in the wrist, unspecified laterality 03/03/2014   Personal history of COVID-19 04/2021   mild symptoms x 1 week all symptoms resolved   Prolapse of anterior vaginal wall    Tachycardia    Episodic   Upper back pain    Wears glasses for reading    Wears partial denture upper    Allergies  Allergen Reactions   Fenofibrate Other (See Comments)    Abdominal pain   Penicillins Rash    REACTION: Hives Anaphylaxis    Social History   Socioeconomic History   Marital status: Married    Spouse name: Not on file   Number of children: 1   Years of education: Not on file   Highest education level: Not on file  Occupational History   Occupation: Homemaker    Employer: RETIRED  Tobacco Use   Smoking status: Never   Smokeless tobacco: Never  Vaping Use   Vaping Use: Never used  Substance and Sexual Activity   Alcohol use: Yes    Comment:  occ   Drug use: Never   Sexual activity: Not Currently    Comment: lives with husband  Other Topics Concern   Not on file  Social History Narrative   University - [place], Masters Degree -  counseling, [employer]   Married '69 - 45 years, divorced; married '82   21 daughter - '72; 2 grandchildren      Regular Exercise -  YES         Social Determinants of Health   Financial Resource Strain: Low Risk  (02/24/2022)   Overall Financial Resource Strain (CARDIA)    Difficulty of Paying Living Expenses: Not hard at all  Food Insecurity: No Food Insecurity (02/24/2022)   Hunger Vital Sign    Worried About Running Out of Food in the Last Year: Never true    Ran Out of Food in the Last Year: Never true  Transportation Needs: No Transportation Needs (02/24/2022)   PRAPARE - Hydrologist (Medical): No    Lack of Transportation (Non-Medical): No  Physical Activity: Inactive (02/24/2022)   Exercise Vital Sign    Days of Exercise per Week: 0 days     Minutes of Exercise per Session: 0 min  Stress: No Stress Concern Present (02/24/2022)   [place]    Feeling of Stress : Not at all  Social Connections: Moderately Isolated (02/24/2022)   Social Connection and Isolation Panel [NHANES]    Frequency of Communication with Friends and Family: More than three times a week    Frequency of Social Gatherings with Friends and Family: More than three times a week    Attends Religious Services: Never    Marine scientist or Organizations: No    Attends Archivist Meetings: Never    Marital Status: Married    There were no vitals filed for this visit. There is no height or weight on file to calculate BMI.  Physical Exam  ASSESSMENT AND PLAN: There are no diagnoses linked to this encounter.  No follow-ups on file.  Rakia Frayne G. [place], MD  [facility]. [place] office.  Discharge Instructions   None

## 2022-08-18 ENCOUNTER — Encounter: Payer: Self-pay | Admitting: Family Medicine

## 2022-08-18 ENCOUNTER — Ambulatory Visit (INDEPENDENT_AMBULATORY_CARE_PROVIDER_SITE_OTHER): Payer: Medicare Other | Admitting: Family Medicine

## 2022-08-18 VITALS — BP 120/72 | HR 70 | Resp 16 | Ht 59.0 in | Wt 113.0 lb

## 2022-08-18 DIAGNOSIS — R7303 Prediabetes: Secondary | ICD-10-CM

## 2022-08-18 DIAGNOSIS — R202 Paresthesia of skin: Secondary | ICD-10-CM

## 2022-08-18 DIAGNOSIS — I739 Peripheral vascular disease, unspecified: Secondary | ICD-10-CM | POA: Diagnosis not present

## 2022-08-18 DIAGNOSIS — I10 Essential (primary) hypertension: Secondary | ICD-10-CM

## 2022-08-18 DIAGNOSIS — M791 Myalgia, unspecified site: Secondary | ICD-10-CM

## 2022-08-18 LAB — BASIC METABOLIC PANEL
BUN: 26 mg/dL — ABNORMAL HIGH (ref 6–23)
CO2: 28 mEq/L (ref 19–32)
Calcium: 9.9 mg/dL (ref 8.4–10.5)
Chloride: 101 mEq/L (ref 96–112)
Creatinine, Ser: 0.63 mg/dL (ref 0.40–1.20)
GFR: 84.95 mL/min (ref >60.00)
Glucose, Bld: 149 mg/dL — ABNORMAL HIGH (ref 70–99)
Potassium: 3.7 mEq/L (ref 3.5–5.1)
Sodium: 138 mEq/L (ref 135–145)

## 2022-08-18 LAB — HEMOGLOBIN A1C: Hgb A1c MFr Bld: 6.2 % (ref 4.6–6.5)

## 2022-08-18 NOTE — Assessment & Plan Note (Addendum)
BP here in the office adequately controlled and reported similar numbers at home. Continue carvedilol 12.5 mg twice daily, lisinopril 10 mg daily,and HCTZ 25 mg daily. Monitor BP at home and continue low salt diet.

## 2022-08-18 NOTE — Assessment & Plan Note (Signed)
Affecting some areas of feet and hands. We discussed possible etiologies, including systemic conditions, for which I recommend going ahead with having brain MRI as instructed by her neurologist. Carpal tunnel syndrome could explain numbness and tingling of fingers. Stretching exercises have helped, night wrist splint could be tried. We discussed treatment options, medications like gabapentin may help, discussed side effects, she prefers to hold on adding new medication. Continue monitoring for new symptoms. Follows with neurologist.

## 2022-08-18 NOTE — Assessment & Plan Note (Signed)
HgA1C was 6.0 in 08/2021. Continue a healthy life style for diabetes prevention. Further recommendations according to HgA1C result.

## 2022-08-18 NOTE — Patient Instructions (Addendum)
A few things to remember from today's visit:  PAD (peripheral artery disease) ([place])  Essential hypertension - Plan: Basic metabolic panel  Prediabetes - Plan: Hemoglobin A1c  No cambios hoy. Si quiere puede parar crestor por 2-3 semanas y [employer].  If you need refills for medications you take chronically, please call your pharmacy. Do not use My Chart to request refills or for acute issues that need immediate attention. If you send a my chart message, it may take a few days to be addressed, specially if I am not in the office.  Please be sure medication list is accurate. If a new problem present, please set up appointment sooner than planned today.

## 2022-08-18 NOTE — Assessment & Plan Note (Signed)
Mainly around calves. This is not a new problem, started initially in right calf, thought to be caused by PAD but now affecting the left one.  No associated edema or erythema. Recent work up ordered by her neurologist was otherwise negative. Statin could certainly be a contributing factor, we discussed CV benefits. She could hold on rosuvastatin for 2 to 3 weeks and monitor for changes.  If problem resolves or greatly improved, we had to decide about reducing dose of rosuvastatin or trying a different statin.

## 2022-08-18 NOTE — Assessment & Plan Note (Signed)
Continue Aspirin 81 mg daily and rosuvastatin. Last LDL 54 in 07/2021. She has an appointment with cardiologist in 09/2022.

## 2022-08-25 ENCOUNTER — Ambulatory Visit
Admission: RE | Admit: 2022-08-25 | Discharge: 2022-08-25 | Disposition: A | Discharge status: Home / Self Care | Payer: BC Managed Care – PPO | Source: Ambulatory Visit | Attending: Neurology | Admitting: Neurology

## 2022-08-25 DIAGNOSIS — M791 Myalgia, unspecified site: Secondary | ICD-10-CM

## 2022-08-25 DIAGNOSIS — M25512 Pain in left shoulder: Secondary | ICD-10-CM | POA: Diagnosis not present

## 2022-08-25 DIAGNOSIS — M25511 Pain in right shoulder: Secondary | ICD-10-CM | POA: Diagnosis not present

## 2022-08-25 DIAGNOSIS — G8929 Other chronic pain: Secondary | ICD-10-CM

## 2022-09-01 ENCOUNTER — Other Ambulatory Visit: Payer: Self-pay | Admitting: Family Medicine

## 2022-09-01 DIAGNOSIS — I10 Essential (primary) hypertension: Secondary | ICD-10-CM

## 2022-09-02 ENCOUNTER — Other Ambulatory Visit: Payer: Self-pay

## 2022-09-02 MED ORDER — CARVEDILOL 12.5 MG PO TABS: 12.5000 mg | ORAL_TABLET | Freq: Two times a day (BID) | ORAL | 2 refills | Status: DC

## 2022-09-04 ENCOUNTER — Other Ambulatory Visit: Payer: Self-pay | Admitting: Family Medicine

## 2022-09-13 ENCOUNTER — Telehealth: Payer: Self-pay | Admitting: Family Medicine

## 2022-09-13 MED ORDER — ROSUVASTATIN CALCIUM 20 MG PO TABS: 20.0000 mg | ORAL_TABLET | Freq: Every day | ORAL | 1 refills | Status: DC

## 2022-09-13 MED ORDER — OMEGA-3-ACID ETHYL ESTERS 1 G PO CAPS: 1.0000 | ORAL_CAPSULE | Freq: Two times a day (BID) | ORAL | 2 refills | Status: DC

## 2022-09-13 NOTE — Telephone Encounter (Signed)
Requesting refill omega-3 acid ethyl esters (LOVAZA) 1 g capsule rosuvastatin (CRESTOR) 20 MG tablet   [facility] [place], [place] - [address] Phone: [phone]  Fax: [phone]

## 2022-09-13 NOTE — Telephone Encounter (Signed)
Rx's sent in. °

## 2022-09-17 ENCOUNTER — Telehealth: Payer: Self-pay | Admitting: Family Medicine

## 2022-09-17 NOTE — Telephone Encounter (Signed)
Faxed

## 2022-09-17 NOTE — Telephone Encounter (Signed)
Pt called to request that a copy of all of her medications (medication list) be faxed to:  [facility] Fax [phone]  ...at your earliest convenience.  Thank you so much!!

## 2022-09-25 NOTE — Progress Notes (Unsigned)
Cardiology Office Note:    Date:  09/25/2022   ID:  Eileen Hansen, DOB [birth year], MRN [id number]  PCP:  Martinique, [name], MD  [facility] Cardiologist:  Freada Bergeron, MD  [facility] Electrophysiologist:  None   Referring MD: Martinique, [name], MD    History of Present Illness:    Eileen Hansen is a 77 y.o. female with a history of essential HTN, HLD, prediabetes, and OA who returns to clinic for follow-up.  Patient has a history of hypertension that was diagnosed over 10 years ago. She is currently on metop tartrate '25mg'$  BID, lisinopril '10mg'$  daily and HCTZ '25mg'$  daily with well controlled blood pressures.  PCP noted soft systolic murmur on exam for which she was referred to Cardiology for further evaluation.  Was seen on 04/09/20 where she was complaining of RLE pain and knee pain. No anginal symptoms. TTE 03/2021 with LVEF 60-65%, G1DD, trivial MR aortic sclerosis.   She was seen 07/30/2021 where she reported average blood pressures of [id number] systolic. She was concerned about episodes of nocturnal palpitations with shortness of breath. Her metoprolol was changed to coreg 3.125 mg BID.  Was last seen by Dr. Fletcher Anon on 12/01/21 where she had stable claudication symptoms. Was recommended for continued medical therapy and exercise at that time.  Today, the patient states that she is struggling with PAD, as well as bilateral knee and calf pain. Her pain is bothering her severely as it will wake her up at night every 2-3 hours. Sometimes she also notices numbness in her bilateral toes, more noticeable when she is lying down at night. In the morning it is difficult for her to get up out of bed. It may take her 10-15 minutes to get up due to feeling numbness and weakness.  Of note, her symptoms will improve if she is more active. They worsen if she lies down or sits down watching TV. We discussed that this is less likely to be claudication given improvement with activity and possibly related to  neuropathy vs myalgias from crestor.  Every 2-3 days she has episodes of palpitations associated with shortness of breath. These episodes seem to be worse since her last visit.  She also complains of significant arthritis that makes it difficult for her to walk.   Generally she complains of very low energy and feeling fatigued throughout the day, but she is not sleeping well due to her muscle aches, numbness and arthritic pain.  This morning her blood pressure was 150/79 at home. In clinic today her blood pressure is 146/72 (141/82 on recheck). She is worried it is running higher and is wondering if her BP meds should be adjusted.  She denies any palpitations, chest pain, or shortness of breath. No lightheadedness, headaches, syncope, orthopnea, or PND.   Past Medical History:  Diagnosis Date   Allergic rhinitis    Arthralgia 09/30/2015   Arthritis    fingers   Bruises easily and skin thin 09/11/2021   Chronic  bilateral shoulder pain left shoulder limited rom 12/07/2016   Heart murmur 12/07/2016   HTN (hypertension)    Lexiscan Myoview 04/2021    Myoview 10/22: EF 78, no ischemia or infarction; low risk   mild Aortic valve insufficiency    Mild Mitral valve regurgitation    PAD (peripheral artery disease) (HCC)    Pain in the wrist, unspecified laterality 03/03/2014   Personal history of COVID-19 04/2021   mild symptoms x 1 week all symptoms  resolved   Prolapse of anterior vaginal wall    Tachycardia    Episodic   Upper back pain    Wears glasses for reading    Wears partial denture upper     Current Medications: No outpatient medications have been marked as taking for the 09/27/22 encounter (Appointment) with Freada Bergeron, MD.     Allergies:   Fenofibrate and Penicillins   Social History   Tobacco Use   Smoking status: Never   Smokeless tobacco: Never  Vaping Use   Vaping Use: Never used  Substance Use Topics   Alcohol use: Yes    Comment:  occ   Drug  use: Never     Family Hx: The patient's family history includes Cancer in her brother and father; Diabetes in her mother; Heart attack in her brother and mother; Hyperlipidemia in her mother; Hypertension in her mother; Stroke in her mother. There is no history of Colon cancer, Breast cancer, or Coronary artery disease.  Review of Systems  Constitutional:  Positive for malaise/fatigue. Negative for chills and fever.  HENT:  Negative for congestion and sinus pain.   Eyes:  Negative for blurred vision and pain.  Respiratory:  Positive for shortness of breath. Negative for cough.   Cardiovascular:  Positive for palpitations. Negative for chest pain, orthopnea, claudication, leg swelling and PND.  Gastrointestinal:  Negative for blood in stool, heartburn and melena.  Genitourinary:  Negative for frequency and urgency.  Musculoskeletal:  Positive for joint pain and myalgias.  Skin:  Negative for itching and rash.  Neurological:  Positive for focal weakness. Negative for dizziness and headaches.  Endo/Heme/Allergies:  Bruises/bleeds easily.  Psychiatric/Behavioral:  The patient has insomnia. The patient is not nervous/anxious.      EKGs/Labs/Other Test Reviewed:    EKG:  EKG is personally reviewed  04/12/2022:  EKG was not ordered. 04/28/21: Richardson Dopp PA - C) sinus bradycardia at 59 bpm 04/09/20: NSR with HR 87.   Prior CV studies: TTE 03/2021: IMPRESSIONS   1. Left ventricular ejection fraction, by estimation, is 60 to 65%. The  left ventricle has normal function. The left ventricle has no regional  wall motion abnormalities. There is mild left ventricular hypertrophy.  Left ventricular diastolic parameters  are consistent with Grade I diastolic dysfunction (impaired relaxation).   2. Right ventricular systolic function is normal. The right ventricular  size is normal. There is normal pulmonary artery systolic pressure. The  estimated right ventricular systolic pressure is [id number]  mmHg.   3. The mitral valve is normal in structure. Trivial mitral valve  regurgitation. No evidence of mitral stenosis.   4. The aortic valve is tricuspid. Aortic valve regurgitation is trivial.  Mild aortic valve sclerosis is present, with no evidence of aortic valve  stenosis.   5. The inferior vena cava is normal in size with greater than 50%  respiratory variability, suggesting right atrial pressure of 3 mmHg.   Myoview 04/2021:   The study is normal. Findings are consistent with no prior ischemia and no prior myocardial infarction. The study is low risk.   No ST deviation was noted.   LV perfusion is normal. There is no evidence of ischemia. There is no evidence of infarction.   Left ventricular function is normal. Nuclear stress EF: 78 %. The left ventricular ejection fraction is hyperdynamic (>65%). End diastolic cavity size is normal.   Prior study not available for comparison.  TTE 11/2016:  Study Conclusions  - Left ventricle: The  cavity size was normal. Wall thickness was    increased in a pattern of mild LVH. Systolic function was normal.    The estimated ejection fraction was in the range of 60% to 65%.    Wall motion was normal; there were no regional wall motion    abnormalities.  - Aortic valve: There was mild regurgitation.  - Mitral valve: There was mild regurgitation.  - Left atrium: The atrium was mildly dilated.  - Atrial septum: No defect or patent foramen ovale was identified.   Carotid artery ultrasound 11/07/2016: IMPRESSION: Minimal plaque in the carotid arteries. No significant carotid artery stenosis. Patent vertebral arteries.  RLE doppler ultrasound 10/07/19: No DVT  Recent Labs: 11/06/2021: Hemoglobin 13.7; Platelets 275.0 08/03/2022: TSH 3.060 08/18/2022: BUN 26; Creatinine, Ser 0.63; Potassium 3.7; Sodium 138   Recent Lipid Panel Lab Results  Component Value Date/Time   CHOL 103 07/30/2021 10:06 AM   TRIG 84 07/30/2021 10:06 AM   HDL 32 (L)  07/30/2021 10:06 AM   CHOLHDL 3.2 07/30/2021 10:06 AM   CHOLHDL 4 02/18/2021 10:05 AM   LDLCALC 54 07/30/2021 10:06 AM   LDLCALC 64 03/26/2020 12:59 PM   LDLDIRECT 152.0 06/25/2019 03:36 PM    Physical Exam:    VS:  There were no vitals taken for this visit.    Wt Readings from Last 3 Encounters:  08/18/22 113 lb (51.3 kg)  08/03/22 118 lb (53.5 kg)  07/21/22 112 lb (50.8 kg)   GEN:  Well nourished, well developed in no acute distress HEENT: Normal NECK: No JVD; No carotid bruits CARDIAC: RRR,  1/6 systolic murmur RESPIRATORY:  Clear to auscultation without rales, wheezing or rhonchi  ABDOMEN: Soft, non-tender, non-distended MUSCULOSKELETAL: Warm, no edema SKIN: Warm and dry NEUROLOGIC:  Alert and oriented x 3 PSYCHIATRIC:  Normal affect   ASSESSMENT & PLAN:    No diagnosis found.    #LE Pain/Numbness: Symptoms worse when laying down at night and when first waking up in the morning and seem to improve with ambulation. Overall, this does not sound like it is related to claudication. May be due to underlying neuropathy, but will rule out statin related myalgias. Will trial off the crestor and re-evaluate symptoms. If symptoms resolve, will change to a different statin. If symptoms persist, plan to refer to Neuro for work-up for possible LE neuropathy.  -Trial off statin and assess symptoms -If symptoms improve off statin, will change medication -If symptoms unchanged, will refer to neuro for further evaluation of suspected neuropathy -Low suspicion this is related to claudication as symptoms improve with activity  #PAD: Follows with Dr. Fletcher Anon. Discussed that her knee pain, numbness and pain in her legs at night do not sound consistent with claudication. Activity seems to improve symptoms. Agree with continued medical management of PAD and will work-up her other symptoms as above. -Follow-up with Dr. Fletcher Anon as scheduled -LE arterial dopplers with significant stenosis in the  right SFA and moderate disease in left SFA; will continue surveillance imaging per patient preference -Recommended for continued exercise and only revascularize if failing medical therapy  #Suspected palpitations: Patient with episodes where she "catches her breath" like her heart "skips a beat" Symptoms have increased in frequency since last visit occurring up to a couple of times per week. Will check zio for further monitoring. -Check zio monitor x 7days -Continue coreg 6.'25mg'$  BID -If no improvement, can check a cardiac monitor  #Atypical Chest Pain: Resolved with no further episodes. -Myoview 04/2021 without  ischemia/infarction -TTE 03/2021 with normal EF, grade I DD, trivial MR, trivial AI  #Mild Aortic Regurgitation: #Mild Mitral Regurgitation: -TTE on 03/2021 showed trivial MR and AI -Continue serial monitoring  #Essential Hypertension: Elevated to 140s. Will increase coreg at this time. -Continue amlopdipine and HCTZ -Change metop to coreg 6.'25mg'$  BID   #Hyperlipidemia: -LDL 54 07/2021 -Continue crestor '20mg'$  per PCP  #Prediabetes: -HgA1C 6.0 -Follows regularly with PCP  Follow up:  6 months.  Medication Adjustments/Labs and Tests Ordered: Current medicines are reviewed at length with the patient today.  Concerns regarding medicines are outlined above.   Tests Ordered: No orders of the defined types were placed in this encounter.  Medication Changes: No orders of the defined types were placed in this encounter.   I,Mathew Stumpf,acting as a Education administrator for Freada Bergeron, MD.,have documented all relevant documentation on the behalf of Freada Bergeron, MD,as directed by  Freada Bergeron, MD while in the presence of Freada Bergeron, MD.  I, Freada Bergeron, MD, have reviewed all documentation for this visit. The documentation on 09/25/22 for the exam, diagnosis, procedures, and orders are all accurate and complete.   Signed, Freada Bergeron, MD   09/25/2022 12:47 PM    [address], [place], [place]  [address] Phone: [phone]; Fax: [phone]

## 2022-09-27 ENCOUNTER — Ambulatory Visit: Payer: BC Managed Care – PPO | Attending: Cardiology | Admitting: Cardiology

## 2022-09-27 ENCOUNTER — Encounter: Payer: Self-pay | Admitting: Cardiology

## 2022-09-27 VITALS — BP 150/76 | HR 81 | Ht 59.0 in | Wt 116.2 lb

## 2022-09-27 DIAGNOSIS — R002 Palpitations: Secondary | ICD-10-CM

## 2022-09-27 DIAGNOSIS — I739 Peripheral vascular disease, unspecified: Secondary | ICD-10-CM

## 2022-09-27 DIAGNOSIS — I340 Nonrheumatic mitral (valve) insufficiency: Secondary | ICD-10-CM | POA: Diagnosis not present

## 2022-09-27 DIAGNOSIS — E7800 Pure hypercholesterolemia, unspecified: Secondary | ICD-10-CM

## 2022-09-27 DIAGNOSIS — I351 Nonrheumatic aortic (valve) insufficiency: Secondary | ICD-10-CM

## 2022-09-27 DIAGNOSIS — Z79899 Other long term (current) drug therapy: Secondary | ICD-10-CM

## 2022-09-27 DIAGNOSIS — I10 Essential (primary) hypertension: Secondary | ICD-10-CM

## 2022-09-27 MED ORDER — LISINOPRIL 20 MG PO TABS: 20.0000 mg | ORAL_TABLET | Freq: Two times a day (BID) | ORAL | 1 refills | Status: DC

## 2022-09-27 NOTE — Patient Instructions (Signed)
Medication Instructions:   INCREASE YOUR LISINOPRIL TO 20 MG BY MOUTH TWICE DAILY  *If you need a refill on your cardiac medications before your next appointment, please call your pharmacy*   Lab Work:  [place] OFFICE--BMET AND LIPIDS--PLEASE COME FASTING TO THIS LAB APPOINTMENT  If you have labs (blood work) drawn today and your tests are completely normal, you will receive your results only by: [place] (if you have MyChart) OR A paper copy in the mail If you have any lab test that is abnormal or we need to change your treatment, we will call you to review the results.    Follow-Up: At [facility], you and your health needs are our priority.  As part of our continuing mission to provide you with exceptional heart care, we have created designated Provider Care Teams.  These Care Teams include your primary Cardiologist (physician) and Advanced Practice Providers (APPs -  Physician Assistants and Nurse Practitioners) who all work together to provide you with the care you need, when you need it.  We recommend signing up for the patient portal called "MyChart".  Sign up information is provided on this After Visit Summary.  MyChart is used to connect with patients for Virtual Visits (Telemedicine).  Patients are able to view lab/test results, encounter notes, upcoming appointments, etc.  Non-urgent messages can be sent to your provider as well.   To learn more about what you can do with MyChart, go to [email or web address].    Your next appointment:   6 month(s)  Provider:   Freada Bergeron, MD

## 2022-10-06 ENCOUNTER — Ambulatory Visit: Payer: Medicare Other | Attending: Cardiology

## 2022-10-06 DIAGNOSIS — R002 Palpitations: Secondary | ICD-10-CM

## 2022-10-06 DIAGNOSIS — E7800 Pure hypercholesterolemia, unspecified: Secondary | ICD-10-CM

## 2022-10-06 DIAGNOSIS — I340 Nonrheumatic mitral (valve) insufficiency: Secondary | ICD-10-CM

## 2022-10-06 DIAGNOSIS — I739 Peripheral vascular disease, unspecified: Secondary | ICD-10-CM | POA: Diagnosis not present

## 2022-10-06 DIAGNOSIS — I10 Essential (primary) hypertension: Secondary | ICD-10-CM | POA: Diagnosis not present

## 2022-10-06 DIAGNOSIS — Z79899 Other long term (current) drug therapy: Secondary | ICD-10-CM

## 2022-10-06 DIAGNOSIS — I351 Nonrheumatic aortic (valve) insufficiency: Secondary | ICD-10-CM

## 2022-10-07 LAB — LIPID PANEL
Chol/HDL Ratio: 2.9 ratio (ref 0.0–4.4)
Cholesterol, Total: 110 mg/dL (ref 100–199)
HDL: 38 mg/dL — ABNORMAL LOW (ref >39)
LDL Chol Calc (NIH): 54 mg/dL (ref 0–99)
Triglycerides: 92 mg/dL (ref 0–149)
VLDL Cholesterol Cal: 18 mg/dL (ref 5–40)

## 2022-10-07 LAB — BASIC METABOLIC PANEL
BUN/Creatinine Ratio: 28 (ref 12–28)
BUN: 19 mg/dL (ref 8–27)
CO2: 24 mmol/L (ref 20–29)
Calcium: 10 mg/dL (ref 8.7–10.3)
Chloride: 104 mmol/L (ref 96–106)
Creatinine, Ser: 0.67 mg/dL (ref 0.57–1.00)
Glucose: 118 mg/dL — ABNORMAL HIGH (ref 70–99)
Potassium: 4.5 mmol/L (ref 3.5–5.2)
Sodium: 143 mmol/L (ref 134–144)
eGFR: 89 mL/min/{1.73_m2} (ref >59)

## 2022-10-21 ENCOUNTER — Other Ambulatory Visit: Payer: Self-pay | Admitting: Family Medicine

## 2022-10-23 NOTE — Telephone Encounter (Signed)
She is on Lisinopril 20 mg daily. BJ

## 2022-11-17 DIAGNOSIS — S0500XA Injury of conjunctiva and corneal abrasion without foreign body, unspecified eye, initial encounter: Secondary | ICD-10-CM | POA: Diagnosis not present

## 2022-12-01 ENCOUNTER — Other Ambulatory Visit: Payer: Self-pay

## 2022-12-01 DIAGNOSIS — I10 Essential (primary) hypertension: Secondary | ICD-10-CM

## 2022-12-01 MED ORDER — HYDROCHLOROTHIAZIDE 25 MG PO TABS: 25.0000 mg | ORAL_TABLET | Freq: Every day | ORAL | 2 refills | Status: DC

## 2022-12-06 ENCOUNTER — Other Ambulatory Visit: Payer: Self-pay

## 2022-12-06 DIAGNOSIS — I10 Essential (primary) hypertension: Secondary | ICD-10-CM

## 2022-12-06 MED ORDER — HYDROCHLOROTHIAZIDE 25 MG PO TABS: 25.0000 mg | ORAL_TABLET | Freq: Every day | ORAL | 2 refills | Status: DC

## 2022-12-06 MED ORDER — ROSUVASTATIN CALCIUM 20 MG PO TABS: 20.0000 mg | ORAL_TABLET | Freq: Every day | ORAL | 1 refills | Status: DC

## 2022-12-16 DIAGNOSIS — H5213 Myopia, bilateral: Secondary | ICD-10-CM | POA: Diagnosis not present

## 2022-12-20 ENCOUNTER — Other Ambulatory Visit: Payer: Self-pay

## 2022-12-20 MED ORDER — CARVEDILOL 12.5 MG PO TABS: 12.5000 mg | ORAL_TABLET | Freq: Two times a day (BID) | ORAL | 2 refills | Status: DC

## 2023-01-11 ENCOUNTER — Other Ambulatory Visit: Payer: Self-pay | Admitting: Family Medicine

## 2023-01-13 ENCOUNTER — Telehealth: Payer: Self-pay | Admitting: Cardiology

## 2023-01-13 DIAGNOSIS — I340 Nonrheumatic mitral (valve) insufficiency: Secondary | ICD-10-CM

## 2023-01-13 DIAGNOSIS — I10 Essential (primary) hypertension: Secondary | ICD-10-CM

## 2023-01-13 DIAGNOSIS — E7800 Pure hypercholesterolemia, unspecified: Secondary | ICD-10-CM

## 2023-01-13 DIAGNOSIS — I739 Peripheral vascular disease, unspecified: Secondary | ICD-10-CM

## 2023-01-13 DIAGNOSIS — R002 Palpitations: Secondary | ICD-10-CM

## 2023-01-13 DIAGNOSIS — I351 Nonrheumatic aortic (valve) insufficiency: Secondary | ICD-10-CM

## 2023-01-13 DIAGNOSIS — Z79899 Other long term (current) drug therapy: Secondary | ICD-10-CM

## 2023-01-13 MED ORDER — LISINOPRIL 20 MG PO TABS: 20.0000 mg | ORAL_TABLET | Freq: Two times a day (BID) | ORAL | 2 refills | Status: DC

## 2023-01-13 NOTE — Telephone Encounter (Signed)
*  STAT* If patient is at the pharmacy, call can be transferred to refill team.   1. Which medications need to be refilled? (please list name of each medication and dose if known) lisinopril (ZESTRIL) 20 MG tablet    2. Which pharmacy/location (including street and city if local pharmacy) is medication to be sent to?  [id number] - [place], [place] - [address]      3. Do they need a 30 day or 90 day supply? 90 day    Pt is completely out of medication

## 2023-01-13 NOTE — Telephone Encounter (Signed)
Pt's medication was sent to pt's pharmacy as requested. Confirmation received.  °

## 2023-02-11 ENCOUNTER — Encounter: Payer: Self-pay | Admitting: Cardiology

## 2023-02-28 ENCOUNTER — Ambulatory Visit (INDEPENDENT_AMBULATORY_CARE_PROVIDER_SITE_OTHER): Payer: Medicare Other

## 2023-02-28 VITALS — Ht 59.0 in | Wt 116.0 lb

## 2023-02-28 DIAGNOSIS — Z0000 Encounter for general adult medical examination without abnormal findings: Secondary | ICD-10-CM

## 2023-02-28 NOTE — Progress Notes (Signed)
Subjective:   Eileen Hansen is a 77 y.o. female who presents for Medicare Annual (Subsequent) preventive examination.  Visit Complete: Virtual  I connected with  Eileen Hansen on 02/28/23 by a audio enabled telemedicine application and verified that I am speaking with the correct person using two identifiers.  Patient Location: Home  Provider Location: Home Office  I discussed the limitations of evaluation and management by telemedicine. The patient expressed understanding and agreed to proceed.  Patient Medicare AWV questionnaire was completed by the patient on  ; I have confirmed that all information answered by patient is correct and no changes since this date.  Review of Systems    Vital Signs: Unable to obtain new vitals due to this being a telehealth visit.  Cardiac Risk Factors include: advanced age (>18men, >69 women);hypertension     Objective:    Today's Vitals   02/28/23 1106  Weight: 116 lb (52.6 kg)  Height: 4\' 11"  (1.499 m)   Body mass index is 23.43 kg/m.     02/28/2023   11:14 AM 02/24/2022    2:22 PM 09/14/2021   12:48 PM 02/13/2021    1:08 PM 02/13/2020    2:14 PM 11/07/2019    1:56 PM 10/17/2019    1:16 AM  Advanced Directives  Does Patient Have a Medical Advance Directive? Yes Yes Yes Yes Yes No No  Type of Estate agent of [place];Living will Healthcare Power of [place];Living will Healthcare Power of [place];Living will Healthcare Power of [place];Living will Healthcare Power of [place];Living will    Does patient want to make changes to medical advance directive?  No - Patient declined No - Patient declined  No - Patient declined    Copy of Healthcare Power of Attorney in Chart? No - copy requested No - copy requested  No - copy requested No - copy requested    Would patient like information on creating a medical advance directive?      No - Patient declined     Current Medications (verified) Outpatient Encounter Medications  as of 02/28/2023  Medication Sig   Ascorbic Acid (VITAMIN C) 1000 MG tablet Take 1,000 mg by mouth daily.   aspirin EC 81 MG tablet Take 81 mg by mouth daily. Swallow whole.   carvedilol (COREG) 12.5 MG tablet Take 1 tablet (12.5 mg total) by mouth 2 (two) times daily.   Cholecalciferol (VITAMIN D3) 25 MCG (1000 UT) CAPS Take 1 capsule by mouth daily.   glucosamine-chondroitin 500-400 MG tablet Take 1 tablet by mouth 3 (three) times daily. (Patient not taking: Reported on 02/28/2023)   hydrochlorothiazide (HYDRODIURIL) 25 MG tablet Take 1 tablet (25 mg total) by mouth daily.   lisinopril (ZESTRIL) 20 MG tablet Take 1 tablet (20 mg total) by mouth 2 (two) times daily.   Magnesium 200 MG TABS Take 1 tablet (200 mg total) by mouth at bedtime.   omega-3 acid ethyl esters (LOVAZA) 1 g capsule Take 1 capsule (1 g total) by mouth 2 (two) times daily.   rosuvastatin (CRESTOR) 20 MG tablet Take 1 tablet (20 mg total) by mouth daily.   vitamin B-12 (CYANOCOBALAMIN) 1000 MCG tablet Take 1,000 mcg by mouth daily.   No facility-administered encounter medications on file as of 02/28/2023.    Allergies (verified) Fenofibrate and Penicillins   History: Past Medical History:  Diagnosis Date   Allergic rhinitis    Arthralgia 09/30/2015   Arthritis    fingers   Bruises easily and  skin thin 09/11/2021   Chronic  bilateral shoulder pain left shoulder limited rom 12/07/2016   Heart murmur 12/07/2016   HTN (hypertension)    Lexiscan Myoview 04/2021    Myoview 10/22: EF 78, no ischemia or infarction; low risk   mild Aortic valve insufficiency    Mild Mitral valve regurgitation    PAD (peripheral artery disease) (HCC)    Pain in the wrist, unspecified laterality 03/03/2014   Personal history of COVID-19 04/2021   mild symptoms x 1 week all symptoms resolved   Prolapse of anterior vaginal wall    Tachycardia    Episodic   Upper back pain    Wears glasses for reading    Wears partial denture upper     Past Surgical History:  Procedure Laterality Date   ANTERIOR AND POSTERIOR REPAIR WITH SACROSPINOUS FIXATION N/A 09/14/2021   Procedure: ANTERIOR REPAIR WITH SACROSPINOUS FIXATION;  Surgeon: [name], MD;  Location: [facility];  Service: Gynecology;  Laterality: N/A;   APPENDECTOMY  77 yrs old   BUNIONECTOMY Bilateral    more than 40 yrs ago   colonscopy  2018   CYSTOSCOPY N/A 09/14/2021   Procedure: CYSTOSCOPY;  Surgeon: [name], MD;  Location: [facility];  Service: Gynecology;  Laterality: N/A;   WISDOM TOOTH EXTRACTION  77 yrs old   Family History  Problem Relation Age of Onset   Cancer Father        melanoma   Diabetes Mother    Hypertension Mother    Hyperlipidemia Mother    Stroke Mother    Heart attack Mother    Heart attack Brother    Cancer Brother        pancreatic   Colon cancer Neg Hx    Breast cancer Neg Hx    Coronary artery disease Neg Hx    Social History   Socioeconomic History   Marital status: Married    Spouse name: Not on file   Number of children: 1   Years of education: Not on file   Highest education level: Not on file  Occupational History   Occupation: Dentist: RETIRED  Tobacco Use   Smoking status: Never   Smokeless tobacco: Never  Vaping Use   Vaping status: Never Used  Substance and Sexual Activity   Alcohol use: Yes    Comment:  occ   Drug use: Never   Sexual activity: Not Currently    Comment: lives with husband  Other Topics Concern   Not on file  Social History Narrative   University - [place], Masters Degree - counseling, [employer] counseling   Married '69 - 7 years, divorced; married '82   21 daughter - '72; 2 grandchildren      Regular Exercise -  YES         Social Determinants of Health   Financial Resource Strain: Low Risk  (02/28/2023)   Overall Financial Resource Strain (CARDIA)    Difficulty of Paying Living Expenses: Not hard at all   Food Insecurity: No Food Insecurity (02/28/2023)   Hunger Vital Sign    Worried About Running Out of Food in the Last Year: Never true    Ran Out of Food in the Last Year: Never true  Transportation Needs: No Transportation Needs (02/28/2023)   PRAPARE - Administrator, Civil Service (Medical): No    Lack of Transportation (Non-Medical): No  Physical Activity: Inactive (02/28/2023)  Exercise Vital Sign    Days of Exercise per Week: 0 days    Minutes of Exercise per Session: 0 min  Stress: No Stress Concern Present (02/28/2023)   [employer] of Occupational Health - Occupational Stress Questionnaire    Feeling of Stress : Not at all  Social Connections: Moderately Isolated (02/28/2023)   Social Connection and Isolation Panel [NHANES]    Frequency of Communication with Friends and Family: More than three times a week    Frequency of Social Gatherings with Friends and Family: More than three times a week    Attends Religious Services: Never    Database administrator or Organizations: No    Attends Engineer, structural: Never    Marital Status: Married    Tobacco Counseling Counseling given: Not Answered   Clinical Intake:  Pre-visit preparation completed: No  Pain : No/denies pain     BMI - recorded: 23.43 Nutritional Status: BMI of 19-24  Normal Nutritional Risks: None Diabetes: No  How often do you need to have someone help you when you read instructions, pamphlets, or other written materials from your doctor or pharmacy?: 1 - Never  Interpreter Needed?: No  Information entered by :: Theresa Mulligan LPN   Activities of Daily Living    02/28/2023   11:13 AM  In your present state of health, do you have any difficulty performing the following activities:  Hearing? 0  Vision? 0  Difficulty concentrating or making decisions? 0  Walking or climbing stairs? 0  Dressing or bathing? 0  Doing errands, shopping? 0  Preparing Food and eating ? N   Using the Toilet? N  In the past six months, have you accidently leaked urine? N  Do you have problems with loss of bowel control? N  Managing your Medications? N  Managing your Finances? N  Housekeeping or managing your Housekeeping? N    Patient Care Team: [name], [name], MD as PCP - General (Family Medicine) Meriam Sprague, MD as PCP - Cardiology (Cardiology) Carrington Clamp, MD as Consulting Physician (Obstetrics and Gynecology) Rachael Fee, MD as Attending Physician (Gastroenterology) Carman Ching, OD (Optometry)  Indicate any recent Medical Services you may have received from other than [facility] providers in the past year (date may be approximate).     Assessment:   This is a routine wellness examination for Eileen Hansen.  Hearing/Vision screen Hearing Screening - Comments:: Denies hearing difficulties   Vision Screening - Comments:: Wears rx glasses - up to date with routine eye exams with  Dr Hyacinth Meeker  Dietary issues and exercise activities discussed:     Goals Addressed               This Visit's Progress     Stay alive! (pt-stated)         Depression Screen    02/28/2023   11:12 AM 08/18/2022    1:53 PM 07/21/2022    2:03 PM 02/24/2022    2:17 PM 02/16/2022    4:10 PM 02/16/2022    3:05 PM 11/06/2021   12:29 PM  PHQ 2/9 Scores  PHQ - 2 Score 0 0 0 0 0 0 2  PHQ- 9 Score  0 2 0 2  4    Fall Risk    02/28/2023   11:13 AM 08/18/2022   12:34 PM 07/21/2022    2:03 PM 02/24/2022    2:20 PM 02/16/2022    4:11 PM  Fall Risk   Falls  in the past year? 0 0 0 0 0  Number falls in past yr: 0 0 0 0 0  Injury with Fall? 0 0 0 0 0  Risk for fall due to : No Fall Risks Other (Comment) No Fall Risks No Fall Risks No Fall Risks  Follow up Falls prevention discussed Falls evaluation completed Falls evaluation completed  Falls evaluation completed    MEDICARE RISK AT HOME:  Medicare Risk at Home - 02/28/23 1122     Any stairs in or around the home? No    If so, are  there any without handrails? No    Home free of loose throw rugs in walkways, pet beds, electrical cords, etc? Yes    Adequate lighting in your home to reduce risk of falls? Yes    Life alert? No    Use of a cane, walker or w/c? No    Grab bars in the bathroom? No    Shower chair or bench in shower? No    Elevated toilet seat or a handicapped toilet? Yes             TIMED UP AND GO:  Was the test performed?  No    Cognitive Function:    09/02/2016    3:33 PM  MMSE - Mini Mental State Exam  Orientation to time 5  Orientation to Place 5  Registration 3  Attention/ Calculation 5  Recall 3  Language- name 2 objects 2  Language- repeat 1  Language- follow 3 step command 3  Language- read & follow direction 1  Write a sentence 1  Copy design 1  Total score 30        02/28/2023   11:14 AM 02/24/2022    2:24 PM 02/13/2020    2:20 PM  6CIT Screen  What Year? 0 points 0 points 0 points  What month? 0 points 0 points 0 points  What time? 0 points 0 points 0 points  Count back from 20 0 points 0 points 0 points  Months in reverse 0 points 0 points 0 points  Repeat phrase 0 points 0 points 0 points  Total Score 0 points 0 points 0 points    Immunizations Immunization History  Administered Date(s) Administered   Fluad Quad(high Dose 65+) 05/07/2019   Influenza Split 05/11/2012   Influenza Whole 05/31/2007, 05/14/2008, 05/29/2010   Influenza, High Dose Seasonal PF 04/08/2016, 04/11/2017   Influenza,inj,Quad PF,6+ Mos 04/19/2013, 04/05/2014, 04/14/2015   Influenza-Unspecified 05/19/2018   PFIZER(Purple Top)SARS-COV-2 Vaccination 08/07/2019, 08/27/2019, 07/01/2021, 05/28/2022   Pneumococcal Conjugate-13 02/26/2014   Pneumococcal Polysaccharide-23 03/29/2011   Tdap 03/29/2011    TDAP status: Due, Education has been provided regarding the importance of this vaccine. Advised may receive this vaccine at local pharmacy or Health Dept. Aware to provide a copy of the  vaccination record if obtained from local pharmacy or Health Dept. Verbalized acceptance and understanding.  Flu Vaccine status: Due, Education has been provided regarding the importance of this vaccine. Advised may receive this vaccine at local pharmacy or Health Dept. Aware to provide a copy of the vaccination record if obtained from local pharmacy or Health Dept. Verbalized acceptance and understanding.  Pneumococcal vaccine status: Up to date  Covid-19 vaccine status: Completed vaccines  Qualifies for Shingles Vaccine? Yes   Zostavax completed No   Shingrix Completed?: No.    Education has been provided regarding the importance of this vaccine. Patient has been advised to call insurance company to determine out of  pocket expense if they have not yet received this vaccine. Advised may also receive vaccine at local pharmacy or Health Dept. Verbalized acceptance and understanding.  Screening Tests Health Maintenance  Topic Date Due   Zoster Vaccines- Shingrix (1 of 2) Never done   DTaP/Tdap/Td (2 - Td or Tdap) 03/28/2021   COVID-19 Vaccine (5 - 2023-24 season) 07/23/2022   INFLUENZA VACCINE  02/17/2023   Medicare Annual Wellness (AWV)  02/28/2024   Pneumonia Vaccine 17+ Years old  Completed   DEXA SCAN  Completed   Hepatitis C Screening  Completed   HPV VACCINES  Aged Out    Health Maintenance  Health Maintenance Due  Topic Date Due   Zoster Vaccines- Shingrix (1 of 2) Never done   DTaP/Tdap/Td (2 - Td or Tdap) 03/28/2021   COVID-19 Vaccine (5 - 2023-24 season) 07/23/2022   INFLUENZA VACCINE  02/17/2023    Colorectal cancer screening: No longer required.   Mammogram status: No longer required due to Age.  Bone Density status: Completed 05/05/20. Results reflect: Bone density results: OSTEOPENIA. Repeat every   years.  Lung Cancer Screening: (Low Dose CT Chest recommended if Age 70-80 years, 20 pack-year currently smoking OR have quit w/in 15years.) does not qualify.      Additional Screening:  Hepatitis C Screening: does qualify; Completed    Vision Screening: Recommended annual ophthalmology exams for early detection of glaucoma and other disorders of the eye. Is the patient up to date with their annual eye exam?  Yes  Who is the provider or what is the name of the office in which the patient attends annual eye exams? Dr Hyacinth Meeker If pt is not established with a provider, would they like to be referred to a provider to establish care? No .   Dental Screening: Recommended annual dental exams for proper oral hygiene    Community Resource Referral / Chronic Care Management:  CRR required this visit?  No   CCM required this visit?  No     Plan:     I have personally reviewed and noted the following in the patient's chart:   Medical and social history Use of alcohol, tobacco or illicit drugs  Current medications and supplements including opioid prescriptions. Patient is not currently taking opioid prescriptions. Functional ability and status Nutritional status Physical activity Advanced directives List of other physicians Hospitalizations, surgeries, and ER visits in previous 12 months Vitals Screenings to include cognitive, depression, and falls Referrals and appointments  In addition, I have reviewed and discussed with patient certain preventive protocols, quality metrics, and best practice recommendations. A written personalized care plan for preventive services as well as general preventive health recommendations were provided to patient.     Tillie Rung, LPN   9/81/1914   After Visit Summary: (MyChart) Due to this being a telephonic visit, the after visit summary with patients personalized plan was offered to patient via MyChart   Nurse Notes: None

## 2023-02-28 NOTE — Patient Instructions (Addendum)
Eileen Hansen , Thank you for taking time to come for your Medicare Wellness Visit. I appreciate your ongoing commitment to your health goals. Please review the following plan we discussed and let me know if I can assist you in the future.   Referrals/Orders/Follow-Ups/Clinician Recommendations:   This is a list of the screening recommended for you and due dates:  Health Maintenance  Topic Date Due   Zoster (Shingles) Vaccine (1 of 2) Never done   DTaP/Tdap/Td vaccine (2 - Td or Tdap) 03/28/2021   COVID-19 Vaccine (5 - 2023-24 season) 07/23/2022   Flu Shot  02/17/2023   Medicare Annual Wellness Visit  02/28/2024   Pneumonia Vaccine  Completed   DEXA scan (bone density measurement)  Completed   Hepatitis C Screening  Completed   HPV Vaccine  Aged Out    Advanced directives: (Copy Requested) Please bring a copy of your health care power of attorney and living will to the office to be added to your chart at your convenience.  Next Medicare Annual Wellness Visit scheduled for next year: Yes  Preventive Care 65 Years and Older, Female Preventive care refers to lifestyle choices and visits with your health care provider that can promote health and wellness. What does preventive care include? A yearly physical exam. This is also called an annual well check. Dental exams once or twice a year. Routine eye exams. Ask your health care provider how often you should have your eyes checked. Personal lifestyle choices, including: Daily care of your teeth and gums. Regular physical activity. Eating a healthy diet. Avoiding tobacco and drug use. Limiting alcohol use. Practicing safe sex. Taking low-dose aspirin every day. Taking vitamin and mineral supplements as recommended by your health care provider. What happens during an annual well check? The services and screenings done by your health care provider during your annual well check will depend on your age, overall health, lifestyle risk factors,  and family history of disease. Counseling  Your health care provider may ask you questions about your: Alcohol use. Tobacco use. Drug use. Emotional well-being. Home and relationship well-being. Sexual activity. Eating habits. History of falls. Memory and ability to understand (cognition). Work and work Astronomer. Reproductive health. Screening  You may have the following tests or measurements: Height, weight, and BMI. Blood pressure. Lipid and cholesterol levels. These may be checked every 5 years, or more frequently if you are over 61 years old. Skin check. Lung cancer screening. You may have this screening every year starting at age 73 if you have a 30-pack-year history of smoking and currently smoke or have quit within the past 15 years. Fecal occult blood test (FOBT) of the stool. You may have this test every year starting at age 19. Flexible sigmoidoscopy or colonoscopy. You may have a sigmoidoscopy every 5 years or a colonoscopy every 10 years starting at age 8. Hepatitis C blood test. Hepatitis B blood test. Sexually transmitted disease (STD) testing. Diabetes screening. This is done by checking your blood sugar (glucose) after you have not eaten for a while (fasting). You may have this done every 1-3 years. Bone density scan. This is done to screen for osteoporosis. You may have this done starting at age 36. Mammogram. This may be done every 1-2 years. Talk to your health care provider about how often you should have regular mammograms. Talk with your health care provider about your test results, treatment options, and if necessary, the need for more tests. Vaccines  Your health care provider  may recommend certain vaccines, such as: Influenza vaccine. This is recommended every year. Tetanus, diphtheria, and acellular pertussis (Tdap, Td) vaccine. You may need a Td booster every 10 years. Zoster vaccine. You may need this after age 19. Pneumococcal 13-valent conjugate  (PCV13) vaccine. One dose is recommended after age 43. Pneumococcal polysaccharide (PPSV23) vaccine. One dose is recommended after age 29. Talk to your health care provider about which screenings and vaccines you need and how often you need them. This information is not intended to replace advice given to you by your health care provider. Make sure you discuss any questions you have with your health care provider. Document Released: 08/01/2015 Document Revised: 03/24/2016 Document Reviewed: 05/06/2015 Elsevier Interactive Patient Education  2017 [employer].  Fall Prevention in the Home Falls can cause injuries. They can happen to people of all ages. There are many things you can do to make your home safe and to help prevent falls. What can I do on the outside of my home? Regularly fix the edges of walkways and driveways and fix any cracks. Remove anything that might make you trip as you walk through a door, such as a raised step or threshold. Trim any bushes or trees on the path to your home. Use bright outdoor lighting. Clear any walking paths of anything that might make someone trip, such as rocks or tools. Regularly check to see if handrails are loose or broken. Make sure that both sides of any steps have handrails. Any raised decks and porches should have guardrails on the edges. Have any leaves, snow, or ice cleared regularly. Use sand or salt on walking paths during winter. Clean up any spills in your garage right away. This includes oil or grease spills. What can I do in the bathroom? Use night lights. Install grab bars by the toilet and in the tub and shower. Do not use towel bars as grab bars. Use non-skid mats or decals in the tub or shower. If you need to sit down in the shower, use a plastic, non-slip stool. Keep the floor dry. Clean up any water that spills on the floor as soon as it happens. Remove soap buildup in the tub or shower regularly. Attach bath mats securely with  double-sided non-slip rug tape. Do not have throw rugs and other things on the floor that can make you trip. What can I do in the bedroom? Use night lights. Make sure that you have a light by your bed that is easy to reach. Do not use any sheets or blankets that are too big for your bed. They should not hang down onto the floor. Have a firm chair that has side arms. You can use this for support while you get dressed. Do not have throw rugs and other things on the floor that can make you trip. What can I do in the kitchen? Clean up any spills right away. Avoid walking on wet floors. Keep items that you use a lot in easy-to-reach places. If you need to reach something above you, use a strong step stool that has a grab bar. Keep electrical cords out of the way. Do not use floor polish or wax that makes floors slippery. If you must use wax, use non-skid floor wax. Do not have throw rugs and other things on the floor that can make you trip. What can I do with my stairs? Do not leave any items on the stairs. Make sure that there are handrails on both sides  of the stairs and use them. Fix handrails that are broken or loose. Make sure that handrails are as long as the stairways. Check any carpeting to make sure that it is firmly attached to the stairs. Fix any carpet that is loose or worn. Avoid having throw rugs at the top or bottom of the stairs. If you do have throw rugs, attach them to the floor with carpet tape. Make sure that you have a light switch at the top of the stairs and the bottom of the stairs. If you do not have them, ask someone to add them for you. What else can I do to help prevent falls? Wear shoes that: Do not have high heels. Have rubber bottoms. Are comfortable and fit you well. Are closed at the toe. Do not wear sandals. If you use a stepladder: Make sure that it is fully opened. Do not climb a closed stepladder. Make sure that both sides of the stepladder are locked  into place. Ask someone to hold it for you, if possible. Clearly mark and make sure that you can see: Any grab bars or handrails. First and last steps. Where the edge of each step is. Use tools that help you move around (mobility aids) if they are needed. These include: Canes. Walkers. Scooters. Crutches. Turn on the lights when you go into a dark area. Replace any light bulbs as soon as they burn out. Set up your furniture so you have a clear path. Avoid moving your furniture around. If any of your floors are uneven, fix them. If there are any pets around you, be aware of where they are. Review your medicines with your doctor. Some medicines can make you feel dizzy. This can increase your chance of falling. Ask your doctor what other things that you can do to help prevent falls. This information is not intended to replace advice given to you by your health care provider. Make sure you discuss any questions you have with your health care provider. Document Released: 05/01/2009 Document Revised: 12/11/2015 Document Reviewed: 08/09/2014 Elsevier Interactive Patient Education  2017 [employer].

## 2023-03-03 DIAGNOSIS — I70213 Atherosclerosis of native arteries of extremities with intermittent claudication, bilateral legs: Secondary | ICD-10-CM | POA: Diagnosis not present

## 2023-03-08 ENCOUNTER — Ambulatory Visit: Payer: Medicare Other | Admitting: Cardiovascular Disease

## 2023-03-15 NOTE — Progress Notes (Signed)
ACUTE VISIT Chief Complaint  Patient presents with   Medication Reaction    Pain all over body - taking rosuvastatin    HPI: [name] is a 77 y.o. female, who is here today complaining of generalized body pain myalgias and arthralgias of upper and lower extremities.  No edema or erythema. Problem has been constant for the past 2-3 months.  She reports not taking any OTC analgesic for this.  Pain interferes with sleep sometimes. Lab Results  Component Value Date   ESRSEDRATE 3 08/03/2022   Lab Results  Component Value Date   CRP <1 08/03/2022   Lab Results  Component Value Date   TSH 3.060 08/03/2022   Hyperlipidemia on rosuvastatin 20 mg daily. Lab Results  Component Value Date   CHOL 110 10/06/2022   HDL 38 (L) 10/06/2022   LDLCALC 54 10/06/2022   LDLDIRECT 152.0 06/25/2019   TRIG 92 10/06/2022   CHOLHDL 2.9 10/06/2022   She stopped Rosuvastatin for 10 days and did not note improvement.  Shoulder limitation of ROM and pain with movement, getting worse. She has received shoulder injections from orthopedics in the past. Her condition has worsened since her last visit in January.   She also reports distal left leg numbness and heaviness for the past two months, with no associated back pain.  She has seen neurologist in the past for myalgia,numbness,and gait disturbance on 08/03/22.Blood work was ordered, including CRP,RF,ANA+ENA, SED rate, thyroid panel,CK<and urine acid.  HTN: Current medications: Carvedilol 12.5 mg bid , hydrochlorothiazide 25 mg daily, and Lisinopril 20 mg daily. She is concerned about the number of medications she is taking She is checking her BP at home and states it is well-controlled. Negative for severe/frequent headache, visual changes, chest pain, dyspnea, palpitation, claudication like symptoms, focal weakness, or edema.  PAD, she follows with vascular and cardiologist.  Lab Results  Component Value Date   VITAMINB12 501  11/06/2021   Her HgA1C has been mildly elevated before. 6.2 in 07/2022. Negative for polydipsia,polyuria, or polyphagia.  Review of Systems  Constitutional:  Negative for appetite change, chills and fever.  HENT:  Negative for mouth sores and sore throat.   Respiratory:  Negative for cough and wheezing.   Gastrointestinal:  Negative for abdominal pain, nausea and vomiting.  Endocrine: Negative for cold intolerance and heat intolerance.  Genitourinary:  Negative for decreased urine volume, dysuria and hematuria.  Skin:  Negative for rash.  Neurological:  Negative for syncope and facial asymmetry.  See other pertinent positives and negatives in HPI.  Current Outpatient Medications on File Prior to Visit  Medication Sig Dispense Refill   Ascorbic Acid (VITAMIN C) 1000 MG tablet Take 1,000 mg by mouth daily.     aspirin EC 81 MG tablet Take 81 mg by mouth daily. Swallow whole.     carvedilol (COREG) 12.5 MG tablet Take 1 tablet (12.5 mg total) by mouth 2 (two) times daily. 180 tablet 2   Cholecalciferol (VITAMIN D3) 25 MCG (1000 UT) CAPS Take 1 capsule by mouth daily.     glucosamine-chondroitin 500-400 MG tablet Take 1 tablet by mouth 3 (three) times daily.     hydrochlorothiazide (HYDRODIURIL) 25 MG tablet Take 1 tablet (25 mg total) by mouth daily. 90 tablet 2   lisinopril (ZESTRIL) 20 MG tablet Take 1 tablet (20 mg total) by mouth 2 (two) times daily. 180 tablet 2   Magnesium 200 MG TABS Take 1 tablet (200 mg total) by mouth at bedtime.  30 tablet 11   omega-3 acid ethyl esters (LOVAZA) 1 g capsule Take 1 capsule (1 g total) by mouth 2 (two) times daily. 180 capsule 2   rosuvastatin (CRESTOR) 20 MG tablet Take 1 tablet (20 mg total) by mouth daily. 90 tablet 1   vitamin B-12 (CYANOCOBALAMIN) 1000 MCG tablet Take 1,000 mcg by mouth daily.     No current facility-administered medications on file prior to visit.    Past Medical History:  Diagnosis Date   Allergic rhinitis     Arthralgia 09/30/2015   Arthritis    fingers   Bruises easily and skin thin 09/11/2021   Chronic  bilateral shoulder pain left shoulder limited rom 12/07/2016   Heart murmur 12/07/2016   HTN (hypertension)    Lexiscan Myoview 04/2021    Myoview 10/22: EF 78, no ischemia or infarction; low risk   mild Aortic valve insufficiency    Mild Mitral valve regurgitation    PAD (peripheral artery disease) (HCC)    Pain in the wrist, unspecified laterality 03/03/2014   Personal history of COVID-19 04/2021   mild symptoms x 1 week all symptoms resolved   Prolapse of anterior vaginal wall    Tachycardia    Episodic   Upper back pain    Wears glasses for reading    Wears partial denture upper    Allergies  Allergen Reactions   Fenofibrate Other (See Comments)    Abdominal pain   Penicillins Rash    REACTION: Hives Anaphylaxis    Social History   Socioeconomic History   Marital status: Married    Spouse name: Not on file   Number of children: 1   Years of education: Not on file   Highest education level: Not on file  Occupational History   Occupation: Homemaker    Employer: RETIRED  Tobacco Use   Smoking status: Never   Smokeless tobacco: Never  Vaping Use   Vaping status: Never Used  Substance and Sexual Activity   Alcohol use: Yes    Comment:  occ   Drug use: Never   Sexual activity: Not Currently    Comment: lives with husband  Other Topics Concern   Not on file  Social History Narrative   University - [place], Masters Degree - counseling, [employer]   Married '69 - 7 years, divorced; married '82   21 daughter - '72; 2 grandchildren      Regular Exercise -  YES         Social Determinants of Health   Financial Resource Strain: Low Risk  (02/28/2023)   Overall Financial Resource Strain (CARDIA)    Difficulty of Paying Living Expenses: Not hard at all  Food Insecurity: No Food Insecurity (02/28/2023)   Hunger Vital Sign    Worried About Running Out  of Food in the Last Year: Never true    Ran Out of Food in the Last Year: Never true  Transportation Needs: No Transportation Needs (02/28/2023)   PRAPARE - Administrator, Civil Service (Medical): No    Lack of Transportation (Non-Medical): No  Physical Activity: Inactive (02/28/2023)   Exercise Vital Sign    Days of Exercise per Week: 0 days    Minutes of Exercise per Session: 0 min  Stress: No Stress Concern Present (02/28/2023)   [employer] of Occupational Health - Occupational Stress Questionnaire    Feeling of Stress : Not at all  Social Connections: Moderately Isolated (02/28/2023)   Social Connection  and Isolation Panel [NHANES]    Frequency of Communication with Friends and Family: More than three times a week    Frequency of Social Gatherings with Friends and Family: More than three times a week    Attends Religious Services: Never    Database administrator or Organizations: No    Attends Banker Meetings: Never    Marital Status: Married    Vitals:   03/16/23 1025  BP: 120/70  Pulse: 60  Temp: 98.1 F (36.7 C)  SpO2: 98%   Body mass index is 23.71 kg/m.  Physical Exam Vitals and nursing note reviewed.  Constitutional:      General: She is not in acute distress.    Appearance: She is well-developed.  HENT:     Head: Normocephalic and atraumatic.     Mouth/Throat:     Mouth: Mucous membranes are moist.     Pharynx: Oropharynx is clear.  Eyes:     Conjunctiva/sclera: Conjunctivae normal.  Cardiovascular:     Rate and Rhythm: Normal rate and regular rhythm.     Pulses:          Dorsalis pedis pulses are 2+ on the right side and 2+ on the left side.     Heart sounds: Murmur (RUSB soft SEM) heard.  Pulmonary:     Effort: Pulmonary effort is normal. No respiratory distress.     Breath sounds: Normal breath sounds.  Abdominal:     Palpations: Abdomen is soft. There is no hepatomegaly or mass.     Tenderness: There is no abdominal  tenderness.  Musculoskeletal:     Right shoulder: Tenderness present. Decreased range of motion.     Left shoulder: Tenderness present. Decreased range of motion.     Comments: Some trigger tender points left upper and lower extremities and back.  Lymphadenopathy:     Cervical: No cervical adenopathy.  Skin:    General: Skin is warm.     Findings: No erythema or rash.  Neurological:     General: No focal deficit present.     Mental Status: She is alert and oriented to person, place, and time.     Cranial Nerves: No cranial nerve deficit.     Gait: Gait normal.  Psychiatric:        Mood and Affect: Mood and affect normal.   ASSESSMENT AND PLAN:  Eileen Hansen was seen today for myalgias, arthralgias,and follow-up. Lab Results  Component Value Date   VITAMINB12 1,146 (H) 03/16/2023   Lab Results  Component Value Date   NA 140 03/16/2023   CL 103 03/16/2023   K 4.1 03/16/2023   CO2 30 03/16/2023   BUN 20 03/16/2023   CREATININE 0.67 03/16/2023   GFR 83.36 03/16/2023   CALCIUM 10.1 03/16/2023   ALBUMIN 4.4 03/16/2023   GLUCOSE 116 (H) 03/16/2023   Lab Results  Component Value Date   ALT 15 03/16/2023   AST 22 03/16/2023   ALKPHOS 52 03/16/2023   BILITOT 0.7 03/16/2023   Lab Results  Component Value Date   HGBA1C 6.1 03/16/2023   Polyarthralgia Rheumatologic work up in 07/2022 negative. Most likely OA. If needed she can take Tylenol 500 mg 3-4 times per day.  Myalgia Assessment & Plan: This seems to be a chronic problem. We discussed possible etiologies, i?  Medications side effects, fibromyalgia. She has seen neurologist for this problem. CK was 28 (low) in 07/2022. She is going to hold rosuvastatin for 3 to 4  weeks and monitor for changes.  Hyperlipidemia, mixed Assessment & Plan: Last LDL 54 in 09/2022. We discussed benefits of statins given her history of PAD. Currently she is on rosuvastatin, which could be causing or aggravating myalgias and arthralgias.   Instructed to hold on rosuvastatin for 3 to 4 weeks, if determined that statin is causing symptoms, we need to consider other options.  Orders: -     Comprehensive metabolic panel; Future  Prediabetes HgA1C 6.2 in 07/2022. Continue a healthy lifestyle for diabetes prevention. Further recommendation will be given according to hemoglobin A1c result.  -     Hemoglobin A1c; Future  Numbness She has been evaluated by neurologist for paresthesias, workup included blood work and cervical imaging. Continue following with neurologist, Dr Epimenio Foot.  -     Vitamin B12; Future  Bilateral shoulder pain, unspecified chronicity Chronic and getting worse. She has received injection from ortho. PT referral placed to help with improving ROM.  -     Ambulatory referral to Physical Therapy  Essential hypertension Assessment & Plan: BP adequately controlled. We discussed possible complications of elevated BP, so recommend continuing carvedilol 12.5 mg twice daily, lisinopril 10 mg daily,and HCTZ 25 mg daily. Continue monitoring BP at home as well as following low-salt diet.  Return in about 6 months (around 09/16/2023) for chronic problems.  Abagale Boulos G. [name], MD  [facility]. [place] office.

## 2023-03-16 ENCOUNTER — Ambulatory Visit: Payer: Medicare Other | Admitting: Family Medicine

## 2023-03-16 ENCOUNTER — Encounter: Payer: Self-pay | Admitting: Family Medicine

## 2023-03-16 VITALS — BP 120/70 | HR 60 | Temp 98.1°F | Ht 59.0 in | Wt 117.4 lb

## 2023-03-16 DIAGNOSIS — M2550 Pain in unspecified joint: Secondary | ICD-10-CM | POA: Diagnosis not present

## 2023-03-16 DIAGNOSIS — I10 Essential (primary) hypertension: Secondary | ICD-10-CM

## 2023-03-16 DIAGNOSIS — E782 Mixed hyperlipidemia: Secondary | ICD-10-CM | POA: Diagnosis not present

## 2023-03-16 DIAGNOSIS — R7303 Prediabetes: Secondary | ICD-10-CM

## 2023-03-16 DIAGNOSIS — M25512 Pain in left shoulder: Secondary | ICD-10-CM

## 2023-03-16 DIAGNOSIS — M791 Myalgia, unspecified site: Secondary | ICD-10-CM

## 2023-03-16 DIAGNOSIS — R200 Anesthesia of skin: Secondary | ICD-10-CM | POA: Diagnosis not present

## 2023-03-16 DIAGNOSIS — M25511 Pain in right shoulder: Secondary | ICD-10-CM

## 2023-03-16 LAB — COMPREHENSIVE METABOLIC PANEL
ALT: 15 U/L (ref 0–35)
AST: 22 U/L (ref 0–37)
Albumin: 4.4 g/dL (ref 3.5–5.2)
Alkaline Phosphatase: 52 U/L (ref 39–117)
BUN: 20 mg/dL (ref 6–23)
CO2: 30 mEq/L (ref 19–32)
Calcium: 10.1 mg/dL (ref 8.4–10.5)
Chloride: 103 mEq/L (ref 96–112)
Creatinine, Ser: 0.67 mg/dL (ref 0.40–1.20)
GFR: 83.36 mL/min (ref >60.00)
Glucose, Bld: 116 mg/dL — ABNORMAL HIGH (ref 70–99)
Potassium: 4.1 mEq/L (ref 3.5–5.1)
Sodium: 140 mEq/L (ref 135–145)
Total Bilirubin: 0.7 mg/dL (ref 0.2–1.2)
Total Protein: 7.3 g/dL (ref 6.0–8.3)

## 2023-03-16 LAB — HEMOGLOBIN A1C: Hgb A1c MFr Bld: 6.1 % (ref 4.6–6.5)

## 2023-03-16 LAB — VITAMIN B12: Vitamin B-12: 1146 pg/mL — ABNORMAL HIGH (ref 211–911)

## 2023-03-16 NOTE — Patient Instructions (Addendum)
A few things to remember from today's visit:  Polyarthralgia  Myalgia  Hyperlipidemia, mixed - Plan: Comprehensive metabolic panel  Prediabetes - Plan: Hemoglobin A1c  Numbness - Plan: Vitamin B12  Bilateral shoulder pain, unspecified chronicity - Plan: Ambulatory referral to Physical Therapy  Hold rosuvastatin for 3-4 weeks and monitor for changes in pain. Arrange appt with neurologist.  If you need refills for medications you take chronically, please call your pharmacy. Do not use My Chart to request refills or for acute issues that need immediate attention. If you send a my chart message, it may take a few days to be addressed, specially if I am not in the office.  Please be sure medication list is accurate. If a new problem present, please set up appointment sooner than planned today.

## 2023-03-19 NOTE — Assessment & Plan Note (Signed)
Last LDL 54 in 09/2022. We discussed benefits of statins given her history of PAD. Currently she is on rosuvastatin, which could be causing or aggravating myalgias and arthralgias.  Instructed to hold on rosuvastatin for 3 to 4 weeks, if determined that statin is causing symptoms, we need to consider other options.

## 2023-03-19 NOTE — Assessment & Plan Note (Signed)
BP adequately controlled. We discussed possible complications of elevated BP, so recommend continuing carvedilol 12.5 mg twice daily, lisinopril 10 mg daily,and HCTZ 25 mg daily. Continue monitoring BP at home as well as following low-salt diet.

## 2023-03-19 NOTE — Assessment & Plan Note (Addendum)
This seems to be a chronic problem. We discussed possible etiologies, i?  Medications side effects, fibromyalgia. She has seen neurologist for this problem. CK was 28 (low) in 07/2022. She is going to hold rosuvastatin for 3 to 4 weeks and monitor for changes.

## 2023-03-29 ENCOUNTER — Ambulatory Visit: Payer: Medicare Other | Attending: Cardiovascular Disease | Admitting: Cardiovascular Disease

## 2023-03-29 ENCOUNTER — Encounter: Payer: Self-pay | Admitting: Cardiovascular Disease

## 2023-03-29 VITALS — BP 168/80 | HR 63 | Ht 59.0 in | Wt 117.0 lb

## 2023-03-29 DIAGNOSIS — E785 Hyperlipidemia, unspecified: Secondary | ICD-10-CM | POA: Diagnosis not present

## 2023-03-29 DIAGNOSIS — I739 Peripheral vascular disease, unspecified: Secondary | ICD-10-CM | POA: Diagnosis not present

## 2023-03-29 DIAGNOSIS — G609 Hereditary and idiopathic neuropathy, unspecified: Secondary | ICD-10-CM

## 2023-03-29 DIAGNOSIS — R002 Palpitations: Secondary | ICD-10-CM

## 2023-03-29 DIAGNOSIS — I10 Essential (primary) hypertension: Secondary | ICD-10-CM

## 2023-03-29 MED ORDER — ATORVASTATIN CALCIUM 10 MG PO TABS: 10.0000 mg | ORAL_TABLET | Freq: Every day | ORAL | 3 refills | Status: DC

## 2023-03-29 NOTE — Patient Instructions (Signed)
Medication Instructions:  START Atorvastatin (Lipitor) 10 mg once daily  *If you need a refill on your cardiac medications before your next appointment, please call your pharmacy*   Lab Work: Your provider would like for you to return in 2 months to have the following labs drawn: fasting lipid and liver. You do not need an appointment for the lab. Once in our office lobby there is a podium where you can sign in and ring the doorbell to alert [place] that you are here. The lab is open from 8:00 am to 4 pm; closed for lunch from 12:45pm-1:45pm.  You may also go to any of these [facility] locations:   [facility] - [address] ([facility]) - [address] [phone] [address]   [place] - [address]    [place]  - [address]    [place] - [address] - [address] [employer]  - [address] [place] - [address] Chief Technology Officer)  If you have labs (blood work) drawn today and your tests are completely normal, you will receive your results only by: [employer] (if you have MyChart) OR A paper copy in the mail If you have any lab test that is abnormal or we need to change your treatment, we will call you to review the results.   Testing/Procedures: None ordered   Follow-Up: At [facility], you and your health needs are our priority.  As part of our continuing mission to provide you with exceptional heart care, we have created designated Provider Care Teams.  These Care Teams include your primary Cardiologist (physician) and Advanced Practice Providers (APPs -  Physician Assistants and Nurse Practitioners) who all work together to provide you with the care you need, when you need it.  We recommend signing up for the patient portal called "MyChart".  Sign up information is provided on this After Visit Summary.  MyChart is used to connect with patients  for Virtual Visits (Telemedicine).  Patients are able to view lab/test results, encounter notes, upcoming appointments, etc.  Non-urgent messages can be sent to your provider as well.   To learn more about what you can do with MyChart, go to [email or web address].    Your next appointment:   6 month(s)  Dr. Kirke Corin

## 2023-03-29 NOTE — Progress Notes (Unsigned)
Cardiology Office Note   Date:  03/30/2023   ID:  Eileen Hansen, Eileen Hansen [birth year], MRN [id number]  PCP:  [name], [name], MD  Cardiologist: Dr. Shari Prows  No chief complaint on file.      History of Present Illness: Eileen Hansen is a 77 y.o. female who is here today for follow-up visit regarding peripheral arterial disease.   She has known history of mild mitral and aortic regurgitation, essential hypertension, diet-controlled diabetes and hyperlipidemia.  Carotid Doppler in September of 2022 showed mild nonobstructive disease. She was seen few months ago for bilateral calf claudication worse on the right side. Lower extremity arterial Doppler showed an ABI of 0.81 on the right and 0.91 on the left.  Duplex showed significant stenosis in the right mid SFA and moderate disease affecting the left SFA.  She had repeat Doppler studies last month which showed an ABI of 0.94 on the right and 0.78 on the left.  Duplex showed moderate SFA disease bilaterally.  She continues to complain of numbness in both feet and also discomfort in the medial side of the left knee with radiation down to the foot.  The symptoms sometimes wake her up from sleep and she also has numbness in both hands.  She does complain of mild left calf discomfort with walking. She stopped rosuvastatin due to generalized myalgia.  Her symptoms resolved since then.  Past Medical History:  Diagnosis Date   Allergic rhinitis    Arthralgia 09/30/2015   Arthritis    fingers   Bruises easily and skin thin 09/11/2021   Chronic  bilateral shoulder pain left shoulder limited rom 12/07/2016   Heart murmur 12/07/2016   HTN (hypertension)    Lexiscan Myoview 04/2021    Myoview 10/22: EF 78, no ischemia or infarction; low risk   mild Aortic valve insufficiency    Mild Mitral valve regurgitation    PAD (peripheral artery disease) (HCC)    Pain in the wrist, unspecified laterality 03/03/2014   Personal history of COVID-19 04/2021    mild symptoms x 1 week all symptoms resolved   Prolapse of anterior vaginal wall    Tachycardia    Episodic   Upper back pain    Wears glasses for reading    Wears partial denture upper     Past Surgical History:  Procedure Laterality Date   ANTERIOR AND POSTERIOR REPAIR WITH SACROSPINOUS FIXATION N/A 09/14/2021   Procedure: ANTERIOR REPAIR WITH SACROSPINOUS FIXATION;  Surgeon: [name], MD;  Location: [facility];  Service: Gynecology;  Laterality: N/A;   APPENDECTOMY  77 yrs old   BUNIONECTOMY Bilateral    more than 40 yrs ago   colonscopy  2018   CYSTOSCOPY N/A 09/14/2021   Procedure: CYSTOSCOPY;  Surgeon: [name], MD;  Location: [facility];  Service: Gynecology;  Laterality: N/A;   WISDOM TOOTH EXTRACTION  77 yrs old     Current Outpatient Medications  Medication Sig Dispense Refill   Ascorbic Acid (VITAMIN C) 1000 MG tablet Take 1,000 mg by mouth daily.     aspirin EC 81 MG tablet Take 81 mg by mouth daily. Swallow whole.     atorvastatin (LIPITOR) 10 MG tablet Take 1 tablet (10 mg total) by mouth daily. 90 tablet 3   carvedilol (COREG) 12.5 MG tablet Take 1 tablet (12.5 mg total) by mouth 2 (two) times daily. 180 tablet 2   Cholecalciferol (VITAMIN D3) 25 MCG (1000 UT) CAPS  Take 1 capsule by mouth daily.     glucosamine-chondroitin 500-400 MG tablet Take 1 tablet by mouth 3 (three) times daily.     hydrochlorothiazide (HYDRODIURIL) 25 MG tablet Take 1 tablet (25 mg total) by mouth daily. 90 tablet 2   lisinopril (ZESTRIL) 20 MG tablet Take 1 tablet (20 mg total) by mouth 2 (two) times daily. 180 tablet 2   Magnesium 200 MG TABS Take 1 tablet (200 mg total) by mouth at bedtime. 30 tablet 11   omega-3 acid ethyl esters (LOVAZA) 1 g capsule Take 1 capsule (1 g total) by mouth 2 (two) times daily. 180 capsule 2   vitamin B-12 (CYANOCOBALAMIN) 1000 MCG tablet Take 1,000 mcg by mouth daily.     No current  facility-administered medications for this visit.    Allergies:   Fenofibrate and Penicillins    Social History:  The patient  reports that she has never smoked. She has never used smokeless tobacco. She reports current alcohol use. She reports that she does not use drugs.   Family History:  The patient's family history includes Cancer in her brother and father; Diabetes in her mother; Heart attack in her brother and mother; Hyperlipidemia in her mother; Hypertension in her mother; Stroke in her mother.    ROS:  Please see the history of present illness.   Otherwise, review of systems are positive for none.   All other systems are reviewed and negative.    PHYSICAL EXAM: VS:  BP (!) 168/80 (BP Location: Right Arm, Patient Position: Sitting, Cuff Size: Normal)   Pulse 63   Ht 4\' 11"  (1.499 m)   Wt 117 lb (53.1 kg)   SpO2 96%   BMI 23.63 kg/m  , BMI Body mass index is 23.63 kg/m. GEN: Well nourished, well developed, in no acute distress  HEENT: normal  Neck: no JVD, carotid bruits, or masses Cardiac: RRR; no murmurs, rubs, or gallops,no edema  Respiratory:  clear to auscultation bilaterally, normal work of breathing GI: soft, nontender, nondistended, + BS MS: no deformity or atrophy  Skin: warm and dry, no rash Neuro:  Strength and sensation are intact Psych: euthymic mood, full affect Vascular: Radial pulse:normal bilaterally.  Femoral: Normal bilaterally.  Dorsalis pedis and posterior tibial are +1 bilaterally.  EKG:  EKG ordered today. EKG showed : Normal sinus rhythm Possible Anterior infarct , age undetermined When compared with ECG of 16-Oct-2019 19:34, Questionable change in QRS axis    Recent Labs: 08/03/2022: TSH 3.060 03/16/2023: ALT 15; BUN 20; Creatinine, Ser 0.67; Potassium 4.1; Sodium 140    Lipid Panel    Component Value Date/Time   CHOL 110 10/06/2022 1039   TRIG 92 10/06/2022 1039   HDL 38 (L) 10/06/2022 1039   CHOLHDL 2.9 10/06/2022 1039   CHOLHDL  4 02/18/2021 1005   VLDL 26.4 02/18/2021 1005   LDLCALC 54 10/06/2022 1039   LDLCALC 64 03/26/2020 1259   LDLDIRECT 152.0 06/25/2019 1536      Wt Readings from Last 3 Encounters:  03/29/23 117 lb (53.1 kg)  03/16/23 117 lb 6 oz (53.2 kg)  02/28/23 116 lb (52.6 kg)          No data to display            ASSESSMENT AND PLAN:  1.  Peripheral arterial disease with intermittent claudication: She continues to have palpable distal pulses.  She does have mild left calf claudication but not severe enough to require revascularization.  I still  think that the majority of her symptoms are related to peripheral neuropathy.  Consider treatment with gabapentin.    2.  Essential hypertension: Blood pressure is elevated here but reasonably controlled at home.  Her blood pressure readings are usually around 140/70.  Continue carvedilol, hydrochlorothiazide and lisinopril.  3.  Hyperlipidemia: she stop taking rosuvastatin due to generalized myalgia.  Will try atorvastatin 10 mg daily.  4.  Peripheral neuropathy: Consider treatment with gabapentin or Lyrica.    Disposition:   FU with me in 6 months  Signed,  Lorine Bears, MD  03/30/2023 1:56 PM    [facility]

## 2023-04-07 DIAGNOSIS — K080 Exfoliation of teeth due to systemic causes: Secondary | ICD-10-CM | POA: Diagnosis not present

## 2023-04-12 DIAGNOSIS — M272 Inflammatory conditions of jaws: Secondary | ICD-10-CM | POA: Diagnosis not present

## 2023-04-12 DIAGNOSIS — K080 Exfoliation of teeth due to systemic causes: Secondary | ICD-10-CM | POA: Diagnosis not present

## 2023-04-13 ENCOUNTER — Ambulatory Visit: Payer: BC Managed Care – PPO | Admitting: Cardiology

## 2023-04-25 ENCOUNTER — Other Ambulatory Visit: Payer: Self-pay

## 2023-04-25 ENCOUNTER — Encounter (HOSPITAL_BASED_OUTPATIENT_CLINIC_OR_DEPARTMENT_OTHER): Payer: Self-pay | Admitting: Physical Therapy

## 2023-04-25 ENCOUNTER — Ambulatory Visit (HOSPITAL_BASED_OUTPATIENT_CLINIC_OR_DEPARTMENT_OTHER): Payer: Medicare Other | Attending: Family Medicine | Admitting: Physical Therapy

## 2023-04-25 DIAGNOSIS — M25511 Pain in right shoulder: Secondary | ICD-10-CM | POA: Insufficient documentation

## 2023-04-25 DIAGNOSIS — M25512 Pain in left shoulder: Secondary | ICD-10-CM | POA: Insufficient documentation

## 2023-04-25 DIAGNOSIS — M6281 Muscle weakness (generalized): Secondary | ICD-10-CM | POA: Insufficient documentation

## 2023-04-25 NOTE — Therapy (Unsigned)
OUTPATIENT PHYSICAL THERAPY UPPER EXTREMITY EVALUATION   Patient Name: Eileen Hansen MRN: [id number] DOB:1946/04/17, 77 y.o., female Today's Date: 04/26/2023  END OF SESSION:  PT End of Session - 04/25/23 1302     Visit Number 1    Number of Visits 12    Date for PT Re-Evaluation 06/24/23    PT Start Time 1205    PT Stop Time 1245    PT Time Calculation (min) 40 min    Activity Tolerance Patient tolerated treatment well    Behavior During Therapy [facility] for tasks assessed/performed             Past Medical History:  Diagnosis Date   Allergic rhinitis    Arthralgia 09/30/2015   Arthritis    fingers   Bruises easily and skin thin 09/11/2021   Chronic  bilateral shoulder pain left shoulder limited rom 12/07/2016   Heart murmur 12/07/2016   HTN (hypertension)    Lexiscan Myoview 04/2021    Myoview 10/22: EF 78, no ischemia or infarction; low risk   mild Aortic valve insufficiency    Mild Mitral valve regurgitation    PAD (peripheral artery disease) (HCC)    Pain in the wrist, unspecified laterality 03/03/2014   Personal history of COVID-19 04/2021   mild symptoms x 1 week all symptoms resolved   Prolapse of anterior vaginal wall    Tachycardia    Episodic   Upper back pain    Wears glasses for reading    Wears partial denture upper    Past Surgical History:  Procedure Laterality Date   ANTERIOR AND POSTERIOR REPAIR WITH SACROSPINOUS FIXATION N/A 09/14/2021   Procedure: ANTERIOR REPAIR WITH SACROSPINOUS FIXATION;  Surgeon: [name], MD;  Location: [facility];  Service: Gynecology;  Laterality: N/A;   APPENDECTOMY  77 yrs old   BUNIONECTOMY Bilateral    more than 40 yrs ago   colonscopy  2018   CYSTOSCOPY N/A 09/14/2021   Procedure: CYSTOSCOPY;  Surgeon: [name], MD;  Location: [facility];  Service: Gynecology;  Laterality: N/A;   WISDOM TOOTH EXTRACTION  77 yrs old   Patient Active Problem List   Diagnosis  Date Noted   PAD (peripheral artery disease) (HCC) 08/18/2022   Myalgia 08/18/2022   Uterovaginal prolapse, incomplete 09/14/2021   Right calf pain 03/24/2021   Primary osteoarthritis of right knee 12/24/2019   Sciatica, right side 10/29/2019   RUQ pain 07/20/2018   Influenza A 07/18/2018   Senile ecchymosis 02/03/2018   Atypical chest pain 04/17/2017   Bronchitis, acute 01/09/2017   Heart murmur 12/07/2016   Chronic shoulder pain 12/07/2016   Epistaxis 09/30/2015   Tinnitus of right ear 09/30/2015   Left arm pain 09/30/2015   Left hip pain 09/30/2015   Generalized osteoarthritis of multiple sites 09/30/2015   Medicare annual wellness visit, subsequent 04/14/2015   Globus sensation 02/23/2015   Paresthesia 11/08/2014   Osteopenia 04/15/2014   Prediabetes 04/07/2014   Pain in the wrist, unspecified laterality 03/03/2014   Paresthesia of left arm 11/16/2013   Lateral epicondylitis of left elbow 04/19/2013   Hematochezia 04/19/2013   DDD (degenerative disc disease), cervical 05/16/2012   Preventative health care 03/31/2011   JOINT PAIN, HAND 07/23/2010   DISTURBANCE OF SKIN SENSATION 07/04/2009   Hyperlipidemia, mixed 07/04/2009   Constipation 12/02/2008   TENESMUS 12/02/2008   Tachycardia 11/07/2007   Essential hypertension 06/01/2007   ALLERGIC RHINITIS 06/01/2007    PCP: [name], Betty  Reece Agar, MD  REFERRING PROVIDER: [name], [name], MD   REFERRING DIAG: Bilateral shoulder pain, unspecified chronicity   THERAPY DIAG:  Bilateral shoulder pain, unspecified chronicity  Muscle weakness (generalized)  Rationale for Evaluation and Treatment: Rehabilitation  ONSET DATE: 7 yrs  SUBJECTIVE:                                                                                                                                                                                      SUBJECTIVE STATEMENT: Shoulders have been hurting for a long time. She has not had any formal assessment of  shoulder from MD since  ~ 2017.  MD mentioned possible surgery and I didn't go back. Left shoulder worse than right. Unable reaching over head, pain is constant.  Pain wakes me at night.  No pain in neck despite other issues going on. She does report some numbness in her right hand at night Hand dominance: Right  PERTINENT HISTORY: As per md: She has received shoulder injections from orthopedics in the past. Her condition has worsened since her last visit in January. (PCP)  PAIN:  Are you having pain? Yes: NPRS scale: Right 3/10 left 5/10 Pain location: bilat shoulders Pain description: constant ache Aggravating factors: reaching over head, any shoulder movement Relieving factors: resting not moving  PRECAUTIONS: None  RED FLAGS: None   WEIGHT BEARING RESTRICTIONS: No  FALLS:  Has patient fallen in last 6 months? No  LIVING ENVIRONMENT: Lives with: lives with their family and lives with their spouse Lives in: House/apartment   OCCUPATION: Retired school teachers  PLOF: Independent  PATIENT GOALS: Be able to sleep without pain when I move.  NEXT MD VISIT: as needed  OBJECTIVE:  Note: Objective measures were completed at Evaluation unless otherwise noted.  DIAGNOSTIC FINDINGS:  Feb 2024 IMPRESSION: This MRI of the cervical spine without contrast shows the following: The spinal cord appears normal. At C3-C4, there is central disc protrusion and mild uncovertebral spurring causing mild spinal stenosis but no nerve root compression.  The degenerative changes have progressed compared to the 2013 MRI. At C4-C5, there are stable degenerative changes causing mild spinal stenosis and moderate left foraminal narrowing encroaching on the left C5 nerve root without causing definite compression. At C5-C6 there are degenerative changes but no spinal stenosis or nerve root compression. At C6-C7, degenerative changes cause moderate right foraminal narrowing encroaching upon the right C6  nerve root without causing definite nerve root compression.  The degenerative changes have progressed compared to 2013.  PATIENT SURVEYS :  FOTO Primary score 43% with goal of 59%  COGNITION: Overall cognitive status: Within functional limits for tasks assessed  SENSATION: WFL  POSTURE: Forward head and rounded shoulders  UPPER EXTREMITY ROM:   Active ROM Right eval Left eval  Shoulder flexion 109 105  Shoulder extension 41 24  Shoulder abduction 83 69  Shoulder adduction    Shoulder internal rotation s1 Mid glut  Shoulder external rotation c7 c7  Elbow flexion    Elbow extension    Wrist flexion    Wrist extension    Wrist ulnar deviation    Wrist radial deviation    Wrist pronation    Wrist supination    (Blank rows = not tested)  UPPER EXTREMITY MMT:  MMT Right eval Left eval  Shoulder flexion 3/5 in available range 3 limited to pain  Shoulder extension    Shoulder abduction 3- limited to pain 3- limited to pain  Shoulder adduction    Shoulder internal rotation    Shoulder external rotation    Middle trapezius 3 3  Lower trapezius    Elbow flexion 4P! 4P!  Elbow extension    Wrist flexion    Wrist extension    Wrist ulnar deviation    Wrist radial deviation    Wrist pronation    Wrist supination    Grip strength (lbs)    (Blank rows = not tested)  SHOULDER SPECIAL TESTS: Rotator cuff assessment: Empty can test: positive    PALPATION:  TTP   TODAY'S TREATMENT:                                                                                                                                         Evaluation  Exercises - Seated Scapular Retraction   - Seated Shoulder Shrugs - Seated Shoulder Abduction Towel Slide at Table Top   PATIENT EDUCATION: Education details: Discussed eval findings, rehab rationale, aquatic program progression/POC and pools in area. Patient is in agreement  Person educated: Patient Education method:  Explanation Education comprehension: verbalized understanding  HOME EXERCISE PROGRAM: Access Code: [email or web address] URL: [email or web address] Date: 04/26/2023 Prepared by: Geni Bers  Exercises - Seated Scapular Retraction  - 1 x daily - 7 x weekly - 1 sets - 10 reps - Seated Shoulder Shrugs  - 1 x daily - 7 x weekly - 3 sets - 10 reps - Seated Shoulder Abduction Towel Slide at Table Top  - 1 x daily - 7 x weekly - 2 sets - 10 reps  ASSESSMENT:  CLINICAL IMPRESSION: Patient is a 77 y.o. f who was seen today for physical therapy evaluation and treatment for shoulder pain. She reports many years of discomfort receiving PT for in past without any improvement.  She has had steroid injections but did not return to ortho because she was not ready for any surgical procedure.  She presents today with limited bilateral ROM, pain and strength deficits throughout shoulders.  She has tenderness with palpation bilaterally about subacromial bursa and rotator cuff insertion.  Pain is constant and interrupts sleep. She will benefit from skilled physical therapy to improve ue movement and strength allowing for decreased difficulty completing ADL's and decreasing pain.   OBJECTIVE IMPAIRMENTS: decreased coordination, decreased ROM, decreased strength, impaired UE functional use, and pain.   ACTIVITY LIMITATIONS: carrying, lifting, bathing, dressing, and reach over head  PARTICIPATION LIMITATIONS: meal prep, cleaning, and laundry  PERSONAL FACTORS: Age and Time since onset of injury/illness/exacerbation are also affecting patient's functional outcome.   REHAB POTENTIAL: Good  CLINICAL DECISION MAKING: Stable/uncomplicated  EVALUATION COMPLEXITY: Low  GOALS: Goals reviewed with patient? Yes  SHORT TERM GOALS: Target date: 05/27/23  Pt will be indep and consistent with HEP to demonstrate commitment to improvement  Baseline: Goal status: INITIAL  2.  Pt will report decreased pain with  ADL's/combing hair Baseline:  Goal status: INITIAL  3.  Pt will report a decrease in shoulder pain by 2 NPRS to improve comfort Baseline:   LONG TERM GOALS: Target date: 06/24/23  Pt to meet stated Foto Goal of 59% Baseline: 43% Goal status: INITIAL  2.  Pt will improve shoulder flex and abd ROM by 20D Baseline: see chart Goal status: INITIAL  3.  Pt will improve shoulder and scapular strength by at least 1 grade Baseline: see chart Goal status: INITIAL  4.  Pt will report ability to reach up into cabinet with less discomfort Baseline:  Goal status: INITIAL  5.  Pt will report reduced incidence of dropping objects/improved hand strength Baseline:  Goal status: INITIAL    PLAN: PT FREQUENCY: 1-2x/week  PT DURATION: 8 weeks12 visits.  Extended to allow for scheduling conflicts  PLANNED INTERVENTIONS: Therapeutic exercises, Therapeutic activity, Neuromuscular re-education, Balance training, Gait training, Patient/Family education, Self Care, Joint mobilization, DME instructions, Aquatic Therapy, Dry Needling, Cryotherapy, Moist heat, Taping, Ionotophoresis 4mg /ml Dexamethasone, Manual therapy, and Re-evaluation  PLAN FOR NEXT SESSION: Land based: ROM and strengthening bilat ue shoulder complex. Consider TB; DN   Geni Bers, PT 04/26/2023, 6:44 AM

## 2023-05-05 ENCOUNTER — Ambulatory Visit (HOSPITAL_BASED_OUTPATIENT_CLINIC_OR_DEPARTMENT_OTHER): Payer: Medicare Other

## 2023-05-05 ENCOUNTER — Encounter (HOSPITAL_BASED_OUTPATIENT_CLINIC_OR_DEPARTMENT_OTHER): Payer: Self-pay

## 2023-05-05 DIAGNOSIS — M6281 Muscle weakness (generalized): Secondary | ICD-10-CM

## 2023-05-05 DIAGNOSIS — M25511 Pain in right shoulder: Secondary | ICD-10-CM

## 2023-05-05 DIAGNOSIS — M25512 Pain in left shoulder: Secondary | ICD-10-CM | POA: Diagnosis not present

## 2023-05-05 NOTE — Therapy (Signed)
OUTPATIENT PHYSICAL THERAPY UPPER EXTREMITY TREATMENT   Patient Name: Eileen Hansen MRN: [id number] DOB:Dec 17, 1945, 77 y.o., female Today's Date: 05/05/2023  END OF SESSION:  PT End of Session - 05/05/23 1220     Visit Number 2    Number of Visits 12    Date for PT Re-Evaluation 06/24/23    PT Start Time 1148    PT Stop Time 1230    PT Time Calculation (min) 42 min    Activity Tolerance Patient tolerated treatment well    Behavior During Therapy [facility] for tasks assessed/performed              Past Medical History:  Diagnosis Date   Allergic rhinitis    Arthralgia 09/30/2015   Arthritis    fingers   Bruises easily and skin thin 09/11/2021   Chronic  bilateral shoulder pain left shoulder limited rom 12/07/2016   Heart murmur 12/07/2016   HTN (hypertension)    Lexiscan Myoview 04/2021    Myoview 10/22: EF 78, no ischemia or infarction; low risk   mild Aortic valve insufficiency    Mild Mitral valve regurgitation    PAD (peripheral artery disease) (HCC)    Pain in the wrist, unspecified laterality 03/03/2014   Personal history of COVID-19 04/2021   mild symptoms x 1 week all symptoms resolved   Prolapse of anterior vaginal wall    Tachycardia    Episodic   Upper back pain    Wears glasses for reading    Wears partial denture upper    Past Surgical History:  Procedure Laterality Date   ANTERIOR AND POSTERIOR REPAIR WITH SACROSPINOUS FIXATION N/A 09/14/2021   Procedure: ANTERIOR REPAIR WITH SACROSPINOUS FIXATION;  Surgeon: [name], MD;  Location: [facility];  Service: Gynecology;  Laterality: N/A;   APPENDECTOMY  77 yrs old   BUNIONECTOMY Bilateral    more than 40 yrs ago   colonscopy  2018   CYSTOSCOPY N/A 09/14/2021   Procedure: CYSTOSCOPY;  Surgeon: [name], MD;  Location: [facility]  Service: Gynecology;  Laterality: N/A;   WISDOM TOOTH EXTRACTION  77 yrs old   Patient Active Problem List    Diagnosis Date Noted   PAD (peripheral artery disease) (HCC) 08/18/2022   Myalgia 08/18/2022   Uterovaginal prolapse, incomplete 09/14/2021   Right calf pain 03/24/2021   Primary osteoarthritis of right knee 12/24/2019   Sciatica, right side 10/29/2019   RUQ pain 07/20/2018   Influenza A 07/18/2018   Senile ecchymosis 02/03/2018   Atypical chest pain 04/17/2017   Bronchitis, acute 01/09/2017   Heart murmur 12/07/2016   Chronic shoulder pain 12/07/2016   Epistaxis 09/30/2015   Tinnitus of right ear 09/30/2015   Left arm pain 09/30/2015   Left hip pain 09/30/2015   Generalized osteoarthritis of multiple sites 09/30/2015   Medicare annual wellness visit, subsequent 04/14/2015   Globus sensation 02/23/2015   Paresthesia 11/08/2014   Osteopenia 04/15/2014   Prediabetes 04/07/2014   Pain in the wrist, unspecified laterality 03/03/2014   Paresthesia of left arm 11/16/2013   Lateral epicondylitis of left elbow 04/19/2013   Hematochezia 04/19/2013   DDD (degenerative disc disease), cervical 05/16/2012   Preventative health care 03/31/2011   JOINT PAIN, HAND 07/23/2010   DISTURBANCE OF SKIN SENSATION 07/04/2009   Hyperlipidemia, mixed 07/04/2009   Constipation 12/02/2008   TENESMUS 12/02/2008   Tachycardia 11/07/2007   Essential hypertension 06/01/2007   ALLERGIC RHINITIS 06/01/2007    PCP: [name],  Timoteo Expose, MD  REFERRING PROVIDER: [name], [name], MD   REFERRING DIAG: Bilateral shoulder pain, unspecified chronicity   THERAPY DIAG:  Bilateral shoulder pain, unspecified chronicity  Muscle weakness (generalized)  Rationale for Evaluation and Treatment: Rehabilitation  ONSET DATE: 7 yrs  SUBJECTIVE:                                                                                                                                                                                      SUBJECTIVE STATEMENT: Pt reports the exercises are giong well. "I think since my pain has been  here for so long, it will take a long time to go away." No pain at rest, pain with reaching. Hand dominance: Right  PERTINENT HISTORY: As per md: She has received shoulder injections from orthopedics in the past. Her condition has worsened since her last visit in January. (PCP)  PAIN:  Are you having pain? Yes: NPRS scale: Right 5/10 left 7/10 Pain location: bilat shoulders Pain description: constant ache Aggravating factors: reaching over head, any shoulder movement Relieving factors: resting not moving  PRECAUTIONS: None  RED FLAGS: None   WEIGHT BEARING RESTRICTIONS: No  FALLS:  Has patient fallen in last 6 months? No  LIVING ENVIRONMENT: Lives with: lives with their family and lives with their spouse Lives in: House/apartment   OCCUPATION: Retired school teachers  PLOF: Independent  PATIENT GOALS: Be able to sleep without pain when I move.  NEXT MD VISIT: as needed  OBJECTIVE:  Note: Objective measures were completed at Evaluation unless otherwise noted.  DIAGNOSTIC FINDINGS:  Feb 2024 IMPRESSION: This MRI of the cervical spine without contrast shows the following: The spinal cord appears normal. At C3-C4, there is central disc protrusion and mild uncovertebral spurring causing mild spinal stenosis but no nerve root compression.  The degenerative changes have progressed compared to the 2013 MRI. At C4-C5, there are stable degenerative changes causing mild spinal stenosis and moderate left foraminal narrowing encroaching on the left C5 nerve root without causing definite compression. At C5-C6 there are degenerative changes but no spinal stenosis or nerve root compression. At C6-C7, degenerative changes cause moderate right foraminal narrowing encroaching upon the right C6 nerve root without causing definite nerve root compression.  The degenerative changes have progressed compared to 2013.  PATIENT SURVEYS :  FOTO Primary score 43% with goal of  59%  COGNITION: Overall cognitive status: Within functional limits for tasks assessed     SENSATION: WFL  POSTURE: Forward head and rounded shoulders  UPPER EXTREMITY ROM:   Active ROM Right eval Left eval  Shoulder flexion 109 105  Shoulder extension 41 24  Shoulder abduction  83 69  Shoulder adduction    Shoulder internal rotation s1 Mid glut  Shoulder external rotation c7 c7  Elbow flexion    Elbow extension    Wrist flexion    Wrist extension    Wrist ulnar deviation    Wrist radial deviation    Wrist pronation    Wrist supination    (Blank rows = not tested)  UPPER EXTREMITY MMT:  MMT Right eval Left eval  Shoulder flexion 3/5 in available range 3 limited to pain  Shoulder extension    Shoulder abduction 3- limited to pain 3- limited to pain  Shoulder adduction    Shoulder internal rotation    Shoulder external rotation    Middle trapezius 3 3  Lower trapezius    Elbow flexion 4P! 4P!  Elbow extension    Wrist flexion    Wrist extension    Wrist ulnar deviation    Wrist radial deviation    Wrist pronation    Wrist supination    Grip strength (lbs)    (Blank rows = not tested)  SHOULDER SPECIAL TESTS: Rotator cuff assessment: Empty can test: positive    PALPATION:  TTP   TODAY'S TREATMENT:                                                                                                                                         10/17  -PROM bil each plane -Gentle GHJ distraction bil -STM to bil pec, posterior shoulder, and biceps - Seated Scapular Retraction   - Seated Shoulder Shrugs x15 -supine cane chest press x10   PATIENT EDUCATION: Education details: Discussed eval findings, rehab rationale, aquatic program progression/POC and pools in area. Patient is in agreement  Person educated: Patient Education method: Explanation Education comprehension: verbalized understanding  HOME EXERCISE PROGRAM: Access Code: [email or web address] URL:  [email or web address] Date: 04/26/2023 Prepared by: Geni Bers  Exercises - Seated Scapular Retraction  - 1 x daily - 7 x weekly - 1 sets - 10 reps - Seated Shoulder Shrugs  - 1 x daily - 7 x weekly - 3 sets - 10 reps - Seated Shoulder Abduction Towel Slide at Table Top  - 1 x daily - 7 x weekly - 2 sets - 10 reps  ASSESSMENT:  CLINICAL IMPRESSION: Pt with tenderness and muscular restrictions throughout bilateral pecs, UT, posterior shoulder, and biceps. With PROM, she demonstrates guarding and requires extensive cuing to help decrease this. Crepitus present with passive flexion and abd bilaterally. In L shoulder, pt is limited to minimal ER and IR. Slightly more ER available on R shoulder, but IR painful. Will continue to improve mobility and strength in bil shoulders while avoiding pain.   Eval: Patient is a 77 y.o. f who was seen today for physical therapy evaluation and treatment for shoulder pain. She reports many years of discomfort receiving PT for in past without any improvement.  She has had steroid injections but did not return to ortho because she was not ready for any surgical procedure.  She presents today with limited bilateral ROM, pain and strength deficits throughout shoulders.  She has tenderness with palpation bilaterally about subacromial bursa and rotator cuff insertion.  Pain is constant and interrupts sleep. She will benefit from skilled physical therapy to improve ue movement and strength allowing for decreased difficulty completing ADL's and decreasing pain.   OBJECTIVE IMPAIRMENTS: decreased coordination, decreased ROM, decreased strength, impaired UE functional use, and pain.   ACTIVITY LIMITATIONS: carrying, lifting, bathing, dressing, and reach over head  PARTICIPATION LIMITATIONS: meal prep, cleaning, and laundry  PERSONAL FACTORS: Age and Time since onset of injury/illness/exacerbation are also affecting patient's functional outcome.   REHAB  POTENTIAL: Good  CLINICAL DECISION MAKING: Stable/uncomplicated  EVALUATION COMPLEXITY: Low  GOALS: Goals reviewed with patient? Yes  SHORT TERM GOALS: Target date: 05/27/23  Pt will be indep and consistent with HEP to demonstrate commitment to improvement  Baseline: Goal status: INITIAL  2.  Pt will report decreased pain with ADL's/combing hair Baseline:  Goal status: INITIAL  3.  Pt will report a decrease in shoulder pain by 2 NPRS to improve comfort Baseline:   LONG TERM GOALS: Target date: 06/24/23  Pt to meet stated Foto Goal of 59% Baseline: 43% Goal status: INITIAL  2.  Pt will improve shoulder flex and abd ROM by 20D Baseline: see chart Goal status: INITIAL  3.  Pt will improve shoulder and scapular strength by at least 1 grade Baseline: see chart Goal status: INITIAL  4.  Pt will report ability to reach up into cabinet with less discomfort Baseline:  Goal status: INITIAL  5.  Pt will report reduced incidence of dropping objects/improved hand strength Baseline:  Goal status: INITIAL    PLAN: PT FREQUENCY: 1-2x/week  PT DURATION: 8 weeks12 visits.  Extended to allow for scheduling conflicts  PLANNED INTERVENTIONS: Therapeutic exercises, Therapeutic activity, Neuromuscular re-education, Balance training, Gait training, Patient/Family education, Self Care, Joint mobilization, DME instructions, Aquatic Therapy, Dry Needling, Cryotherapy, Moist heat, Taping, Ionotophoresis 4mg /ml Dexamethasone, Manual therapy, and Re-evaluation  PLAN FOR NEXT SESSION: Land based: ROM and strengthening bilat ue shoulder complex. Consider TB; DN   Donnel Saxon Holt [place], PTA 05/05/2023, 2:04 PM

## 2023-05-09 ENCOUNTER — Ambulatory Visit (HOSPITAL_BASED_OUTPATIENT_CLINIC_OR_DEPARTMENT_OTHER): Payer: Medicare Other

## 2023-05-09 ENCOUNTER — Encounter (HOSPITAL_BASED_OUTPATIENT_CLINIC_OR_DEPARTMENT_OTHER): Payer: Self-pay

## 2023-05-09 DIAGNOSIS — M25512 Pain in left shoulder: Secondary | ICD-10-CM | POA: Diagnosis not present

## 2023-05-09 DIAGNOSIS — M25511 Pain in right shoulder: Secondary | ICD-10-CM

## 2023-05-09 DIAGNOSIS — M6281 Muscle weakness (generalized): Secondary | ICD-10-CM

## 2023-05-09 NOTE — Therapy (Signed)
OUTPATIENT PHYSICAL THERAPY UPPER EXTREMITY TREATMENT   Patient Name: Eileen Hansen MRN: [id number] DOB:Aug 29, 1945, 77 y.o., female Today's Date: 05/09/2023  END OF SESSION:  PT End of Session - 05/09/23 1538     Visit Number 3    Number of Visits 12    Date for PT Re-Evaluation 06/24/23    PT Start Time 1436    PT Stop Time 1516    PT Time Calculation (min) 40 min    Activity Tolerance Patient tolerated treatment well    Behavior During Therapy [facility] for tasks assessed/performed               Past Medical History:  Diagnosis Date   Allergic rhinitis    Arthralgia 09/30/2015   Arthritis    fingers   Bruises easily and skin thin 09/11/2021   Chronic  bilateral shoulder pain left shoulder limited rom 12/07/2016   Heart murmur 12/07/2016   HTN (hypertension)    Lexiscan Myoview 04/2021    Myoview 10/22: EF 78, no ischemia or infarction; low risk   mild Aortic valve insufficiency    Mild Mitral valve regurgitation    PAD (peripheral artery disease) (HCC)    Pain in the wrist, unspecified laterality 03/03/2014   Personal history of COVID-19 04/2021   mild symptoms x 1 week all symptoms resolved   Prolapse of anterior vaginal wall    Tachycardia    Episodic   Upper back pain    Wears glasses for reading    Wears partial denture upper    Past Surgical History:  Procedure Laterality Date   ANTERIOR AND POSTERIOR REPAIR WITH SACROSPINOUS FIXATION N/A 09/14/2021   Procedure: ANTERIOR REPAIR WITH SACROSPINOUS FIXATION;  Surgeon: [name], MD;  Location: [facility];  Service: Gynecology;  Laterality: N/A;   APPENDECTOMY  77 yrs old   BUNIONECTOMY Bilateral    more than 40 yrs ago   colonscopy  2018   CYSTOSCOPY N/A 09/14/2021   Procedure: CYSTOSCOPY;  Surgeon: [name], MD;  Location: [facility];  Service: Gynecology;  Laterality: N/A;   WISDOM TOOTH EXTRACTION  77 yrs old   Patient Active Problem List    Diagnosis Date Noted   PAD (peripheral artery disease) (HCC) 08/18/2022   Myalgia 08/18/2022   Uterovaginal prolapse, incomplete 09/14/2021   Right calf pain 03/24/2021   Primary osteoarthritis of right knee 12/24/2019   Sciatica, right side 10/29/2019   RUQ pain 07/20/2018   Influenza A 07/18/2018   Senile ecchymosis 02/03/2018   Atypical chest pain 04/17/2017   Bronchitis, acute 01/09/2017   Heart murmur 12/07/2016   Chronic shoulder pain 12/07/2016   Epistaxis 09/30/2015   Tinnitus of right ear 09/30/2015   Left arm pain 09/30/2015   Left hip pain 09/30/2015   Generalized osteoarthritis of multiple sites 09/30/2015   Medicare annual wellness visit, subsequent 04/14/2015   Globus sensation 02/23/2015   Paresthesia 11/08/2014   Osteopenia 04/15/2014   Prediabetes 04/07/2014   Pain in the wrist, unspecified laterality 03/03/2014   Paresthesia of left arm 11/16/2013   Lateral epicondylitis of left elbow 04/19/2013   Hematochezia 04/19/2013   DDD (degenerative disc disease), cervical 05/16/2012   Preventative health care 03/31/2011   JOINT PAIN, HAND 07/23/2010   DISTURBANCE OF SKIN SENSATION 07/04/2009   Hyperlipidemia, mixed 07/04/2009   Constipation 12/02/2008   TENESMUS 12/02/2008   Tachycardia 11/07/2007   Essential hypertension 06/01/2007   ALLERGIC RHINITIS 06/01/2007    PCP:  [name], [name], MD  REFERRING PROVIDER: [name], [name], MD   REFERRING DIAG: Bilateral shoulder pain, unspecified chronicity   THERAPY DIAG:  Bilateral shoulder pain, unspecified chronicity  Muscle weakness (generalized)  Rationale for Evaluation and Treatment: Rehabilitation  ONSET DATE: 7 yrs  SUBJECTIVE:                                                                                                                                                                                      SUBJECTIVE STATEMENT: Pt reports improved pain level since last visit, though still has some pain  with reaching movements.  Hand dominance: Right  PERTINENT HISTORY: As per md: She has received shoulder injections from orthopedics in the past. Her condition has worsened since her last visit in January. (PCP)  PAIN:  Are you having pain? Yes: NPRS scale: Right 5/10 left 7/10 Pain location: bilat shoulders Pain description: constant ache Aggravating factors: reaching over head, any shoulder movement Relieving factors: resting not moving  PRECAUTIONS: None  RED FLAGS: None   WEIGHT BEARING RESTRICTIONS: No  FALLS:  Has patient fallen in last 6 months? No  LIVING ENVIRONMENT: Lives with: lives with their family and lives with their spouse Lives in: House/apartment   OCCUPATION: Retired school teachers  PLOF: Independent  PATIENT GOALS: Be able to sleep without pain when I move.  NEXT MD VISIT: as needed  OBJECTIVE:  Note: Objective measures were completed at Evaluation unless otherwise noted.  DIAGNOSTIC FINDINGS:  Feb 2024 IMPRESSION: This MRI of the cervical spine without contrast shows the following: The spinal cord appears normal. At C3-C4, there is central disc protrusion and mild uncovertebral spurring causing mild spinal stenosis but no nerve root compression.  The degenerative changes have progressed compared to the 2013 MRI. At C4-C5, there are stable degenerative changes causing mild spinal stenosis and moderate left foraminal narrowing encroaching on the left C5 nerve root without causing definite compression. At C5-C6 there are degenerative changes but no spinal stenosis or nerve root compression. At C6-C7, degenerative changes cause moderate right foraminal narrowing encroaching upon the right C6 nerve root without causing definite nerve root compression.  The degenerative changes have progressed compared to 2013.  PATIENT SURVEYS :  FOTO Primary score 43% with goal of 59%  COGNITION: Overall cognitive status: Within functional limits for tasks  assessed     SENSATION: WFL  POSTURE: Forward head and rounded shoulders  UPPER EXTREMITY ROM:   Active ROM Right eval Left eval  Shoulder flexion 109 105  Shoulder extension 41 24  Shoulder abduction 83 69  Shoulder adduction    Shoulder internal rotation s1 Mid glut  Shoulder  external rotation c7 c7  Elbow flexion    Elbow extension    Wrist flexion    Wrist extension    Wrist ulnar deviation    Wrist radial deviation    Wrist pronation    Wrist supination    (Blank rows = not tested)  UPPER EXTREMITY MMT:  MMT Right eval Left eval  Shoulder flexion 3/5 in available range 3 limited to pain  Shoulder extension    Shoulder abduction 3- limited to pain 3- limited to pain  Shoulder adduction    Shoulder internal rotation    Shoulder external rotation    Middle trapezius 3 3  Lower trapezius    Elbow flexion 4P! 4P!  Elbow extension    Wrist flexion    Wrist extension    Wrist ulnar deviation    Wrist radial deviation    Wrist pronation    Wrist supination    Grip strength (lbs)    (Blank rows = not tested)  SHOULDER SPECIAL TESTS: Rotator cuff assessment: Empty can test: positive    PALPATION:  TTP   TODAY'S TREATMENT:                                                                                                                                           10/21  -PROM bil each plane -Gentle GHJ distraction bil -STM to bil pec, posterior shoulder, and biceps - Seated Scapular Retraction  3" 2x10 - Seated Shoulder Shrugs x15 -supine cane chest press 2x10 -Shoulder flexion iso 5" x10ea -Shoulder ER iso 5" x10ea  10/17  -PROM bil each plane -Gentle GHJ distraction bil -STM to bil pec, posterior shoulder, and biceps - Seated Scapular Retraction   - Seated Shoulder Shrugs x15 -supine cane chest press x10   PATIENT EDUCATION: Education details: Discussed eval findings, rehab rationale, aquatic program progression/POC and pools in area.  Patient is in agreement  Person educated: Patient Education method: Explanation Education comprehension: verbalized understanding  HOME EXERCISE PROGRAM: Access Code: [email or web address] URL: [email or web address] Date: 04/26/2023 Prepared by: Geni Bers  Exercises - Seated Scapular Retraction  - 1 x daily - 7 x weekly - 1 sets - 10 reps - Seated Shoulder Shrugs  - 1 x daily - 7 x weekly - 3 sets - 10 reps - Seated Shoulder Abduction Towel Slide at Table Top  - 1 x daily - 7 x weekly - 2 sets - 10 reps  ASSESSMENT:  CLINICAL IMPRESSION: Pt continues with severe crepitus in L >R shoulder. Unable to tolerate passive IR on R. Passive rotation on L results in severe crepitus. Did not push these movements. Trialed isometric flexion and ER bilaterally today. She did feel crepitus with L ER isometric. Will continue to progress as tolerated.   Eval: Patient is a 77 y.o. f who was seen today for physical therapy evaluation and treatment for shoulder pain. She  reports many years of discomfort receiving PT for in past without any improvement.  She has had steroid injections but did not return to ortho because she was not ready for any surgical procedure.  She presents today with limited bilateral ROM, pain and strength deficits throughout shoulders.  She has tenderness with palpation bilaterally about subacromial bursa and rotator cuff insertion.  Pain is constant and interrupts sleep. She will benefit from skilled physical therapy to improve ue movement and strength allowing for decreased difficulty completing ADL's and decreasing pain.   OBJECTIVE IMPAIRMENTS: decreased coordination, decreased ROM, decreased strength, impaired UE functional use, and pain.   ACTIVITY LIMITATIONS: carrying, lifting, bathing, dressing, and reach over head  PARTICIPATION LIMITATIONS: meal prep, cleaning, and laundry  PERSONAL FACTORS: Age and Time since onset of injury/illness/exacerbation are also affecting  patient's functional outcome.   REHAB POTENTIAL: Good  CLINICAL DECISION MAKING: Stable/uncomplicated  EVALUATION COMPLEXITY: Low  GOALS: Goals reviewed with patient? Yes  SHORT TERM GOALS: Target date: 05/27/23  Pt will be indep and consistent with HEP to demonstrate commitment to improvement  Baseline: Goal status: INITIAL  2.  Pt will report decreased pain with ADL's/combing hair Baseline:  Goal status: INITIAL  3.  Pt will report a decrease in shoulder pain by 2 NPRS to improve comfort Baseline:   LONG TERM GOALS: Target date: 06/24/23  Pt to meet stated Foto Goal of 59% Baseline: 43% Goal status: INITIAL  2.  Pt will improve shoulder flex and abd ROM by 20D Baseline: see chart Goal status: INITIAL  3.  Pt will improve shoulder and scapular strength by at least 1 grade Baseline: see chart Goal status: INITIAL  4.  Pt will report ability to reach up into cabinet with less discomfort Baseline:  Goal status: INITIAL  5.  Pt will report reduced incidence of dropping objects/improved hand strength Baseline:  Goal status: INITIAL    PLAN: PT FREQUENCY: 1-2x/week  PT DURATION: 8 weeks12 visits.  Extended to allow for scheduling conflicts  PLANNED INTERVENTIONS: Therapeutic exercises, Therapeutic activity, Neuromuscular re-education, Balance training, Gait training, Patient/Family education, Self Care, Joint mobilization, DME instructions, Aquatic Therapy, Dry Needling, Cryotherapy, Moist heat, Taping, Ionotophoresis 4mg /ml Dexamethasone, Manual therapy, and Re-evaluation  PLAN FOR NEXT SESSION: Land based: ROM and strengthening bilat ue shoulder complex. Consider TB; DN   Donnel Saxon Faydra Korman, PTA 05/09/2023, 4:12 PM

## 2023-05-14 ENCOUNTER — Encounter (HOSPITAL_BASED_OUTPATIENT_CLINIC_OR_DEPARTMENT_OTHER): Payer: Self-pay

## 2023-05-14 ENCOUNTER — Ambulatory Visit (HOSPITAL_BASED_OUTPATIENT_CLINIC_OR_DEPARTMENT_OTHER): Payer: Medicare Other

## 2023-05-14 DIAGNOSIS — M6281 Muscle weakness (generalized): Secondary | ICD-10-CM | POA: Diagnosis not present

## 2023-05-14 DIAGNOSIS — M25512 Pain in left shoulder: Secondary | ICD-10-CM | POA: Diagnosis not present

## 2023-05-14 DIAGNOSIS — M25511 Pain in right shoulder: Secondary | ICD-10-CM | POA: Diagnosis not present

## 2023-05-14 NOTE — Therapy (Signed)
OUTPATIENT PHYSICAL THERAPY UPPER EXTREMITY TREATMENT   Patient Name: Eileen Hansen MRN: [id number] DOB:[birth year], 77 y.o., female Today's Date: 05/14/2023  END OF SESSION:  PT End of Session - 05/14/23 1137     Visit Number 4    Number of Visits 12    Date for PT Re-Evaluation 06/24/23    PT Start Time 1135    PT Stop Time 1215    PT Time Calculation (min) 40 min    Activity Tolerance Patient tolerated treatment well    Behavior During Therapy [facility] for tasks assessed/performed                Past Medical History:  Diagnosis Date   Allergic rhinitis    Arthralgia 09/30/2015   Arthritis    fingers   Bruises easily and skin thin 09/11/2021   Chronic  bilateral shoulder pain left shoulder limited rom 12/07/2016   Heart murmur 12/07/2016   HTN (hypertension)    Lexiscan Myoview 04/2021    Myoview 10/22: EF 78, no ischemia or infarction; low risk   mild Aortic valve insufficiency    Mild Mitral valve regurgitation    PAD (peripheral artery disease) (HCC)    Pain in the wrist, unspecified laterality 03/03/2014   Personal history of COVID-19 04/2021   mild symptoms x 1 week all symptoms resolved   Prolapse of anterior vaginal wall    Tachycardia    Episodic   Upper back pain    Wears glasses for reading    Wears partial denture upper    Past Surgical History:  Procedure Laterality Date   ANTERIOR AND POSTERIOR REPAIR WITH SACROSPINOUS FIXATION N/A 09/14/2021   Procedure: ANTERIOR REPAIR WITH SACROSPINOUS FIXATION;  Surgeon: [name], MD;  Location: [facility];  Service: Gynecology;  Laterality: N/A;   APPENDECTOMY  77 yrs old   BUNIONECTOMY Bilateral    more than 40 yrs ago   colonscopy  2018   CYSTOSCOPY N/A 09/14/2021   Procedure: CYSTOSCOPY;  Surgeon: [name], MD;  Location: [facility];  Service: Gynecology;  Laterality: N/A;   WISDOM TOOTH EXTRACTION  77 yrs old   Patient Active Problem List    Diagnosis Date Noted   PAD (peripheral artery disease) (HCC) 08/18/2022   Myalgia 08/18/2022   Uterovaginal prolapse, incomplete 09/14/2021   Right calf pain 03/24/2021   Primary osteoarthritis of right knee 12/24/2019   Sciatica, right side 10/29/2019   RUQ pain 07/20/2018   Influenza A 07/18/2018   Senile ecchymosis 02/03/2018   Atypical chest pain 04/17/2017   Bronchitis, acute 01/09/2017   Heart murmur 12/07/2016   Chronic shoulder pain 12/07/2016   Epistaxis 09/30/2015   Tinnitus of right ear 09/30/2015   Left arm pain 09/30/2015   Left hip pain 09/30/2015   Generalized osteoarthritis of multiple sites 09/30/2015   Medicare annual wellness visit, subsequent 04/14/2015   Globus sensation 02/23/2015   Paresthesia 11/08/2014   Osteopenia 04/15/2014   Prediabetes 04/07/2014   Pain in the wrist, unspecified laterality 03/03/2014   Paresthesia of left arm 11/16/2013   Lateral epicondylitis of left elbow 04/19/2013   Hematochezia 04/19/2013   DDD (degenerative disc disease), cervical 05/16/2012   Preventative health care 03/31/2011   JOINT PAIN, HAND 07/23/2010   DISTURBANCE OF SKIN SENSATION 07/04/2009   Hyperlipidemia, mixed 07/04/2009   Constipation 12/02/2008   TENESMUS 12/02/2008   Tachycardia 11/07/2007   Essential hypertension 06/01/2007   ALLERGIC RHINITIS 06/01/2007  PCP: [name], [name], MD  REFERRING PROVIDER: [name], [name], MD   REFERRING DIAG: Bilateral shoulder pain, unspecified chronicity   THERAPY DIAG:  Bilateral shoulder pain, unspecified chronicity  Muscle weakness (generalized)  Rationale for Evaluation and Treatment: Rehabilitation  ONSET DATE: 7 yrs  SUBJECTIVE:                                                                                                                                                                                      SUBJECTIVE STATEMENT: Pt reports new pain in front of L shoulder with reaching to the side in  addition to usual pain. Hand dominance: Right  PERTINENT HISTORY: As per md: She has received shoulder injections from orthopedics in the past. Her condition has worsened since her last visit in January. (PCP)  PAIN:  Are you having pain? Yes: NPRS scale: Right 5/10 left 7/10 Pain location: bilat shoulders Pain description: constant ache Aggravating factors: reaching over head, any shoulder movement Relieving factors: resting not moving  PRECAUTIONS: None  RED FLAGS: None   WEIGHT BEARING RESTRICTIONS: No  FALLS:  Has patient fallen in last 6 months? No  LIVING ENVIRONMENT: Lives with: lives with their family and lives with their spouse Lives in: House/apartment   OCCUPATION: Retired school teachers  PLOF: Independent  PATIENT GOALS: Be able to sleep without pain when I move.  NEXT MD VISIT: as needed  OBJECTIVE:  Note: Objective measures were completed at Evaluation unless otherwise noted.  DIAGNOSTIC FINDINGS:  Feb 2024 IMPRESSION: This MRI of the cervical spine without contrast shows the following: The spinal cord appears normal. At C3-C4, there is central disc protrusion and mild uncovertebral spurring causing mild spinal stenosis but no nerve root compression.  The degenerative changes have progressed compared to the 2013 MRI. At C4-C5, there are stable degenerative changes causing mild spinal stenosis and moderate left foraminal narrowing encroaching on the left C5 nerve root without causing definite compression. At C5-C6 there are degenerative changes but no spinal stenosis or nerve root compression. At C6-C7, degenerative changes cause moderate right foraminal narrowing encroaching upon the right C6 nerve root without causing definite nerve root compression.  The degenerative changes have progressed compared to 2013.  PATIENT SURVEYS :  FOTO Primary score 43% with goal of 59%  COGNITION: Overall cognitive status: Within functional limits for tasks  assessed     SENSATION: WFL  POSTURE: Forward head and rounded shoulders  UPPER EXTREMITY ROM:   Active ROM Right eval Left eval  Shoulder flexion 109 105  Shoulder extension 41 24  Shoulder abduction 83 69  Shoulder adduction    Shoulder internal rotation s1 Mid  glut  Shoulder external rotation c7 c7  Elbow flexion    Elbow extension    Wrist flexion    Wrist extension    Wrist ulnar deviation    Wrist radial deviation    Wrist pronation    Wrist supination    (Blank rows = not tested)  UPPER EXTREMITY MMT:  MMT Right eval Left eval  Shoulder flexion 3/5 in available range 3 limited to pain  Shoulder extension    Shoulder abduction 3- limited to pain 3- limited to pain  Shoulder adduction    Shoulder internal rotation    Shoulder external rotation    Middle trapezius 3 3  Lower trapezius    Elbow flexion 4P! 4P!  Elbow extension    Wrist flexion    Wrist extension    Wrist ulnar deviation    Wrist radial deviation    Wrist pronation    Wrist supination    Grip strength (lbs)    (Blank rows = not tested)  SHOULDER SPECIAL TESTS: Rotator cuff assessment: Empty can test: positive    PALPATION:  TTP   TODAY'S TREATMENT:                                                                                                                                           10/26  -PROM bil each plane -Gentle GHJ distraction bil -STM to L pec, lats - Seated Scapular Retraction  3" 2x10 - Seated Shoulder Shrugs x15 -supine cane chest press 2x10 -seated bicep curls 1# 2x10 ea -Seated press out 1#R, 0#L x15ea -Shoulder flexion iso 5" x10ea -Shoulder ER iso 5" x10ea  10/21  -PROM bil each plane -Gentle GHJ distraction bil -STM to bil pec, posterior shoulder, and biceps - Seated Scapular Retraction  3" 2x10 - Seated Shoulder Shrugs x15 -supine cane chest press 2x10 -Shoulder flexion iso 5" x10ea -Shoulder ER iso 5" x10ea  10/17  -PROM bil each  plane -Gentle GHJ distraction bil -STM to bil pec, posterior shoulder, and biceps - Seated Scapular Retraction   - Seated Shoulder Shrugs x15 -supine cane chest press x10   PATIENT EDUCATION: Education details: Discussed eval findings, rehab rationale, aquatic program progression/POC and pools in area. Patient is in agreement  Person educated: Patient Education method: Explanation Education comprehension: verbalized understanding  HOME EXERCISE PROGRAM: Access Code: [email or web address] URL: [email or web address] Date: 04/26/2023 Prepared by: Geni Bers  Exercises - Seated Scapular Retraction  - 1 x daily - 7 x weekly - 1 sets - 10 reps - Seated Shoulder Shrugs  - 1 x daily - 7 x weekly - 3 sets - 10 reps - Seated Shoulder Abduction Towel Slide at Table Top  - 1 x daily - 7 x weekly - 2 sets - 10 reps  ASSESSMENT:  CLINICAL IMPRESSION: Pt with significant tenderness to palpation of L pectoral mm as well as lats. L  shoulder remains limited due to pain and crepitus. Significant muscle guarding present throughout. Severe restrction with L IR and ER. Did not push throuh pain limits. Pt able to complete gentle AAROM in limited ranges. Cues required with isometrics for proper performance.   Eval: Patient is a 77 y.o. f who was seen today for physical therapy evaluation and treatment for shoulder pain. She reports many years of discomfort receiving PT for in past without any improvement.  She has had steroid injections but did not return to ortho because she was not ready for any surgical procedure.  She presents today with limited bilateral ROM, pain and strength deficits throughout shoulders.  She has tenderness with palpation bilaterally about subacromial bursa and rotator cuff insertion.  Pain is constant and interrupts sleep. She will benefit from skilled physical therapy to improve ue movement and strength allowing for decreased difficulty completing ADL's and decreasing  pain.   OBJECTIVE IMPAIRMENTS: decreased coordination, decreased ROM, decreased strength, impaired UE functional use, and pain.   ACTIVITY LIMITATIONS: carrying, lifting, bathing, dressing, and reach over head  PARTICIPATION LIMITATIONS: meal prep, cleaning, and laundry  PERSONAL FACTORS: Age and Time since onset of injury/illness/exacerbation are also affecting patient's functional outcome.   REHAB POTENTIAL: Good  CLINICAL DECISION MAKING: Stable/uncomplicated  EVALUATION COMPLEXITY: Low  GOALS: Goals reviewed with patient? Yes  SHORT TERM GOALS: Target date: 05/27/23  Pt will be indep and consistent with HEP to demonstrate commitment to improvement  Baseline: Goal status: INITIAL  2.  Pt will report decreased pain with ADL's/combing hair Baseline:  Goal status: INITIAL  3.  Pt will report a decrease in shoulder pain by 2 NPRS to improve comfort Baseline:   LONG TERM GOALS: Target date: 06/24/23  Pt to meet stated Foto Goal of 59% Baseline: 43% Goal status: INITIAL  2.  Pt will improve shoulder flex and abd ROM by 20D Baseline: see chart Goal status: INITIAL  3.  Pt will improve shoulder and scapular strength by at least 1 grade Baseline: see chart Goal status: INITIAL  4.  Pt will report ability to reach up into cabinet with less discomfort Baseline:  Goal status: INITIAL  5.  Pt will report reduced incidence of dropping objects/improved hand strength Baseline:  Goal status: INITIAL    PLAN: PT FREQUENCY: 1-2x/week  PT DURATION: 8 weeks12 visits.  Extended to allow for scheduling conflicts  PLANNED INTERVENTIONS: Therapeutic exercises, Therapeutic activity, Neuromuscular re-education, Balance training, Gait training, Patient/Family education, Self Care, Joint mobilization, DME instructions, Aquatic Therapy, Dry Needling, Cryotherapy, Moist heat, Taping, Ionotophoresis 4mg /ml Dexamethasone, Manual therapy, and Re-evaluation  PLAN FOR NEXT SESSION: Land  based: ROM and strengthening bilat ue shoulder complex. Consider TB; DN   Donnel Saxon Wandra Babin, PTA 05/14/2023, 12:23 PM

## 2023-05-16 ENCOUNTER — Ambulatory Visit (HOSPITAL_BASED_OUTPATIENT_CLINIC_OR_DEPARTMENT_OTHER): Payer: Medicare Other | Admitting: Physical Therapy

## 2023-05-16 ENCOUNTER — Encounter (HOSPITAL_BASED_OUTPATIENT_CLINIC_OR_DEPARTMENT_OTHER): Payer: Self-pay | Admitting: Physical Therapy

## 2023-05-16 DIAGNOSIS — M6281 Muscle weakness (generalized): Secondary | ICD-10-CM

## 2023-05-16 DIAGNOSIS — M25512 Pain in left shoulder: Secondary | ICD-10-CM

## 2023-05-16 DIAGNOSIS — M25511 Pain in right shoulder: Secondary | ICD-10-CM

## 2023-05-16 NOTE — Therapy (Signed)
OUTPATIENT PHYSICAL THERAPY UPPER EXTREMITY TREATMENT   Patient Name: Eileen Hansen MRN: [id number] DOB:1945-07-26, 77 y.o., female Today's Date: 05/16/2023  END OF SESSION:  PT End of Session - 05/16/23 0929     Visit Number 5    Number of Visits 12    Date for PT Re-Evaluation 06/24/23    PT Start Time 0928    PT Stop Time 1006    PT Time Calculation (min) 38 min    Activity Tolerance Patient tolerated treatment well    Behavior During Therapy [facility] for tasks assessed/performed                 Past Medical History:  Diagnosis Date   Allergic rhinitis    Arthralgia 09/30/2015   Arthritis    fingers   Bruises easily and skin thin 09/11/2021   Chronic  bilateral shoulder pain left shoulder limited rom 12/07/2016   Heart murmur 12/07/2016   HTN (hypertension)    Lexiscan Myoview 04/2021    Myoview 10/22: EF 78, no ischemia or infarction; low risk   mild Aortic valve insufficiency    Mild Mitral valve regurgitation    PAD (peripheral artery disease) (HCC)    Pain in the wrist, unspecified laterality 03/03/2014   Personal history of COVID-19 04/2021   mild symptoms x 1 week all symptoms resolved   Prolapse of anterior vaginal wall    Tachycardia    Episodic   Upper back pain    Wears glasses for reading    Wears partial denture upper    Past Surgical History:  Procedure Laterality Date   ANTERIOR AND POSTERIOR REPAIR WITH SACROSPINOUS FIXATION N/A 09/14/2021   Procedure: ANTERIOR REPAIR WITH SACROSPINOUS FIXATION;  Surgeon: [name], MD;  Location: [facility] [place];  Service: Gynecology;  Laterality: N/A;   APPENDECTOMY  77 yrs old   BUNIONECTOMY Bilateral    more than 40 yrs ago   colonscopy  2018   CYSTOSCOPY N/A 09/14/2021   Procedure: CYSTOSCOPY;  Surgeon: [name], MD;  Location: [facility];  Service: Gynecology;  Laterality: N/A;   WISDOM TOOTH EXTRACTION  77 yrs old   Patient Active Problem List    Diagnosis Date Noted   PAD (peripheral artery disease) (HCC) 08/18/2022   Myalgia 08/18/2022   Uterovaginal prolapse, incomplete 09/14/2021   Right calf pain 03/24/2021   Primary osteoarthritis of right knee 12/24/2019   Sciatica, right side 10/29/2019   RUQ pain 07/20/2018   Influenza A 07/18/2018   Senile ecchymosis 02/03/2018   Atypical chest pain 04/17/2017   Bronchitis, acute 01/09/2017   Heart murmur 12/07/2016   Chronic shoulder pain 12/07/2016   Epistaxis 09/30/2015   Tinnitus of right ear 09/30/2015   Left arm pain 09/30/2015   Left hip pain 09/30/2015   Generalized osteoarthritis of multiple sites 09/30/2015   Medicare annual wellness visit, subsequent 04/14/2015   Globus sensation 02/23/2015   Paresthesia 11/08/2014   Osteopenia 04/15/2014   Prediabetes 04/07/2014   Pain in the wrist, unspecified laterality 03/03/2014   Paresthesia of left arm 11/16/2013   Lateral epicondylitis of left elbow 04/19/2013   Hematochezia 04/19/2013   DDD (degenerative disc disease), cervical 05/16/2012   Preventative health care 03/31/2011   JOINT PAIN, HAND 07/23/2010   DISTURBANCE OF SKIN SENSATION 07/04/2009   Hyperlipidemia, mixed 07/04/2009   Constipation 12/02/2008   TENESMUS 12/02/2008   Tachycardia 11/07/2007   Essential hypertension 06/01/2007   ALLERGIC RHINITIS 06/01/2007  PCP: [name], [name], MD  REFERRING PROVIDER: [name], [name], MD   REFERRING DIAG: Bilateral shoulder pain, unspecified chronicity   THERAPY DIAG:  Bilateral shoulder pain, unspecified chronicity  Muscle weakness (generalized)  Rationale for Evaluation and Treatment: Rehabilitation  ONSET DATE: 7 yrs  SUBJECTIVE:                                                                                                                                                                                      SUBJECTIVE STATEMENT: I feel like I have a little more range and less apprehensive about  movement. I am now able to get dishes to second shelf.   Hand dominance: Right  PERTINENT HISTORY: As per md: She has received shoulder injections from orthopedics in the past. Her condition has worsened since her last visit in January. (PCP)  PAIN:  Are you having pain? Yes: NPRS scale: 4/10 Pain location: bilat shoulders Pain description: constant ache Aggravating factors: reaching over head, any shoulder movement Relieving factors: resting not moving  PRECAUTIONS: None  RED FLAGS: None   WEIGHT BEARING RESTRICTIONS: No  FALLS:  Has patient fallen in last 6 months? No  LIVING ENVIRONMENT: Lives with: lives with their family and lives with their spouse Lives in: House/apartment   OCCUPATION: Retired school teachers  PLOF: Independent  PATIENT GOALS: Be able to sleep without pain when I move.  NEXT MD VISIT: as needed  OBJECTIVE:  Note: Objective measures were completed at Evaluation unless otherwise noted.  DIAGNOSTIC FINDINGS:  Feb 2024 IMPRESSION: This MRI of the cervical spine without contrast shows the following: The spinal cord appears normal. At C3-C4, there is central disc protrusion and mild uncovertebral spurring causing mild spinal stenosis but no nerve root compression.  The degenerative changes have progressed compared to the 2013 MRI. At C4-C5, there are stable degenerative changes causing mild spinal stenosis and moderate left foraminal narrowing encroaching on the left C5 nerve root without causing definite compression. At C5-C6 there are degenerative changes but no spinal stenosis or nerve root compression. At C6-C7, degenerative changes cause moderate right foraminal narrowing encroaching upon the right C6 nerve root without causing definite nerve root compression.  The degenerative changes have progressed compared to 2013.  PATIENT SURVEYS :  FOTO Primary score 43% with goal of 59%  COGNITION: Overall cognitive status: Within functional limits  for tasks assessed     SENSATION: WFL  POSTURE: Forward head and rounded shoulders  UPPER EXTREMITY ROM:   Active ROM Right eval Left eval Rt/Lt 10/28  Shoulder flexion 109 105 120/114  Shoulder extension 41 24   Shoulder abduction 83 69   Shoulder  adduction     Shoulder internal rotation s1 Mid glut   Shoulder external rotation c7 c7   Elbow flexion     Elbow extension     Wrist flexion     Wrist extension     Wrist ulnar deviation     Wrist radial deviation     Wrist pronation     Wrist supination     (Blank rows = not tested)  UPPER EXTREMITY MMT:  MMT Right eval Left eval  Shoulder flexion 3/5 in available range 3 limited to pain  Shoulder extension    Shoulder abduction 3- limited to pain 3- limited to pain  Shoulder adduction    Shoulder internal rotation    Shoulder external rotation    Middle trapezius 3 3  Lower trapezius    Elbow flexion 4P! 4P!  Elbow extension    Wrist flexion    Wrist extension    Wrist ulnar deviation    Wrist radial deviation    Wrist pronation    Wrist supination    Grip strength (lbs)    (Blank rows = not tested)  SHOULDER SPECIAL TESTS: Rotator cuff assessment: Empty can test: positive    PALPATION:  TTP   TODAY'S TREATMENT:                                                                                                                                         Treatment                            10/28:  STM Lt subscap, passive Lt shoulder flexion Supine shoulder flexion with wand Supine protraction Seated scapular retraction Seated upper trap stretch Seated forward reach & return with scap retraction Low door pec stretch Discussed donning & doffing clothing   10/26  -PROM bil each plane -Gentle GHJ distraction bil -STM to L pec, lats - Seated Scapular Retraction  3" 2x10 - Seated Shoulder Shrugs x15 -supine cane chest press 2x10 -seated bicep curls 1# 2x10 ea -Seated press out 1#R, 0#L  x15ea -Shoulder flexion iso 5" x10ea -Shoulder ER iso 5" x10ea  10/21  -PROM bil each plane -Gentle GHJ distraction bil -STM to bil pec, posterior shoulder, and biceps - Seated Scapular Retraction  3" 2x10 - Seated Shoulder Shrugs x15 -supine cane chest press 2x10 -Shoulder flexion iso 5" x10ea -Shoulder ER iso 5" x10ea   PATIENT EDUCATION: Education details: Discussed eval findings, rehab rationale, aquatic program progression/POC and pools in area. Patient is in agreement  Person educated: Patient Education method: Explanation Education comprehension: verbalized understanding  HOME EXERCISE PROGRAM: Access Code: [email or web address] URL: [email or web address] Date: 04/26/2023 Prepared by: Geni Bers  Exercises - Seated Scapular Retraction  - 1 x daily - 7 x weekly - 1 sets - 10 reps - Seated Shoulder Shrugs  - 1 x daily -  7 x weekly - 3 sets - 10 reps - Seated Shoulder Abduction Towel Slide at Table Top  - 1 x daily - 7 x weekly - 2 sets - 10 reps  ASSESSMENT:  CLINICAL IMPRESSION: Improved available AROM into shoulder flexion. Bil shoulder hike noted as compensation for lack of scap mobility & addressed today. Encouraged frequent, short sets of exercises to respect pain while utilizing available ROM  Eval: Patient is a 77 y.o. f who was seen today for physical therapy evaluation and treatment for shoulder pain. She reports many years of discomfort receiving PT for in past without any improvement.  She has had steroid injections but did not return to ortho because she was not ready for any surgical procedure.  She presents today with limited bilateral ROM, pain and strength deficits throughout shoulders.  She has tenderness with palpation bilaterally about subacromial bursa and rotator cuff insertion.  Pain is constant and interrupts sleep. She will benefit from skilled physical therapy to improve ue movement and strength allowing for decreased difficulty completing  ADL's and decreasing pain.   OBJECTIVE IMPAIRMENTS: decreased coordination, decreased ROM, decreased strength, impaired UE functional use, and pain.   ACTIVITY LIMITATIONS: carrying, lifting, bathing, dressing, and reach over head  PARTICIPATION LIMITATIONS: meal prep, cleaning, and laundry  PERSONAL FACTORS: Age and Time since onset of injury/illness/exacerbation are also affecting patient's functional outcome.   REHAB POTENTIAL: Good  CLINICAL DECISION MAKING: Stable/uncomplicated  EVALUATION COMPLEXITY: Low  GOALS: Goals reviewed with patient? Yes  SHORT TERM GOALS: Target date: 05/27/23  Pt will be indep and consistent with HEP to demonstrate commitment to improvement  Baseline: Goal status: INITIAL  2.  Pt will report decreased pain with ADL's/combing hair Baseline:  Goal status: INITIAL  3.  Pt will report a decrease in shoulder pain by 2 NPRS to improve comfort Baseline:   LONG TERM GOALS: Target date: 06/24/23  Pt to meet stated Foto Goal of 59% Baseline: 43% Goal status: INITIAL  2.  Pt will improve shoulder flex and abd ROM by 20D Baseline: see chart Goal status: INITIAL  3.  Pt will improve shoulder and scapular strength by at least 1 grade Baseline: see chart Goal status: INITIAL  4.  Pt will report ability to reach up into cabinet with less discomfort Baseline:  Goal status: INITIAL  5.  Pt will report reduced incidence of dropping objects/improved hand strength Baseline:  Goal status: INITIAL    PLAN: PT FREQUENCY: 1-2x/week  PT DURATION: 8 weeks12 visits.  Extended to allow for scheduling conflicts  PLANNED INTERVENTIONS: Therapeutic exercises, Therapeutic activity, Neuromuscular re-education, Balance training, Gait training, Patient/Family education, Self Care, Joint mobilization, DME instructions, Aquatic Therapy, Dry Needling, Cryotherapy, Moist heat, Taping, Ionotophoresis 4mg /ml Dexamethasone, Manual therapy, and Re-evaluation  PLAN  FOR NEXT SESSION: Land based: ROM and strengthening bilat ue shoulder complex. Consider TB; DN   Tyquavious Gamel C. Brason Berthelot PT, DPT 05/16/23 10:08 AM

## 2023-05-23 ENCOUNTER — Ambulatory Visit (HOSPITAL_BASED_OUTPATIENT_CLINIC_OR_DEPARTMENT_OTHER): Payer: Medicare Other | Attending: Family Medicine | Admitting: Physical Therapy

## 2023-05-23 ENCOUNTER — Encounter (HOSPITAL_BASED_OUTPATIENT_CLINIC_OR_DEPARTMENT_OTHER): Payer: Self-pay | Admitting: Physical Therapy

## 2023-05-23 DIAGNOSIS — M25511 Pain in right shoulder: Secondary | ICD-10-CM | POA: Insufficient documentation

## 2023-05-23 DIAGNOSIS — M25512 Pain in left shoulder: Secondary | ICD-10-CM | POA: Insufficient documentation

## 2023-05-23 DIAGNOSIS — M6281 Muscle weakness (generalized): Secondary | ICD-10-CM | POA: Insufficient documentation

## 2023-05-23 NOTE — Therapy (Signed)
OUTPATIENT PHYSICAL THERAPY UPPER EXTREMITY TREATMENT   Patient Name: ROCSI HAZELBAKER MRN: [id number] DOB:[birth year], 77 y.o., female Today's Date: 05/23/2023  END OF SESSION:  PT End of Session - 05/23/23 1407     Visit Number 6    Number of Visits 12    Date for PT Re-Evaluation 06/24/23    PT Start Time 1406    PT Stop Time 1440    PT Time Calculation (min) 34 min    Activity Tolerance Patient tolerated treatment well    Behavior During Therapy [facility] for tasks assessed/performed                  Past Medical History:  Diagnosis Date   Allergic rhinitis    Arthralgia 09/30/2015   Arthritis    fingers   Bruises easily and skin thin 09/11/2021   Chronic  bilateral shoulder pain left shoulder limited rom 12/07/2016   Heart murmur 12/07/2016   HTN (hypertension)    Lexiscan Myoview 04/2021    Myoview 10/22: EF 78, no ischemia or infarction; low risk   mild Aortic valve insufficiency    Mild Mitral valve regurgitation    PAD (peripheral artery disease) (HCC)    Pain in the wrist, unspecified laterality 03/03/2014   Personal history of COVID-19 04/2021   mild symptoms x 1 week all symptoms resolved   Prolapse of anterior vaginal wall    Tachycardia    Episodic   Upper back pain    Wears glasses for reading    Wears partial denture upper    Past Surgical History:  Procedure Laterality Date   ANTERIOR AND POSTERIOR REPAIR WITH SACROSPINOUS FIXATION N/A 09/14/2021   Procedure: ANTERIOR REPAIR WITH SACROSPINOUS FIXATION;  Surgeon: [name], MD;  Location: [facility];  Service: Gynecology;  Laterality: N/A;   APPENDECTOMY  77 yrs old   BUNIONECTOMY Bilateral    more than 40 yrs ago   colonscopy  2018   CYSTOSCOPY N/A 09/14/2021   Procedure: CYSTOSCOPY;  Surgeon: [name], MD;  Location: [facility];  Service: Gynecology;  Laterality: N/A;   WISDOM TOOTH EXTRACTION  77 yrs old   Patient Active Problem List    Diagnosis Date Noted   PAD (peripheral artery disease) (HCC) 08/18/2022   Myalgia 08/18/2022   Uterovaginal prolapse, incomplete 09/14/2021   Right calf pain 03/24/2021   Primary osteoarthritis of right knee 12/24/2019   Sciatica, right side 10/29/2019   RUQ pain 07/20/2018   Influenza A 07/18/2018   Senile ecchymosis 02/03/2018   Atypical chest pain 04/17/2017   Bronchitis, acute 01/09/2017   Heart murmur 12/07/2016   Chronic shoulder pain 12/07/2016   Epistaxis 09/30/2015   Tinnitus of right ear 09/30/2015   Left arm pain 09/30/2015   Left hip pain 09/30/2015   Generalized osteoarthritis of multiple sites 09/30/2015   Medicare annual wellness visit, subsequent 04/14/2015   Globus sensation 02/23/2015   Paresthesia 11/08/2014   Osteopenia 04/15/2014   Prediabetes 04/07/2014   Pain in the wrist, unspecified laterality 03/03/2014   Paresthesia of left arm 11/16/2013   Lateral epicondylitis of left elbow 04/19/2013   Hematochezia 04/19/2013   DDD (degenerative disc disease), cervical 05/16/2012   Preventative health care 03/31/2011   JOINT PAIN, HAND 07/23/2010   DISTURBANCE OF SKIN SENSATION 07/04/2009   Hyperlipidemia, mixed 07/04/2009   Constipation 12/02/2008   TENESMUS 12/02/2008   Tachycardia 11/07/2007   Essential hypertension 06/01/2007   ALLERGIC RHINITIS 06/01/2007  PCP: [name], [name], MD  REFERRING PROVIDER: [name], [name], MD   REFERRING DIAG: Bilateral shoulder pain, unspecified chronicity   THERAPY DIAG:  Bilateral shoulder pain, unspecified chronicity  Muscle weakness (generalized)  Rationale for Evaluation and Treatment: Rehabilitation  ONSET DATE: 7 yrs  SUBJECTIVE:                                                                                                                                                                                      SUBJECTIVE STATEMENT: Left one is still worse than the right. Did a lot of work cooking dinner  and did fine.   Hand dominance: Right  PERTINENT HISTORY: As per md: She has received shoulder injections from orthopedics in the past. Her condition has worsened since her last visit in January. (PCP)  PAIN:  Are you having pain? Yes: NPRS scale: 4/10 Pain location: bilat shoulders Pain description: constant ache Aggravating factors: reaching over head, any shoulder movement Relieving factors: resting not moving  PRECAUTIONS: None  RED FLAGS: None   WEIGHT BEARING RESTRICTIONS: No  FALLS:  Has patient fallen in last 6 months? No  LIVING ENVIRONMENT: Lives with: lives with their family and lives with their spouse Lives in: House/apartment   OCCUPATION: Retired school teachers  PLOF: Independent  PATIENT GOALS: Be able to sleep without pain when I move.  NEXT MD VISIT: as needed  OBJECTIVE:  Note: Objective measures were completed at Evaluation unless otherwise noted.  DIAGNOSTIC FINDINGS:  Feb 2024 IMPRESSION: This MRI of the cervical spine without contrast shows the following: The spinal cord appears normal. At C3-C4, there is central disc protrusion and mild uncovertebral spurring causing mild spinal stenosis but no nerve root compression.  The degenerative changes have progressed compared to the 2013 MRI. At C4-C5, there are stable degenerative changes causing mild spinal stenosis and moderate left foraminal narrowing encroaching on the left C5 nerve root without causing definite compression. At C5-C6 there are degenerative changes but no spinal stenosis or nerve root compression. At C6-C7, degenerative changes cause moderate right foraminal narrowing encroaching upon the right C6 nerve root without causing definite nerve root compression.  The degenerative changes have progressed compared to 2013.  PATIENT SURVEYS :  FOTO Primary score 43% with goal of 59%  COGNITION: Overall cognitive status: Within functional limits for tasks  assessed     SENSATION: WFL  POSTURE: Forward head and rounded shoulders  UPPER EXTREMITY ROM:   Active ROM Right eval Left eval Rt/Lt 10/28  Shoulder flexion 109 105 120/114  Shoulder extension 41 24   Shoulder abduction 83 69   Shoulder adduction     Shoulder  internal rotation s1 Mid glut   Shoulder external rotation c7 c7   Elbow flexion     Elbow extension     Wrist flexion     Wrist extension     Wrist ulnar deviation     Wrist radial deviation     Wrist pronation     Wrist supination     (Blank rows = not tested)  UPPER EXTREMITY MMT:  MMT Right eval Left eval  Shoulder flexion 3/5 in available range 3 limited to pain  Shoulder extension    Shoulder abduction 3- limited to pain 3- limited to pain  Shoulder adduction    Shoulder internal rotation    Shoulder external rotation    Middle trapezius 3 3  Lower trapezius    Elbow flexion 4P! 4P!  Elbow extension    Wrist flexion    Wrist extension    Wrist ulnar deviation    Wrist radial deviation    Wrist pronation    Wrist supination    Grip strength (lbs)    (Blank rows = not tested)  SHOULDER SPECIAL TESTS: Rotator cuff assessment: Empty can test: positive    PALPATION:  TTP   TODAY'S TREATMENT:                                                                                                                                         Treatment                            11/4:  Pulleys flexion- passive & with overhead hold. Trial of supine circles- too painful Sidelying ER with cues for scapular retraction Seated biceps curls- supinated & neutral 2lb, repeated without weight Gait- added arm swing for mobility & balance STM Rt upper trap, cervical distraction   Treatment                            10/28:  STM Lt subscap, passive Lt shoulder flexion Supine shoulder flexion with wand Supine protraction Seated scapular retraction Seated upper trap stretch Seated forward reach & return with scap  retraction Low door pec stretch Discussed donning & doffing clothing   10/26  -PROM bil each plane -Gentle GHJ distraction bil -STM to L pec, lats - Seated Scapular Retraction  3" 2x10 - Seated Shoulder Shrugs x15 -supine cane chest press 2x10 -seated bicep curls 1# 2x10 ea -Seated press out 1#R, 0#L x15ea -Shoulder flexion iso 5" x10ea -Shoulder ER iso 5" x10ea    PATIENT EDUCATION: Education details: Discussed eval findings, rehab rationale, aquatic program progression/POC and pools in area. Patient is in agreement  Person educated: Patient Education method: Explanation Education comprehension: verbalized understanding  HOME EXERCISE PROGRAM: Access Code: [email or web address] URL: [email or web address]   ASSESSMENT:  CLINICAL IMPRESSION: Notable improvement in gait stability with  arm swing.compensation with shoulder hike in left shoulder elevation that, when improved, decreased her pain at end range flexion.   Eval: Patient is a 77 y.o. f who was seen today for physical therapy evaluation and treatment for shoulder pain. She reports many years of discomfort receiving PT for in past without any improvement.  She has had steroid injections but did not return to ortho because she was not ready for any surgical procedure.  She presents today with limited bilateral ROM, pain and strength deficits throughout shoulders.  She has tenderness with palpation bilaterally about subacromial bursa and rotator cuff insertion.  Pain is constant and interrupts sleep. She will benefit from skilled physical therapy to improve ue movement and strength allowing for decreased difficulty completing ADL's and decreasing pain.   OBJECTIVE IMPAIRMENTS: decreased coordination, decreased ROM, decreased strength, impaired UE functional use, and pain.   ACTIVITY LIMITATIONS: carrying, lifting, bathing, dressing, and reach over head  PARTICIPATION LIMITATIONS: meal prep, cleaning, and  laundry  PERSONAL FACTORS: Age and Time since onset of injury/illness/exacerbation are also affecting patient's functional outcome.   REHAB POTENTIAL: Good  CLINICAL DECISION MAKING: Stable/uncomplicated  EVALUATION COMPLEXITY: Low  GOALS: Goals reviewed with patient? Yes  SHORT TERM GOALS: Target date: 05/27/23  Pt will be indep and consistent with HEP to demonstrate commitment to improvement  Baseline: Goal status:achieved  2.  Pt will report decreased pain with ADL's/combing hair Baseline:  Goal status: achieved  3.  Pt will report a decrease in shoulder pain by 2 NPRS to improve comfort Baseline:   LONG TERM GOALS: Target date: 06/24/23  Pt to meet stated Foto Goal of 59% Baseline: 43% Goal status: INITIAL  2.  Pt will improve shoulder flex and abd ROM by 20D Baseline: see chart Goal status: INITIAL  3.  Pt will improve shoulder and scapular strength by at least 1 grade Baseline: see chart Goal status: INITIAL  4.  Pt will report ability to reach up into cabinet with less discomfort Baseline:  Goal status: INITIAL  5.  Pt will report reduced incidence of dropping objects/improved hand strength Baseline:  Goal status: INITIAL    PLAN: PT FREQUENCY: 1-2x/week  PT DURATION: 8 weeks12 visits.  Extended to allow for scheduling conflicts  PLANNED INTERVENTIONS: Therapeutic exercises, Therapeutic activity, Neuromuscular re-education, Balance training, Gait training, Patient/Family education, Self Care, Joint mobilization, DME instructions, Aquatic Therapy, Dry Needling, Cryotherapy, Moist heat, Taping, Ionotophoresis 4mg /ml Dexamethasone, Manual therapy, and Re-evaluation  PLAN FOR NEXT SESSION: Land based: ROM and strengthening bilat ue shoulder complex. Consider TB; DN   Osiris Odriscoll C. Serine Kea PT, DPT 05/23/23 2:42 PM

## 2023-05-28 ENCOUNTER — Telehealth (HOSPITAL_BASED_OUTPATIENT_CLINIC_OR_DEPARTMENT_OTHER): Payer: Self-pay | Admitting: Rehabilitative and Restorative Service Providers"

## 2023-05-28 ENCOUNTER — Ambulatory Visit (HOSPITAL_BASED_OUTPATIENT_CLINIC_OR_DEPARTMENT_OTHER): Payer: Medicare Other | Admitting: Rehabilitative and Restorative Service Providers"

## 2023-05-28 NOTE — Telephone Encounter (Signed)
PT phoned pt. Pt forgot about appt due to family being in town. Very apologetic.

## 2023-05-28 NOTE — Therapy (Deleted)
OUTPATIENT PHYSICAL THERAPY UPPER EXTREMITY TREATMENT   Patient Name: Eileen Hansen MRN: [id number] DOB:1945/08/29, 77 y.o., female Today's Date: 05/28/2023  END OF SESSION:         Past Medical History:  Diagnosis Date   Allergic rhinitis    Arthralgia 09/30/2015   Arthritis    fingers   Bruises easily and skin thin 09/11/2021   Chronic  bilateral shoulder pain left shoulder limited rom 12/07/2016   Heart murmur 12/07/2016   HTN (hypertension)    Lexiscan Myoview 04/2021    Myoview 10/22: EF 78, no ischemia or infarction; low risk   mild Aortic valve insufficiency    Mild Mitral valve regurgitation    PAD (peripheral artery disease) (HCC)    Pain in the wrist, unspecified laterality 03/03/2014   Personal history of COVID-19 04/2021   mild symptoms x 1 week all symptoms resolved   Prolapse of anterior vaginal wall    Tachycardia    Episodic   Upper back pain    Wears glasses for reading    Wears partial denture upper    Past Surgical History:  Procedure Laterality Date   ANTERIOR AND POSTERIOR REPAIR WITH SACROSPINOUS FIXATION N/A 09/14/2021   Procedure: ANTERIOR REPAIR WITH SACROSPINOUS FIXATION;  Surgeon: [name], MD;  Location: [facility] [place];  Service: Gynecology;  Laterality: N/A;   APPENDECTOMY  77 yrs old   BUNIONECTOMY Bilateral    more than 40 yrs ago   colonscopy  2018   CYSTOSCOPY N/A 09/14/2021   Procedure: CYSTOSCOPY;  Surgeon: [name], MD;  Location: [facility];  Service: Gynecology;  Laterality: N/A;   WISDOM TOOTH EXTRACTION  77 yrs old   Patient Active Problem List   Diagnosis Date Noted   PAD (peripheral artery disease) (HCC) 08/18/2022   Myalgia 08/18/2022   Uterovaginal prolapse, incomplete 09/14/2021   Right calf pain 03/24/2021   Primary osteoarthritis of right knee 12/24/2019   Sciatica, right side 10/29/2019   RUQ pain 07/20/2018   Influenza A 07/18/2018   Senile ecchymosis  02/03/2018   Atypical chest pain 04/17/2017   Bronchitis, acute 01/09/2017   Heart murmur 12/07/2016   Chronic shoulder pain 12/07/2016   Epistaxis 09/30/2015   Tinnitus of right ear 09/30/2015   Left arm pain 09/30/2015   Left hip pain 09/30/2015   Generalized osteoarthritis of multiple sites 09/30/2015   Medicare annual wellness visit, subsequent 04/14/2015   Globus sensation 02/23/2015   Paresthesia 11/08/2014   Osteopenia 04/15/2014   Prediabetes 04/07/2014   Pain in the wrist, unspecified laterality 03/03/2014   Paresthesia of left arm 11/16/2013   Lateral epicondylitis of left elbow 04/19/2013   Hematochezia 04/19/2013   DDD (degenerative disc disease), cervical 05/16/2012   Preventative health care 03/31/2011   JOINT PAIN, HAND 07/23/2010   DISTURBANCE OF SKIN SENSATION 07/04/2009   Hyperlipidemia, mixed 07/04/2009   Constipation 12/02/2008   TENESMUS 12/02/2008   Tachycardia 11/07/2007   Essential hypertension 06/01/2007   ALLERGIC RHINITIS 06/01/2007    PCP: [name], [name], MD  REFERRING PROVIDER: [name], [name], MD   REFERRING DIAG: Bilateral shoulder pain, unspecified chronicity   THERAPY DIAG:  No diagnosis found.  Rationale for Evaluation and Treatment: Rehabilitation  ONSET DATE: 7 yrs  SUBJECTIVE:  SUBJECTIVE STATEMENT: Left one is still worse than the right. Did a lot of work cooking dinner and did fine.   Hand dominance: Right  PERTINENT HISTORY: As per md: She has received shoulder injections from orthopedics in the past. Her condition has worsened since her last visit in January. (PCP)  PAIN:  Are you having pain? Yes: NPRS scale: 4/10 Pain location: bilat shoulders Pain description: constant ache Aggravating factors: reaching over head, any shoulder  movement Relieving factors: resting not moving  PRECAUTIONS: None  RED FLAGS: None   WEIGHT BEARING RESTRICTIONS: No  FALLS:  Has patient fallen in last 6 months? No  LIVING ENVIRONMENT: Lives with: lives with their family and lives with their spouse Lives in: House/apartment   OCCUPATION: Retired school teachers  PLOF: Independent  PATIENT GOALS: Be able to sleep without pain when I move.  NEXT MD VISIT: as needed  OBJECTIVE:  Note: Objective measures were completed at Evaluation unless otherwise noted.  DIAGNOSTIC FINDINGS:  Feb 2024 IMPRESSION: This MRI of the cervical spine without contrast shows the following: The spinal cord appears normal. At C3-C4, there is central disc protrusion and mild uncovertebral spurring causing mild spinal stenosis but no nerve root compression.  The degenerative changes have progressed compared to the 2013 MRI. At C4-C5, there are stable degenerative changes causing mild spinal stenosis and moderate left foraminal narrowing encroaching on the left C5 nerve root without causing definite compression. At C5-C6 there are degenerative changes but no spinal stenosis or nerve root compression. At C6-C7, degenerative changes cause moderate right foraminal narrowing encroaching upon the right C6 nerve root without causing definite nerve root compression.  The degenerative changes have progressed compared to 2013.  PATIENT SURVEYS :  FOTO Primary score 43% with goal of 59%  COGNITION: Overall cognitive status: Within functional limits for tasks assessed     SENSATION: WFL  POSTURE: Forward head and rounded shoulders  UPPER EXTREMITY ROM:   Active ROM Right eval Left eval Rt/Lt 10/28  Shoulder flexion 109 105 120/114  Shoulder extension 41 24   Shoulder abduction 83 69   Shoulder adduction     Shoulder internal rotation s1 Mid glut   Shoulder external rotation c7 c7   Elbow flexion     Elbow extension     Wrist flexion      Wrist extension     Wrist ulnar deviation     Wrist radial deviation     Wrist pronation     Wrist supination     (Blank rows = not tested)  UPPER EXTREMITY MMT:  MMT Right eval Left eval  Shoulder flexion 3/5 in available range 3 limited to pain  Shoulder extension    Shoulder abduction 3- limited to pain 3- limited to pain  Shoulder adduction    Shoulder internal rotation    Shoulder external rotation    Middle trapezius 3 3  Lower trapezius    Elbow flexion 4P! 4P!  Elbow extension    Wrist flexion    Wrist extension    Wrist ulnar deviation    Wrist radial deviation    Wrist pronation    Wrist supination    Grip strength (lbs)    (Blank rows = not tested)  SHOULDER SPECIAL TESTS: Rotator cuff assessment: Empty can test: positive    PALPATION:  TTP   TODAY'S TREATMENT:                [facility] Adult PT Treatment:  DATE: 05/28/23 Therapeutic Exercise: *** Manual Therapy: *** Neuromuscular re-ed: *** Therapeutic Activity: *** Modalities: *** Self Care: ***                                                                                                                             Treatment                            11/4:  Pulleys flexion- passive & with overhead hold. Trial of supine circles- too painful Sidelying ER with cues for scapular retraction Seated biceps curls- supinated & neutral 2lb, repeated without weight Gait- added arm swing for mobility & balance STM Rt upper trap, cervical distraction   Treatment                            10/28:  STM Lt subscap, passive Lt shoulder flexion Supine shoulder flexion with wand Supine protraction Seated scapular retraction Seated upper trap stretch Seated forward reach & return with scap retraction Low door pec stretch Discussed donning & doffing clothing   10/26  -PROM bil each plane -Gentle GHJ distraction bil -STM to L pec, lats - Seated  Scapular Retraction  3" 2x10 - Seated Shoulder Shrugs x15 -supine cane chest press 2x10 -seated bicep curls 1# 2x10 ea -Seated press out 1#R, 0#L x15ea -Shoulder flexion iso 5" x10ea -Shoulder ER iso 5" x10ea    PATIENT EDUCATION: Education details: Discussed eval findings, rehab rationale, aquatic program progression/POC and pools in area. Patient is in agreement  Person educated: Patient Education method: Explanation Education comprehension: verbalized understanding  HOME EXERCISE PROGRAM: Access Code: [email or web address] URL: [email or web address]   ASSESSMENT:  CLINICAL IMPRESSION: Notable improvement in gait stability with arm swing.compensation with shoulder hike in left shoulder elevation that, when improved, decreased her pain at end range flexion.   Eval: Patient is a 77 y.o. f who was seen today for physical therapy evaluation and treatment for shoulder pain. She reports many years of discomfort receiving PT for in past without any improvement.  She has had steroid injections but did not return to ortho because she was not ready for any surgical procedure.  She presents today with limited bilateral ROM, pain and strength deficits throughout shoulders.  She has tenderness with palpation bilaterally about subacromial bursa and rotator cuff insertion.  Pain is constant and interrupts sleep. She will benefit from skilled physical therapy to improve ue movement and strength allowing for decreased difficulty completing ADL's and decreasing pain.   OBJECTIVE IMPAIRMENTS: decreased coordination, decreased ROM, decreased strength, impaired UE functional use, and pain.   ACTIVITY LIMITATIONS: carrying, lifting, bathing, dressing, and reach over head  PARTICIPATION LIMITATIONS: meal prep, cleaning, and laundry  PERSONAL FACTORS: Age and Time since onset of injury/illness/exacerbation are also affecting patient's functional outcome.   REHAB POTENTIAL: Good  CLINICAL DECISION  MAKING: Stable/uncomplicated  EVALUATION COMPLEXITY: Low  GOALS: Goals reviewed with patient? Yes  SHORT TERM GOALS: Target date: 05/27/23  Pt will be indep and consistent with HEP to demonstrate commitment to improvement  Baseline: Goal status:achieved  2.  Pt will report decreased pain with ADL's/combing hair Baseline:  Goal status: achieved  3.  Pt will report a decrease in shoulder pain by 2 NPRS to improve comfort Baseline:   LONG TERM GOALS: Target date: 06/24/23  Pt to meet stated Foto Goal of 59% Baseline: 43% Goal status: INITIAL  2.  Pt will improve shoulder flex and abd ROM by 20D Baseline: see chart Goal status: INITIAL  3.  Pt will improve shoulder and scapular strength by at least 1 grade Baseline: see chart Goal status: INITIAL  4.  Pt will report ability to reach up into cabinet with less discomfort Baseline:  Goal status: INITIAL  5.  Pt will report reduced incidence of dropping objects/improved hand strength Baseline:  Goal status: INITIAL    PLAN: PT FREQUENCY: 1-2x/week  PT DURATION: 8 weeks12 visits.  Extended to allow for scheduling conflicts  PLANNED INTERVENTIONS: Therapeutic exercises, Therapeutic activity, Neuromuscular re-education, Balance training, Gait training, Patient/Family education, Self Care, Joint mobilization, DME instructions, Aquatic Therapy, Dry Needling, Cryotherapy, Moist heat, Taping, Ionotophoresis 4mg /ml Dexamethasone, Manual therapy, and Re-evaluation  PLAN FOR NEXT SESSION: Land based: ROM and strengthening bilat ue shoulder complex. Consider TB; DN

## 2023-05-30 ENCOUNTER — Ambulatory Visit (HOSPITAL_BASED_OUTPATIENT_CLINIC_OR_DEPARTMENT_OTHER): Payer: Medicare Other

## 2023-05-30 ENCOUNTER — Encounter (HOSPITAL_BASED_OUTPATIENT_CLINIC_OR_DEPARTMENT_OTHER): Payer: Self-pay

## 2023-05-30 DIAGNOSIS — M25511 Pain in right shoulder: Secondary | ICD-10-CM

## 2023-05-30 DIAGNOSIS — M25512 Pain in left shoulder: Secondary | ICD-10-CM | POA: Diagnosis not present

## 2023-05-30 DIAGNOSIS — M6281 Muscle weakness (generalized): Secondary | ICD-10-CM | POA: Diagnosis not present

## 2023-05-30 NOTE — Therapy (Signed)
OUTPATIENT PHYSICAL THERAPY UPPER EXTREMITY TREATMENT   Patient Name: Eileen Hansen MRN: [id number] DOB:1945-10-16, 77 y.o., female Today's Date: 05/30/2023  END OF SESSION:  PT End of Session - 05/30/23 1428     Visit Number 7    Number of Visits 12    Date for PT Re-Evaluation 06/24/23    PT Start Time 1357    PT Stop Time 1432    PT Time Calculation (min) 35 min    Activity Tolerance Patient tolerated treatment well    Behavior During Therapy [facility] for tasks assessed/performed                   Past Medical History:  Diagnosis Date   Allergic rhinitis    Arthralgia 09/30/2015   Arthritis    fingers   Bruises easily and skin thin 09/11/2021   Chronic  bilateral shoulder pain left shoulder limited rom 12/07/2016   Heart murmur 12/07/2016   HTN (hypertension)    Lexiscan Myoview 04/2021    Myoview 10/22: EF 78, no ischemia or infarction; low risk   mild Aortic valve insufficiency    Mild Mitral valve regurgitation    PAD (peripheral artery disease) (HCC)    Pain in the wrist, unspecified laterality 03/03/2014   Personal history of COVID-19 04/2021   mild symptoms x 1 week all symptoms resolved   Prolapse of anterior vaginal wall    Tachycardia    Episodic   Upper back pain    Wears glasses for reading    Wears partial denture upper    Past Surgical History:  Procedure Laterality Date   ANTERIOR AND POSTERIOR REPAIR WITH SACROSPINOUS FIXATION N/A 09/14/2021   Procedure: ANTERIOR REPAIR WITH SACROSPINOUS FIXATION;  Surgeon: [name], MD;  Location: [facility] [place];  Service: Gynecology;  Laterality: N/A;   APPENDECTOMY  77 yrs old   BUNIONECTOMY Bilateral    more than 40 yrs ago   colonscopy  2018   CYSTOSCOPY N/A 09/14/2021   Procedure: CYSTOSCOPY;  Surgeon: [name], MD;  Location: [facility];  Service: Gynecology;  Laterality: N/A;   WISDOM TOOTH EXTRACTION  77 yrs old   Patient Active Problem List    Diagnosis Date Noted   PAD (peripheral artery disease) (HCC) 08/18/2022   Myalgia 08/18/2022   Uterovaginal prolapse, incomplete 09/14/2021   Right calf pain 03/24/2021   Primary osteoarthritis of right knee 12/24/2019   Sciatica, right side 10/29/2019   RUQ pain 07/20/2018   Influenza A 07/18/2018   Senile ecchymosis 02/03/2018   Atypical chest pain 04/17/2017   Bronchitis, acute 01/09/2017   Heart murmur 12/07/2016   Chronic shoulder pain 12/07/2016   Epistaxis 09/30/2015   Tinnitus of right ear 09/30/2015   Left arm pain 09/30/2015   Left hip pain 09/30/2015   Generalized osteoarthritis of multiple sites 09/30/2015   Medicare annual wellness visit, subsequent 04/14/2015   Globus sensation 02/23/2015   Paresthesia 11/08/2014   Osteopenia 04/15/2014   Prediabetes 04/07/2014   Pain in the wrist, unspecified laterality 03/03/2014   Paresthesia of left arm 11/16/2013   Lateral epicondylitis of left elbow 04/19/2013   Hematochezia 04/19/2013   DDD (degenerative disc disease), cervical 05/16/2012   Preventative health care 03/31/2011   JOINT PAIN, HAND 07/23/2010   DISTURBANCE OF SKIN SENSATION 07/04/2009   Hyperlipidemia, mixed 07/04/2009   Constipation 12/02/2008   TENESMUS 12/02/2008   Tachycardia 11/07/2007   Essential hypertension 06/01/2007   ALLERGIC RHINITIS 06/01/2007  PCP: [name], [name], MD  REFERRING PROVIDER: [name], [name], MD   REFERRING DIAG: Bilateral shoulder pain, unspecified chronicity   THERAPY DIAG:  Bilateral shoulder pain, unspecified chronicity  Muscle weakness (generalized)  Rationale for Evaluation and Treatment: Rehabilitation  ONSET DATE: 7 yrs  SUBJECTIVE:                                                                                                                                                                                      SUBJECTIVE STATEMENT: Pt feels that the exercises are helping. "I've been using my arms more,  especially when I walk." Had flu and covid vaccines recently which caused a significant amount of bruising at injection area.  Hand dominance: Right  PERTINENT HISTORY: As per md: She has received shoulder injections from orthopedics in the past. Her condition has worsened since her last visit in January. (PCP)  PAIN:  Are you having pain? Yes: NPRS scale: 4/10 Pain location: bilat shoulders Pain description: constant ache Aggravating factors: reaching over head, any shoulder movement Relieving factors: resting not moving  PRECAUTIONS: None  RED FLAGS: None   WEIGHT BEARING RESTRICTIONS: No  FALLS:  Has patient fallen in last 6 months? No  LIVING ENVIRONMENT: Lives with: lives with their family and lives with their spouse Lives in: House/apartment   OCCUPATION: Retired school teachers  PLOF: Independent  PATIENT GOALS: Be able to sleep without pain when I move.  NEXT MD VISIT: as needed  OBJECTIVE:  Note: Objective measures were completed at Evaluation unless otherwise noted.  DIAGNOSTIC FINDINGS:  Feb 2024 IMPRESSION: This MRI of the cervical spine without contrast shows the following: The spinal cord appears normal. At C3-C4, there is central disc protrusion and mild uncovertebral spurring causing mild spinal stenosis but no nerve root compression.  The degenerative changes have progressed compared to the 2013 MRI. At C4-C5, there are stable degenerative changes causing mild spinal stenosis and moderate left foraminal narrowing encroaching on the left C5 nerve root without causing definite compression. At C5-C6 there are degenerative changes but no spinal stenosis or nerve root compression. At C6-C7, degenerative changes cause moderate right foraminal narrowing encroaching upon the right C6 nerve root without causing definite nerve root compression.  The degenerative changes have progressed compared to 2013.  PATIENT SURVEYS :  FOTO Primary score 43% with goal of  59%  COGNITION: Overall cognitive status: Within functional limits for tasks assessed     SENSATION: WFL  POSTURE: Forward head and rounded shoulders  UPPER EXTREMITY ROM:   Active ROM Right eval Left eval Rt/Lt 10/28  Shoulder flexion 109 105 120/114  Shoulder extension 41 24  Shoulder abduction 83 69   Shoulder adduction     Shoulder internal rotation s1 Mid glut   Shoulder external rotation c7 c7   Elbow flexion     Elbow extension     Wrist flexion     Wrist extension     Wrist ulnar deviation     Wrist radial deviation     Wrist pronation     Wrist supination     (Blank rows = not tested)  UPPER EXTREMITY MMT:  MMT Right eval Left eval  Shoulder flexion 3/5 in available range 3 limited to pain  Shoulder extension    Shoulder abduction 3- limited to pain 3- limited to pain  Shoulder adduction    Shoulder internal rotation    Shoulder external rotation    Middle trapezius 3 3  Lower trapezius    Elbow flexion 4P! 4P!  Elbow extension    Wrist flexion    Wrist extension    Wrist ulnar deviation    Wrist radial deviation    Wrist pronation    Wrist supination    Grip strength (lbs)    (Blank rows = not tested)  SHOULDER SPECIAL TESTS: Rotator cuff assessment: Empty can test: positive    PALPATION:  TTP   TODAY'S TREATMENT:                                                                                                                                           11/11:  -PROM bil each plane -Gentle GHJ distraction bil - Seated Scapular Retraction  3" 2x10 - Seated Shoulder Shrugs x15 -seated shoulder rolls x20 -supine wand chest press 1# bar 2x10 -supine wand flexion 1# bar 2x10 -seated bicep curls 1# 2x10 ea -Seated press out 1#R, 0#L [id number]  Treatment                            11/4:  Pulleys flexion- passive & with overhead hold. Trial of supine circles- too painful Sidelying ER with cues for scapular retraction Seated biceps curls-  supinated & neutral 2lb, repeated without weight Gait- added arm swing for mobility & balance STM Rt upper trap, cervical distraction   Treatment                            10/28:  STM Lt subscap, passive Lt shoulder flexion Supine shoulder flexion with wand Supine protraction Seated scapular retraction Seated upper trap stretch Seated forward reach & return with scap retraction Low door pec stretch Discussed donning & doffing clothing   10/26  -PROM bil each plane -Gentle GHJ distraction bil -STM to L pec, lats - Seated Scapular Retraction  3" 2x10 - Seated Shoulder Shrugs x15 -supine cane chest press 2x10 -seated bicep curls 1# 2x10 ea -Seated press out 1#R,  0#L [id number] -Shoulder flexion iso 5" x10ea -Shoulder ER iso 5" x10ea    PATIENT EDUCATION: Education details: Discussed eval findings, rehab rationale, aquatic program progression/POC and pools in area. Patient is in agreement  Person educated: Patient Education method: Explanation Education comprehension: verbalized understanding  HOME EXERCISE PROGRAM: Access Code: [email or web address] URL: [email or web address]   ASSESSMENT:  CLINICAL IMPRESSION: Advised pt have arm checked out by MD or at pharmacy due to reaction from recent vaccine and c/o itchiness in the area. Pt with improved available motion in L shoulder flexion and abd PROM. Remains limited with L IR/ER due to crepitus. Some pain in L shoulder with wand flexion using 1# bar.   Eval: Patient is a 77 y.o. f who was seen today for physical therapy evaluation and treatment for shoulder pain. She reports many years of discomfort receiving PT for in past without any improvement.  She has had steroid injections but did not return to ortho because she was not ready for any surgical procedure.  She presents today with limited bilateral ROM, pain and strength deficits throughout shoulders.  She has tenderness with palpation bilaterally about subacromial bursa  and rotator cuff insertion.  Pain is constant and interrupts sleep. She will benefit from skilled physical therapy to improve ue movement and strength allowing for decreased difficulty completing ADL's and decreasing pain.   OBJECTIVE IMPAIRMENTS: decreased coordination, decreased ROM, decreased strength, impaired UE functional use, and pain.   ACTIVITY LIMITATIONS: carrying, lifting, bathing, dressing, and reach over head  PARTICIPATION LIMITATIONS: meal prep, cleaning, and laundry  PERSONAL FACTORS: Age and Time since onset of injury/illness/exacerbation are also affecting patient's functional outcome.   REHAB POTENTIAL: Good  CLINICAL DECISION MAKING: Stable/uncomplicated  EVALUATION COMPLEXITY: Low  GOALS: Goals reviewed with patient? Yes  SHORT TERM GOALS: Target date: 05/27/23  Pt will be indep and consistent with HEP to demonstrate commitment to improvement  Baseline: Goal status:achieved  2.  Pt will report decreased pain with ADL's/combing hair Baseline:  Goal status: achieved  3.  Pt will report a decrease in shoulder pain by 2 NPRS to improve comfort Baseline:   LONG TERM GOALS: Target date: 06/24/23  Pt to meet stated Foto Goal of 59% Baseline: 43% Goal status: INITIAL  2.  Pt will improve shoulder flex and abd ROM by 20D Baseline: see chart Goal status: INITIAL  3.  Pt will improve shoulder and scapular strength by at least 1 grade Baseline: see chart Goal status: INITIAL  4.  Pt will report ability to reach up into cabinet with less discomfort Baseline:  Goal status: INITIAL  5.  Pt will report reduced incidence of dropping objects/improved hand strength Baseline:  Goal status: INITIAL    PLAN: PT FREQUENCY: 1-2x/week  PT DURATION: 8 weeks12 visits.  Extended to allow for scheduling conflicts  PLANNED INTERVENTIONS: Therapeutic exercises, Therapeutic activity, Neuromuscular re-education, Balance training, Gait training, Patient/Family  education, Self Care, Joint mobilization, DME instructions, Aquatic Therapy, Dry Needling, Cryotherapy, Moist heat, Taping, Ionotophoresis 4mg /ml Dexamethasone, Manual therapy, and Re-evaluation  PLAN FOR NEXT SESSION: Land based: ROM and strengthening bilat ue shoulder complex. Consider TB; DN   Riki Altes, PTA  05/30/23 3:10 PM

## 2023-06-02 ENCOUNTER — Ambulatory Visit (HOSPITAL_BASED_OUTPATIENT_CLINIC_OR_DEPARTMENT_OTHER): Payer: Medicare Other

## 2023-06-02 ENCOUNTER — Encounter (HOSPITAL_BASED_OUTPATIENT_CLINIC_OR_DEPARTMENT_OTHER): Payer: Self-pay

## 2023-06-02 DIAGNOSIS — M25512 Pain in left shoulder: Secondary | ICD-10-CM | POA: Diagnosis not present

## 2023-06-02 DIAGNOSIS — M25511 Pain in right shoulder: Secondary | ICD-10-CM | POA: Diagnosis not present

## 2023-06-02 DIAGNOSIS — M6281 Muscle weakness (generalized): Secondary | ICD-10-CM | POA: Diagnosis not present

## 2023-06-02 NOTE — Therapy (Signed)
OUTPATIENT PHYSICAL THERAPY UPPER EXTREMITY TREATMENT   Patient Name: Eileen Hansen MRN: [id number] DOB:1946-02-27, 77 y.o., female Today's Date: 06/02/2023  END OF SESSION:  PT End of Session - 06/02/23 1354     Visit Number 8    Number of Visits 12    Date for PT Re-Evaluation 06/24/23    PT Start Time 1351    PT Stop Time 1430    PT Time Calculation (min) 39 min    Activity Tolerance Patient tolerated treatment well    Behavior During Therapy [facility] for tasks assessed/performed                    Past Medical History:  Diagnosis Date   Allergic rhinitis    Arthralgia 09/30/2015   Arthritis    fingers   Bruises easily and skin thin 09/11/2021   Chronic  bilateral shoulder pain left shoulder limited rom 12/07/2016   Heart murmur 12/07/2016   HTN (hypertension)    Lexiscan Myoview 04/2021    Myoview 10/22: EF 78, no ischemia or infarction; low risk   mild Aortic valve insufficiency    Mild Mitral valve regurgitation    PAD (peripheral artery disease) (HCC)    Pain in the wrist, unspecified laterality 03/03/2014   Personal history of COVID-19 04/2021   mild symptoms x 1 week all symptoms resolved   Prolapse of anterior vaginal wall    Tachycardia    Episodic   Upper back pain    Wears glasses for reading    Wears partial denture upper    Past Surgical History:  Procedure Laterality Date   ANTERIOR AND POSTERIOR REPAIR WITH SACROSPINOUS FIXATION N/A 09/14/2021   Procedure: ANTERIOR REPAIR WITH SACROSPINOUS FIXATION;  Surgeon: [name], MD;  Location: [facility];  Service: Gynecology;  Laterality: N/A;   APPENDECTOMY  77 yrs old   BUNIONECTOMY Bilateral    more than 40 yrs ago   colonscopy  2018   CYSTOSCOPY N/A 09/14/2021   Procedure: CYSTOSCOPY;  Surgeon: [name], MD;  Location: [facility];  Service: Gynecology;  Laterality: N/A;   WISDOM TOOTH EXTRACTION  77 yrs old   Patient Active Problem List    Diagnosis Date Noted   PAD (peripheral artery disease) (HCC) 08/18/2022   Myalgia 08/18/2022   Uterovaginal prolapse, incomplete 09/14/2021   Right calf pain 03/24/2021   Primary osteoarthritis of right knee 12/24/2019   Sciatica, right side 10/29/2019   RUQ pain 07/20/2018   Influenza A 07/18/2018   Senile ecchymosis 02/03/2018   Atypical chest pain 04/17/2017   Bronchitis, acute 01/09/2017   Heart murmur 12/07/2016   Chronic shoulder pain 12/07/2016   Epistaxis 09/30/2015   Tinnitus of right ear 09/30/2015   Left arm pain 09/30/2015   Left hip pain 09/30/2015   Generalized osteoarthritis of multiple sites 09/30/2015   Medicare annual wellness visit, subsequent 04/14/2015   Globus sensation 02/23/2015   Paresthesia 11/08/2014   Osteopenia 04/15/2014   Prediabetes 04/07/2014   Pain in the wrist, unspecified laterality 03/03/2014   Paresthesia of left arm 11/16/2013   Lateral epicondylitis of left elbow 04/19/2013   Hematochezia 04/19/2013   DDD (degenerative disc disease), cervical 05/16/2012   Preventative health care 03/31/2011   JOINT PAIN, HAND 07/23/2010   DISTURBANCE OF SKIN SENSATION 07/04/2009   Hyperlipidemia, mixed 07/04/2009   Constipation 12/02/2008   TENESMUS 12/02/2008   Tachycardia 11/07/2007   Essential hypertension 06/01/2007   ALLERGIC RHINITIS  06/01/2007    PCP: Hansen, Eileen G, MD  REFERRING PROVIDER: [name], Eileen G, MD   REFERRING DIAG: Bilateral shoulder pain, unspecified chronicity   THERAPY DIAG:  Bilateral shoulder pain, unspecified chronicity  Muscle weakness (generalized)  Rationale for Evaluation and Treatment: Rehabilitation  ONSET DATE: 7 yrs  SUBJECTIVE:                                                                                                                                                                                      SUBJECTIVE STATEMENT: Pt reports the bruising from her vaccinations is improved. Pharmacist  could not tell her what caused it. "The therapy is working." "I'm able to reach more. I can now reach the second shelf of my cabinet."  Hand dominance: Right  PERTINENT HISTORY: As per md: She has received shoulder injections from orthopedics in the past. Her condition has worsened since her last visit in January. (PCP)  PAIN:  Are you having pain? Yes: NPRS scale: 4/10 Pain location: bilat shoulders Pain description: constant ache Aggravating factors: reaching over head, any shoulder movement Relieving factors: resting not moving  PRECAUTIONS: None  RED FLAGS: None   WEIGHT BEARING RESTRICTIONS: No  FALLS:  Has patient fallen in last 6 months? No  LIVING ENVIRONMENT: Lives with: lives with their family and lives with their spouse Lives in: House/apartment   OCCUPATION: Retired school teachers  PLOF: Independent  PATIENT GOALS: Be able to sleep without pain when I move.  NEXT MD VISIT: as needed  OBJECTIVE:  Note: Objective measures were completed at Evaluation unless otherwise noted.  DIAGNOSTIC FINDINGS:  Feb 2024 IMPRESSION: This MRI of the cervical spine without contrast shows the following: The spinal cord appears normal. At C3-C4, there is central disc protrusion and mild uncovertebral spurring causing mild spinal stenosis but no nerve root compression.  The degenerative changes have progressed compared to the 2013 MRI. At C4-C5, there are stable degenerative changes causing mild spinal stenosis and moderate left foraminal narrowing encroaching on the left C5 nerve root without causing definite compression. At C5-C6 there are degenerative changes but no spinal stenosis or nerve root compression. At C6-C7, degenerative changes cause moderate right foraminal narrowing encroaching upon the right C6 nerve root without causing definite nerve root compression.  The degenerative changes have progressed compared to 2013.  PATIENT SURVEYS :  FOTO Primary score 43% with  goal of 59%  COGNITION: Overall cognitive status: Within functional limits for tasks assessed     SENSATION: WFL  POSTURE: Forward head and rounded shoulders  UPPER EXTREMITY ROM:   Active ROM Right eval Left eval Rt/Lt 10/28  Shoulder flexion 109 105  120/114  Shoulder extension 41 24   Shoulder abduction 83 69   Shoulder adduction     Shoulder internal rotation s1 Mid glut   Shoulder external rotation c7 c7   Elbow flexion     Elbow extension     Wrist flexion     Wrist extension     Wrist ulnar deviation     Wrist radial deviation     Wrist pronation     Wrist supination     (Blank rows = not tested)  UPPER EXTREMITY MMT:  MMT Right eval Left eval  Shoulder flexion 3/5 in available range 3 limited to pain  Shoulder extension    Shoulder abduction 3- limited to pain 3- limited to pain  Shoulder adduction    Shoulder internal rotation    Shoulder external rotation    Middle trapezius 3 3  Lower trapezius    Elbow flexion 4P! 4P!  Elbow extension    Wrist flexion    Wrist extension    Wrist ulnar deviation    Wrist radial deviation    Wrist pronation    Wrist supination    Grip strength (lbs)    (Blank rows = not tested)  SHOULDER SPECIAL TESTS: Rotator cuff assessment: Empty can test: positive    PALPATION:  TTP   TODAY'S TREATMENT:                                                                                                                                          11/14:  -PROM bil each plane -Gentle GHJ distraction bil - Seated Scapular Retraction  3" 2x10 -supine wand chest press 1# bar 2x10 -supine wand flexion 1# bar 2x10 -Supine active flexion x10ea Seated wand flexion 1# bar x12   11/11:  -PROM bil each plane -Gentle GHJ distraction bil - Seated Scapular Retraction  3" 2x10 - Seated Shoulder Shrugs x15 -seated shoulder rolls x20 -supine wand chest press 1# bar 2x10 -supine wand flexion 1# bar 2x10 -seated bicep curls 1#  2x10 ea -Seated press out 1#R, 0#L [id number]  Treatment                            11/4:  Pulleys flexion- passive & with overhead hold. Trial of supine circles- too painful Sidelying ER with cues for scapular retraction Seated biceps curls- supinated & neutral 2lb, repeated without weight Gait- added arm swing for mobility & balance STM Rt upper trap, cervical distraction   Treatment                            10/28:  STM Lt subscap, passive Lt shoulder flexion Supine shoulder flexion with wand Supine protraction Seated scapular retraction Seated upper trap stretch Seated forward reach & return with scap retraction Low door pec stretch  Discussed donning & doffing clothing      PATIENT EDUCATION: Education details: Discussed eval findings, rehab rationale, aquatic program progression/POC and pools in area. Patient is in agreement  Person educated: Patient Education method: Explanation Education comprehension: verbalized understanding  HOME EXERCISE PROGRAM: Access Code: [email or web address] URL: [email or web address]   ASSESSMENT:  CLINICAL IMPRESSION: Pt with improving bilateral shoulder mobility. Remains very limited with L shoulder IR. Crepitus present in L shoulder with movement. She had good tolerance for gentle progressions to exercises. Will continue to progress as tolerated.   Eval: Patient is a 77 y.o. f who was seen today for physical therapy evaluation and treatment for shoulder pain. She reports many years of discomfort receiving PT for in past without any improvement.  She has had steroid injections but did not return to ortho because she was not ready for any surgical procedure.  She presents today with limited bilateral ROM, pain and strength deficits throughout shoulders.  She has tenderness with palpation bilaterally about subacromial bursa and rotator cuff insertion.  Pain is constant and interrupts sleep. She will benefit from skilled physical therapy to  improve ue movement and strength allowing for decreased difficulty completing ADL's and decreasing pain.   OBJECTIVE IMPAIRMENTS: decreased coordination, decreased ROM, decreased strength, impaired UE functional use, and pain.   ACTIVITY LIMITATIONS: carrying, lifting, bathing, dressing, and reach over head  PARTICIPATION LIMITATIONS: meal prep, cleaning, and laundry  PERSONAL FACTORS: Age and Time since onset of injury/illness/exacerbation are also affecting patient's functional outcome.   REHAB POTENTIAL: Good  CLINICAL DECISION MAKING: Stable/uncomplicated  EVALUATION COMPLEXITY: Low  GOALS: Goals reviewed with patient? Yes  SHORT TERM GOALS: Target date: 05/27/23  Pt will be indep and consistent with HEP to demonstrate commitment to improvement  Baseline: Goal status:achieved  2.  Pt will report decreased pain with ADL's/combing hair Baseline:  Goal status: achieved  3.  Pt will report a decrease in shoulder pain by 2 NPRS to improve comfort Baseline:   LONG TERM GOALS: Target date: 06/24/23  Pt to meet stated Foto Goal of 59% Baseline: 43% Goal status: INITIAL  2.  Pt will improve shoulder flex and abd ROM by 20D Baseline: see chart Goal status: INITIAL  3.  Pt will improve shoulder and scapular strength by at least 1 grade Baseline: see chart Goal status: INITIAL  4.  Pt will report ability to reach up into cabinet with less discomfort Baseline:  Goal status: INITIAL  5.  Pt will report reduced incidence of dropping objects/improved hand strength Baseline:  Goal status: INITIAL    PLAN: PT FREQUENCY: 1-2x/week  PT DURATION: 8 weeks12 visits.  Extended to allow for scheduling conflicts  PLANNED INTERVENTIONS: Therapeutic exercises, Therapeutic activity, Neuromuscular re-education, Balance training, Gait training, Patient/Family education, Self Care, Joint mobilization, DME instructions, Aquatic Therapy, Dry Needling, Cryotherapy, Moist heat, Taping,  Ionotophoresis 4mg /ml Dexamethasone, Manual therapy, and Re-evaluation  PLAN FOR NEXT SESSION: Land based: ROM and strengthening bilat ue shoulder complex. Consider TB; DN   Riki Altes, PTA  06/02/23 2:55 PM

## 2023-06-06 ENCOUNTER — Encounter (HOSPITAL_BASED_OUTPATIENT_CLINIC_OR_DEPARTMENT_OTHER): Payer: Self-pay | Admitting: Physical Therapy

## 2023-06-06 ENCOUNTER — Ambulatory Visit (HOSPITAL_BASED_OUTPATIENT_CLINIC_OR_DEPARTMENT_OTHER): Payer: Medicare Other | Admitting: Physical Therapy

## 2023-06-06 DIAGNOSIS — M6281 Muscle weakness (generalized): Secondary | ICD-10-CM

## 2023-06-06 DIAGNOSIS — M25512 Pain in left shoulder: Secondary | ICD-10-CM | POA: Diagnosis not present

## 2023-06-06 DIAGNOSIS — M25511 Pain in right shoulder: Secondary | ICD-10-CM | POA: Diagnosis not present

## 2023-06-06 NOTE — Therapy (Addendum)
OUTPATIENT PHYSICAL THERAPY UPPER EXTREMITY TREATMENT   Patient Name: Eileen Hansen MRN: [id number] DOB:1945-12-12, 77 y.o., female Today's Date: 06/06/2023  END OF SESSION:  PT End of Session - 06/06/23 1407     Visit Number 9    Number of Visits 12    Date for PT Re-Evaluation 06/24/23    PT Start Time 1407    PT Stop Time 1442    PT Time Calculation (min) 35 min    Activity Tolerance Patient tolerated treatment well    Behavior During Therapy [facility] for tasks assessed/performed                    Past Medical History:  Diagnosis Date   Allergic rhinitis    Arthralgia 09/30/2015   Arthritis    fingers   Bruises easily and skin thin 09/11/2021   Chronic  bilateral shoulder pain left shoulder limited rom 12/07/2016   Heart murmur 12/07/2016   HTN (hypertension)    Lexiscan Myoview 04/2021    Myoview 10/22: EF 78, no ischemia or infarction; low risk   mild Aortic valve insufficiency    Mild Mitral valve regurgitation    PAD (peripheral artery disease) (HCC)    Pain in the wrist, unspecified laterality 03/03/2014   Personal history of COVID-19 04/2021   mild symptoms x 1 week all symptoms resolved   Prolapse of anterior vaginal wall    Tachycardia    Episodic   Upper back pain    Wears glasses for reading    Wears partial denture upper    Past Surgical History:  Procedure Laterality Date   ANTERIOR AND POSTERIOR REPAIR WITH SACROSPINOUS FIXATION N/A 09/14/2021   Procedure: ANTERIOR REPAIR WITH SACROSPINOUS FIXATION;  Surgeon: [name], MD;  Location: [facility];  Service: Gynecology;  Laterality: N/A;   APPENDECTOMY  77 yrs old   BUNIONECTOMY Bilateral    more than 40 yrs ago   colonscopy  2018   CYSTOSCOPY N/A 09/14/2021   Procedure: CYSTOSCOPY;  Surgeon: [name], MD;  Location: [facility];  Service: Gynecology;  Laterality: N/A;   WISDOM TOOTH EXTRACTION  77 yrs old   Patient Active Problem List    Diagnosis Date Noted   PAD (peripheral artery disease) (HCC) 08/18/2022   Myalgia 08/18/2022   Uterovaginal prolapse, incomplete 09/14/2021   Right calf pain 03/24/2021   Primary osteoarthritis of right knee 12/24/2019   Sciatica, right side 10/29/2019   RUQ pain 07/20/2018   Influenza A 07/18/2018   Senile ecchymosis 02/03/2018   Atypical chest pain 04/17/2017   Bronchitis, acute 01/09/2017   Heart murmur 12/07/2016   Chronic shoulder pain 12/07/2016   Epistaxis 09/30/2015   Tinnitus of right ear 09/30/2015   Left arm pain 09/30/2015   Left hip pain 09/30/2015   Generalized osteoarthritis of multiple sites 09/30/2015   Medicare annual wellness visit, subsequent 04/14/2015   Globus sensation 02/23/2015   Paresthesia 11/08/2014   Osteopenia 04/15/2014   Prediabetes 04/07/2014   Pain in the wrist, unspecified laterality 03/03/2014   Paresthesia of left arm 11/16/2013   Lateral epicondylitis of left elbow 04/19/2013   Hematochezia 04/19/2013   DDD (degenerative disc disease), cervical 05/16/2012   Preventative health care 03/31/2011   JOINT PAIN, HAND 07/23/2010   DISTURBANCE OF SKIN SENSATION 07/04/2009   Hyperlipidemia, mixed 07/04/2009   Constipation 12/02/2008   TENESMUS 12/02/2008   Tachycardia 11/07/2007   Essential hypertension 06/01/2007   ALLERGIC RHINITIS  06/01/2007    PCP: [name], [name], MD  REFERRING PROVIDER: [name], [name], MD   REFERRING DIAG: Bilateral shoulder pain, unspecified chronicity   THERAPY DIAG:  Bilateral shoulder pain, unspecified chronicity  Muscle weakness (generalized)  Rationale for Evaluation and Treatment: Rehabilitation  ONSET DATE: 7 yrs  SUBJECTIVE:                                                                                                                                                                                      SUBJECTIVE STATEMENT: Progress Note Reporting Period 04/25/23 to 06/15/2023   See note  below for Objective Data and Assessment of Progress/Goals.     I have not really not noticed it.   Hand dominance: Right  PERTINENT HISTORY: As per md: She has received shoulder injections from orthopedics in the past. Her condition has worsened since her last visit in January. (PCP)  PAIN:  Are you having pain? Yes: NPRS scale: 2-3/10 Pain location: bilat shoulders Pain description: constant ache Aggravating factors: reaching over head, any shoulder movement Relieving factors: resting not moving  PRECAUTIONS: None  RED FLAGS: None   WEIGHT BEARING RESTRICTIONS: No  FALLS:  Has patient fallen in last 6 months? No  LIVING ENVIRONMENT: Lives with: lives with their family and lives with their spouse Lives in: House/apartment   OCCUPATION: Retired school teachers  PLOF: Independent  PATIENT GOALS: Be able to sleep without pain when I move.  NEXT MD VISIT: as needed  OBJECTIVE:  Note: Objective measures were completed at Evaluation unless otherwise noted.  DIAGNOSTIC FINDINGS:  Feb 2024 IMPRESSION: This MRI of the cervical spine without contrast shows the following: The spinal cord appears normal. At C3-C4, there is central disc protrusion and mild uncovertebral spurring causing mild spinal stenosis but no nerve root compression.  The degenerative changes have progressed compared to the 2013 MRI. At C4-C5, there are stable degenerative changes causing mild spinal stenosis and moderate left foraminal narrowing encroaching on the left C5 nerve root without causing definite compression. At C5-C6 there are degenerative changes but no spinal stenosis or nerve root compression. At C6-C7, degenerative changes cause moderate right foraminal narrowing encroaching upon the right C6 nerve root without causing definite nerve root compression.  The degenerative changes have progressed compared to 2013.  PATIENT SURVEYS :  FOTO Primary score 43% with goal of  59%  COGNITION: Overall cognitive status: Within functional limits for tasks assessed     SENSATION: WFL  POSTURE: Forward head and rounded shoulders  UPPER EXTREMITY ROM:   Active ROM Right eval Left eval Rt/Lt 10/28 Rt/Lt 11/18  Shoulder flexion 109 105 120/114 130/100  Shoulder extension 41 24    Shoulder abduction 83 69    Shoulder adduction      Shoulder internal rotation s1 Mid glut    Shoulder external rotation c7 c7    Elbow flexion      Elbow extension      Wrist flexion      Wrist extension      Wrist ulnar deviation      Wrist radial deviation      Wrist pronation      Wrist supination      (Blank rows = not tested)  UPPER EXTREMITY MMT:  MMT Right eval Left eval  Shoulder flexion 3/5 in available range 3 limited to pain  Shoulder extension    Shoulder abduction 3- limited to pain 3- limited to pain  Shoulder adduction    Shoulder internal rotation    Shoulder external rotation    Middle trapezius 3 3  Lower trapezius    Elbow flexion 4P! 4P!  Elbow extension    Wrist flexion    Wrist extension    Wrist ulnar deviation    Wrist radial deviation    Wrist pronation    Wrist supination    Grip strength (lbs)    (Blank rows = not tested)  SHOULDER SPECIAL TESTS: Rotator cuff assessment: Empty can test: positive    PALPATION:  TTP   TODAY'S TREATMENT:                                                                                                                                          Treatment                            11/18:  Manual: STM bil subscap paired with passive flexion, suboccipital release, cervical paraspinal STM- able to increase AROM Rt 140/Lt 110 following manual Supine shoulder flexion with wand Horiz abd/add for chest stretch Low diag pull yellow tband- minimal tolerance today.    11/14:  -PROM bil each plane -Gentle GHJ distraction bil - Seated Scapular Retraction  3" 2x10 -supine wand chest press 1# bar  2x10 -supine wand flexion 1# bar 2x10 -Supine active flexion x10ea Seated wand flexion 1# bar x12   11/11:  -PROM bil each plane -Gentle GHJ distraction bil - Seated Scapular Retraction  3" 2x10 - Seated Shoulder Shrugs x15 -seated shoulder rolls x20 -supine wand chest press 1# bar 2x10 -supine wand flexion 1# bar 2x10 -seated bicep curls 1# 2x10 ea -Seated press out 1#R, 0#L [id number]  PATIENT EDUCATION: Education details: Discussed eval findings, rehab rationale, aquatic program progression/POC and pools in area. Patient is in agreement  Person educated: Patient Education method: Explanation Education comprehension: verbalized understanding  HOME EXERCISE PROGRAM: Access Code: [email or web address] URL: [email or web address]   ASSESSMENT:  CLINICAL IMPRESSION: Able to significantly increase available AROM bilaterally but limited  against gravity, especially on right side. Multiple cavitations bilaterally with all motion, encouraged her to move but stop pushing through when it becomes irritated.   Eval: Patient is a 77 y.o. f who was seen today for physical therapy evaluation and treatment for shoulder pain. She reports many years of discomfort receiving PT for in past without any improvement.  She has had steroid injections but did not return to ortho because she was not ready for any surgical procedure.  She presents today with limited bilateral ROM, pain and strength deficits throughout shoulders.  She has tenderness with palpation bilaterally about subacromial bursa and rotator cuff insertion.  Pain is constant and interrupts sleep. She will benefit from skilled physical therapy to improve ue movement and strength allowing for decreased difficulty completing ADL's and decreasing pain.   OBJECTIVE IMPAIRMENTS: decreased coordination, decreased ROM, decreased strength, impaired UE functional use, and pain.   ACTIVITY LIMITATIONS: carrying, lifting, bathing, dressing, and reach  over head  PARTICIPATION LIMITATIONS: meal prep, cleaning, and laundry  PERSONAL FACTORS: Age and Time since onset of injury/illness/exacerbation are also affecting patient's functional outcome.   REHAB POTENTIAL: Good  CLINICAL DECISION MAKING: Stable/uncomplicated  EVALUATION COMPLEXITY: Low  GOALS: Goals reviewed with patient? Yes  SHORT TERM GOALS: Target date: 05/27/23  Pt will be indep and consistent with HEP to demonstrate commitment to improvement  Baseline: Goal status:achieved  2.  Pt will report decreased pain with ADL's/combing hair Baseline:  Goal status: achieved  3.  Pt will report a decrease in shoulder pain by 2 NPRS to improve comfort Baseline:   LONG TERM GOALS: Target date: 06/24/23  Pt to meet stated Foto Goal of 59% Baseline: 43% Goal status: INITIAL  2.  Pt will improve shoulder flex and abd ROM by 20D Baseline: see chart Goal status: INITIAL  3.  Pt will improve shoulder and scapular strength by at least 1 grade Baseline: see chart Goal status: INITIAL  4.  Pt will report ability to reach up into cabinet with less discomfort Baseline:  Goal status: INITIAL  5.  Pt will report reduced incidence of dropping objects/improved hand strength Baseline:  Goal status: INITIAL    PLAN: PT FREQUENCY: 1-2x/week  PT DURATION: 8 weeks12 visits.  Extended to allow for scheduling conflicts  PLANNED INTERVENTIONS: Therapeutic exercises, Therapeutic activity, Neuromuscular re-education, Balance training, Gait training, Patient/Family education, Self Care, Joint mobilization, DME instructions, Aquatic Therapy, Dry Needling, Cryotherapy, Moist heat, Taping, Ionotophoresis 4mg /ml Dexamethasone, Manual therapy, and Re-evaluation  PLAN FOR NEXT SESSION: Land based: ROM and strengthening bilat ue shoulder complex. Consider TB   Charee Tumblin C. Herny Scurlock PT, DPT 06/06/23 2:44 PM

## 2023-06-09 ENCOUNTER — Encounter (HOSPITAL_BASED_OUTPATIENT_CLINIC_OR_DEPARTMENT_OTHER): Payer: Self-pay

## 2023-06-09 ENCOUNTER — Ambulatory Visit (HOSPITAL_BASED_OUTPATIENT_CLINIC_OR_DEPARTMENT_OTHER): Payer: Medicare Other

## 2023-06-09 DIAGNOSIS — M25511 Pain in right shoulder: Secondary | ICD-10-CM

## 2023-06-09 DIAGNOSIS — M6281 Muscle weakness (generalized): Secondary | ICD-10-CM

## 2023-06-09 DIAGNOSIS — M25512 Pain in left shoulder: Secondary | ICD-10-CM | POA: Diagnosis not present

## 2023-06-09 NOTE — Therapy (Signed)
OUTPATIENT PHYSICAL THERAPY UPPER EXTREMITY TREATMENT   Patient Name: Eileen Hansen MRN: [id number] DOB:1946/07/19, 77 y.o., female Today's Date: 06/09/2023  END OF SESSION:  PT End of Session - 06/09/23 1458     Visit Number 10    Number of Visits 12    Date for PT Re-Evaluation 06/24/23    PT Start Time 1352    PT Stop Time 1436    PT Time Calculation (min) 44 min    Activity Tolerance Patient tolerated treatment well    Behavior During Therapy [facility] for tasks assessed/performed                     Past Medical History:  Diagnosis Date   Allergic rhinitis    Arthralgia 09/30/2015   Arthritis    fingers   Bruises easily and skin thin 09/11/2021   Chronic  bilateral shoulder pain left shoulder limited rom 12/07/2016   Heart murmur 12/07/2016   HTN (hypertension)    Lexiscan Myoview 04/2021    Myoview 10/22: EF 78, no ischemia or infarction; low risk   mild Aortic valve insufficiency    Mild Mitral valve regurgitation    PAD (peripheral artery disease) (HCC)    Pain in the wrist, unspecified laterality 03/03/2014   Personal history of COVID-19 04/2021   mild symptoms x 1 week all symptoms resolved   Prolapse of anterior vaginal wall    Tachycardia    Episodic   Upper back pain    Wears glasses for reading    Wears partial denture upper    Past Surgical History:  Procedure Laterality Date   ANTERIOR AND POSTERIOR REPAIR WITH SACROSPINOUS FIXATION N/A 09/14/2021   Procedure: ANTERIOR REPAIR WITH SACROSPINOUS FIXATION;  Surgeon: [name], MD;  Location: [facility] [place];  Service: Gynecology;  Laterality: N/A;   APPENDECTOMY  77 yrs old   BUNIONECTOMY Bilateral    more than 40 yrs ago   colonscopy  2018   CYSTOSCOPY N/A 09/14/2021   Procedure: CYSTOSCOPY;  Surgeon: [name], MD;  Location: [facility];  Service: Gynecology;  Laterality: N/A;   WISDOM TOOTH EXTRACTION  77 yrs old   Patient Active Problem  List   Diagnosis Date Noted   PAD (peripheral artery disease) (HCC) 08/18/2022   Myalgia 08/18/2022   Uterovaginal prolapse, incomplete 09/14/2021   Right calf pain 03/24/2021   Primary osteoarthritis of right knee 12/24/2019   Sciatica, right side 10/29/2019   RUQ pain 07/20/2018   Influenza A 07/18/2018   Senile ecchymosis 02/03/2018   Atypical chest pain 04/17/2017   Bronchitis, acute 01/09/2017   Heart murmur 12/07/2016   Chronic shoulder pain 12/07/2016   Epistaxis 09/30/2015   Tinnitus of right ear 09/30/2015   Left arm pain 09/30/2015   Left hip pain 09/30/2015   Generalized osteoarthritis of multiple sites 09/30/2015   Medicare annual wellness visit, subsequent 04/14/2015   Globus sensation 02/23/2015   Paresthesia 11/08/2014   Osteopenia 04/15/2014   Prediabetes 04/07/2014   Pain in the wrist, unspecified laterality 03/03/2014   Paresthesia of left arm 11/16/2013   Lateral epicondylitis of left elbow 04/19/2013   Hematochezia 04/19/2013   DDD (degenerative disc disease), cervical 05/16/2012   Preventative health care 03/31/2011   JOINT PAIN, HAND 07/23/2010   DISTURBANCE OF SKIN SENSATION 07/04/2009   Hyperlipidemia, mixed 07/04/2009   Constipation 12/02/2008   TENESMUS 12/02/2008   Tachycardia 11/07/2007   Essential hypertension 06/01/2007   ALLERGIC  RHINITIS 06/01/2007    PCP: [name], [name], MD  REFERRING PROVIDER: [name], [name], MD   REFERRING DIAG: Bilateral shoulder pain, unspecified chronicity   THERAPY DIAG:  Bilateral shoulder pain, unspecified chronicity  Muscle weakness (generalized)  Rationale for Evaluation and Treatment: Rehabilitation  ONSET DATE: 7 yrs  SUBJECTIVE:                                                                                                                                                                                      SUBJECTIVE STATEMENT: Pt reports continued improvement with reaching into cabinets.  Having trouble with putting on jackets.  Hand dominance: Right  PERTINENT HISTORY: As per md: She has received shoulder injections from orthopedics in the past. Her condition has worsened since her last visit in January. (PCP)  PAIN:  Are you having pain? Yes: NPRS scale: 2-3/10 Pain location: bilat shoulders Pain description: constant ache Aggravating factors: reaching over head, any shoulder movement Relieving factors: resting not moving  PRECAUTIONS: None  RED FLAGS: None   WEIGHT BEARING RESTRICTIONS: No  FALLS:  Has patient fallen in last 6 months? No  LIVING ENVIRONMENT: Lives with: lives with their family and lives with their spouse Lives in: House/apartment   OCCUPATION: Retired school teachers  PLOF: Independent  PATIENT GOALS: Be able to sleep without pain when I move.  NEXT MD VISIT: as needed  OBJECTIVE:  Note: Objective measures were completed at Evaluation unless otherwise noted.  DIAGNOSTIC FINDINGS:  Feb 2024 IMPRESSION: This MRI of the cervical spine without contrast shows the following: The spinal cord appears normal. At C3-C4, there is central disc protrusion and mild uncovertebral spurring causing mild spinal stenosis but no nerve root compression.  The degenerative changes have progressed compared to the 2013 MRI. At C4-C5, there are stable degenerative changes causing mild spinal stenosis and moderate left foraminal narrowing encroaching on the left C5 nerve root without causing definite compression. At C5-C6 there are degenerative changes but no spinal stenosis or nerve root compression. At C6-C7, degenerative changes cause moderate right foraminal narrowing encroaching upon the right C6 nerve root without causing definite nerve root compression.  The degenerative changes have progressed compared to 2013.  PATIENT SURVEYS :  FOTO Primary score 43% with goal of 59%  COGNITION: Overall cognitive status: Within functional limits for tasks  assessed     SENSATION: WFL  POSTURE: Forward head and rounded shoulders  UPPER EXTREMITY ROM:   Active ROM Right eval Left eval Rt/Lt 10/28 Rt/Lt 11/18  Shoulder flexion 109 105 120/114 130/100  Shoulder extension 41 24    Shoulder abduction 83 69    Shoulder adduction  Shoulder internal rotation s1 Mid glut    Shoulder external rotation c7 c7    Elbow flexion      Elbow extension      Wrist flexion      Wrist extension      Wrist ulnar deviation      Wrist radial deviation      Wrist pronation      Wrist supination      (Blank rows = not tested)  UPPER EXTREMITY MMT:  MMT Right eval Left eval  Shoulder flexion 3/5 in available range 3 limited to pain  Shoulder extension    Shoulder abduction 3- limited to pain 3- limited to pain  Shoulder adduction    Shoulder internal rotation    Shoulder external rotation    Middle trapezius 3 3  Lower trapezius    Elbow flexion 4P! 4P!  Elbow extension    Wrist flexion    Wrist extension    Wrist ulnar deviation    Wrist radial deviation    Wrist pronation    Wrist supination    Grip strength (lbs)    (Blank rows = not tested)  SHOULDER SPECIAL TESTS: Rotator cuff assessment: Empty can test: positive    PALPATION:  TTP   TODAY'S TREATMENT:                                                                                                                                          Treatment                            11/21:  PROM bil shoulders Supine wand press up 2# bar 2x10 Supine wand flexion 2# bar 2x10 Supine active flexion x15ea Seated scap squeeze x20 Seated wand flexion to 90deg  2x10 Horizontal adduction with cane  Shoulder extension with cane Bicep curls 2lb x15ea    Treatment                            11/18:  Manual: STM bil subscap paired with passive flexion, suboccipital release, cervical paraspinal STM- able to increase AROM Rt 140/Lt 110 following manual Supine shoulder flexion with  wand Horiz abd/add for chest stretch Low diag pull yellow tband- minimal tolerance today.    11/14:  -PROM bil each plane -Gentle GHJ distraction bil - Seated Scapular Retraction  3" 2x10 -supine wand chest press 1# bar 2x10 -supine wand flexion 1# bar 2x10 -Supine active flexion x10ea Seated wand flexion 1# bar x12   11/11:  -PROM bil each plane -Gentle GHJ distraction bil - Seated Scapular Retraction  3" 2x10 - Seated Shoulder Shrugs x15 -seated shoulder rolls x20 -supine wand chest press 1# bar 2x10 -supine wand flexion 1# bar 2x10 -seated bicep curls 1# 2x10 ea -Seated press out 1#R, 0#L [id number]  PATIENT EDUCATION: Education details: Discussed eval findings, rehab rationale, aquatic program progression/POC and pools in area. Patient is in agreement  Person educated: Patient Education method: Explanation Education comprehension: verbalized understanding  HOME EXERCISE PROGRAM: Access Code: [email or web address] URL: [email or web address]   ASSESSMENT:  CLINICAL IMPRESSION: Pt continues to demonstrated improved available shoulder flexoin and abd. Remains limited with rotational movements. Cued pt to remain within pain limitations. Crepitus remains present with L shoulder elevation. Will continue to progress functional ROM as tolerated.   Eval: Patient is a 77 y.o. f who was seen today for physical therapy evaluation and treatment for shoulder pain. She reports many years of discomfort receiving PT for in past without any improvement.  She has had steroid injections but did not return to ortho because she was not ready for any surgical procedure.  She presents today with limited bilateral ROM, pain and strength deficits throughout shoulders.  She has tenderness with palpation bilaterally about subacromial bursa and rotator cuff insertion.  Pain is constant and interrupts sleep. She will benefit from skilled physical therapy to improve ue movement and strength allowing for  decreased difficulty completing ADL's and decreasing pain.   OBJECTIVE IMPAIRMENTS: decreased coordination, decreased ROM, decreased strength, impaired UE functional use, and pain.   ACTIVITY LIMITATIONS: carrying, lifting, bathing, dressing, and reach over head  PARTICIPATION LIMITATIONS: meal prep, cleaning, and laundry  PERSONAL FACTORS: Age and Time since onset of injury/illness/exacerbation are also affecting patient's functional outcome.   REHAB POTENTIAL: Good  CLINICAL DECISION MAKING: Stable/uncomplicated  EVALUATION COMPLEXITY: Low  GOALS: Goals reviewed with patient? Yes  SHORT TERM GOALS: Target date: 05/27/23  Pt will be indep and consistent with HEP to demonstrate commitment to improvement  Baseline: Goal status:achieved  2.  Pt will report decreased pain with ADL's/combing hair Baseline:  Goal status: achieved  3.  Pt will report a decrease in shoulder pain by 2 NPRS to improve comfort Baseline:   LONG TERM GOALS: Target date: 06/24/23  Pt to meet stated Foto Goal of 59% Baseline: 43% Goal status: INITIAL  2.  Pt will improve shoulder flex and abd ROM by 20D Baseline: see chart Goal status: INITIAL  3.  Pt will improve shoulder and scapular strength by at least 1 grade Baseline: see chart Goal status: INITIAL  4.  Pt will report ability to reach up into cabinet with less discomfort Baseline:  Goal status: INITIAL  5.  Pt will report reduced incidence of dropping objects/improved hand strength Baseline:  Goal status: INITIAL    PLAN: PT FREQUENCY: 1-2x/week  PT DURATION: 8 weeks12 visits.  Extended to allow for scheduling conflicts  PLANNED INTERVENTIONS: Therapeutic exercises, Therapeutic activity, Neuromuscular re-education, Balance training, Gait training, Patient/Family education, Self Care, Joint mobilization, DME instructions, Aquatic Therapy, Dry Needling, Cryotherapy, Moist heat, Taping, Ionotophoresis 4mg /ml Dexamethasone, Manual  therapy, and Re-evaluation  PLAN FOR NEXT SESSION: Land based: ROM and strengthening bilat ue shoulder complex. Consider TB   Riki Altes, PTA  06/09/23 3:11 PM

## 2023-06-13 ENCOUNTER — Ambulatory Visit (HOSPITAL_BASED_OUTPATIENT_CLINIC_OR_DEPARTMENT_OTHER): Payer: Medicare Other

## 2023-06-13 ENCOUNTER — Encounter (HOSPITAL_BASED_OUTPATIENT_CLINIC_OR_DEPARTMENT_OTHER): Payer: Self-pay

## 2023-06-13 DIAGNOSIS — M6281 Muscle weakness (generalized): Secondary | ICD-10-CM

## 2023-06-13 DIAGNOSIS — M25511 Pain in right shoulder: Secondary | ICD-10-CM | POA: Diagnosis not present

## 2023-06-13 DIAGNOSIS — M25512 Pain in left shoulder: Secondary | ICD-10-CM | POA: Diagnosis not present

## 2023-06-13 NOTE — Therapy (Signed)
OUTPATIENT PHYSICAL THERAPY UPPER EXTREMITY TREATMENT   Patient Name: Eileen Hansen MRN: [id number] DOB:[birth year], 77 y.o., female Today's Date: 06/13/2023  END OF SESSION:  PT End of Session - 06/13/23 1406     Visit Number 11    Number of Visits 12    Date for PT Re-Evaluation 06/24/23    PT Start Time 1348    PT Stop Time 1430    PT Time Calculation (min) 42 min    Activity Tolerance Patient tolerated treatment well    Behavior During Therapy [facility] for tasks assessed/performed                      Past Medical History:  Diagnosis Date   Allergic rhinitis    Arthralgia 09/30/2015   Arthritis    fingers   Bruises easily and skin thin 09/11/2021   Chronic  bilateral shoulder pain left shoulder limited rom 12/07/2016   Heart murmur 12/07/2016   HTN (hypertension)    Lexiscan Myoview 04/2021    Myoview 10/22: EF 78, no ischemia or infarction; low risk   mild Aortic valve insufficiency    Mild Mitral valve regurgitation    PAD (peripheral artery disease) (HCC)    Pain in the wrist, unspecified laterality 03/03/2014   Personal history of COVID-19 04/2021   mild symptoms x 1 week all symptoms resolved   Prolapse of anterior vaginal wall    Tachycardia    Episodic   Upper back pain    Wears glasses for reading    Wears partial denture upper    Past Surgical History:  Procedure Laterality Date   ANTERIOR AND POSTERIOR REPAIR WITH SACROSPINOUS FIXATION N/A 09/14/2021   Procedure: ANTERIOR REPAIR WITH SACROSPINOUS FIXATION;  Surgeon: [name], MD;  Location: [facility] [place];  Service: Gynecology;  Laterality: N/A;   APPENDECTOMY  77 yrs old   BUNIONECTOMY Bilateral    more than 40 yrs ago   colonscopy  2018   CYSTOSCOPY N/A 09/14/2021   Procedure: CYSTOSCOPY;  Surgeon: [name], MD;  Location: [facility];  Service: Gynecology;  Laterality: N/A;   WISDOM TOOTH EXTRACTION  77 yrs old   Patient Active Problem  List   Diagnosis Date Noted   PAD (peripheral artery disease) (HCC) 08/18/2022   Myalgia 08/18/2022   Uterovaginal prolapse, incomplete 09/14/2021   Right calf pain 03/24/2021   Primary osteoarthritis of right knee 12/24/2019   Sciatica, right side 10/29/2019   RUQ pain 07/20/2018   Influenza A 07/18/2018   Senile ecchymosis 02/03/2018   Atypical chest pain 04/17/2017   Bronchitis, acute 01/09/2017   Heart murmur 12/07/2016   Chronic shoulder pain 12/07/2016   Epistaxis 09/30/2015   Tinnitus of right ear 09/30/2015   Left arm pain 09/30/2015   Left hip pain 09/30/2015   Generalized osteoarthritis of multiple sites 09/30/2015   Medicare annual wellness visit, subsequent 04/14/2015   Globus sensation 02/23/2015   Paresthesia 11/08/2014   Osteopenia 04/15/2014   Prediabetes 04/07/2014   Pain in the wrist, unspecified laterality 03/03/2014   Paresthesia of left arm 11/16/2013   Lateral epicondylitis of left elbow 04/19/2013   Hematochezia 04/19/2013   DDD (degenerative disc disease), cervical 05/16/2012   Preventative health care 03/31/2011   JOINT PAIN, HAND 07/23/2010   DISTURBANCE OF SKIN SENSATION 07/04/2009   Hyperlipidemia, mixed 07/04/2009   Constipation 12/02/2008   TENESMUS 12/02/2008   Tachycardia 11/07/2007   Essential hypertension 06/01/2007  ALLERGIC RHINITIS 06/01/2007    PCP: [name], [name], MD  REFERRING PROVIDER: [name], [name], MD   REFERRING DIAG: Bilateral shoulder pain, unspecified chronicity   THERAPY DIAG:  Bilateral shoulder pain, unspecified chronicity  Muscle weakness (generalized)  Rationale for Evaluation and Treatment: Rehabilitation  ONSET DATE: 7 yrs  SUBJECTIVE:                                                                                                                                                                                      SUBJECTIVE STATEMENT: Pt reports increased L shoulder pain following last session. Has  resolved since. "I think we over did it."  Hand dominance: Right  PERTINENT HISTORY: As per md: She has received shoulder injections from orthopedics in the past. Her condition has worsened since her last visit in January. (PCP)  PAIN:  Are you having pain? Yes: NPRS scale: 2-3/10 Pain location: bilat shoulders Pain description: constant ache Aggravating factors: reaching over head, any shoulder movement Relieving factors: resting not moving  PRECAUTIONS: None  RED FLAGS: None   WEIGHT BEARING RESTRICTIONS: No  FALLS:  Has patient fallen in last 6 months? No  LIVING ENVIRONMENT: Lives with: lives with their family and lives with their spouse Lives in: House/apartment   OCCUPATION: Retired school teachers  PLOF: Independent  PATIENT GOALS: Be able to sleep without pain when I move.  NEXT MD VISIT: as needed  OBJECTIVE:  Note: Objective measures were completed at Evaluation unless otherwise noted.  DIAGNOSTIC FINDINGS:  Feb 2024 IMPRESSION: This MRI of the cervical spine without contrast shows the following: The spinal cord appears normal. At C3-C4, there is central disc protrusion and mild uncovertebral spurring causing mild spinal stenosis but no nerve root compression.  The degenerative changes have progressed compared to the 2013 MRI. At C4-C5, there are stable degenerative changes causing mild spinal stenosis and moderate left foraminal narrowing encroaching on the left C5 nerve root without causing definite compression. At C5-C6 there are degenerative changes but no spinal stenosis or nerve root compression. At C6-C7, degenerative changes cause moderate right foraminal narrowing encroaching upon the right C6 nerve root without causing definite nerve root compression.  The degenerative changes have progressed compared to 2013.  PATIENT SURVEYS :  FOTO Primary score 43% with goal of 59%  COGNITION: Overall cognitive status: Within functional limits for tasks  assessed     SENSATION: WFL  POSTURE: Forward head and rounded shoulders  UPPER EXTREMITY ROM:   Active ROM Right eval Left eval Rt/Lt 10/28 Rt/Lt 11/18  Shoulder flexion 109 105 120/114 130/100  Shoulder extension 41 24    Shoulder abduction 83 69  Shoulder adduction      Shoulder internal rotation s1 Mid glut    Shoulder external rotation c7 c7    Elbow flexion      Elbow extension      Wrist flexion      Wrist extension      Wrist ulnar deviation      Wrist radial deviation      Wrist pronation      Wrist supination      (Blank rows = not tested)  UPPER EXTREMITY MMT:  MMT Right eval Left eval  Shoulder flexion 3/5 in available range 3 limited to pain  Shoulder extension    Shoulder abduction 3- limited to pain 3- limited to pain  Shoulder adduction    Shoulder internal rotation    Shoulder external rotation    Middle trapezius 3 3  Lower trapezius    Elbow flexion 4P! 4P!  Elbow extension    Wrist flexion    Wrist extension    Wrist ulnar deviation    Wrist radial deviation    Wrist pronation    Wrist supination    Grip strength (lbs)    (Blank rows = not tested)  SHOULDER SPECIAL TESTS: Rotator cuff assessment: Empty can test: positive    PALPATION:  TTP   TODAY'S TREATMENT:                                                                                                                                           Treatment                            11/25:  PROM bil shoulders Supine wand press up 1# bar 2x10 Supine wand flexion 1# bar 2x10 Seated scap squeeze x20 Seated active shoulder flexion x10ea Chicken wing x10ea Seated press out x10ea Bicep curls 2lb x20 R, 1# x8L (stopped due to crepitus)  Treatment                            11/21:  PROM bil shoulders Supine wand press up 2# bar 2x10 Supine wand flexion 2# bar 2x10 Supine active flexion x15ea Seated scap squeeze x20 Seated wand flexion to 90deg  2x10 Horizontal adduction  with cane  Shoulder extension with cane Bicep curls 2lb x15ea    Treatment                            11/18:  Manual: STM bil subscap paired with passive flexion, suboccipital release, cervical paraspinal STM- able to increase AROM Rt 140/Lt 110 following manual Supine shoulder flexion with wand Horiz abd/add for chest stretch Low diag pull yellow tband- minimal tolerance today.    11/14:  -PROM bil each plane -Gentle GHJ distraction bil -  Seated Scapular Retraction  3" 2x10 -supine wand chest press 1# bar 2x10 -supine wand flexion 1# bar 2x10 -Supine active flexion x10ea Seated wand flexion 1# bar x12   11/11:  -PROM bil each plane -Gentle GHJ distraction bil - Seated Scapular Retraction  3" 2x10 - Seated Shoulder Shrugs x15 -seated shoulder rolls x20 -supine wand chest press 1# bar 2x10 -supine wand flexion 1# bar 2x10 -seated bicep curls 1# 2x10 ea -Seated press out 1#R, 0#L [id number]  PATIENT EDUCATION: Education details: Discussed eval findings, rehab rationale, aquatic program progression/POC and pools in area. Patient is in agreement  Person educated: Patient Education method: Explanation Education comprehension: verbalized understanding  HOME EXERCISE PROGRAM: Access Code: [email or web address] URL: [email or web address]   ASSESSMENT:  CLINICAL IMPRESSION: Regressed with strengthening exercises from last visit to reduce soreness/pain after PT. She was cued to remain within pain limits with exercises. She is more limited with L LE reaching/elevation. Will monitor her progress level and symptoms.   Eval: Patient is a 77 y.o. f who was seen today for physical therapy evaluation and treatment for shoulder pain. She reports many years of discomfort receiving PT for in past without any improvement.  She has had steroid injections but did not return to ortho because she was not ready for any surgical procedure.  She presents today with limited bilateral ROM, pain  and strength deficits throughout shoulders.  She has tenderness with palpation bilaterally about subacromial bursa and rotator cuff insertion.  Pain is constant and interrupts sleep. She will benefit from skilled physical therapy to improve ue movement and strength allowing for decreased difficulty completing ADL's and decreasing pain.   OBJECTIVE IMPAIRMENTS: decreased coordination, decreased ROM, decreased strength, impaired UE functional use, and pain.   ACTIVITY LIMITATIONS: carrying, lifting, bathing, dressing, and reach over head  PARTICIPATION LIMITATIONS: meal prep, cleaning, and laundry  PERSONAL FACTORS: Age and Time since onset of injury/illness/exacerbation are also affecting patient's functional outcome.   REHAB POTENTIAL: Good  CLINICAL DECISION MAKING: Stable/uncomplicated  EVALUATION COMPLEXITY: Low  GOALS: Goals reviewed with patient? Yes  SHORT TERM GOALS: Target date: 05/27/23  Pt will be indep and consistent with HEP to demonstrate commitment to improvement  Baseline: Goal status:achieved  2.  Pt will report decreased pain with ADL's/combing hair Baseline:  Goal status: achieved  3.  Pt will report a decrease in shoulder pain by 2 NPRS to improve comfort Baseline:   LONG TERM GOALS: Target date: 06/24/23  Pt to meet stated Foto Goal of 59% Baseline: 43% Goal status: INITIAL  2.  Pt will improve shoulder flex and abd ROM by 20D Baseline: see chart Goal status: INITIAL  3.  Pt will improve shoulder and scapular strength by at least 1 grade Baseline: see chart Goal status: INITIAL  4.  Pt will report ability to reach up into cabinet with less discomfort Baseline:  Goal status: INITIAL  5.  Pt will report reduced incidence of dropping objects/improved hand strength Baseline:  Goal status: INITIAL    PLAN: PT FREQUENCY: 1-2x/week  PT DURATION: 8 weeks12 visits.  Extended to allow for scheduling conflicts  PLANNED INTERVENTIONS: Therapeutic  exercises, Therapeutic activity, Neuromuscular re-education, Balance training, Gait training, Patient/Family education, Self Care, Joint mobilization, DME instructions, Aquatic Therapy, Dry Needling, Cryotherapy, Moist heat, Taping, Ionotophoresis 4mg /ml Dexamethasone, Manual therapy, and Re-evaluation  PLAN FOR NEXT SESSION: Land based: ROM and strengthening bilat ue shoulder complex. Consider TB   Riki Altes, PTA  06/13/23 2:57 PM

## 2023-06-15 ENCOUNTER — Ambulatory Visit (HOSPITAL_BASED_OUTPATIENT_CLINIC_OR_DEPARTMENT_OTHER): Payer: Medicare Other | Admitting: Physical Therapy

## 2023-06-15 DIAGNOSIS — M25511 Pain in right shoulder: Secondary | ICD-10-CM | POA: Diagnosis not present

## 2023-06-15 DIAGNOSIS — E785 Hyperlipidemia, unspecified: Secondary | ICD-10-CM | POA: Diagnosis not present

## 2023-06-15 DIAGNOSIS — M6281 Muscle weakness (generalized): Secondary | ICD-10-CM

## 2023-06-15 DIAGNOSIS — M25512 Pain in left shoulder: Secondary | ICD-10-CM | POA: Diagnosis not present

## 2023-06-15 NOTE — Therapy (Signed)
OUTPATIENT PHYSICAL THERAPY UPPER EXTREMITY TREATMENT   Patient Name: Eileen Hansen MRN: [id number] DOB:10/29/1945, 77 y.o., female Today's Date: 06/15/2023  END OF SESSION:  PT End of Session - 06/15/23 1307     Visit Number 12    Number of Visits 24    Date for PT Re-Evaluation 07/27/23    PT Start Time 1100    PT Stop Time 1143    PT Time Calculation (min) 43 min    Activity Tolerance Patient tolerated treatment well    Behavior During Therapy [facility] for tasks assessed/performed                       Past Medical History:  Diagnosis Date   Allergic rhinitis    Arthralgia 09/30/2015   Arthritis    fingers   Bruises easily and skin thin 09/11/2021   Chronic  bilateral shoulder pain left shoulder limited rom 12/07/2016   Heart murmur 12/07/2016   HTN (hypertension)    Lexiscan Myoview 04/2021    Myoview 10/22: EF 78, no ischemia or infarction; low risk   mild Aortic valve insufficiency    Mild Mitral valve regurgitation    PAD (peripheral artery disease) (HCC)    Pain in the wrist, unspecified laterality 03/03/2014   Personal history of COVID-19 04/2021   mild symptoms x 1 week all symptoms resolved   Prolapse of anterior vaginal wall    Tachycardia    Episodic   Upper back pain    Wears glasses for reading    Wears partial denture upper    Past Surgical History:  Procedure Laterality Date   ANTERIOR AND POSTERIOR REPAIR WITH SACROSPINOUS FIXATION N/A 09/14/2021   Procedure: ANTERIOR REPAIR WITH SACROSPINOUS FIXATION;  Surgeon: [name], MD;  Location: [facility];  Service: Gynecology;  Laterality: N/A;   APPENDECTOMY  77 yrs old   BUNIONECTOMY Bilateral    more than 40 yrs ago   colonscopy  2018   CYSTOSCOPY N/A 09/14/2021   Procedure: CYSTOSCOPY;  Surgeon: [name], MD;  Location: [facility];  Service: Gynecology;  Laterality: N/A;   WISDOM TOOTH EXTRACTION  77 yrs old   Patient Active  Problem List   Diagnosis Date Noted   PAD (peripheral artery disease) (HCC) 08/18/2022   Myalgia 08/18/2022   Uterovaginal prolapse, incomplete 09/14/2021   Right calf pain 03/24/2021   Primary osteoarthritis of right knee 12/24/2019   Sciatica, right side 10/29/2019   RUQ pain 07/20/2018   Influenza A 07/18/2018   Senile ecchymosis 02/03/2018   Atypical chest pain 04/17/2017   Bronchitis, acute 01/09/2017   Heart murmur 12/07/2016   Chronic shoulder pain 12/07/2016   Epistaxis 09/30/2015   Tinnitus of right ear 09/30/2015   Left arm pain 09/30/2015   Left hip pain 09/30/2015   Generalized osteoarthritis of multiple sites 09/30/2015   Medicare annual wellness visit, subsequent 04/14/2015   Globus sensation 02/23/2015   Paresthesia 11/08/2014   Osteopenia 04/15/2014   Prediabetes 04/07/2014   Pain in the wrist, unspecified laterality 03/03/2014   Paresthesia of left arm 11/16/2013   Lateral epicondylitis of left elbow 04/19/2013   Hematochezia 04/19/2013   DDD (degenerative disc disease), cervical 05/16/2012   Preventative health care 03/31/2011   JOINT PAIN, HAND 07/23/2010   DISTURBANCE OF SKIN SENSATION 07/04/2009   Hyperlipidemia, mixed 07/04/2009   Constipation 12/02/2008   TENESMUS 12/02/2008   Tachycardia 11/07/2007   Essential hypertension 06/01/2007  ALLERGIC RHINITIS 06/01/2007    PCP: [name], [name], MD  REFERRING PROVIDER: [name], [name], MD   REFERRING DIAG: Bilateral shoulder pain, unspecified chronicity   THERAPY DIAG:  Bilateral shoulder pain, unspecified chronicity  Muscle weakness (generalized)  Rationale for Evaluation and Treatment: Rehabilitation  ONSET DATE: 7 yrs  SUBJECTIVE:                                                                                                                                                                                      SUBJECTIVE STATEMENT: Patient reports overall she feels like her shoulder is  improving.  She is able to reach overhead with less pain.  She continues to have significant crepitus.  She also is having difficulty reaching behind her back.  She feels like her shoulder is getting stronger.  Hand dominance: Right  PERTINENT HISTORY: As per md: She has received shoulder injections from orthopedics in the past. Her condition has worsened since her last visit in January. (PCP)  PAIN:  Are you having pain? Yes: NPRS scale: 2-3/10 Pain location: bilat shoulders Pain description: constant ache Aggravating factors: reaching over head, any shoulder movement Relieving factors: resting not moving  PRECAUTIONS: None  RED FLAGS: None   WEIGHT BEARING RESTRICTIONS: No  FALLS:  Has patient fallen in last 6 months? No  LIVING ENVIRONMENT: Lives with: lives with their family and lives with their spouse Lives in: House/apartment   OCCUPATION: Retired school teachers  PLOF: Independent  PATIENT GOALS: Be able to sleep without pain when I move.  NEXT MD VISIT: as needed  OBJECTIVE:  Note: Objective measures were completed at Evaluation unless otherwise noted.  DIAGNOSTIC FINDINGS:  Feb 2024 IMPRESSION: This MRI of the cervical spine without contrast shows the following: The spinal cord appears normal. At C3-C4, there is central disc protrusion and mild uncovertebral spurring causing mild spinal stenosis but no nerve root compression.  The degenerative changes have progressed compared to the 2013 MRI. At C4-C5, there are stable degenerative changes causing mild spinal stenosis and moderate left foraminal narrowing encroaching on the left C5 nerve root without causing definite compression. At C5-C6 there are degenerative changes but no spinal stenosis or nerve root compression. At C6-C7, degenerative changes cause moderate right foraminal narrowing encroaching upon the right C6 nerve root without causing definite nerve root compression.  The degenerative changes have  progressed compared to 2013.  PATIENT SURVEYS :  FOTO Primary score 43% with goal of 59%  COGNITION: Overall cognitive status: Within functional limits for tasks assessed     SENSATION: WFL  POSTURE: Forward head and rounded shoulders  UPPER EXTREMITY ROM:   Active  ROM Right eval Left eval Rt/Lt 10/28 Rt/Lt 11/18 Right  Left   Shoulder flexion 109 105 120/114 130/100 132 118  Shoulder extension 41 24      Shoulder abduction 83 69      Shoulder adduction        Shoulder internal rotation s1 Mid glut   Mid glut  Lateral hip   Shoulder external rotation c7 c7   Occiput  c4  Elbow flexion        Elbow extension        Wrist flexion        Wrist extension        Wrist ulnar deviation        Wrist radial deviation        Wrist pronation        Wrist supination        (Blank rows = not tested)  UPPER EXTREMITY MMT:  MMT Right eval Left eval Right  Left   Shoulder flexion 3/5 in available range 3 limited to pain 4 3+  Shoulder extension      Shoulder abduction 3- limited to pain 3- limited to pain    Shoulder adduction      Shoulder internal rotation   4 3+  Shoulder external rotation   3+ 3  Middle trapezius 3 3    Lower trapezius      Elbow flexion 4P! 4P!    Elbow extension      Wrist flexion      Wrist extension      Wrist ulnar deviation      Wrist radial deviation      Wrist pronation      Wrist supination      Grip strength (lbs)      (Blank rows = not tested)  SHOULDER SPECIAL TESTS: Rotator cuff assessment: Empty can test: positive    PALPATION:  TTP   TODAY'S TREATMENT:                                                                                                                                         11/27  Manual: PROM into flexion and ER; reviewed trigger points with the patient and self trigger points   Supine: Wand flexion 2x10  Wand press 2x10   Seated:  Scap squeezes x20  Chicken wing x20  Biceps curl 2 lb bar 2x10    Reviewed tests and measures.  Reviewed Foto score.   Treatment                            11/25:  PROM bil shoulders Supine wand press up 1# bar 2x10 Supine wand flexion 1# bar 2x10 Seated scap squeeze x20 Seated active shoulder flexion x10ea Chicken wing x10ea Seated press out x10ea Bicep curls 2lb x20 R, 1# x8L (stopped due to crepitus)  Treatment                            11/21:  PROM bil shoulders Supine wand press up 2# bar 2x10 Supine wand flexion 2# bar 2x10 Supine active flexion x15ea Seated scap squeeze x20 Seated wand flexion to 90deg  2x10 Horizontal adduction with cane  Shoulder extension with cane Bicep curls 2lb x15ea    Treatment                            11/18:  Manual: STM bil subscap paired with passive flexion, suboccipital release, cervical paraspinal STM- able to increase AROM Rt 140/Lt 110 following manual Supine shoulder flexion with wand Horiz abd/add for chest stretch Low diag pull yellow tband- minimal tolerance today.    11/14:  -PROM bil each plane -Gentle GHJ distraction bil - Seated Scapular Retraction  3" 2x10 -supine wand chest press 1# bar 2x10 -supine wand flexion 1# bar 2x10 -Supine active flexion x10ea Seated wand flexion 1# bar x12   11/11:  -PROM bil each plane -Gentle GHJ distraction bil - Seated Scapular Retraction  3" 2x10 - Seated Shoulder Shrugs x15 -seated shoulder rolls x20 -supine wand chest press 1# bar 2x10 -supine wand flexion 1# bar 2x10 -seated bicep curls 1# 2x10 ea -Seated press out 1#R, 0#L [id number]  PATIENT EDUCATION: Education details: Discussed eval findings, rehab rationale, aquatic program progression/POC and pools in area. Patient is in agreement  Person educated: Patient Education method: Explanation Education comprehension: verbalized understanding  HOME EXERCISE PROGRAM: Access Code: [email or web address] URL: [email or web address]   ASSESSMENT:  CLINICAL IMPRESSION: The patient  is progressing well. Her FOTO score is higher then her goal. Her range has improved. Her strength is improving. She has met several of her goals. She continues to have difficulty reaching in her back pocket. We will continue 2W6 to work on improving strength in her new ranges and improve her funtionsal IR. She feels like at this time she is having improved ability to reach forward and use her arm for ADLs.   Eval: Patient is a 77 y.o. f who was seen today for physical therapy evaluation and treatment for shoulder pain. She reports many years of discomfort receiving PT for in past without any improvement.  She has had steroid injections but did not return to ortho because she was not ready for any surgical procedure.  She presents today with limited bilateral ROM, pain and strength deficits throughout shoulders.  She has tenderness with palpation bilaterally about subacromial bursa and rotator cuff insertion.  Pain is constant and interrupts sleep. She will benefit from skilled physical therapy to improve ue movement and strength allowing for decreased difficulty completing ADL's and decreasing pain.   OBJECTIVE IMPAIRMENTS: decreased coordination, decreased ROM, decreased strength, impaired UE functional use, and pain.   ACTIVITY LIMITATIONS: carrying, lifting, bathing, dressing, and reach over head  PARTICIPATION LIMITATIONS: meal prep, cleaning, and laundry  PERSONAL FACTORS: Age and Time since onset of injury/illness/exacerbation are also affecting patient's functional outcome.   REHAB POTENTIAL: Good  CLINICAL DECISION MAKING: Stable/uncomplicated  EVALUATION COMPLEXITY: Low  GOALS: Goals reviewed with patient? Yes  SHORT TERM GOALS: Target date: 05/27/23  Pt will be indep and consistent with HEP to demonstrate commitment to improvement  Baseline: Goal status:achieved 11/2 8  2.  Pt will report decreased pain with ADL's/combing hair Baseline:  Goal status: achieved 11/2 8  3.  Pt  will report a decrease in shoulder pain by 2 NPRS to improve comfort Baseline: Pain can fluctuate above 08/1009/8  LONG TERM GOALS: Target date: 06/24/23  Pt to meet stated Foto Goal of 59% Baseline: 43% Goal status: Has achieved Foto goal 11/30  2.  Pt will improve shoulder flex and abd ROM by 20D Baseline: see chart Goal status: Improved 10 degrees 11/28  3.  Pt will improve shoulder and scapular strength by at least 1 grade Baseline: see chart Goal status: Still limited with external rotation and flexion 11/28  4.  Pt will report ability to reach up into cabinet with less discomfort Baseline:  Goal status: Improving mobility 11/28  5.  Pt will report reduced incidence of dropping objects/improved hand strength Baseline:  Goal status: Improving 11/28    PLAN: PT FREQUENCY: 1-2x/week  PT DURATION: 8 weeks12 visits.  Extended to allow for scheduling conflicts  PLANNED INTERVENTIONS: Therapeutic exercises, Therapeutic activity, Neuromuscular re-education, Balance training, Gait training, Patient/Family education, Self Care, Joint mobilization, DME instructions, Aquatic Therapy, Dry Needling, Cryotherapy, Moist heat, Taping, Ionotophoresis 4mg /ml Dexamethasone, Manual therapy, and Re-evaluation  PLAN FOR NEXT SESSION: Land based: ROM and strengthening bilat ue shoulder complex. Consider TB  Lorayne Bender, PT, DPT 06/15/23 1:08 PM

## 2023-06-16 LAB — LIPID PANEL
Chol/HDL Ratio: 7.1 {ratio} — ABNORMAL HIGH (ref 0.0–4.4)
Cholesterol, Total: 226 mg/dL — ABNORMAL HIGH (ref 100–199)
HDL: 32 mg/dL — ABNORMAL LOW (ref >39)
LDL Chol Calc (NIH): 165 mg/dL — ABNORMAL HIGH (ref 0–99)
Triglycerides: 156 mg/dL — ABNORMAL HIGH (ref 0–149)
VLDL Cholesterol Cal: 29 mg/dL (ref 5–40)

## 2023-06-16 LAB — HEPATIC FUNCTION PANEL
ALT: 15 [IU]/L (ref 0–32)
AST: 17 [IU]/L (ref 0–40)
Albumin: 4.4 g/dL (ref 3.8–4.8)
Alkaline Phosphatase: 72 [IU]/L (ref 44–121)
Bilirubin Total: 0.5 mg/dL (ref 0.0–1.2)
Bilirubin, Direct: 0.14 mg/dL (ref 0.00–0.40)
Total Protein: 7 g/dL (ref 6.0–8.5)

## 2023-06-18 ENCOUNTER — Encounter (HOSPITAL_BASED_OUTPATIENT_CLINIC_OR_DEPARTMENT_OTHER): Payer: Self-pay | Admitting: Physical Therapy

## 2023-06-20 ENCOUNTER — Other Ambulatory Visit: Payer: Self-pay | Admitting: Family Medicine

## 2023-06-22 ENCOUNTER — Ambulatory Visit (INDEPENDENT_AMBULATORY_CARE_PROVIDER_SITE_OTHER): Payer: Medicare Other | Admitting: Family Medicine

## 2023-06-22 ENCOUNTER — Ambulatory Visit: Payer: Medicare Other

## 2023-06-22 ENCOUNTER — Encounter: Payer: Self-pay | Admitting: Family Medicine

## 2023-06-22 VITALS — BP 128/80 | HR 70 | Resp 16 | Ht 59.0 in | Wt 115.0 lb

## 2023-06-22 DIAGNOSIS — R053 Chronic cough: Secondary | ICD-10-CM | POA: Diagnosis not present

## 2023-06-22 DIAGNOSIS — I10 Essential (primary) hypertension: Secondary | ICD-10-CM | POA: Diagnosis not present

## 2023-06-22 DIAGNOSIS — E782 Mixed hyperlipidemia: Secondary | ICD-10-CM

## 2023-06-22 DIAGNOSIS — R059 Cough, unspecified: Secondary | ICD-10-CM | POA: Diagnosis not present

## 2023-06-22 NOTE — Patient Instructions (Addendum)
A few things to remember from today's visit:  Hyperlipidemia, mixed  Cough, persistent - Plan: DG Chest 2 View  Plain mucinex (Guaifenesin) daily and adequate hydration. PCSK-9 are very effective to decrease cholesterol. ezetimibe and bempedoic acid es otra opcion.  If you need refills for medications you take chronically, please call your pharmacy. Do not use My Chart to request refills or for acute issues that need immediate attention. If you send a my chart message, it may take a few days to be addressed, specially if I am not in the office.  Please be sure medication list is accurate. If a new problem present, please set up appointment sooner than planned today.

## 2023-06-22 NOTE — Progress Notes (Signed)
Chief Complaint  Patient presents with   Cough   Breathing Problem   Follow-up   HPI: [name] is a 77 y.o. female with a PMHx significant for HTN, PAD, OA, chronic pain, HLD, and prediabeteshere today for cough, breathing problems, and HLD management.  Last seen on 03/16/2023  Breathing difficulty/cough:  Patient complains of difficulty taking deep breath and cough for 3-4 weeks.  She believes the problem is mucus stuck in her throat, postnasal drainage, and intermittent wheezing at night.  She says the problem is the same since its onset, and worsens when she is lying down.  Negative for associated CP, palpitations,orthopnea,,PND, or edema.  She hasn't tried anything OTC yet because she was worried about interactions with her other medications.  She denies fever,nasal congestion, headache, fever, chills, or heartburn.   Hyperlipidemia and PAD: She recent had FLP done. Currently on atorvastatin 10 mg daily.  Side effects from medication: body aches. She tried Rosuvastatin in the apst, caused similar side effects.  She says she is eating healthy in general.  She last saw her cardiologist on 9/10.  Her cardiologist has recommended treatment with PCSK9 inh, concerned about possible side effects. She has not tried other agents like Zetia or bempedoic acid. She follows low fat diet.  Lab Results  Component Value Date   CHOL 226 (H) 06/15/2023   HDL 32 (L) 06/15/2023   LDLCALC 165 (H) 06/15/2023   LDLDIRECT 152.0 06/25/2019   TRIG 156 (H) 06/15/2023   CHOLHDL 7.1 (H) 06/15/2023   HTN on Carvedilol 12.5 mg bid, Lisinopril 20 mg daily, and hydrochlorothiazide 25 mg daily.  Lab Results  Component Value Date   NA 140 03/16/2023   CL 103 03/16/2023   K 4.1 03/16/2023   CO2 30 03/16/2023   BUN 20 03/16/2023   CREATININE 0.67 03/16/2023   GFR 83.36 03/16/2023   CALCIUM 10.1 03/16/2023   ALBUMIN 4.4 06/15/2023   GLUCOSE 116 (H) 03/16/2023   Review of Systems   Constitutional:  Negative for activity change, appetite change, chills and fever.  HENT:  Negative for mouth sores, nosebleeds and trouble swallowing.   Eyes:  Negative for redness and visual disturbance.  Cardiovascular:  Negative for palpitations and leg swelling.  Gastrointestinal:  Negative for abdominal pain, nausea and vomiting.       Negative for changes in bowel habits.  Genitourinary:  Negative for decreased urine volume and hematuria.  Musculoskeletal:  Positive for arthralgias and myalgias. Negative for joint swelling.  Skin:  Negative for rash.  Neurological:  Negative for syncope, weakness and headaches.  Psychiatric/Behavioral:  Negative for confusion and hallucinations.   See other pertinent positives and negatives in HPI.  Current Outpatient Medications on File Prior to Visit  Medication Sig Dispense Refill   Ascorbic Acid (VITAMIN C) 1000 MG tablet Take 1,000 mg by mouth daily.     aspirin EC 81 MG tablet Take 81 mg by mouth daily. Swallow whole.     atorvastatin (LIPITOR) 10 MG tablet Take 1 tablet (10 mg total) by mouth daily. 90 tablet 3   carvedilol (COREG) 12.5 MG tablet Take 1 tablet (12.5 mg total) by mouth 2 (two) times daily. 180 tablet 2   Cholecalciferol (VITAMIN D3) 25 MCG (1000 UT) CAPS Take 1 capsule by mouth daily.     glucosamine-chondroitin 500-400 MG tablet Take 1 tablet by mouth 3 (three) times daily.     hydrochlorothiazide (HYDRODIURIL) 25 MG tablet Take 1 tablet (25 mg total)  by mouth daily. 90 tablet 2   lisinopril (ZESTRIL) 20 MG tablet Take 1 tablet (20 mg total) by mouth 2 (two) times daily. 180 tablet 2   Magnesium 200 MG TABS Take 1 tablet (200 mg total) by mouth at bedtime. 30 tablet 11   omega-3 acid ethyl esters (LOVAZA) 1 g capsule TAKE 1 CAPSULE TWICE A DAY 180 capsule 3   vitamin B-12 (CYANOCOBALAMIN) 1000 MCG tablet Take 1,000 mcg by mouth daily.     No current facility-administered medications on file prior to visit.    Past Medical  History:  Diagnosis Date   Allergic rhinitis    Arthralgia 09/30/2015   Arthritis    fingers   Bruises easily and skin thin 09/11/2021   Chronic  bilateral shoulder pain left shoulder limited rom 12/07/2016   Heart murmur 12/07/2016   HTN (hypertension)    Lexiscan Myoview 04/2021    Myoview 10/22: EF 78, no ischemia or infarction; low risk   mild Aortic valve insufficiency    Mild Mitral valve regurgitation    PAD (peripheral artery disease) (HCC)    Pain in the wrist, unspecified laterality 03/03/2014   Personal history of COVID-19 04/2021   mild symptoms x 1 week all symptoms resolved   Prolapse of anterior vaginal wall    Tachycardia    Episodic   Upper back pain    Wears glasses for reading    Wears partial denture upper    Allergies  Allergen Reactions   Fenofibrate Other (See Comments)    Abdominal pain   Penicillins Rash    REACTION: Hives Anaphylaxis    Social History   Socioeconomic History   Marital status: Married    Spouse name: Not on file   Number of children: 1   Years of education: Not on file   Highest education level: Not on file  Occupational History   Occupation: Homemaker    Employer: RETIRED  Tobacco Use   Smoking status: Never   Smokeless tobacco: Never  Vaping Use   Vaping status: Never Used  Substance and Sexual Activity   Alcohol use: Yes    Comment:  occ   Drug use: Never   Sexual activity: Not Currently    Comment: lives with husband  Other Topics Concern   Not on file  Social History Narrative   University - [place], Masters Degree - counseling, [employer]   Married '69 - 7 years, divorced; married '82   21 daughter - '72; 2 grandchildren      Regular Exercise -  YES         Social Determinants of Health   Financial Resource Strain: Low Risk  (02/28/2023)   Overall Financial Resource Strain (CARDIA)    Difficulty of Paying Living Expenses: Not hard at all  Food Insecurity: No Food Insecurity (02/28/2023)    Hunger Vital Sign    Worried About Running Out of Food in the Last Year: Never true    Ran Out of Food in the Last Year: Never true  Transportation Needs: No Transportation Needs (02/28/2023)   PRAPARE - Administrator, Civil Service (Medical): No    Lack of Transportation (Non-Medical): No  Physical Activity: Inactive (02/28/2023)   Exercise Vital Sign    Days of Exercise per Week: 0 days    Minutes of Exercise per Session: 0 min  Stress: No Stress Concern Present (02/28/2023)   [employer] of Occupational Health - Occupational Stress Questionnaire  Feeling of Stress : Not at all  Social Connections: Moderately Isolated (02/28/2023)   Social Connection and Isolation Panel [NHANES]    Frequency of Communication with Friends and Family: More than three times a week    Frequency of Social Gatherings with Friends and Family: More than three times a week    Attends Religious Services: Never    Database administrator or Organizations: No    Attends Banker Meetings: Never    Marital Status: Married   Today's Vitals   06/22/23 1214  BP: 128/80  Pulse: 70  Resp: 16  SpO2: 96%  Weight: 115 lb (52.2 kg)  Height: 4\' 11"  (1.499 m)   Body mass index is 23.23 kg/m.  Physical Exam Vitals and nursing note reviewed.  Constitutional:      General: She is not in acute distress.    Appearance: She is well-developed. She is not ill-appearing.  HENT:     Head: Normocephalic and atraumatic.     Nose: No congestion or rhinorrhea.     Comments: Postnasal drainage.    Mouth/Throat:     Mouth: Mucous membranes are moist.     Pharynx: Oropharynx is clear. Uvula midline.  Eyes:     Conjunctiva/sclera: Conjunctivae normal.  Cardiovascular:     Rate and Rhythm: Normal rate and regular rhythm.     Heart sounds: Murmur (Soft SEM RUSB) heard.     Comments: DP pulses palpable. Pulmonary:     Effort: Pulmonary effort is normal. No respiratory distress.     Breath  sounds: Normal breath sounds. No stridor.  Abdominal:     Palpations: There is no hepatomegaly or mass.     Tenderness: There is no abdominal tenderness.  Musculoskeletal:     Cervical back: No edema or erythema. No muscular tenderness.     Right lower leg: No edema.     Left lower leg: No edema.  Lymphadenopathy:     Head:     Right side of head: No submandibular adenopathy.     Left side of head: No submandibular adenopathy.     Cervical: No cervical adenopathy.  Skin:    General: Skin is warm.     Findings: No erythema or rash.  Neurological:     Mental Status: She is alert and oriented to person, place, and time.  Psychiatric:        Mood and Affect: Mood and affect normal.   ASSESSMENT AND PLAN:  Ms. Guldan was seen today for cough, breathing problems, and HLD management.   Hyperlipidemia, mixed LDL is not at goal, last one in 05/2023 was 165. She has not tolerated Rosuvastatin in the past, having rom myalgias with Atorvastatin. Other options are Zetia and Bempedoic acid but LDL is far from goal. I think PCSK-9 is a very good options, explained that most of the time is well tolerated. Following with cardiologist.  Cough, persistent I do not appreciate wheezing, lung auscultation negative today. We discussed possible etiologies, it is going up for 3-4 weeks ,so CXR recommended. ? Post viral, allergies,GI,and meds to be considered as possible causes. If persistent, we may need to change Lisinopril for a different agent like ARB. Guaifenesin may help. Adequate hydration.  -     DG Chest 2 View; Future  Essential hypertension BP adequately controlled. Continue Carvedilol 12.5 mg bid, hydrochlorothiazide 25 mg daily,and Lisinopril 20 mg daily.  I spent a total of 39 minutes in both face to face and non  face to face activities for this visit on the date of this encounter. During this time history was obtained and documented, examination was performed, prior labs reviewed,  and assessment/plan discussed.  Return if symptoms worsen or fail to improve.   I, Suanne Marker, acting as a scribe for Reynald Woods [name], MD., have documented all relevant documentation on the behalf of Quetzally Callas [name], MD, as directed by  Faust Thorington [name], MD while in the presence of Tonee Silverstein [name], MD.   I, Suanne Marker, have reviewed all documentation for this visit. The documentation on 06/22/23 for the exam, diagnosis, procedures, and orders are all accurate and complete.  Izamar Linden G. [name], MD  [facility]. [place] office.

## 2023-06-24 ENCOUNTER — Ambulatory Visit (HOSPITAL_BASED_OUTPATIENT_CLINIC_OR_DEPARTMENT_OTHER): Payer: Medicare Other | Attending: Family Medicine

## 2023-06-24 ENCOUNTER — Encounter (HOSPITAL_BASED_OUTPATIENT_CLINIC_OR_DEPARTMENT_OTHER): Payer: Self-pay

## 2023-06-24 DIAGNOSIS — M6281 Muscle weakness (generalized): Secondary | ICD-10-CM | POA: Insufficient documentation

## 2023-06-24 DIAGNOSIS — M25511 Pain in right shoulder: Secondary | ICD-10-CM | POA: Diagnosis not present

## 2023-06-24 DIAGNOSIS — M25512 Pain in left shoulder: Secondary | ICD-10-CM | POA: Diagnosis not present

## 2023-06-24 NOTE — Therapy (Signed)
OUTPATIENT PHYSICAL THERAPY UPPER EXTREMITY TREATMENT   Patient Name: Eileen Hansen MRN: [id number] DOB:02/21/1946, 77 y.o., female Today's Date: 06/24/2023  END OF SESSION:  PT End of Session - 06/24/23 1138     Visit Number 13    Number of Visits 24    Date for PT Re-Evaluation 07/27/23    PT Start Time 1108    PT Stop Time 1147    PT Time Calculation (min) 39 min    Activity Tolerance Patient tolerated treatment well    Behavior During Therapy [facility] for tasks assessed/performed                        Past Medical History:  Diagnosis Date   Allergic rhinitis    Arthralgia 09/30/2015   Arthritis    fingers   Bruises easily and skin thin 09/11/2021   Chronic  bilateral shoulder pain left shoulder limited rom 12/07/2016   Heart murmur 12/07/2016   HTN (hypertension)    Lexiscan Myoview 04/2021    Myoview 10/22: EF 78, no ischemia or infarction; low risk   mild Aortic valve insufficiency    Mild Mitral valve regurgitation    PAD (peripheral artery disease) (HCC)    Pain in the wrist, unspecified laterality 03/03/2014   Personal history of COVID-19 04/2021   mild symptoms x 1 week all symptoms resolved   Prolapse of anterior vaginal wall    Tachycardia    Episodic   Upper back pain    Wears glasses for reading    Wears partial denture upper    Past Surgical History:  Procedure Laterality Date   ANTERIOR AND POSTERIOR REPAIR WITH SACROSPINOUS FIXATION N/A 09/14/2021   Procedure: ANTERIOR REPAIR WITH SACROSPINOUS FIXATION;  Surgeon: [name], MD;  Location: [facility];  Service: Gynecology;  Laterality: N/A;   APPENDECTOMY  77 yrs old   BUNIONECTOMY Bilateral    more than 40 yrs ago   colonscopy  2018   CYSTOSCOPY N/A 09/14/2021   Procedure: CYSTOSCOPY;  Surgeon: [name], MD;  Location: [facility];  Service: Gynecology;  Laterality: N/A;   WISDOM TOOTH EXTRACTION  77 yrs old   Patient Active  Problem List   Diagnosis Date Noted   PAD (peripheral artery disease) (HCC) 08/18/2022   Myalgia 08/18/2022   Uterovaginal prolapse, incomplete 09/14/2021   Right calf pain 03/24/2021   Primary osteoarthritis of right knee 12/24/2019   Sciatica, right side 10/29/2019   RUQ pain 07/20/2018   Influenza A 07/18/2018   Senile ecchymosis 02/03/2018   Atypical chest pain 04/17/2017   Bronchitis, acute 01/09/2017   Heart murmur 12/07/2016   Chronic shoulder pain 12/07/2016   Epistaxis 09/30/2015   Tinnitus of right ear 09/30/2015   Left arm pain 09/30/2015   Left hip pain 09/30/2015   Generalized osteoarthritis of multiple sites 09/30/2015   Medicare annual wellness visit, subsequent 04/14/2015   Globus sensation 02/23/2015   Paresthesia 11/08/2014   Osteopenia 04/15/2014   Prediabetes 04/07/2014   Pain in the wrist, unspecified laterality 03/03/2014   Paresthesia of left arm 11/16/2013   Lateral epicondylitis of left elbow 04/19/2013   Hematochezia 04/19/2013   DDD (degenerative disc disease), cervical 05/16/2012   Preventative health care 03/31/2011   JOINT PAIN, HAND 07/23/2010   DISTURBANCE OF SKIN SENSATION 07/04/2009   Hyperlipidemia, mixed 07/04/2009   Constipation 12/02/2008   TENESMUS 12/02/2008   Tachycardia 11/07/2007   Essential hypertension 06/01/2007  ALLERGIC RHINITIS 06/01/2007    PCP: [name], [name], MD  REFERRING PROVIDER: [name], [name], MD   REFERRING DIAG: Bilateral shoulder pain, unspecified chronicity   THERAPY DIAG:  Bilateral shoulder pain, unspecified chronicity  Muscle weakness (generalized)  Rationale for Evaluation and Treatment: Rehabilitation  ONSET DATE: 7 yrs  SUBJECTIVE:                                                                                                                                                                                      SUBJECTIVE STATEMENT: Patient reports she did not do her exercises the last 3  days due to being busy.   Hand dominance: Right  PERTINENT HISTORY: As per md: She has received shoulder injections from orthopedics in the past. Her condition has worsened since her last visit in January. (PCP)  PAIN:  Are you having pain? Yes: NPRS scale: 2-3/10 Pain location: bilat shoulders Pain description: constant ache Aggravating factors: reaching over head, any shoulder movement Relieving factors: resting not moving  PRECAUTIONS: None  RED FLAGS: None   WEIGHT BEARING RESTRICTIONS: No  FALLS:  Has patient fallen in last 6 months? No  LIVING ENVIRONMENT: Lives with: lives with their family and lives with their spouse Lives in: House/apartment   OCCUPATION: Retired school teachers  PLOF: Independent  PATIENT GOALS: Be able to sleep without pain when I move.  NEXT MD VISIT: as needed  OBJECTIVE:  Note: Objective measures were completed at Evaluation unless otherwise noted.  DIAGNOSTIC FINDINGS:  Feb 2024 IMPRESSION: This MRI of the cervical spine without contrast shows the following: The spinal cord appears normal. At C3-C4, there is central disc protrusion and mild uncovertebral spurring causing mild spinal stenosis but no nerve root compression.  The degenerative changes have progressed compared to the 2013 MRI. At C4-C5, there are stable degenerative changes causing mild spinal stenosis and moderate left foraminal narrowing encroaching on the left C5 nerve root without causing definite compression. At C5-C6 there are degenerative changes but no spinal stenosis or nerve root compression. At C6-C7, degenerative changes cause moderate right foraminal narrowing encroaching upon the right C6 nerve root without causing definite nerve root compression.  The degenerative changes have progressed compared to 2013.  PATIENT SURVEYS :  FOTO Primary score 43% with goal of 59%  COGNITION: Overall cognitive status: Within functional limits for tasks  assessed     SENSATION: WFL  POSTURE: Forward head and rounded shoulders  UPPER EXTREMITY ROM:   Active ROM Right eval Left eval Rt/Lt 10/28 Rt/Lt 11/18 Right  Left   Shoulder flexion 109 105 120/114 130/100 132 118  Shoulder extension 41 24  Shoulder abduction 83 69      Shoulder adduction        Shoulder internal rotation s1 Mid glut   Mid glut  Lateral hip   Shoulder external rotation c7 c7   Occiput  c4  Elbow flexion        Elbow extension        Wrist flexion        Wrist extension        Wrist ulnar deviation        Wrist radial deviation        Wrist pronation        Wrist supination        (Blank rows = not tested)  UPPER EXTREMITY MMT:  MMT Right eval Left eval Right  Left   Shoulder flexion 3/5 in available range 3 limited to pain 4 3+  Shoulder extension      Shoulder abduction 3- limited to pain 3- limited to pain    Shoulder adduction      Shoulder internal rotation   4 3+  Shoulder external rotation   3+ 3  Middle trapezius 3 3    Lower trapezius      Elbow flexion 4P! 4P!    Elbow extension      Wrist flexion      Wrist extension      Wrist ulnar deviation      Wrist radial deviation      Wrist pronation      Wrist supination      Grip strength (lbs)      (Blank rows = not tested)  SHOULDER SPECIAL TESTS: Rotator cuff assessment: Empty can test: positive    PALPATION:  TTP   TODAY'S TREATMENT:                                                                                                                                          Treatment                            12/6:  PROM bil shoulders Supine wand press up 1# bar 2x10 Supine wand flexion 1# bar 2x10 Seated scap squeeze x20 Seated active shoulder flexion x10ea Chicken wing 2x10ea Seated press out x10ea Bicep curls 2lb bar 2x10  11/27  Manual: PROM into flexion and ER; reviewed trigger points with the patient and self trigger points   Supine: Wand flexion 2x10   Wand press 2x10   Seated:  Scap squeezes x20  Chicken wing x20  Biceps curl 2 lb bar 2x10   Reviewed tests and measures.  Reviewed Foto score.   Treatment                            11/25:  PROM bil shoulders Supine wand press  up 1# bar 2x10 Supine wand flexion 1# bar 2x10 Seated scap squeeze x20 Seated active shoulder flexion x10ea Chicken wing x10ea Seated press out x10ea Bicep curls 2lb x20 R, 1# x8L (stopped due to crepitus)  Treatment                            11/21:  PROM bil shoulders Supine wand press up 2# bar 2x10 Supine wand flexion 2# bar 2x10 Supine active flexion x15ea Seated scap squeeze x20 Seated wand flexion to 90deg  2x10 Horizontal adduction with cane  Shoulder extension with cane Bicep curls 2lb x15ea    Treatment                            11/18:  Manual: STM bil subscap paired with passive flexion, suboccipital release, cervical paraspinal STM- able to increase AROM Rt 140/Lt 110 following manual Supine shoulder flexion with wand Horiz abd/add for chest stretch Low diag pull yellow tband- minimal tolerance today.    PATIENT EDUCATION: Education details: Discussed eval findings, rehab rationale, aquatic program progression/POC and pools in area. Patient is in agreement  Person educated: Patient Education method: Explanation Education comprehension: verbalized understanding  HOME EXERCISE PROGRAM: Access Code: [email or web address] URL: [email or web address]   ASSESSMENT:  CLINICAL IMPRESSION:  Continue to work on improving available mobility within pain-free limits.  Patient continues with crepitus especially in left shoulder with active movement and passive abduction/ rotation.  Ongoing trigger points present throughout arms and shoulder girdle.  Some difficulty with bicep curls today due to left biceps muscle tightness/trigger points.  Will continue to monitor her pain level and level of function as we progress.  Eval: Patient is  a 77 y.o. f who was seen today for physical therapy evaluation and treatment for shoulder pain. She reports many years of discomfort receiving PT for in past without any improvement.  She has had steroid injections but did not return to ortho because she was not ready for any surgical procedure.  She presents today with limited bilateral ROM, pain and strength deficits throughout shoulders.  She has tenderness with palpation bilaterally about subacromial bursa and rotator cuff insertion.  Pain is constant and interrupts sleep. She will benefit from skilled physical therapy to improve ue movement and strength allowing for decreased difficulty completing ADL's and decreasing pain.   OBJECTIVE IMPAIRMENTS: decreased coordination, decreased ROM, decreased strength, impaired UE functional use, and pain.   ACTIVITY LIMITATIONS: carrying, lifting, bathing, dressing, and reach over head  PARTICIPATION LIMITATIONS: meal prep, cleaning, and laundry  PERSONAL FACTORS: Age and Time since onset of injury/illness/exacerbation are also affecting patient's functional outcome.   REHAB POTENTIAL: Good  CLINICAL DECISION MAKING: Stable/uncomplicated  EVALUATION COMPLEXITY: Low  GOALS: Goals reviewed with patient? Yes  SHORT TERM GOALS: Target date: 05/27/23  Pt will be indep and consistent with HEP to demonstrate commitment to improvement  Baseline: Goal status:achieved 11/2 8  2.  Pt will report decreased pain with ADL's/combing hair Baseline:  Goal status: achieved 11/2 8  3.  Pt will report a decrease in shoulder pain by 2 NPRS to improve comfort Baseline: Pain can fluctuate above 08/1009/8  LONG TERM GOALS: Target date: 06/24/23  Pt to meet stated Foto Goal of 59% Baseline: 43% Goal status: Has achieved Foto goal 11/30  2.  Pt will improve shoulder flex and abd ROM by 20D Baseline: see chart Goal status: Improved 10  degrees 11/28  3.  Pt will improve shoulder and scapular strength by at  least 1 grade Baseline: see chart Goal status: Still limited with external rotation and flexion 11/28  4.  Pt will report ability to reach up into cabinet with less discomfort Baseline:  Goal status: Improving mobility 11/28  5.  Pt will report reduced incidence of dropping objects/improved hand strength Baseline:  Goal status: Improving 11/28    PLAN: PT FREQUENCY: 1-2x/week  PT DURATION: 8 weeks12 visits.  Extended to allow for scheduling conflicts  PLANNED INTERVENTIONS: Therapeutic exercises, Therapeutic activity, Neuromuscular re-education, Balance training, Gait training, Patient/Family education, Self Care, Joint mobilization, DME instructions, Aquatic Therapy, Dry Needling, Cryotherapy, Moist heat, Taping, Ionotophoresis 4mg /ml Dexamethasone, Manual therapy, and Re-evaluation  PLAN FOR NEXT SESSION: Land based: ROM and strengthening bilat ue shoulder complex. Consider TB  Riki Altes, PTA  06/24/23 12:08 PM

## 2023-06-30 ENCOUNTER — Ambulatory Visit (HOSPITAL_BASED_OUTPATIENT_CLINIC_OR_DEPARTMENT_OTHER): Payer: Medicare Other

## 2023-06-30 ENCOUNTER — Encounter (HOSPITAL_BASED_OUTPATIENT_CLINIC_OR_DEPARTMENT_OTHER): Payer: Self-pay

## 2023-06-30 DIAGNOSIS — M25512 Pain in left shoulder: Secondary | ICD-10-CM | POA: Diagnosis not present

## 2023-06-30 DIAGNOSIS — M6281 Muscle weakness (generalized): Secondary | ICD-10-CM

## 2023-06-30 DIAGNOSIS — M25511 Pain in right shoulder: Secondary | ICD-10-CM | POA: Diagnosis not present

## 2023-06-30 NOTE — Therapy (Signed)
OUTPATIENT PHYSICAL THERAPY UPPER EXTREMITY TREATMENT   Patient Name: Eileen Hansen MRN: [id number] DOB:[birth year], 77 y.o., female Today's Date: 06/30/2023  END OF SESSION:  PT End of Session - 06/30/23 1509     Visit Number 14    Number of Visits 24    Date for PT Re-Evaluation 07/27/23    PT Start Time 1436    PT Stop Time 1519    PT Time Calculation (min) 43 min    Activity Tolerance Patient tolerated treatment well    Behavior During Therapy [facility] for tasks assessed/performed                         Past Medical History:  Diagnosis Date   Allergic rhinitis    Arthralgia 09/30/2015   Arthritis    fingers   Bruises easily and skin thin 09/11/2021   Chronic  bilateral shoulder pain left shoulder limited rom 12/07/2016   Heart murmur 12/07/2016   HTN (hypertension)    Lexiscan Myoview 04/2021    Myoview 10/22: EF 78, no ischemia or infarction; low risk   mild Aortic valve insufficiency    Mild Mitral valve regurgitation    PAD (peripheral artery disease) (HCC)    Pain in the wrist, unspecified laterality 03/03/2014   Personal history of COVID-19 04/2021   mild symptoms x 1 week all symptoms resolved   Prolapse of anterior vaginal wall    Tachycardia    Episodic   Upper back pain    Wears glasses for reading    Wears partial denture upper    Past Surgical History:  Procedure Laterality Date   ANTERIOR AND POSTERIOR REPAIR WITH SACROSPINOUS FIXATION N/A 09/14/2021   Procedure: ANTERIOR REPAIR WITH SACROSPINOUS FIXATION;  Surgeon: [name], MD;  Location: [facility];  Service: Gynecology;  Laterality: N/A;   APPENDECTOMY  77 yrs old   BUNIONECTOMY Bilateral    more than 40 yrs ago   colonscopy  2018   CYSTOSCOPY N/A 09/14/2021   Procedure: CYSTOSCOPY;  Surgeon: [name], MD;  Location: [facility];  Service: Gynecology;  Laterality: N/A;   WISDOM TOOTH EXTRACTION  77 yrs old   Patient Active  Problem List   Diagnosis Date Noted   PAD (peripheral artery disease) (HCC) 08/18/2022   Myalgia 08/18/2022   Uterovaginal prolapse, incomplete 09/14/2021   Right calf pain 03/24/2021   Primary osteoarthritis of right knee 12/24/2019   Sciatica, right side 10/29/2019   RUQ pain 07/20/2018   Influenza A 07/18/2018   Senile ecchymosis 02/03/2018   Atypical chest pain 04/17/2017   Bronchitis, acute 01/09/2017   Heart murmur 12/07/2016   Chronic shoulder pain 12/07/2016   Epistaxis 09/30/2015   Tinnitus of right ear 09/30/2015   Left arm pain 09/30/2015   Left hip pain 09/30/2015   Generalized osteoarthritis of multiple sites 09/30/2015   Medicare annual wellness visit, subsequent 04/14/2015   Globus sensation 02/23/2015   Paresthesia 11/08/2014   Osteopenia 04/15/2014   Prediabetes 04/07/2014   Pain in the wrist, unspecified laterality 03/03/2014   Paresthesia of left arm 11/16/2013   Lateral epicondylitis of left elbow 04/19/2013   Hematochezia 04/19/2013   DDD (degenerative disc disease), cervical 05/16/2012   Preventative health care 03/31/2011   JOINT PAIN, HAND 07/23/2010   DISTURBANCE OF SKIN SENSATION 07/04/2009   Hyperlipidemia, mixed 07/04/2009   Constipation 12/02/2008   TENESMUS 12/02/2008   Tachycardia 11/07/2007   Essential hypertension  06/01/2007   ALLERGIC RHINITIS 06/01/2007    PCP: [name], [name], MD  REFERRING PROVIDER: [name], [name], MD   REFERRING DIAG: Bilateral shoulder pain, unspecified chronicity   THERAPY DIAG:  Bilateral shoulder pain, unspecified chronicity  Muscle weakness (generalized)  Rationale for Evaluation and Treatment: Rehabilitation  ONSET DATE: 7 yrs  SUBJECTIVE:                                                                                                                                                                                      SUBJECTIVE STATEMENT: Patient reports she did not do her exercises the last 3  days due to being busy.   Hand dominance: Right  PERTINENT HISTORY: As per md: She has received shoulder injections from orthopedics in the past. Her condition has worsened since her last visit in January. (PCP)  PAIN:  Are you having pain? Yes: NPRS scale: 2-3/10 Pain location: bilat shoulders Pain description: constant ache Aggravating factors: reaching over head, any shoulder movement Relieving factors: resting not moving  PRECAUTIONS: None  RED FLAGS: None   WEIGHT BEARING RESTRICTIONS: No  FALLS:  Has patient fallen in last 6 months? No  LIVING ENVIRONMENT: Lives with: lives with their family and lives with their spouse Lives in: House/apartment   OCCUPATION: Retired school teachers  PLOF: Independent  PATIENT GOALS: Be able to sleep without pain when I move.  NEXT MD VISIT: as needed  OBJECTIVE:  Note: Objective measures were completed at Evaluation unless otherwise noted.  DIAGNOSTIC FINDINGS:  Feb 2024 IMPRESSION: This MRI of the cervical spine without contrast shows the following: The spinal cord appears normal. At C3-C4, there is central disc protrusion and mild uncovertebral spurring causing mild spinal stenosis but no nerve root compression.  The degenerative changes have progressed compared to the 2013 MRI. At C4-C5, there are stable degenerative changes causing mild spinal stenosis and moderate left foraminal narrowing encroaching on the left C5 nerve root without causing definite compression. At C5-C6 there are degenerative changes but no spinal stenosis or nerve root compression. At C6-C7, degenerative changes cause moderate right foraminal narrowing encroaching upon the right C6 nerve root without causing definite nerve root compression.  The degenerative changes have progressed compared to 2013.  PATIENT SURVEYS :  FOTO Primary score 43% with goal of 59%  COGNITION: Overall cognitive status: Within functional limits for tasks  assessed     SENSATION: WFL  POSTURE: Forward head and rounded shoulders  UPPER EXTREMITY ROM:   Active ROM Right eval Left eval Rt/Lt 10/28 Rt/Lt 11/18 Right  Left   Shoulder flexion 109 105 120/114 130/100 132 118  Shoulder extension 41  24      Shoulder abduction 83 69      Shoulder adduction        Shoulder internal rotation s1 Mid glut   Mid glut  Lateral hip   Shoulder external rotation c7 c7   Occiput  c4  Elbow flexion        Elbow extension        Wrist flexion        Wrist extension        Wrist ulnar deviation        Wrist radial deviation        Wrist pronation        Wrist supination        (Blank rows = not tested)  UPPER EXTREMITY MMT:  MMT Right eval Left eval Right  Left   Shoulder flexion 3/5 in available range 3 limited to pain 4 3+  Shoulder extension      Shoulder abduction 3- limited to pain 3- limited to pain    Shoulder adduction      Shoulder internal rotation   4 3+  Shoulder external rotation   3+ 3  Middle trapezius 3 3    Lower trapezius      Elbow flexion 4P! 4P!    Elbow extension      Wrist flexion      Wrist extension      Wrist ulnar deviation      Wrist radial deviation      Wrist pronation      Wrist supination      Grip strength (lbs)      (Blank rows = not tested)  SHOULDER SPECIAL TESTS: Rotator cuff assessment: Empty can test: positive    PALPATION:  TTP   TODAY'S TREATMENT:                                                                                                                                           Treatment                            12/12:  PROM bil shoulders Supine wand press up 2# bar 2x10 Supine wand flexion 2# bar 2x10 partial range Seated scap squeeze x20 Seated press out x10ea Bicep curls 2lb bar 2x10 Resisted row RTB 2x10  Treatment                            12/6:  PROM bil shoulders Supine wand press up 1# bar 2x10 Supine wand flexion 1# bar 2x10 Seated scap squeeze  x20 Seated active shoulder flexion x10ea Chicken wing 2x10ea Seated press out x10ea Bicep curls 2lb bar 2x10  11/27  Manual: PROM into flexion and ER; reviewed trigger points with the patient and self trigger points   Supine:  Wand flexion 2x10  Wand press 2x10   Seated:  Scap squeezes x20  Chicken wing x20  Biceps curl 2 lb bar 2x10   Reviewed tests and measures.  Reviewed Foto score.   Treatment                            11/25:  PROM bil shoulders Supine wand press up 1# bar 2x10 Supine wand flexion 1# bar 2x10 Seated scap squeeze x20 Seated active shoulder flexion x10ea Chicken wing x10ea Seated press out x10ea Bicep curls 2lb x20 R, 1# x8L (stopped due to crepitus)    PATIENT EDUCATION: Education details: Discussed eval findings, rehab rationale, aquatic program progression/POC and pools in area. Patient is in agreement  Person educated: Patient Education method: Explanation Education comprehension: verbalized understanding  HOME EXERCISE PROGRAM: Access Code: [email or web address] URL: [email or web address]   ASSESSMENT:  CLINICAL IMPRESSION:  Pt continues to be limited in end range due to level of arthritis present in bilateral shoulders. Continues to report overall improvement in ROM, though remains painful with daily activities. She has good tolerance with PT interventions. Able to add resistance to scapular row today without compliant. Will continue to progress as tolerated with functional ROM and strength.   Eval: Patient is a 77 y.o. f who was seen today for physical therapy evaluation and treatment for shoulder pain. She reports many years of discomfort receiving PT for in past without any improvement.  She has had steroid injections but did not return to ortho because she was not ready for any surgical procedure.  She presents today with limited bilateral ROM, pain and strength deficits throughout shoulders.  She has tenderness with palpation  bilaterally about subacromial bursa and rotator cuff insertion.  Pain is constant and interrupts sleep. She will benefit from skilled physical therapy to improve ue movement and strength allowing for decreased difficulty completing ADL's and decreasing pain.   OBJECTIVE IMPAIRMENTS: decreased coordination, decreased ROM, decreased strength, impaired UE functional use, and pain.   ACTIVITY LIMITATIONS: carrying, lifting, bathing, dressing, and reach over head  PARTICIPATION LIMITATIONS: meal prep, cleaning, and laundry  PERSONAL FACTORS: Age and Time since onset of injury/illness/exacerbation are also affecting patient's functional outcome.   REHAB POTENTIAL: Good  CLINICAL DECISION MAKING: Stable/uncomplicated  EVALUATION COMPLEXITY: Low  GOALS: Goals reviewed with patient? Yes  SHORT TERM GOALS: Target date: 05/27/23  Pt will be indep and consistent with HEP to demonstrate commitment to improvement  Baseline: Goal status:achieved 11/2 8  2.  Pt will report decreased pain with ADL's/combing hair Baseline:  Goal status: achieved 11/2 8  3.  Pt will report a decrease in shoulder pain by 2 NPRS to improve comfort Baseline: Pain can fluctuate above 08/1009/8  LONG TERM GOALS: Target date: 06/24/23  Pt to meet stated Foto Goal of 59% Baseline: 43% Goal status: Has achieved Foto goal 11/30  2.  Pt will improve shoulder flex and abd ROM by 20D Baseline: see chart Goal status: Improved 10 degrees 11/28  3.  Pt will improve shoulder and scapular strength by at least 1 grade Baseline: see chart Goal status: Still limited with external rotation and flexion 11/28  4.  Pt will report ability to reach up into cabinet with less discomfort Baseline:  Goal status: Improving mobility 11/28  5.  Pt will report reduced incidence of dropping objects/improved hand strength Baseline:  Goal status: Improving 11/28    PLAN: PT FREQUENCY: 1-2x/week  PT DURATION:  8 weeks12 visits.   Extended to allow for scheduling conflicts  PLANNED INTERVENTIONS: Therapeutic exercises, Therapeutic activity, Neuromuscular re-education, Balance training, Gait training, Patient/Family education, Self Care, Joint mobilization, DME instructions, Aquatic Therapy, Dry Needling, Cryotherapy, Moist heat, Taping, Ionotophoresis 4mg /ml Dexamethasone, Manual therapy, and Re-evaluation  PLAN FOR NEXT SESSION: Land based: ROM and strengthening bilat ue shoulder complex. Consider TB  Riki Altes, PTA  06/30/23 3:37 PM

## 2023-07-05 ENCOUNTER — Ambulatory Visit (HOSPITAL_BASED_OUTPATIENT_CLINIC_OR_DEPARTMENT_OTHER): Payer: Medicare Other | Admitting: Physical Therapy

## 2023-07-05 DIAGNOSIS — M25511 Pain in right shoulder: Secondary | ICD-10-CM

## 2023-07-05 DIAGNOSIS — M25512 Pain in left shoulder: Secondary | ICD-10-CM | POA: Diagnosis not present

## 2023-07-05 DIAGNOSIS — M6281 Muscle weakness (generalized): Secondary | ICD-10-CM

## 2023-07-05 NOTE — Therapy (Signed)
OUTPATIENT PHYSICAL THERAPY UPPER EXTREMITY TREATMENT   Patient Name: Eileen Hansen MRN: [id number] DOB:1945-11-13, 77 y.o., female Today's Date: 07/06/2023  END OF SESSION:  PT End of Session - 07/05/23 1534     Visit Number 15    Number of Visits 24    Date for PT Re-Evaluation 07/27/23    PT Start Time 1515    PT Stop Time 1556    PT Time Calculation (min) 41 min    Activity Tolerance Patient tolerated treatment well    Behavior During Therapy [facility] for tasks assessed/performed                          Past Medical History:  Diagnosis Date   Allergic rhinitis    Arthralgia 09/30/2015   Arthritis    fingers   Bruises easily and skin thin 09/11/2021   Chronic  bilateral shoulder pain left shoulder limited rom 12/07/2016   Heart murmur 12/07/2016   HTN (hypertension)    Lexiscan Myoview 04/2021    Myoview 10/22: EF 78, no ischemia or infarction; low risk   mild Aortic valve insufficiency    Mild Mitral valve regurgitation    PAD (peripheral artery disease) (HCC)    Pain in the wrist, unspecified laterality 03/03/2014   Personal history of COVID-19 04/2021   mild symptoms x 1 week all symptoms resolved   Prolapse of anterior vaginal wall    Tachycardia    Episodic   Upper back pain    Wears glasses for reading    Wears partial denture upper    Past Surgical History:  Procedure Laterality Date   ANTERIOR AND POSTERIOR REPAIR WITH SACROSPINOUS FIXATION N/A 09/14/2021   Procedure: ANTERIOR REPAIR WITH SACROSPINOUS FIXATION;  Surgeon: [name], MD;  Location: [facility];  Service: Gynecology;  Laterality: N/A;   APPENDECTOMY  77 yrs old   BUNIONECTOMY Bilateral    more than 40 yrs ago   colonscopy  2018   CYSTOSCOPY N/A 09/14/2021   Procedure: CYSTOSCOPY;  Surgeon: [name], MD;  Location: [facility];  Service: Gynecology;  Laterality: N/A;   WISDOM TOOTH EXTRACTION  77 yrs old   Patient Active  Problem List   Diagnosis Date Noted   PAD (peripheral artery disease) (HCC) 08/18/2022   Myalgia 08/18/2022   Uterovaginal prolapse, incomplete 09/14/2021   Right calf pain 03/24/2021   Primary osteoarthritis of right knee 12/24/2019   Sciatica, right side 10/29/2019   RUQ pain 07/20/2018   Influenza A 07/18/2018   Senile ecchymosis 02/03/2018   Atypical chest pain 04/17/2017   Bronchitis, acute 01/09/2017   Heart murmur 12/07/2016   Chronic shoulder pain 12/07/2016   Epistaxis 09/30/2015   Tinnitus of right ear 09/30/2015   Left arm pain 09/30/2015   Left hip pain 09/30/2015   Generalized osteoarthritis of multiple sites 09/30/2015   Medicare annual wellness visit, subsequent 04/14/2015   Globus sensation 02/23/2015   Paresthesia 11/08/2014   Osteopenia 04/15/2014   Prediabetes 04/07/2014   Pain in the wrist, unspecified laterality 03/03/2014   Paresthesia of left arm 11/16/2013   Lateral epicondylitis of left elbow 04/19/2013   Hematochezia 04/19/2013   DDD (degenerative disc disease), cervical 05/16/2012   Preventative health care 03/31/2011   JOINT PAIN, HAND 07/23/2010   DISTURBANCE OF SKIN SENSATION 07/04/2009   Hyperlipidemia, mixed 07/04/2009   Constipation 12/02/2008   TENESMUS 12/02/2008   Tachycardia 11/07/2007   Essential  hypertension 06/01/2007   ALLERGIC RHINITIS 06/01/2007    PCP: [name], [name], MD  REFERRING PROVIDER: [name], [name], MD   REFERRING DIAG: Bilateral shoulder pain, unspecified chronicity   THERAPY DIAG:  Bilateral shoulder pain, unspecified chronicity  Muscle weakness (generalized)  Rationale for Evaluation and Treatment: Rehabilitation  ONSET DATE: 7 yrs  SUBJECTIVE:                                                                                                                                                                                      SUBJECTIVE STATEMENT: The patient reports the ROM has improved. She feels like  she can use the shoulder more    Hand dominance: Right  PERTINENT HISTORY: As per md: She has received shoulder injections from orthopedics in the past. Her condition has worsened since her last visit in January. (PCP)  PAIN:  Are you having pain? Yes: NPRS scale: 2-3/10 Pain location: bilat shoulders Pain description: constant ache Aggravating factors: reaching over head, any shoulder movement Relieving factors: resting not moving  PRECAUTIONS: None  RED FLAGS: None   WEIGHT BEARING RESTRICTIONS: No  FALLS:  Has patient fallen in last 6 months? No  LIVING ENVIRONMENT: Lives with: lives with their family and lives with their spouse Lives in: House/apartment   OCCUPATION: Retired school teachers  PLOF: Independent  PATIENT GOALS: Be able to sleep without pain when I move.  NEXT MD VISIT: as needed  OBJECTIVE:  Note: Objective measures were completed at Evaluation unless otherwise noted.  DIAGNOSTIC FINDINGS:  Feb 2024 IMPRESSION: This MRI of the cervical spine without contrast shows the following: The spinal cord appears normal. At C3-C4, there is central disc protrusion and mild uncovertebral spurring causing mild spinal stenosis but no nerve root compression.  The degenerative changes have progressed compared to the 2013 MRI. At C4-C5, there are stable degenerative changes causing mild spinal stenosis and moderate left foraminal narrowing encroaching on the left C5 nerve root without causing definite compression. At C5-C6 there are degenerative changes but no spinal stenosis or nerve root compression. At C6-C7, degenerative changes cause moderate right foraminal narrowing encroaching upon the right C6 nerve root without causing definite nerve root compression.  The degenerative changes have progressed compared to 2013.  PATIENT SURVEYS :  FOTO Primary score 43% with goal of 59%  COGNITION: Overall cognitive status: Within functional limits for tasks  assessed     SENSATION: WFL  POSTURE: Forward head and rounded shoulders  UPPER EXTREMITY ROM:   Active ROM Right eval Left eval Rt/Lt 10/28 Rt/Lt 11/18 Right  Left   Shoulder flexion 109 105 120/114 130/100 132 118  Shoulder  extension 41 24      Shoulder abduction 83 69      Shoulder adduction        Shoulder internal rotation s1 Mid glut   Mid glut  Lateral hip   Shoulder external rotation c7 c7   Occiput  c4  Elbow flexion        Elbow extension        Wrist flexion        Wrist extension        Wrist ulnar deviation        Wrist radial deviation        Wrist pronation        Wrist supination        (Blank rows = not tested)  UPPER EXTREMITY MMT:  MMT Right eval Left eval Right  Left   Shoulder flexion 3/5 in available range 3 limited to pain 4 3+  Shoulder extension      Shoulder abduction 3- limited to pain 3- limited to pain    Shoulder adduction      Shoulder internal rotation   4 3+  Shoulder external rotation   3+ 3  Middle trapezius 3 3    Lower trapezius      Elbow flexion 4P! 4P!    Elbow extension      Wrist flexion      Wrist extension      Wrist ulnar deviation      Wrist radial deviation      Wrist pronation      Wrist supination      Grip strength (lbs)      (Blank rows = not tested)  SHOULDER SPECIAL TESTS: Rotator cuff assessment: Empty can test: positive    PALPATION:  TTP   TODAY'S TREATMENT:                                                                                                                                          12/17  PROM bil shoulders Supine wand press up 2# bar 2x10 Supine wand flexion 2# bar 2x10 partial range Seated scap squeeze x20 Seated press out 2x10ea  Bicep curls 2lb bar 2x10 Resisted row RTB 2x10  Treatment                            12/12:  PROM bil shoulders Supine wand press up 2# bar 2x10 Supine wand flexion 2# bar 2x10 partial range Seated scap squeeze x20 Seated press out  x10ea Bicep curls 2lb bar 2x10 Resisted row RTB 2x10  Treatment                            12/6:  PROM bil shoulders Supine wand press up 1# bar 2x10 Supine wand flexion 1# bar 2x10 Seated scap squeeze  x20 Seated active shoulder flexion x10ea Chicken wing 2x10ea Seated press out x10ea Bicep curls 2lb bar 2x10  11/27  Manual: PROM into flexion and ER; reviewed trigger points with the patient and self trigger points   Supine: Wand flexion 2x10  Wand press 2x10   Seated:  Scap squeezes x20  Chicken wing x20  Biceps curl 2 lb bar 2x10   Reviewed tests and measures.  Reviewed Foto score.   Treatment                            11/25:  PROM bil shoulders Supine wand press up 1# bar 2x10 Supine wand flexion 1# bar 2x10 Seated scap squeeze x20 Seated active shoulder flexion x10ea Chicken wing x10ea Seated press out x10ea Bicep curls 2lb x20 R, 1# x8L (stopped due to crepitus)    PATIENT EDUCATION: Education details: Discussed eval findings, rehab rationale, aquatic program progression/POC and pools in area. Patient is in agreement  Person educated: Patient Education method: Explanation Education comprehension: verbalized understanding  HOME EXERCISE PROGRAM: Access Code: [email or web address] URL: [email or web address]   ASSESSMENT:  CLINICAL IMPRESSION: The patient tolerated treatment well. She had mild pain and moderate crepitus with all  of her exercises. We will continue to progress as tolerated.   Eval: Patient is a 77 y.o. f who was seen today for physical therapy evaluation and treatment for shoulder pain. She reports many years of discomfort receiving PT for in past without any improvement.  She has had steroid injections but did not return to ortho because she was not ready for any surgical procedure.  She presents today with limited bilateral ROM, pain and strength deficits throughout shoulders.  She has tenderness with palpation bilaterally about  subacromial bursa and rotator cuff insertion.  Pain is constant and interrupts sleep. She will benefit from skilled physical therapy to improve ue movement and strength allowing for decreased difficulty completing ADL's and decreasing pain.   OBJECTIVE IMPAIRMENTS: decreased coordination, decreased ROM, decreased strength, impaired UE functional use, and pain.   ACTIVITY LIMITATIONS: carrying, lifting, bathing, dressing, and reach over head  PARTICIPATION LIMITATIONS: meal prep, cleaning, and laundry  PERSONAL FACTORS: Age and Time since onset of injury/illness/exacerbation are also affecting patient's functional outcome.   REHAB POTENTIAL: Good  CLINICAL DECISION MAKING: Stable/uncomplicated  EVALUATION COMPLEXITY: Low  GOALS: Goals reviewed with patient? Yes  SHORT TERM GOALS: Target date: 05/27/23  Pt will be indep and consistent with HEP to demonstrate commitment to improvement  Baseline: Goal status:achieved 11/2 8  2.  Pt will report decreased pain with ADL's/combing hair Baseline:  Goal status: achieved 11/2 8  3.  Pt will report a decrease in shoulder pain by 2 NPRS to improve comfort Baseline: Pain can fluctuate above 08/1009/8  LONG TERM GOALS: Target date: 06/24/23  Pt to meet stated Foto Goal of 59% Baseline: 43% Goal status: Has achieved Foto goal 11/30  2.  Pt will improve shoulder flex and abd ROM by 20D Baseline: see chart Goal status: Improved 10 degrees 11/28  3.  Pt will improve shoulder and scapular strength by at least 1 grade Baseline: see chart Goal status: Still limited with external rotation and flexion 11/28  4.  Pt will report ability to reach up into cabinet with less discomfort Baseline:  Goal status: Improving mobility 11/28  5.  Pt will report reduced incidence of dropping objects/improved hand strength Baseline:  Goal status: Improving 11/28    PLAN:  PT FREQUENCY: 1-2x/week  PT DURATION: 8 weeks12 visits.  Extended to allow for  scheduling conflicts  PLANNED INTERVENTIONS: Therapeutic exercises, Therapeutic activity, Neuromuscular re-education, Balance training, Gait training, Patient/Family education, Self Care, Joint mobilization, DME instructions, Aquatic Therapy, Dry Needling, Cryotherapy, Moist heat, Taping, Ionotophoresis 4mg /ml Dexamethasone, Manual therapy, and Re-evaluation  PLAN FOR NEXT SESSION: Land based: ROM and strengthening bilat ue shoulder complex. Consider TB  Lorayne Bender PT DPT 07/06/23 10:39 AM

## 2023-07-06 ENCOUNTER — Encounter (HOSPITAL_BASED_OUTPATIENT_CLINIC_OR_DEPARTMENT_OTHER): Payer: Self-pay | Admitting: Physical Therapy

## 2023-07-08 ENCOUNTER — Encounter (HOSPITAL_BASED_OUTPATIENT_CLINIC_OR_DEPARTMENT_OTHER): Payer: Medicare Other

## 2023-07-12 ENCOUNTER — Ambulatory Visit (HOSPITAL_BASED_OUTPATIENT_CLINIC_OR_DEPARTMENT_OTHER): Payer: Medicare Other | Admitting: Physical Therapy

## 2023-07-12 ENCOUNTER — Encounter (HOSPITAL_BASED_OUTPATIENT_CLINIC_OR_DEPARTMENT_OTHER): Payer: Self-pay | Admitting: Physical Therapy

## 2023-07-12 DIAGNOSIS — M25511 Pain in right shoulder: Secondary | ICD-10-CM | POA: Diagnosis not present

## 2023-07-12 DIAGNOSIS — M6281 Muscle weakness (generalized): Secondary | ICD-10-CM

## 2023-07-12 DIAGNOSIS — M25512 Pain in left shoulder: Secondary | ICD-10-CM | POA: Diagnosis not present

## 2023-07-12 NOTE — Therapy (Signed)
OUTPATIENT PHYSICAL THERAPY UPPER EXTREMITY TREATMENT   Patient Name: Eileen Hansen MRN: [id number] DOB:25-Oct-1945, 77 y.o., female Today's Date: 07/12/2023  END OF SESSION:  PT End of Session - 07/12/23 1402     Visit Number 16    Number of Visits 24    Date for PT Re-Evaluation 07/27/23    PT Start Time 1403    PT Stop Time 1441    PT Time Calculation (min) 38 min    Activity Tolerance Patient tolerated treatment well    Behavior During Therapy [facility] for tasks assessed/performed                           Past Medical History:  Diagnosis Date   Allergic rhinitis    Arthralgia 09/30/2015   Arthritis    fingers   Bruises easily and skin thin 09/11/2021   Chronic  bilateral shoulder pain left shoulder limited rom 12/07/2016   Heart murmur 12/07/2016   HTN (hypertension)    Lexiscan Myoview 04/2021    Myoview 10/22: EF 78, no ischemia or infarction; low risk   mild Aortic valve insufficiency    Mild Mitral valve regurgitation    PAD (peripheral artery disease) (HCC)    Pain in the wrist, unspecified laterality 03/03/2014   Personal history of COVID-19 04/2021   mild symptoms x 1 week all symptoms resolved   Prolapse of anterior vaginal wall    Tachycardia    Episodic   Upper back pain    Wears glasses for reading    Wears partial denture upper    Past Surgical History:  Procedure Laterality Date   ANTERIOR AND POSTERIOR REPAIR WITH SACROSPINOUS FIXATION N/A 09/14/2021   Procedure: ANTERIOR REPAIR WITH SACROSPINOUS FIXATION;  Surgeon: [name], MD;  Location: [facility];  Service: Gynecology;  Laterality: N/A;   APPENDECTOMY  77 yrs old   BUNIONECTOMY Bilateral    more than 40 yrs ago   colonscopy  2018   CYSTOSCOPY N/A 09/14/2021   Procedure: CYSTOSCOPY;  Surgeon: [name], MD;  Location: [facility];  Service: Gynecology;  Laterality: N/A;   WISDOM TOOTH EXTRACTION  77 yrs old   Patient  Active Problem List   Diagnosis Date Noted   PAD (peripheral artery disease) (HCC) 08/18/2022   Myalgia 08/18/2022   Uterovaginal prolapse, incomplete 09/14/2021   Right calf pain 03/24/2021   Primary osteoarthritis of right knee 12/24/2019   Sciatica, right side 10/29/2019   RUQ pain 07/20/2018   Influenza A 07/18/2018   Senile ecchymosis 02/03/2018   Atypical chest pain 04/17/2017   Bronchitis, acute 01/09/2017   Heart murmur 12/07/2016   Chronic shoulder pain 12/07/2016   Epistaxis 09/30/2015   Tinnitus of right ear 09/30/2015   Left arm pain 09/30/2015   Left hip pain 09/30/2015   Generalized osteoarthritis of multiple sites 09/30/2015   Medicare annual wellness visit, subsequent 04/14/2015   Globus sensation 02/23/2015   Paresthesia 11/08/2014   Osteopenia 04/15/2014   Prediabetes 04/07/2014   Pain in the wrist, unspecified laterality 03/03/2014   Paresthesia of left arm 11/16/2013   Lateral epicondylitis of left elbow 04/19/2013   Hematochezia 04/19/2013   DDD (degenerative disc disease), cervical 05/16/2012   Preventative health care 03/31/2011   JOINT PAIN, HAND 07/23/2010   DISTURBANCE OF SKIN SENSATION 07/04/2009   Hyperlipidemia, mixed 07/04/2009   Constipation 12/02/2008   TENESMUS 12/02/2008   Tachycardia 11/07/2007  Essential hypertension 06/01/2007   ALLERGIC RHINITIS 06/01/2007    PCP: [name], [name], MD  REFERRING PROVIDER: [name], [name], MD   REFERRING DIAG: Bilateral shoulder pain, unspecified chronicity   THERAPY DIAG:  Bilateral shoulder pain, unspecified chronicity  Muscle weakness (generalized)  Rationale for Evaluation and Treatment: Rehabilitation  ONSET DATE: 7 yrs  SUBJECTIVE:                                                                                                                                                                                      SUBJECTIVE STATEMENT:  Doing well, making progress but it is slow and  steady. Still hard for me to get my jacket on this can be painful, but I am able to get my dishes up to the second cabinet which I wasn't able to do this before.    Hand dominance: Right  PERTINENT HISTORY: As per md: She has received shoulder injections from orthopedics in the past. Her condition has worsened since her last visit in January. (PCP)  PAIN:  Are you having pain? No 0/10 now, in some positions pain can still get high up to 5/10  PRECAUTIONS: None  RED FLAGS: None   WEIGHT BEARING RESTRICTIONS: No  FALLS:  Has patient fallen in last 6 months? No  LIVING ENVIRONMENT: Lives with: lives with their family and lives with their spouse Lives in: House/apartment   OCCUPATION: Retired school teachers  PLOF: Independent  PATIENT GOALS: Be able to sleep without pain when I move.  NEXT MD VISIT: as needed  OBJECTIVE:  Note: Objective measures were completed at Evaluation unless otherwise noted.  DIAGNOSTIC FINDINGS:  Feb 2024 IMPRESSION: This MRI of the cervical spine without contrast shows the following: The spinal cord appears normal. At C3-C4, there is central disc protrusion and mild uncovertebral spurring causing mild spinal stenosis but no nerve root compression.  The degenerative changes have progressed compared to the 2013 MRI. At C4-C5, there are stable degenerative changes causing mild spinal stenosis and moderate left foraminal narrowing encroaching on the left C5 nerve root without causing definite compression. At C5-C6 there are degenerative changes but no spinal stenosis or nerve root compression. At C6-C7, degenerative changes cause moderate right foraminal narrowing encroaching upon the right C6 nerve root without causing definite nerve root compression.  The degenerative changes have progressed compared to 2013.  PATIENT SURVEYS :  FOTO Primary score 43% with goal of 59%  COGNITION: Overall cognitive status: Within functional limits for tasks  assessed     SENSATION: WFL  POSTURE: Forward head and rounded shoulders  UPPER EXTREMITY ROM:   Active ROM Right eval Left eval  Rt/Lt 10/28 Rt/Lt 11/18 Right  Left   Shoulder flexion 109 105 120/114 130/100 132 118  Shoulder extension 41 24      Shoulder abduction 83 69      Shoulder adduction        Shoulder internal rotation s1 Mid glut   Mid glut  Lateral hip   Shoulder external rotation c7 c7   Occiput  c4  Elbow flexion        Elbow extension        Wrist flexion        Wrist extension        Wrist ulnar deviation        Wrist radial deviation        Wrist pronation        Wrist supination        (Blank rows = not tested)  UPPER EXTREMITY MMT:  MMT Right eval Left eval Right  Left   Shoulder flexion 3/5 in available range 3 limited to pain 4 3+  Shoulder extension      Shoulder abduction 3- limited to pain 3- limited to pain    Shoulder adduction      Shoulder internal rotation   4 3+  Shoulder external rotation   3+ 3  Middle trapezius 3 3    Lower trapezius      Elbow flexion 4P! 4P!    Elbow extension      Wrist flexion      Wrist extension      Wrist ulnar deviation      Wrist radial deviation      Wrist pronation      Wrist supination      Grip strength (lbs)      (Blank rows = not tested)  SHOULDER SPECIAL TESTS: Rotator cuff assessment: Empty can test: positive    PALPATION:  TTP   TODAY'S TREATMENT:     07/12/23  TherEx  UBE L1 x3 min forward/3 min backward with MinA to get started at first but then pain free- just hard  PROM as tolerated B shoulders supine  Supine wand flexion 2# 2x12 partial range  Supine chest press 2# 2x12 Backwards shoulder circles x20 cues for ROM Scapular retractions x15                                                                                                                                          07/05/23  PROM bil shoulders Supine wand press up 2# bar 2x10 Supine wand flexion 2# bar 2x10  partial range Seated scap squeeze x20 Seated press out 2x10ea  Bicep curls 2lb bar 2x10 Resisted row RTB 2x10  Treatment                            12/12:  PROM bil shoulders Supine wand press up  2# bar 2x10 Supine wand flexion 2# bar 2x10 partial range Seated scap squeeze x20 Seated press out x10ea Bicep curls 2lb bar 2x10 Resisted row RTB 2x10  Treatment                            12/6:  PROM bil shoulders Supine wand press up 1# bar 2x10 Supine wand flexion 1# bar 2x10 Seated scap squeeze x20 Seated active shoulder flexion x10ea Chicken wing 2x10ea Seated press out x10ea Bicep curls 2lb bar 2x10  11/27  Manual: PROM into flexion and ER; reviewed trigger points with the patient and self trigger points   Supine: Wand flexion 2x10  Wand press 2x10   Seated:  Scap squeezes x20  Chicken wing x20  Biceps curl 2 lb bar 2x10   Reviewed tests and measures.  Reviewed Foto score.   Treatment                            11/25:  PROM bil shoulders Supine wand press up 1# bar 2x10 Supine wand flexion 1# bar 2x10 Seated scap squeeze x20 Seated active shoulder flexion x10ea Chicken wing x10ea Seated press out x10ea Bicep curls 2lb x20 R, 1# x8L (stopped due to crepitus)    PATIENT EDUCATION: Education details: Discussed eval findings, rehab rationale, aquatic program progression/POC and pools in area. Patient is in agreement  Person educated: Patient Education method: Explanation Education comprehension: verbalized understanding  HOME EXERCISE PROGRAM: Access Code: [email or web address] URL: [email or web address]   ASSESSMENT:  CLINICAL IMPRESSION:  Pt arrives today dong OK, she is happy to be making slow but steady progress functionally, still sounds a bit limited at home with certain tasks. Continued working on general shoulder ROM and strength with progressions as appropriate and tolerated today. Remains motivated to progress.     Eval: Patient is a  77 y.o. f who was seen today for physical therapy evaluation and treatment for shoulder pain. She reports many years of discomfort receiving PT for in past without any improvement.  She has had steroid injections but did not return to ortho because she was not ready for any surgical procedure.  She presents today with limited bilateral ROM, pain and strength deficits throughout shoulders.  She has tenderness with palpation bilaterally about subacromial bursa and rotator cuff insertion.  Pain is constant and interrupts sleep. She will benefit from skilled physical therapy to improve ue movement and strength allowing for decreased difficulty completing ADL's and decreasing pain.   OBJECTIVE IMPAIRMENTS: decreased coordination, decreased ROM, decreased strength, impaired UE functional use, and pain.   ACTIVITY LIMITATIONS: carrying, lifting, bathing, dressing, and reach over head  PARTICIPATION LIMITATIONS: meal prep, cleaning, and laundry  PERSONAL FACTORS: Age and Time since onset of injury/illness/exacerbation are also affecting patient's functional outcome.   REHAB POTENTIAL: Good  CLINICAL DECISION MAKING: Stable/uncomplicated  EVALUATION COMPLEXITY: Low  GOALS: Goals reviewed with patient? Yes  SHORT TERM GOALS: Target date: 05/27/23  Pt will be indep and consistent with HEP to demonstrate commitment to improvement  Baseline: Goal status:achieved 11/2 8  2.  Pt will report decreased pain with ADL's/combing hair Baseline:  Goal status: achieved 11/2 8  3.  Pt will report a decrease in shoulder pain by 2 NPRS to improve comfort Baseline: Pain can fluctuate above 08/1009/8  LONG TERM GOALS: Target date: 06/24/23  Pt to meet stated Foto Goal of 59% Baseline: 43% Goal  status: Has achieved Foto goal 11/30  2.  Pt will improve shoulder flex and abd ROM by 20D Baseline: see chart Goal status: Improved 10 degrees 11/28  3.  Pt will improve shoulder and scapular strength by at least  1 grade Baseline: see chart Goal status: Still limited with external rotation and flexion 11/28  4.  Pt will report ability to reach up into cabinet with less discomfort Baseline:  Goal status: Improving mobility 11/28  5.  Pt will report reduced incidence of dropping objects/improved hand strength Baseline:  Goal status: Improving 11/28    PLAN: PT FREQUENCY: 1-2x/week  PT DURATION: 8 weeks12 visits.  Extended to allow for scheduling conflicts  PLANNED INTERVENTIONS: Therapeutic exercises, Therapeutic activity, Neuromuscular re-education, Balance training, Gait training, Patient/Family education, Self Care, Joint mobilization, DME instructions, Aquatic Therapy, Dry Needling, Cryotherapy, Moist heat, Taping, Ionotophoresis 4mg /ml Dexamethasone, Manual therapy, and Re-evaluation  PLAN FOR NEXT SESSION: Land based: ROM and strengthening bilat ue shoulder complex. Consider TB with progressions as appropriate    Nedra Hai, PT, DPT 07/12/23 2:42 PM

## 2023-07-19 ENCOUNTER — Encounter (HOSPITAL_BASED_OUTPATIENT_CLINIC_OR_DEPARTMENT_OTHER): Payer: Self-pay

## 2023-07-19 ENCOUNTER — Ambulatory Visit (HOSPITAL_BASED_OUTPATIENT_CLINIC_OR_DEPARTMENT_OTHER): Payer: Medicare Other

## 2023-07-19 DIAGNOSIS — M25512 Pain in left shoulder: Secondary | ICD-10-CM | POA: Diagnosis not present

## 2023-07-19 DIAGNOSIS — M6281 Muscle weakness (generalized): Secondary | ICD-10-CM

## 2023-07-19 DIAGNOSIS — M25511 Pain in right shoulder: Secondary | ICD-10-CM | POA: Diagnosis not present

## 2023-07-19 NOTE — Therapy (Signed)
 OUTPATIENT PHYSICAL THERAPY UPPER EXTREMITY TREATMENT   Patient Name: Eileen Hansen MRN: [id number] DOB:[birth year], 77 y.o., female Today's Date: 07/19/2023  END OF SESSION:  PT End of Session - 07/19/23 1258     Visit Number 17    Number of Visits 24    Date for PT Re-Evaluation 07/27/23    PT Start Time 1303    PT Stop Time 1345    PT Time Calculation (min) 42 min    Activity Tolerance Patient tolerated treatment well    Behavior During Therapy [facility] for tasks assessed/performed                            Past Medical History:  Diagnosis Date   Allergic rhinitis    Arthralgia 09/30/2015   Arthritis    fingers   Bruises easily and skin thin 09/11/2021   Chronic  bilateral shoulder pain left shoulder limited rom 12/07/2016   Heart murmur 12/07/2016   HTN (hypertension)    Lexiscan  Myoview  04/2021    Myoview  10/22: EF 78, no ischemia or infarction; low risk   mild Aortic valve insufficiency    Mild Mitral valve regurgitation    PAD (peripheral artery disease) (HCC)    Pain in the wrist, unspecified laterality 03/03/2014   Personal history of COVID-19 04/2021   mild symptoms x 1 week all symptoms resolved   Prolapse of anterior vaginal wall    Tachycardia    Episodic   Upper back pain    Wears glasses for reading    Wears partial denture upper    Past Surgical History:  Procedure Laterality Date   ANTERIOR AND POSTERIOR REPAIR WITH SACROSPINOUS FIXATION N/A 09/14/2021   Procedure: ANTERIOR REPAIR WITH SACROSPINOUS FIXATION;  Surgeon: Marilynne Rosaline SAILOR, MD;  Location: [facility] [place];  Service: Gynecology;  Laterality: N/A;   APPENDECTOMY  77 yrs old   BUNIONECTOMY Bilateral    more than 40 yrs ago   colonscopy  2018   CYSTOSCOPY N/A 09/14/2021   Procedure: CYSTOSCOPY;  Surgeon: Marilynne Rosaline SAILOR, MD;  Location: [facility];  Service: Gynecology;  Laterality: N/A;   WISDOM TOOTH EXTRACTION  77 yrs old   Patient  Active Problem List   Diagnosis Date Noted   PAD (peripheral artery disease) (HCC) 08/18/2022   Myalgia 08/18/2022   Uterovaginal prolapse, incomplete 09/14/2021   Right calf pain 03/24/2021   Primary osteoarthritis of right knee 12/24/2019   Sciatica, right side 10/29/2019   RUQ pain 07/20/2018   Influenza A 07/18/2018   Senile ecchymosis 02/03/2018   Atypical chest pain 04/17/2017   Bronchitis, acute 01/09/2017   Heart murmur 12/07/2016   Chronic shoulder pain 12/07/2016   Epistaxis 09/30/2015   Tinnitus of right ear 09/30/2015   Left arm pain 09/30/2015   Left hip pain 09/30/2015   Generalized osteoarthritis of multiple sites 09/30/2015   Medicare annual wellness visit, subsequent 04/14/2015   Globus sensation 02/23/2015   Paresthesia 11/08/2014   Osteopenia 04/15/2014   Prediabetes 04/07/2014   Pain in the wrist, unspecified laterality 03/03/2014   Paresthesia of left arm 11/16/2013   Lateral epicondylitis of left elbow 04/19/2013   Hematochezia 04/19/2013   DDD (degenerative disc disease), cervical 05/16/2012   Preventative health care 03/31/2011   JOINT PAIN, HAND 07/23/2010   DISTURBANCE OF SKIN SENSATION 07/04/2009   Hyperlipidemia, mixed 07/04/2009   Constipation 12/02/2008   TENESMUS 12/02/2008   Tachycardia 11/07/2007  Essential hypertension 06/01/2007   ALLERGIC RHINITIS 06/01/2007    PCP: Jordan, [name], MD  REFERRING PROVIDER: Jordan, [name], MD   REFERRING DIAG: Bilateral shoulder pain, unspecified chronicity   THERAPY DIAG:  Bilateral shoulder pain, unspecified chronicity  Muscle weakness (generalized)  Rationale for Evaluation and Treatment: Rehabilitation  ONSET DATE: 7 yrs  SUBJECTIVE:                                                                                                                                                                                      SUBJECTIVE STATEMENT:  Doing well, making progress but it is slow and  steady. Still hard for me to get my jacket on this can be painful, but I am able to get my dishes up to the second cabinet which I wasn't able to do this before.    Hand dominance: Right  PERTINENT HISTORY: As per md: She has received shoulder injections from orthopedics in the past. Her condition has worsened since her last visit in January. (PCP)  PAIN:  Are you having pain? No 0/10 now, in some positions pain can still get high up to 5/10  PRECAUTIONS: None  RED FLAGS: None   WEIGHT BEARING RESTRICTIONS: No  FALLS:  Has patient fallen in last 6 months? No  LIVING ENVIRONMENT: Lives with: lives with their family and lives with their spouse Lives in: House/apartment   OCCUPATION: Retired school teachers  PLOF: Independent  PATIENT GOALS: Be able to sleep without pain when I move.  NEXT MD VISIT: as needed  OBJECTIVE:  Note: Objective measures were completed at Evaluation unless otherwise noted.  DIAGNOSTIC FINDINGS:  Feb 2024 IMPRESSION: This MRI of the cervical spine without contrast shows the following: The spinal cord appears normal. At C3-C4, there is central disc protrusion and mild uncovertebral spurring causing mild spinal stenosis but no nerve root compression.  The degenerative changes have progressed compared to the 2013 MRI. At C4-C5, there are stable degenerative changes causing mild spinal stenosis and moderate left foraminal narrowing encroaching on the left C5 nerve root without causing definite compression. At C5-C6 there are degenerative changes but no spinal stenosis or nerve root compression. At C6-C7, degenerative changes cause moderate right foraminal narrowing encroaching upon the right C6 nerve root without causing definite nerve root compression.  The degenerative changes have progressed compared to 2013.  PATIENT SURVEYS :  FOTO Primary score 43% with goal of 59%  COGNITION: Overall cognitive status: Within functional limits for tasks  assessed     SENSATION: WFL  POSTURE: Forward head and rounded shoulders  UPPER EXTREMITY ROM:   Active ROM Right eval Left eval  Rt/Lt 10/28 Rt/Lt 11/18 Right  Left   Shoulder flexion 109 105 120/114 130/100 132 118  Shoulder extension 41 24      Shoulder abduction 83 69      Shoulder adduction        Shoulder internal rotation s1 Mid glut   Mid glut  Lateral hip   Shoulder external rotation c7 c7   Occiput  c4  Elbow flexion        Elbow extension        Wrist flexion        Wrist extension        Wrist ulnar deviation        Wrist radial deviation        Wrist pronation        Wrist supination        (Blank rows = not tested)  UPPER EXTREMITY MMT:  MMT Right eval Left eval Right  Left   Shoulder flexion 3/5 in available range 3 limited to pain 4 3+  Shoulder extension      Shoulder abduction 3- limited to pain 3- limited to pain    Shoulder adduction      Shoulder internal rotation   4 3+  Shoulder external rotation   3+ 3  Middle trapezius 3 3    Lower trapezius      Elbow flexion 4P! 4P!    Elbow extension      Wrist flexion      Wrist extension      Wrist ulnar deviation      Wrist radial deviation      Wrist pronation      Wrist supination      Grip strength (lbs)      (Blank rows = not tested)  SHOULDER SPECIAL TESTS: Rotator cuff assessment: Empty can test: positive    PALPATION:  TTP   TODAY'S TREATMENT:      07/19/23  PROM as tolerated bil shoulders supine  GHJ mobilizations post and if glides grade II-III Supine wand flexion 2# 2x12 partial range  Supine chest press 2# 2x12 Seated press out 2x12ea Resisted row RTB 2x10  07/12/23  TherEx  UBE L1 x3 min forward/3 min backward with MinA to get started at first but then pain free- just hard  PROM as tolerated B shoulders supine  Supine wand flexion 2# 2x12 partial range  Supine chest press 2# 2x12 Backwards shoulder circles x20 cues for ROM Scapular retractions x15                                                                                                                                           07/05/23  PROM bil shoulders Supine wand press up 2# bar 2x10 Supine wand flexion 2# bar 2x10 partial range Seated scap squeeze x20 Seated press out 2x10ea  Bicep curls 2lb bar 2x10 Resisted row  RTB 2x10  Treatment                            12/12:  PROM bil shoulders Supine wand press up 2# bar 2x10 Supine wand flexion 2# bar 2x10 partial range Seated scap squeeze x20 Seated press out x10ea Bicep curls 2lb bar 2x10 Resisted row RTB 2x10  Treatment                            12/6:  PROM bil shoulders Supine wand press up 1# bar 2x10 Supine wand flexion 1# bar 2x10 Seated scap squeeze x20 Seated active shoulder flexion x10ea Chicken wing 2x10ea Seated press out x10ea Bicep curls 2lb bar 2x10   PATIENT EDUCATION: Education details: Discussed eval findings, rehab rationale, aquatic program progression/POC and pools in area. Patient is in agreement  Person educated: Patient Education method: Explanation Education comprehension: verbalized understanding  HOME EXERCISE PROGRAM: Access Code: [email or web address] URL: [email or web address]   ASSESSMENT:  CLINICAL IMPRESSION:  Pt remains painful at end ranges of movement. Greater limitations in L shoulder. Pt tolerates strengthening exercises within available pain free range. Will continue to work on improving functional ROM and strength.    Eval: Patient is a 77 y.o. f who was seen today for physical therapy evaluation and treatment for shoulder pain. She reports many years of discomfort receiving PT for in past without any improvement.  She has had steroid injections but did not return to ortho because she was not ready for any surgical procedure.  She presents today with limited bilateral ROM, pain and strength deficits throughout shoulders.  She has tenderness with palpation bilaterally about  subacromial bursa and rotator cuff insertion.  Pain is constant and interrupts sleep. She will benefit from skilled physical therapy to improve ue movement and strength allowing for decreased difficulty completing ADL's and decreasing pain.   OBJECTIVE IMPAIRMENTS: decreased coordination, decreased ROM, decreased strength, impaired UE functional use, and pain.   ACTIVITY LIMITATIONS: carrying, lifting, bathing, dressing, and reach over head  PARTICIPATION LIMITATIONS: meal prep, cleaning, and laundry  PERSONAL FACTORS: Age and Time since onset of injury/illness/exacerbation are also affecting patient's functional outcome.   REHAB POTENTIAL: Good  CLINICAL DECISION MAKING: Stable/uncomplicated  EVALUATION COMPLEXITY: Low  GOALS: Goals reviewed with patient? Yes  SHORT TERM GOALS: Target date: 05/27/23  Pt will be indep and consistent with HEP to demonstrate commitment to improvement  Baseline: Goal status:achieved 11/2 8  2.  Pt will report decreased pain with ADL's/combing hair Baseline:  Goal status: achieved 11/2 8  3.  Pt will report a decrease in shoulder pain by 2 NPRS to improve comfort Baseline: Pain can fluctuate above 08/1009/8  LONG TERM GOALS: Target date: 06/24/23  Pt to meet stated Foto Goal of 59% Baseline: 43% Goal status: Has achieved Foto goal 11/30  2.  Pt will improve shoulder flex and abd ROM by 20D Baseline: see chart Goal status: Improved 10 degrees 11/28  3.  Pt will improve shoulder and scapular strength by at least 1 grade Baseline: see chart Goal status: Still limited with external rotation and flexion 11/28  4.  Pt will report ability to reach up into cabinet with less discomfort Baseline:  Goal status: Improving mobility 11/28  5.  Pt will report reduced incidence of dropping objects/improved hand strength Baseline:  Goal status: Improving 11/28    PLAN: PT FREQUENCY: 1-2x/week  PT DURATION:  8 weeks12 visits.  Extended to allow for  scheduling conflicts  PLANNED INTERVENTIONS: Therapeutic exercises, Therapeutic activity, Neuromuscular re-education, Balance training, Gait training, Patient/Family education, Self Care, Joint mobilization, DME instructions, Aquatic Therapy, Dry Needling, Cryotherapy, Moist heat, Taping, Ionotophoresis 4mg /ml Dexamethasone , Manual therapy, and Re-evaluation  PLAN FOR NEXT SESSION: Land based: ROM and strengthening bilat ue shoulder complex. Consider TB with progressions as appropriate    Asberry Rodes, PTA  07/19/23 2:14 PM

## 2023-07-22 ENCOUNTER — Ambulatory Visit (HOSPITAL_BASED_OUTPATIENT_CLINIC_OR_DEPARTMENT_OTHER): Payer: 59 | Attending: Family Medicine

## 2023-07-22 ENCOUNTER — Encounter (HOSPITAL_BASED_OUTPATIENT_CLINIC_OR_DEPARTMENT_OTHER): Payer: Self-pay

## 2023-07-22 DIAGNOSIS — M6281 Muscle weakness (generalized): Secondary | ICD-10-CM | POA: Insufficient documentation

## 2023-07-22 DIAGNOSIS — M25511 Pain in right shoulder: Secondary | ICD-10-CM | POA: Insufficient documentation

## 2023-07-22 DIAGNOSIS — M25512 Pain in left shoulder: Secondary | ICD-10-CM | POA: Diagnosis not present

## 2023-07-22 NOTE — Therapy (Signed)
 OUTPATIENT PHYSICAL THERAPY UPPER EXTREMITY TREATMENT   Patient Name: Eileen Hansen MRN: [id number] DOB:August 08, 1945, 78 y.o., female Today's Date: 07/22/2023  END OF SESSION:  PT End of Session - 07/22/23 1220     Visit Number 18    Number of Visits 24    Date for PT Re-Evaluation 07/27/23    PT Start Time 1148    PT Stop Time 1230    PT Time Calculation (min) 42 min    Activity Tolerance Patient tolerated treatment well    Behavior During Therapy [facility] for tasks assessed/performed                             Past Medical History:  Diagnosis Date   Allergic rhinitis    Arthralgia 09/30/2015   Arthritis    fingers   Bruises easily and skin thin 09/11/2021   Chronic  bilateral shoulder pain left shoulder limited rom 12/07/2016   Heart murmur 12/07/2016   HTN (hypertension)    Lexiscan  Myoview  04/2021    Myoview  10/22: EF 78, no ischemia or infarction; low risk   mild Aortic valve insufficiency    Mild Mitral valve regurgitation    PAD (peripheral artery disease) (HCC)    Pain in the wrist, unspecified laterality 03/03/2014   Personal history of COVID-19 04/2021   mild symptoms x 1 week all symptoms resolved   Prolapse of anterior vaginal wall    Tachycardia    Episodic   Upper back pain    Wears glasses for reading    Wears partial denture upper    Past Surgical History:  Procedure Laterality Date   ANTERIOR AND POSTERIOR REPAIR WITH SACROSPINOUS FIXATION N/A 09/14/2021   Procedure: ANTERIOR REPAIR WITH SACROSPINOUS FIXATION;  Surgeon: Marilynne Rosaline SAILOR, MD;  Location: [facility] [place];  Service: Gynecology;  Laterality: N/A;   APPENDECTOMY  78 yrs old   BUNIONECTOMY Bilateral    more than 40 yrs ago   colonscopy  2018   CYSTOSCOPY N/A 09/14/2021   Procedure: CYSTOSCOPY;  Surgeon: Marilynne Rosaline SAILOR, MD;  Location: [facility];  Service: Gynecology;  Laterality: N/A;   WISDOM TOOTH EXTRACTION  78 yrs old   Patient  Active Problem List   Diagnosis Date Noted   PAD (peripheral artery disease) (HCC) 08/18/2022   Myalgia 08/18/2022   Uterovaginal prolapse, incomplete 09/14/2021   Right calf pain 03/24/2021   Primary osteoarthritis of right knee 12/24/2019   Sciatica, right side 10/29/2019   RUQ pain 07/20/2018   Influenza A 07/18/2018   Senile ecchymosis 02/03/2018   Atypical chest pain 04/17/2017   Bronchitis, acute 01/09/2017   Heart murmur 12/07/2016   Chronic shoulder pain 12/07/2016   Epistaxis 09/30/2015   Tinnitus of right ear 09/30/2015   Left arm pain 09/30/2015   Left hip pain 09/30/2015   Generalized osteoarthritis of multiple sites 09/30/2015   Medicare annual wellness visit, subsequent 04/14/2015   Globus sensation 02/23/2015   Paresthesia 11/08/2014   Osteopenia 04/15/2014   Prediabetes 04/07/2014   Pain in the wrist, unspecified laterality 03/03/2014   Paresthesia of left arm 11/16/2013   Lateral epicondylitis of left elbow 04/19/2013   Hematochezia 04/19/2013   DDD (degenerative disc disease), cervical 05/16/2012   Preventative health care 03/31/2011   JOINT PAIN, HAND 07/23/2010   DISTURBANCE OF SKIN SENSATION 07/04/2009   Hyperlipidemia, mixed 07/04/2009   Constipation 12/02/2008   TENESMUS 12/02/2008   Tachycardia 11/07/2007  Essential hypertension 06/01/2007   ALLERGIC RHINITIS 06/01/2007    PCP: Jordan, [name], MD  REFERRING PROVIDER: Jordan, [name], MD   REFERRING DIAG: Bilateral shoulder pain, unspecified chronicity   THERAPY DIAG:  Bilateral shoulder pain, unspecified chronicity  Muscle weakness (generalized)  Rationale for Evaluation and Treatment: Rehabilitation  ONSET DATE: 7 yrs  SUBJECTIVE:                                                                                                                                                                                      SUBJECTIVE STATEMENT:  Pt reports increased ability for reaching and  putting dishes away.    Hand dominance: Right  PERTINENT HISTORY: As per md: She has received shoulder injections from orthopedics in the past. Her condition has worsened since her last visit in January. (PCP)  PAIN:  Are you having pain? No 0/10 now, in some positions pain can still get high up to 5/10  PRECAUTIONS: None  RED FLAGS: None   WEIGHT BEARING RESTRICTIONS: No  FALLS:  Has patient fallen in last 6 months? No  LIVING ENVIRONMENT: Lives with: lives with their family and lives with their spouse Lives in: House/apartment   OCCUPATION: Retired school teachers  PLOF: Independent  PATIENT GOALS: Be able to sleep without pain when I move.  NEXT MD VISIT: as needed  OBJECTIVE:  Note: Objective measures were completed at Evaluation unless otherwise noted.  DIAGNOSTIC FINDINGS:  Feb 2024 IMPRESSION: This MRI of the cervical spine without contrast shows the following: The spinal cord appears normal. At C3-C4, there is central disc protrusion and mild uncovertebral spurring causing mild spinal stenosis but no nerve root compression.  The degenerative changes have progressed compared to the 2013 MRI. At C4-C5, there are stable degenerative changes causing mild spinal stenosis and moderate left foraminal narrowing encroaching on the left C5 nerve root without causing definite compression. At C5-C6 there are degenerative changes but no spinal stenosis or nerve root compression. At C6-C7, degenerative changes cause moderate right foraminal narrowing encroaching upon the right C6 nerve root without causing definite nerve root compression.  The degenerative changes have progressed compared to 2013.  PATIENT SURVEYS :  FOTO Primary score 43% with goal of 59%  COGNITION: Overall cognitive status: Within functional limits for tasks assessed     SENSATION: WFL  POSTURE: Forward head and rounded shoulders  UPPER EXTREMITY ROM:   Active ROM Right eval Left eval  Rt/Lt 10/28 Rt/Lt 11/18 Right  Left   Shoulder flexion 109 105 120/114 130/100 132 118  Shoulder extension 41 24      Shoulder abduction 83 69  Shoulder adduction        Shoulder internal rotation s1 Mid glut   Mid glut  Lateral hip   Shoulder external rotation c7 c7   Occiput  c4  Elbow flexion        Elbow extension        Wrist flexion        Wrist extension        Wrist ulnar deviation        Wrist radial deviation        Wrist pronation        Wrist supination        (Blank rows = not tested)  UPPER EXTREMITY MMT:  MMT Right eval Left eval Right  Left   Shoulder flexion 3/5 in available range 3 limited to pain 4 3+  Shoulder extension      Shoulder abduction 3- limited to pain 3- limited to pain    Shoulder adduction      Shoulder internal rotation   4 3+  Shoulder external rotation   3+ 3  Middle trapezius 3 3    Lower trapezius      Elbow flexion 4P! 4P!    Elbow extension      Wrist flexion      Wrist extension      Wrist ulnar deviation      Wrist radial deviation      Wrist pronation      Wrist supination      Grip strength (lbs)      (Blank rows = not tested)  SHOULDER SPECIAL TESTS: Rotator cuff assessment: Empty can test: positive    PALPATION:  TTP   TODAY'S TREATMENT:     07/22/23  PROM as tolerated bil shoulders supine  GHJ mobilizations post and if glides grade II-III L shoulder Supine wand flexion 2# 2x12 partial range  Supine chest press 2# 2x12 Seated press out 2x12ea Resisted row RTB 2x10   07/19/23  PROM as tolerated bil shoulders supine  GHJ mobilizations post and if glides grade II-III Supine wand flexion 2# 2x12 partial range  Supine chest press 2# 2x12 Seated press out 2x12ea Resisted row RTB 2x10  07/12/23  TherEx  UBE L1 x3 min forward/3 min backward with MinA to get started at first but then pain free- just hard  PROM as tolerated B shoulders supine  Supine wand flexion 2# 2x12 partial range  Supine chest  press 2# 2x12 Backwards shoulder circles x20 cues for ROM Scapular retractions x15                                                                                                                                          07/05/23  PROM bil shoulders Supine wand press up 2# bar 2x10 Supine wand flexion 2# bar 2x10 partial range Seated scap squeeze x20 Seated press out 2x10ea  Bicep curls  2lb bar 2x10 Resisted row RTB 2x10   PATIENT EDUCATION: Education details: Discussed eval findings, rehab rationale, aquatic program progression/POC and pools in area. Patient is in agreement  Person educated: Patient Education method: Explanation Education comprehension: verbalized understanding  HOME EXERCISE PROGRAM: Access Code: [email or web address] URL: [email or web address]   ASSESSMENT:  CLINICAL IMPRESSION:  Pt with improving tolerance for passive flexion and abduction bil. She does still experience painful crepitus with ER and IR bilaterally. Pt has greater difficulty with seated press out on L compared to R. She will benefit from continued PT to improve ROM and strength.    Eval: Patient is a 78 y.o. f who was seen today for physical therapy evaluation and treatment for shoulder pain. She reports many years of discomfort receiving PT for in past without any improvement.  She has had steroid injections but did not return to ortho because she was not ready for any surgical procedure.  She presents today with limited bilateral ROM, pain and strength deficits throughout shoulders.  She has tenderness with palpation bilaterally about subacromial bursa and rotator cuff insertion.  Pain is constant and interrupts sleep. She will benefit from skilled physical therapy to improve ue movement and strength allowing for decreased difficulty completing ADL's and decreasing pain.   OBJECTIVE IMPAIRMENTS: decreased coordination, decreased ROM, decreased strength, impaired UE functional use, and  pain.   ACTIVITY LIMITATIONS: carrying, lifting, bathing, dressing, and reach over head  PARTICIPATION LIMITATIONS: meal prep, cleaning, and laundry  PERSONAL FACTORS: Age and Time since onset of injury/illness/exacerbation are also affecting patient's functional outcome.   REHAB POTENTIAL: Good  CLINICAL DECISION MAKING: Stable/uncomplicated  EVALUATION COMPLEXITY: Low  GOALS: Goals reviewed with patient? Yes  SHORT TERM GOALS: Target date: 05/27/23  Pt will be indep and consistent with HEP to demonstrate commitment to improvement  Baseline: Goal status:achieved 11/2 8  2.  Pt will report decreased pain with ADL's/combing hair Baseline:  Goal status: achieved 11/2 8  3.  Pt will report a decrease in shoulder pain by 2 NPRS to improve comfort Baseline: Pain can fluctuate above 08/1009/8  LONG TERM GOALS: Target date: 06/24/23  Pt to meet stated Foto Goal of 59% Baseline: 43% Goal status: Has achieved Foto goal 11/30  2.  Pt will improve shoulder flex and abd ROM by 20D Baseline: see chart Goal status: Improved 10 degrees 11/28  3.  Pt will improve shoulder and scapular strength by at least 1 grade Baseline: see chart Goal status: Still limited with external rotation and flexion 11/28  4.  Pt will report ability to reach up into cabinet with less discomfort Baseline:  Goal status: Improving mobility 11/28  5.  Pt will report reduced incidence of dropping objects/improved hand strength Baseline:  Goal status: Improving 11/28    PLAN: PT FREQUENCY: 1-2x/week  PT DURATION: 8 weeks12 visits.  Extended to allow for scheduling conflicts  PLANNED INTERVENTIONS: Therapeutic exercises, Therapeutic activity, Neuromuscular re-education, Balance training, Gait training, Patient/Family education, Self Care, Joint mobilization, DME instructions, Aquatic Therapy, Dry Needling, Cryotherapy, Moist heat, Taping, Ionotophoresis 4mg /ml Dexamethasone , Manual therapy, and  Re-evaluation  PLAN FOR NEXT SESSION: Land based: ROM and strengthening bilat ue shoulder complex. Consider TB with progressions as appropriate    Asberry Rodes, PTA  07/22/23 2:26 PM

## 2023-07-26 ENCOUNTER — Ambulatory Visit (HOSPITAL_BASED_OUTPATIENT_CLINIC_OR_DEPARTMENT_OTHER): Payer: 59 | Admitting: Physical Therapy

## 2023-07-28 ENCOUNTER — Ambulatory Visit: Payer: 59 | Attending: Cardiology | Admitting: Pharmacist Clinician (PhC)/ Clinical Pharmacy Specialist

## 2023-07-28 ENCOUNTER — Encounter: Payer: Self-pay | Admitting: Pharmacist Clinician (PhC)/ Clinical Pharmacy Specialist

## 2023-07-28 DIAGNOSIS — E782 Mixed hyperlipidemia: Secondary | ICD-10-CM

## 2023-07-28 NOTE — Progress Notes (Signed)
 Office Visit    Patient Name: Eileen Hansen Date of Encounter: 07/28/2023  Primary Care Provider:  Jordan, [name], MD Primary Cardiologist:  Powell FORBES Sorrow, MD (Inactive)  Chief Complaint    Hyperlipidemia   Significant Past Medical History   PAD Mild L calf claudication, moderate on right  HTN Controlled on carvedilol , lisinopril , hctz  preDM 8/24 A1c 6.1 monitoring diet closely             Allergies  Allergen Reactions   Fenofibrate  Other (See Comments)    Abdominal pain   Penicillins Rash    REACTION: Hives Anaphylaxis    History of Present Illness    Eileen Hansen is a 78 y.o. female patient of Dr Darron, in the office today to discuss options for cholesterol management.  She saw Dr. Darron for PAD evaluation in September and it was determined to continue monitoring for now.  He started her on atorvastatin  10 mg, as she had previously failed rosuvastatin .  She only took this for a short time before myalgias developed again.    Patient is in office today, but not sure she wants further medication for cholesterol management.   Insurance Carrier:  [employer] H [phone] [id number] deductible, then $45/month  LDL Cholesterol goal:  LDL < 70  Current Medications:   none  Previously tried:  rosuvastatin , atorvastatin  - myalgias  Family Hx:   mother had MI, hypertension, HLD, father cancer, one brother also had MI   Accessory Clinical Findings   Lab Results  Component Value Date   CHOL 226 (H) 06/15/2023   HDL 32 (L) 06/15/2023   LDLCALC 165 (H) 06/15/2023   LDLDIRECT 152.0 06/25/2019   TRIG 156 (H) 06/15/2023   CHOLHDL 7.1 (H) 06/15/2023    No results found for: LIPOA  Lab Results  Component Value Date   ALT 15 06/15/2023   AST 17 06/15/2023   ALKPHOS 72 06/15/2023   BILITOT 0.5 06/15/2023   Lab Results  Component Value Date   CREATININE 0.67 03/16/2023   BUN 20 03/16/2023   NA 140 03/16/2023   K 4.1 03/16/2023   CL 103 03/16/2023   CO2 30  03/16/2023   Lab Results  Component Value Date   HGBA1C 6.1 03/16/2023    Home Medications    Current Outpatient Medications  Medication Sig Dispense Refill   Ascorbic Acid (VITAMIN C) 1000 MG tablet Take 1,000 mg by mouth daily.     aspirin  EC 81 MG tablet Take 81 mg by mouth daily. Swallow whole.     atorvastatin  (LIPITOR) 10 MG tablet Take 1 tablet (10 mg total) by mouth daily. 90 tablet 3   carvedilol  (COREG ) 12.5 MG tablet Take 1 tablet (12.5 mg total) by mouth 2 (two) times daily. 180 tablet 2   Cholecalciferol (VITAMIN D3) 25 MCG (1000 UT) CAPS Take 1 capsule by mouth daily.     hydrochlorothiazide  (HYDRODIURIL ) 25 MG tablet Take 1 tablet (25 mg total) by mouth daily. 90 tablet 2   lisinopril  (ZESTRIL ) 20 MG tablet Take 1 tablet (20 mg total) by mouth 2 (two) times daily. 180 tablet 2   Magnesium  200 MG TABS Take 1 tablet (200 mg total) by mouth at bedtime. 30 tablet 11   omega-3 acid ethyl esters (LOVAZA ) 1 g capsule TAKE 1 CAPSULE TWICE A DAY 180 capsule 3   vitamin B-12 (CYANOCOBALAMIN ) 1000 MCG tablet Take 1,000 mcg by mouth daily.     No current  facility-administered medications for this visit.     Assessment & Plan    Hyperlipidemia, mixed Assessment: Patient with ASCVD not at LDL goal of < 70 Most recent LDL 165 on 06/15/23 Not able to tolerate statins secondary to myalgias - atorvastatin , rosuvastatin  Had a long discussion on cholesterol, appropriate levels and medications to treat.  Discussed mechanisms of action, dosing, side effects, potential decreases in LDL cholesterol and costs for PCSK9 inhibitors and inclisiran  Plan: Patient would like to do her own research on the medications before making a decision.  She was given my phone number to call once she decides what she would like to do.      Jamelle Goldston, PharmD CPP [facility] [address]  [place], [place] [address] [phone]  07/28/2023, 4:36 PM

## 2023-07-28 NOTE — Patient Instructions (Signed)
 Your Results:             Your most recent labs Goal  Total Cholesterol 226 < 200  Triglycerides 156 < 150  HDL (happy/good cholesterol) 32 > 40  LDL (lousy/bad cholesterol 165 < 70   Medication changes:  Repatha - one injection every 14 days, done at home  Leqvio - first 2 doses are 3 months apart, then every 6 months after that   When you make a decision you can call me Karey) at [phone]  Lab orders:  We want to repeat labs after 2-3 months.  We will send you a lab order to remind you once we get closer to that time.       Thank you for choosing [facility]   High Triglycerides Eating Plan Triglycerides are a type of fat in the blood. High levels of triglycerides can increase your risk of heart disease and stroke. If your triglyceride levels are high, choosing the right foods can help lower your triglycerides and keep your heart healthy. Work with your health care provider or a dietitian to develop an eating plan that is right for you. What are tips for following this plan? General guidelines  Lose weight, if you are overweight. For most people, losing 5-10 lb (2-5 kg) helps lower triglyceride levels. A weight-loss plan may include: 30 minutes of exercise at least 5 days a week. Reducing the amount of calories, sugar, and fat you eat. Eat a wide variety of fresh fruits, vegetables, and whole grains. These foods are high in fiber. Eat foods that contain healthy fats, such as fatty fish, nuts, seeds, and olive oil. Avoid foods that are high in added sugar, added salt (sodium), and saturated fat. Avoid low-fiber, refined carbohydrates such as white bread, crackers, noodles, and white rice. Avoid foods with trans fats or partially hydrogenated oils, such as fried foods or stick margarine. If you drink alcohol: Limit how much you have to: 0-1 drink a day for women who are not pregnant. 0-2 drinks a day for men. Your health care provider may recommend that you drink less  than these amounts depending on your overall health. Know how much alcohol is in a drink. In the [place] one drink equals one 12 oz bottle of beer (355 mL), one 5 oz glass of wine (148 mL), or one 1 oz glass of hard liquor (44 mL). Reading food labels Check food labels for: The amount of saturated fat. Choose foods with no or very little saturated fat (less than 2 g). The amount of trans fat. Choose foods with no transfat. The amount of cholesterol. Choose foods that are low in cholesterol. The amount of sodium. Choose foods with less than 140 milligrams (mg) per serving. Shopping Buy dairy products labeled as nonfat (skim) or low-fat (1%). Avoid buying processed or prepackaged foods. These are often high in added sugar, sodium, and fat. Cooking Choose healthy fats when cooking, such as olive oil, avocado oil, or canola oil. Cook foods using lower fat methods, such as baking, broiling, boiling, or grilling. Make your own sauces, dressings, and marinades when possible, instead of buying them. Store-bought sauces, dressings, and marinades are often high in sodium and sugar. Meal planning Eat more home-cooked food and less restaurant, buffet, and fast food. Eat fatty fish at least 2 times each week. Examples of fatty fish include salmon, trout, sardines, mackerel, tuna, and herring. If you eat whole eggs, do not eat more than 4 egg yolks per week.  What foods should I eat? Fruits All fresh, canned (in natural juice), or frozen fruits. Vegetables Fresh or frozen vegetables. Low-sodium canned vegetables. Grains Whole wheat or whole grain breads, crackers, cereals, and pasta. Unsweetened oatmeal. Bulgur. Barley. Quinoa. Brown rice. Whole wheat flour tortillas. Meats and other proteins Skinless chicken or turkey. Ground chicken or turkey. Lean cuts of pork, trimmed of fat. Fish and seafood, especially salmon, trout, and herring. Egg whites. Dried beans, peas, or lentils. Unsalted nuts or seeds.  Unsalted canned beans. Natural peanut or almond butter or other nut butters. Dairy Low-fat dairy products. Skim or low-fat (1%) milk. Reduced fat (2%) and low-sodium cheese. Low-fat ricotta cheese. Low-fat cottage cheese. Plain, low-fat yogurt. Fats and oils Tub margarine without trans fats. Light or reduced-fat mayonnaise. Light or reduced-fat salad dressings. Avocado. Safflower, olive, sunflower, soybean, and canola oils. The items listed above may not be a complete list of recommended foods and beverages. Talk with your dietitian about what dietary choices are best for you. What foods should I avoid? Fruits Sweetened dried fruit. Canned fruit in syrup. Fruit juice. Vegetables Creamed or fried vegetables. Vegetables in a cheese sauce. Grains White bread. White (regular) pasta. White rice. Cornbread. Bagels. Pastries. Crackers that contain trans fat. Meats and other proteins Fatty cuts of meat. Ribs. Chicken wings. [place]. Sausage. Bologna. Salami. Chitterlings. Fatback. Hot dogs. Bratwurst. Packaged lunch meats. Dairy Whole or reduced-fat (2%) milk. Half-and-half. Cream cheese. Full-fat or sweetened yogurt. Full-fat cheese. Nondairy creamers. Whipped toppings. Processed cheese or cheese spreads. Cheese curds. Fats and oils Butter. Stick margarine. Lard. Shortening. Ghee. Bacon fat. Tropical oils, such as coconut, palm kernel, or palm oils. Beverages Alcohol. Sweetened drinks, such as soda, lemonade, fruit drinks, or punches. Sweets and desserts Corn syrup. Sugars. Honey. Molasses. Candy. Jam and jelly. Syrup. Sweetened cereals. Cookies. Pies. Cakes. Donuts. Muffins. Ice cream. Condiments Store-bought sauces, dressings, and marinades that are high in sugar, such as ketchup and barbecue sauce. The items listed above may not be a complete list of foods and beverages you should avoid. Talk with your dietitian about what dietary choices are best for you. Summary High levels of triglycerides  can increase the risk of heart disease and stroke. Choosing the right foods can help lower your triglycerides. Eat plenty of fresh fruits, vegetables, and whole grains. Choose low-fat dairy and lean meats. Eat fatty fish at least twice a week. Avoid processed and prepackaged foods with added sugar, sodium, saturated fat, and trans fat. If you need suggestions or have questions about what types of food are good for you, talk with your health care provider or a dietitian. This information is not intended to replace advice given to you by your health care provider. Make sure you discuss any questions you have with your health care provider. Document Revised: 11/14/2020 Document Reviewed: 11/14/2020 Elsevier Patient Education  2024 [employer].

## 2023-07-28 NOTE — Assessment & Plan Note (Signed)
 Assessment: Patient with ASCVD not at LDL goal of < 70 Most recent LDL 165 on 06/15/23 Not able to tolerate statins secondary to myalgias - atorvastatin , rosuvastatin  Had a long discussion on cholesterol, appropriate levels and medications to treat.  Discussed mechanisms of action, dosing, side effects, potential decreases in LDL cholesterol and costs for PCSK9 inhibitors and inclisiran  Plan: Patient would like to do her own research on the medications before making a decision.  She was given my phone number to call once she decides what she would like to do.

## 2023-07-29 ENCOUNTER — Ambulatory Visit (HOSPITAL_BASED_OUTPATIENT_CLINIC_OR_DEPARTMENT_OTHER): Payer: 59

## 2023-08-04 ENCOUNTER — Encounter (HOSPITAL_BASED_OUTPATIENT_CLINIC_OR_DEPARTMENT_OTHER): Payer: Self-pay | Admitting: Physical Therapy

## 2023-08-04 ENCOUNTER — Ambulatory Visit (HOSPITAL_BASED_OUTPATIENT_CLINIC_OR_DEPARTMENT_OTHER): Payer: 59 | Admitting: Physical Therapy

## 2023-08-04 DIAGNOSIS — M25511 Pain in right shoulder: Secondary | ICD-10-CM | POA: Diagnosis not present

## 2023-08-04 DIAGNOSIS — M25512 Pain in left shoulder: Secondary | ICD-10-CM | POA: Diagnosis not present

## 2023-08-04 DIAGNOSIS — M6281 Muscle weakness (generalized): Secondary | ICD-10-CM | POA: Diagnosis not present

## 2023-08-04 NOTE — Therapy (Signed)
OUTPATIENT PHYSICAL THERAPY UPPER EXTREMITY TREATMENT   Patient Name: Eileen Hansen MRN: [id number] DOB:1945/10/26, 78 y.o., female Today's Date: 08/04/2023  END OF SESSION:                    Past Medical History:  Diagnosis Date   Allergic rhinitis    Arthralgia 09/30/2015   Arthritis    fingers   Bruises easily and skin thin 09/11/2021   Chronic  bilateral shoulder pain left shoulder limited rom 12/07/2016   Heart murmur 12/07/2016   HTN (hypertension)    Lexiscan Myoview 04/2021    Myoview 10/22: EF 78, no ischemia or infarction; low risk   mild Aortic valve insufficiency    Mild Mitral valve regurgitation    PAD (peripheral artery disease) (HCC)    Pain in the wrist, unspecified laterality 03/03/2014   Personal history of COVID-19 04/2021   mild symptoms x 1 week all symptoms resolved   Prolapse of anterior vaginal wall    Tachycardia    Episodic   Upper back pain    Wears glasses for reading    Wears partial denture upper    Past Surgical History:  Procedure Laterality Date   ANTERIOR AND POSTERIOR REPAIR WITH SACROSPINOUS FIXATION N/A 09/14/2021   Procedure: ANTERIOR REPAIR WITH SACROSPINOUS FIXATION;  Surgeon: [name], MD;  Location: [facility];  Service: Gynecology;  Laterality: N/A;   APPENDECTOMY  78 yrs old   BUNIONECTOMY Bilateral    more than 40 yrs ago   colonscopy  2018   CYSTOSCOPY N/A 09/14/2021   Procedure: CYSTOSCOPY;  Surgeon: [name], MD;  Location: [facility];  Service: Gynecology;  Laterality: N/A;   WISDOM TOOTH EXTRACTION  78 yrs old   Patient Active Problem List   Diagnosis Date Noted   PAD (peripheral artery disease) (HCC) 08/18/2022   Myalgia 08/18/2022   Uterovaginal prolapse, incomplete 09/14/2021   Right calf pain 03/24/2021   Primary osteoarthritis of right knee 12/24/2019   Sciatica, right side 10/29/2019   RUQ pain 07/20/2018   Influenza A 07/18/2018    Senile ecchymosis 02/03/2018   Atypical chest pain 04/17/2017   Bronchitis, acute 01/09/2017   Heart murmur 12/07/2016   Chronic shoulder pain 12/07/2016   Epistaxis 09/30/2015   Tinnitus of right ear 09/30/2015   Left arm pain 09/30/2015   Left hip pain 09/30/2015   Generalized osteoarthritis of multiple sites 09/30/2015   Medicare annual wellness visit, subsequent 04/14/2015   Globus sensation 02/23/2015   Paresthesia 11/08/2014   Osteopenia 04/15/2014   Prediabetes 04/07/2014   Pain in the wrist, unspecified laterality 03/03/2014   Paresthesia of left arm 11/16/2013   Lateral epicondylitis of left elbow 04/19/2013   Hematochezia 04/19/2013   DDD (degenerative disc disease), cervical 05/16/2012   Preventative health care 03/31/2011   JOINT PAIN, HAND 07/23/2010   DISTURBANCE OF SKIN SENSATION 07/04/2009   Hyperlipidemia, mixed 07/04/2009   Constipation 12/02/2008   TENESMUS 12/02/2008   Tachycardia 11/07/2007   Essential hypertension 06/01/2007   ALLERGIC RHINITIS 06/01/2007    PCP: [name], [name], MD  REFERRING PROVIDER: [name], [name], MD   REFERRING DIAG: Bilateral shoulder pain, unspecified chronicity   THERAPY DIAG:  No diagnosis found.  Rationale for Evaluation and Treatment: Rehabilitation  ONSET DATE: 7 yrs  SUBJECTIVE:  SUBJECTIVE STATEMENT:  Pt reports increased ability for reaching and putting dishes away.    Hand dominance: Right  PERTINENT HISTORY: As per md: She has received shoulder injections from orthopedics in the past. Her condition has worsened since her last visit in January. (PCP)  PAIN:  Are you having pain? No 0/10 now, in some positions pain can still get high up to 5/10  PRECAUTIONS: None  RED FLAGS: None   WEIGHT BEARING RESTRICTIONS:  No  FALLS:  Has patient fallen in last 6 months? No  LIVING ENVIRONMENT: Lives with: lives with their family and lives with their spouse Lives in: House/apartment   OCCUPATION: Retired school teachers  PLOF: Independent  PATIENT GOALS: Be able to sleep without pain when I move.  NEXT MD VISIT: as needed  OBJECTIVE:  Note: Objective measures were completed at Evaluation unless otherwise noted.  DIAGNOSTIC FINDINGS:  Feb 2024 IMPRESSION: This MRI of the cervical spine without contrast shows the following: The spinal cord appears normal. At C3-C4, there is central disc protrusion and mild uncovertebral spurring causing mild spinal stenosis but no nerve root compression.  The degenerative changes have progressed compared to the 2013 MRI. At C4-C5, there are stable degenerative changes causing mild spinal stenosis and moderate left foraminal narrowing encroaching on the left C5 nerve root without causing definite compression. At C5-C6 there are degenerative changes but no spinal stenosis or nerve root compression. At C6-C7, degenerative changes cause moderate right foraminal narrowing encroaching upon the right C6 nerve root without causing definite nerve root compression.  The degenerative changes have progressed compared to 2013.  PATIENT SURVEYS :  FOTO Primary score 43% with goal of 59%  COGNITION: Overall cognitive status: Within functional limits for tasks assessed     SENSATION: WFL  POSTURE: Forward head and rounded shoulders  UPPER EXTREMITY ROM:   Active ROM Right eval Left eval Rt/Lt 10/28 Rt/Lt 11/18 Right  Left  Left 1/16  Shoulder flexion 109 105 120/114 130/100 132 118 115  Shoulder extension 41 24       Shoulder abduction 83 69       Shoulder adduction         Shoulder internal rotation s1 Mid glut   Mid glut  Lateral hip  To mid gluteal   Shoulder external rotation c7 c7   Occiput  c4 To occiput   Elbow flexion         Elbow extension          Wrist flexion         Wrist extension         Wrist ulnar deviation         Wrist radial deviation         Wrist pronation         Wrist supination         (Blank rows = not tested)  UPPER EXTREMITY ROM:  Passive ROM Right eval Left 1/16  Shoulder flexion  125   Shoulder extension    Shoulder abduction    Shoulder adduction    Shoulder extension    Shoulder internal rotation    Shoulder external rotation    Elbow flexion    Elbow extension  30 with significant crepitus   Wrist flexion    Wrist extension    Wrist ulnar deviation    Wrist radial deviation    Wrist pronation    Wrist supination     (Blank rows = not tested)  UPPER EXTREMITY MMT:  MMT Right eval Left eval Right  Left  Left 1/16   Shoulder flexion 3/5 in available range 3 limited to pain 4 3+ 4  Shoulder extension       Shoulder abduction 3- limited to pain 3- limited to pain     Shoulder adduction       Shoulder internal rotation   4 3+ 4  Shoulder external rotation   3+ 3 3+  Middle trapezius 3 3     Lower trapezius       Elbow flexion 4P! 4P!     Elbow extension       Wrist flexion       Wrist extension       Wrist ulnar deviation       Wrist radial deviation       Wrist pronation       Wrist supination       Grip strength (lbs)       (Blank rows = not tested)  SHOULDER SPECIAL TESTS: Rotator cuff assessment: Empty can test: positive    PALPATION:  TTP   TODAY'S TREATMENT:    1/16 Performed re-assessment on the shoulder   Manual: PROM into all planes  Supine chest press 2# 2x12  Supine wand flexion 2# 2x12     07/22/23  PROM as tolerated bil shoulders supine  GHJ mobilizations post and if glides grade II-III L shoulder Supine wand flexion 2# 2x12 partial range  Supine chest press 2# 2x12 Seated press out 2x12ea Resisted row RTB 2x10   07/19/23  PROM as tolerated bil shoulders supine  GHJ mobilizations post and if glides grade II-III Supine wand flexion 2# 2x12  partial range  Supine chest press 2# 2x12 Seated press out 2x12ea Resisted row RTB 2x10  07/12/23  TherEx  UBE L1 x3 min forward/3 min backward with MinA to get started at first but then pain free- just hard  PROM as tolerated B shoulders supine  Supine wand flexion 2# 2x12 partial range  Supine chest press 2# 2x12 Backwards shoulder circles x20 cues for ROM Scapular retractions x15                                                                                                                                          07/05/23  PROM bil shoulders Supine wand press up 2# bar 2x10 Supine wand flexion 2# bar 2x10 partial range Seated scap squeeze x20 Seated press out 2x10ea  Bicep curls 2lb bar 2x10 Resisted row RTB 2x10   PATIENT EDUCATION: Education details: Discussed eval findings, rehab rationale, aquatic program progression/POC and pools in area. Patient is in agreement  Person educated: Patient Education method: Explanation Education comprehension: verbalized understanding  HOME EXERCISE PROGRAM: Access Code: [email or web address] URL: [email or web address]   ASSESSMENT:  CLINICAL IMPRESSION: The patient is making progress. She is using her left arm for  functional tasks such as doing the dishes and cleaning. Sh feels like she is able reach overhead better with less pain She continues to have crepitus with certain movements. She is working hard on her exercises at home. She still has limitations in active and passive movements. She would benefit from further skilled therapy 1-2W6 to continue to work on functional strengthening and to finalize her HEP. We updated her HEP today. She was given a band to work with at home.    Eval: Patient is a 78 y.o. f who was seen today for physical therapy evaluation and treatment for shoulder pain. She reports many years of discomfort receiving PT for in past without any improvement.  She has had steroid injections but did not return  to ortho because she was not ready for any surgical procedure.  She presents today with limited bilateral ROM, pain and strength deficits throughout shoulders.  She has tenderness with palpation bilaterally about subacromial bursa and rotator cuff insertion.  Pain is constant and interrupts sleep. She will benefit from skilled physical therapy to improve ue movement and strength allowing for decreased difficulty completing ADL's and decreasing pain. See below for goal specific progress     OBJECTIVE IMPAIRMENTS: decreased coordination, decreased ROM, decreased strength, impaired UE functional use, and pain.   ACTIVITY LIMITATIONS: carrying, lifting, bathing, dressing, and reach over head  PARTICIPATION LIMITATIONS: meal prep, cleaning, and laundry  PERSONAL FACTORS: Age and Time since onset of injury/illness/exacerbation are also affecting patient's functional outcome.   REHAB POTENTIAL: Good  CLINICAL DECISION MAKING: Stable/uncomplicated  EVALUATION COMPLEXITY: Low  GOALS: Goals reviewed with patient? Yes  SHORT TERM GOALS: Target date: 05/27/23  Pt will be indep and consistent with HEP to demonstrate commitment to improvement  Baseline: Goal status:achieved 11/2 8  2.  Pt will report decreased pain with ADL's/combing hair Baseline:  Goal status: achieved 11/2 8  3.  Pt will report a decrease in shoulder pain by 2 NPRS to improve comfort Baseline: Improived pain with function but not consistent 1/16   LONG TERM GOALS: Target date: 06/24/23  Pt to meet stated Foto Goal of 59% Baseline: 43% Goal status: Has achieved Foto goal 11/30  2.  Pt will improve shoulder flex and abd ROM by 20D Baseline: see chart Goal status: Improved 10 degrees 11/28  3.  Pt will improve shoulder and scapular strength by at least 1 grade Baseline: see chart Goal status: Still limited with external rotation and flexion 11/28  4.  Pt will report ability to reach up into cabinet with less  discomfort Baseline:  Goal status: progressing 1/16  5.  Pt will report reduced incidence of dropping objects/improved hand strength Baseline:  Goal status: Improving improved overall function 1/16    PLAN: PT FREQUENCY: 1-2x/week  PT DURATION: 8 weeks12 visits.  Extended to allow for scheduling conflicts  PLANNED INTERVENTIONS: Therapeutic exercises, Therapeutic activity, Neuromuscular re-education, Balance training, Gait training, Patient/Family education, Self Care, Joint mobilization, DME instructions, Aquatic Therapy, Dry Needling, Cryotherapy, Moist heat, Taping, Ionotophoresis 4mg /ml Dexamethasone, Manual therapy, and Re-evaluation  PLAN FOR NEXT SESSION: Land based: ROM and strengthening bilat ue shoulder complex. Consider TB with progressions as appropriate      08/04/23 2:38 PM

## 2023-08-12 ENCOUNTER — Ambulatory Visit (HOSPITAL_BASED_OUTPATIENT_CLINIC_OR_DEPARTMENT_OTHER): Payer: Self-pay

## 2023-08-23 ENCOUNTER — Other Ambulatory Visit: Payer: Self-pay | Admitting: Family Medicine

## 2023-08-23 DIAGNOSIS — I10 Essential (primary) hypertension: Secondary | ICD-10-CM

## 2023-08-23 MED ORDER — HYDROCHLOROTHIAZIDE 25 MG PO TABS: 25.0000 mg | ORAL_TABLET | Freq: Every day | ORAL | 2 refills | Status: DC

## 2023-08-23 MED ORDER — CARVEDILOL 12.5 MG PO TABS: 12.5000 mg | ORAL_TABLET | Freq: Two times a day (BID) | ORAL | 2 refills | Status: DC

## 2023-08-23 NOTE — Telephone Encounter (Signed)
Last Fill: Carvedilol: 12/20/22     Hydrochlorothiazide: 12/06/22  Last OV: 06/22/23 Next OV: 03/02/24 AWV  Routing to provider for review/authorization.

## 2023-08-23 NOTE — Telephone Encounter (Signed)
 Copied from CRM [id number]. Topic: Clinical - Medication Refill >> Aug 23, 2023  1:45 PM Macario HERO wrote: Most Recent Primary Care Visit:  Provider: JORDAN, [name]  Department: [facility]  Visit Type: OFFICE VISIT  Date: 06/22/2023  Medication: carvedilol  (COREG ) 12.5 MG tablet [[id number]] - hydrochlorothiazide  (HYDRODIURIL ) 25 MG tablet [[id number]]  Has the patient contacted their pharmacy? Yes (Agent: If no, request that the patient contact the pharmacy for the refill. If patient does not wish to contact the pharmacy document the reason why and proceed with request.) (Agent: If yes, when and what did the pharmacy advise?) - call provider  Is this the correct pharmacy for this prescription? Yes If no, delete pharmacy and type the correct one.  This is the patient's preferred pharmacy:  [id number] - [place], [place] - [address] [place] [address] Phone: [phone] Fax: [phone]   Has the prescription been filled recently? No  Is the patient out of the medication? Yes  Has the patient been seen for an appointment in the last year OR does the patient have an upcoming appointment? Yes  Can we respond through MyChart? Yes  Agent: Please be advised that Rx refills may take up to 3 business days. We ask that you follow-up with your pharmacy.

## 2023-08-24 ENCOUNTER — Ambulatory Visit (HOSPITAL_BASED_OUTPATIENT_CLINIC_OR_DEPARTMENT_OTHER): Payer: Self-pay | Attending: Family Medicine | Admitting: Physical Therapy

## 2023-08-24 DIAGNOSIS — M6281 Muscle weakness (generalized): Secondary | ICD-10-CM | POA: Insufficient documentation

## 2023-08-24 DIAGNOSIS — M25512 Pain in left shoulder: Secondary | ICD-10-CM | POA: Insufficient documentation

## 2023-08-24 DIAGNOSIS — M25511 Pain in right shoulder: Secondary | ICD-10-CM | POA: Diagnosis not present

## 2023-08-24 NOTE — Therapy (Signed)
 OUTPATIENT PHYSICAL THERAPY UPPER EXTREMITY TREATMENT   Patient Name: Eileen Hansen MRN: [id number] DOB:1946-03-09, 78 y.o., female Today's Date: 08/25/2023  END OF SESSION:  PT End of Session - 08/25/23 2017     Visit Number 20    Number of Visits 24    Date for PT Re-Evaluation 09/15/23    Authorization Type progress noe done a visit 19    PT Start Time 1645    PT Stop Time 1728    PT Time Calculation (min) 43 min    Activity Tolerance Patient tolerated treatment well    Behavior During Therapy [facility] for tasks assessed/performed                              Past Medical History:  Diagnosis Date   Allergic rhinitis    Arthralgia 09/30/2015   Arthritis    fingers   Bruises easily and skin thin 09/11/2021   Chronic  bilateral shoulder pain left shoulder limited rom 12/07/2016   Heart murmur 12/07/2016   HTN (hypertension)    Lexiscan  Myoview  04/2021    Myoview  10/22: EF 78, no ischemia or infarction; low risk   mild Aortic valve insufficiency    Mild Mitral valve regurgitation    PAD (peripheral artery disease) (HCC)    Pain in the wrist, unspecified laterality 03/03/2014   Personal history of COVID-19 04/2021   mild symptoms x 1 week all symptoms resolved   Prolapse of anterior vaginal wall    Tachycardia    Episodic   Upper back pain    Wears glasses for reading    Wears partial denture upper    Past Surgical History:  Procedure Laterality Date   ANTERIOR AND POSTERIOR REPAIR WITH SACROSPINOUS FIXATION N/A 09/14/2021   Procedure: ANTERIOR REPAIR WITH SACROSPINOUS FIXATION;  Surgeon: Marilynne Rosaline SAILOR, MD;  Location: [facility] [place];  Service: Gynecology;  Laterality: N/A;   APPENDECTOMY  78 yrs old   BUNIONECTOMY Bilateral    more than 40 yrs ago   colonscopy  2018   CYSTOSCOPY N/A 09/14/2021   Procedure: CYSTOSCOPY;  Surgeon: Marilynne Rosaline SAILOR, MD;  Location: [facility];  Service: Gynecology;  Laterality: N/A;    WISDOM TOOTH EXTRACTION  78 yrs old   Patient Active Problem List   Diagnosis Date Noted   PAD (peripheral artery disease) (HCC) 08/18/2022   Myalgia 08/18/2022   Uterovaginal prolapse, incomplete 09/14/2021   Right calf pain 03/24/2021   Primary osteoarthritis of right knee 12/24/2019   Sciatica, right side 10/29/2019   RUQ pain 07/20/2018   Influenza A 07/18/2018   Senile ecchymosis 02/03/2018   Atypical chest pain 04/17/2017   Bronchitis, acute 01/09/2017   Heart murmur 12/07/2016   Chronic shoulder pain 12/07/2016   Epistaxis 09/30/2015   Tinnitus of right ear 09/30/2015   Left arm pain 09/30/2015   Left hip pain 09/30/2015   Generalized osteoarthritis of multiple sites 09/30/2015   Medicare annual wellness visit, subsequent 04/14/2015   Globus sensation 02/23/2015   Paresthesia 11/08/2014   Osteopenia 04/15/2014   Prediabetes 04/07/2014   Pain in the wrist, unspecified laterality 03/03/2014   Paresthesia of left arm 11/16/2013   Lateral epicondylitis of left elbow 04/19/2013   Hematochezia 04/19/2013   DDD (degenerative disc disease), cervical 05/16/2012   Preventative health care 03/31/2011   JOINT PAIN, HAND 07/23/2010   DISTURBANCE OF SKIN SENSATION 07/04/2009   Hyperlipidemia, mixed 07/04/2009  Constipation 12/02/2008   TENESMUS 12/02/2008   Tachycardia 11/07/2007   Essential hypertension 06/01/2007   ALLERGIC RHINITIS 06/01/2007    PCP: Jordan, [name], MD  REFERRING PROVIDER: Jordan, [name], MD   REFERRING DIAG: Bilateral shoulder pain, unspecified chronicity   THERAPY DIAG:  Bilateral shoulder pain, unspecified chronicity  Muscle weakness (generalized)  Rationale for Evaluation and Treatment: Rehabilitation  ONSET DATE: 7 yrs  SUBJECTIVE:                                                                                                                                                                                      SUBJECTIVE  STATEMENT:  The patient continues to rpeort improving ROM in both shoulders She is still having clicking and popping. She was advised that this might be the way it may be. .    Hand dominance: Right  PERTINENT HISTORY: As per md: She has received shoulder injections from orthopedics in the past. Her condition has worsened since her last visit in January. (PCP)  PAIN:  Are you having pain? No 0/10 now, in some positions pain can still get high up to 5/10  PRECAUTIONS: None  RED FLAGS: None   WEIGHT BEARING RESTRICTIONS: No  FALLS:  Has patient fallen in last 6 months? No  LIVING ENVIRONMENT: Lives with: lives with their family and lives with their spouse Lives in: House/apartment   OCCUPATION: Retired school teachers  PLOF: Independent  PATIENT GOALS: Be able to sleep without pain when I move.  NEXT MD VISIT: as needed  OBJECTIVE:  Note: Objective measures were completed at Evaluation unless otherwise noted.  DIAGNOSTIC FINDINGS:  Feb 2024 IMPRESSION: This MRI of the cervical spine without contrast shows the following: The spinal cord appears normal. At C3-C4, there is central disc protrusion and mild uncovertebral spurring causing mild spinal stenosis but no nerve root compression.  The degenerative changes have progressed compared to the 2013 MRI. At C4-C5, there are stable degenerative changes causing mild spinal stenosis and moderate left foraminal narrowing encroaching on the left C5 nerve root without causing definite compression. At C5-C6 there are degenerative changes but no spinal stenosis or nerve root compression. At C6-C7, degenerative changes cause moderate right foraminal narrowing encroaching upon the right C6 nerve root without causing definite nerve root compression.  The degenerative changes have progressed compared to 2013.  PATIENT SURVEYS :  FOTO Primary score 43% with goal of 59%  COGNITION: Overall cognitive status: Within functional limits  for tasks assessed     SENSATION: WFL  POSTURE: Forward head and rounded shoulders  UPPER EXTREMITY ROM:   Active ROM Right eval Left eval Rt/Lt 10/28  Rt/Lt 11/18 Right  Left  Left 1/16  Shoulder flexion 109 105 120/114 130/100 132 118 115  Shoulder extension 41 24       Shoulder abduction 83 69       Shoulder adduction         Shoulder internal rotation s1 Mid glut   Mid glut  Lateral hip  To mid gluteal   Shoulder external rotation c7 c7   Occiput  c4 To occiput   Elbow flexion         Elbow extension         Wrist flexion         Wrist extension         Wrist ulnar deviation         Wrist radial deviation         Wrist pronation         Wrist supination         (Blank rows = not tested)  UPPER EXTREMITY ROM:  Passive ROM Right eval Left 1/16  Shoulder flexion  125   Shoulder extension    Shoulder abduction    Shoulder adduction    Shoulder extension    Shoulder internal rotation    Shoulder external rotation    Elbow flexion    Elbow extension  30 with significant crepitus   Wrist flexion    Wrist extension    Wrist ulnar deviation    Wrist radial deviation    Wrist pronation    Wrist supination     (Blank rows = not tested)   UPPER EXTREMITY MMT:  MMT Right eval Left eval Right  Left  Left 1/16   Shoulder flexion 3/5 in available range 3 limited to pain 4 3+ 4  Shoulder extension       Shoulder abduction 3- limited to pain 3- limited to pain     Shoulder adduction       Shoulder internal rotation   4 3+ 4  Shoulder external rotation   3+ 3 3+  Middle trapezius 3 3     Lower trapezius       Elbow flexion 4P! 4P!     Elbow extension       Wrist flexion       Wrist extension       Wrist ulnar deviation       Wrist radial deviation       Wrist pronation       Wrist supination       Grip strength (lbs)       (Blank rows = not tested)  SHOULDER SPECIAL TESTS: Rotator cuff assessment: Empty can test: positive    PALPATION:   TTP   TODAY'S TREATMENT:    2/6 Manual: All PROM performed with distraction to reduce pain and improve movement Prom into all available ranges; trigger point release to upper trap  There-ex:  Bilateral er 3x10 yellow  Wand press 3x10 2lb    There-Act Wand flexion in available range supine 3x10   Neuro-re-ed:  Row 2x10  Shoulder extension 3x10 yellow Supine ABC bilateral  1/16  Performed re-assessment on the shoulder   Manual: PROM into all planes  Supine chest press 2# 2x12  Supine wand flexion 2# 2x12     07/22/23  PROM as tolerated bil shoulders supine  GHJ mobilizations post and if glides grade II-III L shoulder Supine wand flexion 2# 2x12 partial range  Supine chest press 2# 2x12 Seated press out  2x12ea Resisted row RTB 2x10   07/19/23  PROM as tolerated bil shoulders supine  GHJ mobilizations post and if glides grade II-III Supine wand flexion 2# 2x12 partial range  Supine chest press 2# 2x12 Seated press out 2x12ea Resisted row RTB 2x10  07/12/23  TherEx  UBE L1 x3 min forward/3 min backward with MinA to get started at first but then pain free- just hard  PROM as tolerated B shoulders supine  Supine wand flexion 2# 2x12 partial range  Supine chest press 2# 2x12 Backwards shoulder circles x20 cues for ROM Scapular retractions x15                                                                                                                                          07/05/23  PROM bil shoulders Supine wand press up 2# bar 2x10 Supine wand flexion 2# bar 2x10 partial range Seated scap squeeze x20 Seated press out 2x10ea  Bicep curls 2lb bar 2x10 Resisted row RTB 2x10   PATIENT EDUCATION: Education details: Discussed eval findings, rehab rationale, aquatic program progression/POC and pools in area. Patient is in agreement  Person educated: Patient Education method: Explanation Education comprehension: verbalized understanding  HOME  EXERCISE PROGRAM: Access Code: [email or web address] URL: [email or web address]   ASSESSMENT:  CLINICAL IMPRESSION: Patient is making progress.  Therapy has been supine ABC to improve multiplane reaching.  We also added an shoulder extension to improve scapulohumeral rhythm.  She continues to have significant popping in her shoulders.  This may be baseline.  Therapy spoke with the patient about.  Will continue to progress HEP.  Will continue to progress towards final HEP.  Eval: Patient is a 78 y.o. f who was seen today for physical therapy evaluation and treatment for shoulder pain. She reports many years of discomfort receiving PT for in past without any improvement.  She has had steroid injections but did not return to ortho because she was not ready for any surgical procedure.  She presents today with limited bilateral ROM, pain and strength deficits throughout shoulders.  She has tenderness with palpation bilaterally about subacromial bursa and rotator cuff insertion.  Pain is constant and interrupts sleep. She will benefit from skilled physical therapy to improve ue movement and strength allowing for decreased difficulty completing ADL's and decreasing pain. See below for goal specific progress     OBJECTIVE IMPAIRMENTS: decreased coordination, decreased ROM, decreased strength, impaired UE functional use, and pain.   ACTIVITY LIMITATIONS: carrying, lifting, bathing, dressing, and reach over head  PARTICIPATION LIMITATIONS: meal prep, cleaning, and laundry  PERSONAL FACTORS: Age and Time since onset of injury/illness/exacerbation are also affecting patient's functional outcome.   REHAB POTENTIAL: Good  CLINICAL DECISION MAKING: Stable/uncomplicated  EVALUATION COMPLEXITY: Low  GOALS: Goals reviewed with patient? Yes  SHORT TERM GOALS: Target date: 05/27/23  Pt will be indep and consistent with HEP to demonstrate commitment to  improvement  Baseline: Goal status:achieved 11/2  8  2.  Pt will report decreased pain with ADL's/combing hair Baseline:  Goal status: achieved 11/2 8  3.  Pt will report a decrease in shoulder pain by 2 NPRS to improve comfort Baseline: Improived pain with function but not consistent 1/16   LONG TERM GOALS: Target date: 06/24/23  Pt to meet stated Foto Goal of 59% Baseline: 43% Goal status: Has achieved Foto goal 11/30  2.  Pt will improve shoulder flex and abd ROM by 20D Baseline: see chart Goal status: Improved 10 degrees 11/28  3.  Pt will improve shoulder and scapular strength by at least 1 grade Baseline: see chart Goal status: Still limited with external rotation and flexion 11/28  4.  Pt will report ability to reach up into cabinet with less discomfort Baseline:  Goal status: progressing 1/16  5.  Pt will report reduced incidence of dropping objects/improved hand strength Baseline:  Goal status: Improving improved overall function 1/16    PLAN: PT FREQUENCY: 1-2x/week  PT DURATION: 8 weeks12 visits.  Extended to allow for scheduling conflicts  PLANNED INTERVENTIONS: Therapeutic exercises, Therapeutic activity, Neuromuscular re-education, Balance training, Gait training, Patient/Family education, Self Care, Joint mobilization, DME instructions, Aquatic Therapy, Dry Needling, Cryotherapy, Moist heat, Taping, Ionotophoresis 4mg /ml Dexamethasone , Manual therapy, and Re-evaluation  PLAN FOR NEXT SESSION: Land based: ROM and strengthening bilat ue shoulder complex. Consider TB with progressions as appropriate      08/25/23 8:27 PM

## 2023-08-25 ENCOUNTER — Encounter (HOSPITAL_BASED_OUTPATIENT_CLINIC_OR_DEPARTMENT_OTHER): Payer: Self-pay | Admitting: Physical Therapy

## 2023-09-02 ENCOUNTER — Ambulatory Visit (HOSPITAL_BASED_OUTPATIENT_CLINIC_OR_DEPARTMENT_OTHER): Payer: 59

## 2023-09-02 ENCOUNTER — Encounter (HOSPITAL_BASED_OUTPATIENT_CLINIC_OR_DEPARTMENT_OTHER): Payer: Self-pay

## 2023-09-02 DIAGNOSIS — M6281 Muscle weakness (generalized): Secondary | ICD-10-CM | POA: Diagnosis not present

## 2023-09-02 DIAGNOSIS — M25512 Pain in left shoulder: Secondary | ICD-10-CM | POA: Diagnosis not present

## 2023-09-02 DIAGNOSIS — M25511 Pain in right shoulder: Secondary | ICD-10-CM | POA: Diagnosis not present

## 2023-09-02 NOTE — Therapy (Signed)
OUTPATIENT PHYSICAL THERAPY UPPER EXTREMITY TREATMENT   Patient Name: Eileen Hansen MRN: [id number] DOB:[birth year], 78 y.o., female Today's Date: 09/02/2023  END OF SESSION:  PT End of Session - 09/02/23 1408     Visit Number 20    Number of Visits 24    Date for PT Re-Evaluation 09/15/23    Authorization Type progress note done a visit 19    PT Start Time 1347    PT Stop Time 1430    PT Time Calculation (min) 43 min    Activity Tolerance Patient tolerated treatment well    Behavior During Therapy [facility] for tasks assessed/performed                               Past Medical History:  Diagnosis Date   Allergic rhinitis    Arthralgia 09/30/2015   Arthritis    fingers   Bruises easily and skin thin 09/11/2021   Chronic  bilateral shoulder pain left shoulder limited rom 12/07/2016   Heart murmur 12/07/2016   HTN (hypertension)    Lexiscan Myoview 04/2021    Myoview 10/22: EF 78, no ischemia or infarction; low risk   mild Aortic valve insufficiency    Mild Mitral valve regurgitation    PAD (peripheral artery disease) (HCC)    Pain in the wrist, unspecified laterality 03/03/2014   Personal history of COVID-19 04/2021   mild symptoms x 1 week all symptoms resolved   Prolapse of anterior vaginal wall    Tachycardia    Episodic   Upper back pain    Wears glasses for reading    Wears partial denture upper    Past Surgical History:  Procedure Laterality Date   ANTERIOR AND POSTERIOR REPAIR WITH SACROSPINOUS FIXATION N/A 09/14/2021   Procedure: ANTERIOR REPAIR WITH SACROSPINOUS FIXATION;  Surgeon: [name], MD;  Location: [facility];  Service: Gynecology;  Laterality: N/A;   APPENDECTOMY  78 yrs old   BUNIONECTOMY Bilateral    more than 40 yrs ago   colonscopy  2018   CYSTOSCOPY N/A 09/14/2021   Procedure: CYSTOSCOPY;  Surgeon: [name], MD;  Location: [facility];  Service: Gynecology;  Laterality:  N/A;   WISDOM TOOTH EXTRACTION  78 yrs old   Patient Active Problem List   Diagnosis Date Noted   PAD (peripheral artery disease) (HCC) 08/18/2022   Myalgia 08/18/2022   Uterovaginal prolapse, incomplete 09/14/2021   Right calf pain 03/24/2021   Primary osteoarthritis of right knee 12/24/2019   Sciatica, right side 10/29/2019   RUQ pain 07/20/2018   Influenza A 07/18/2018   Senile ecchymosis 02/03/2018   Atypical chest pain 04/17/2017   Bronchitis, acute 01/09/2017   Heart murmur 12/07/2016   Chronic shoulder pain 12/07/2016   Epistaxis 09/30/2015   Tinnitus of right ear 09/30/2015   Left arm pain 09/30/2015   Left hip pain 09/30/2015   Generalized osteoarthritis of multiple sites 09/30/2015   Medicare annual wellness visit, subsequent 04/14/2015   Globus sensation 02/23/2015   Paresthesia 11/08/2014   Osteopenia 04/15/2014   Prediabetes 04/07/2014   Pain in the wrist, unspecified laterality 03/03/2014   Paresthesia of left arm 11/16/2013   Lateral epicondylitis of left elbow 04/19/2013   Hematochezia 04/19/2013   DDD (degenerative disc disease), cervical 05/16/2012   Preventative health care 03/31/2011   JOINT PAIN, HAND 07/23/2010   DISTURBANCE OF SKIN SENSATION 07/04/2009   Hyperlipidemia, mixed  07/04/2009   Constipation 12/02/2008   TENESMUS 12/02/2008   Tachycardia 11/07/2007   Essential hypertension 06/01/2007   ALLERGIC RHINITIS 06/01/2007    PCP: [name], [name], MD  REFERRING PROVIDER: [name], [name], MD   REFERRING DIAG: Bilateral shoulder pain, unspecified chronicity   THERAPY DIAG:  Bilateral shoulder pain, unspecified chronicity  Muscle weakness (generalized)  Rationale for Evaluation and Treatment: Rehabilitation  ONSET DATE: 7 yrs  SUBJECTIVE:                                                                                                                                                                                      SUBJECTIVE  STATEMENT:  Pt reports she has been using the therabands at home and finds them helpful. Has good days and bad days.    Hand dominance: Right  PERTINENT HISTORY: As per md: She has received shoulder injections from orthopedics in the past. Her condition has worsened since her last visit in January. (PCP)  PAIN:  Are you having pain? No 0/10 now, in some positions pain can still get high up to 5/10  PRECAUTIONS: None  RED FLAGS: None   WEIGHT BEARING RESTRICTIONS: No  FALLS:  Has patient fallen in last 6 months? No  LIVING ENVIRONMENT: Lives with: lives with their family and lives with their spouse Lives in: House/apartment   OCCUPATION: Retired school teachers  PLOF: Independent  PATIENT GOALS: Be able to sleep without pain when I move.  NEXT MD VISIT: as needed  OBJECTIVE:  Note: Objective measures were completed at Evaluation unless otherwise noted.  DIAGNOSTIC FINDINGS:  Feb 2024 IMPRESSION: This MRI of the cervical spine without contrast shows the following: The spinal cord appears normal. At C3-C4, there is central disc protrusion and mild uncovertebral spurring causing mild spinal stenosis but no nerve root compression.  The degenerative changes have progressed compared to the 2013 MRI. At C4-C5, there are stable degenerative changes causing mild spinal stenosis and moderate left foraminal narrowing encroaching on the left C5 nerve root without causing definite compression. At C5-C6 there are degenerative changes but no spinal stenosis or nerve root compression. At C6-C7, degenerative changes cause moderate right foraminal narrowing encroaching upon the right C6 nerve root without causing definite nerve root compression.  The degenerative changes have progressed compared to 2013.  PATIENT SURVEYS :  FOTO Primary score 43% with goal of 59%  COGNITION: Overall cognitive status: Within functional limits for tasks  assessed     SENSATION: WFL  POSTURE: Forward head and rounded shoulders  UPPER EXTREMITY ROM:   Active ROM Right eval Left eval Rt/Lt 10/28 Rt/Lt 11/18 Right  Left  Left  1/16  Shoulder flexion 109 105 120/114 130/100 132 118 115  Shoulder extension 41 24       Shoulder abduction 83 69       Shoulder adduction         Shoulder internal rotation s1 Mid glut   Mid glut  Lateral hip  To mid gluteal   Shoulder external rotation c7 c7   Occiput  c4 To occiput   Elbow flexion         Elbow extension         Wrist flexion         Wrist extension         Wrist ulnar deviation         Wrist radial deviation         Wrist pronation         Wrist supination         (Blank rows = not tested)  UPPER EXTREMITY ROM:  Passive ROM Right eval Left 1/16  Shoulder flexion  125   Shoulder extension    Shoulder abduction    Shoulder adduction    Shoulder extension    Shoulder internal rotation    Shoulder external rotation    Elbow flexion    Elbow extension  30 with significant crepitus   Wrist flexion    Wrist extension    Wrist ulnar deviation    Wrist radial deviation    Wrist pronation    Wrist supination     (Blank rows = not tested)   UPPER EXTREMITY MMT:  MMT Right eval Left eval Right  Left  Left 1/16   Shoulder flexion 3/5 in available range 3 limited to pain 4 3+ 4  Shoulder extension       Shoulder abduction 3- limited to pain 3- limited to pain     Shoulder adduction       Shoulder internal rotation   4 3+ 4  Shoulder external rotation   3+ 3 3+  Middle trapezius 3 3     Lower trapezius       Elbow flexion 4P! 4P!     Elbow extension       Wrist flexion       Wrist extension       Wrist ulnar deviation       Wrist radial deviation       Wrist pronation       Wrist supination       Grip strength (lbs)       (Blank rows = not tested)  SHOULDER SPECIAL TESTS: Rotator cuff assessment: Empty can test: positive    PALPATION:  TTP   TODAY'S  TREATMENT:     2/14 Manual: All PROM performed with distraction to reduce pain and improve movement Prom into all available ranges There-ex:  Seated press out 1lb L 2x10, 2lb R 2x10 Bicep curls 3# R 2x10, 2# 2x10L Wand press 2x10 2lb  Wand flexion in available range supine 2x12 2lb   There-Act   Neuro-re-ed:  Row RTB x30 Shoulder extension 3x10 yellow Supine ABC bilateral    2/6 Manual: All PROM performed with distraction to reduce pain and improve movement Prom into all available ranges; trigger point release to upper trap  There-ex:  Bilateral er 3x10 yellow  Wand press 3x10 2lb    There-Act Wand flexion in available range supine 3x10   Neuro-re-ed:  Row 2x10  Shoulder extension 3x10 yellow Supine ABC bilateral  1/16  Performed re-assessment on the shoulder   Manual: PROM into all planes  Supine chest press 2# 2x12  Supine wand flexion 2# 2x12     07/22/23  PROM as tolerated bil shoulders supine  GHJ mobilizations post and if glides grade II-III L shoulder Supine wand flexion 2# 2x12 partial range  Supine chest press 2# 2x12 Seated press out 2x12ea Resisted row RTB 2x10   07/19/23  PROM as tolerated bil shoulders supine  GHJ mobilizations post and if glides grade II-III Supine wand flexion 2# 2x12 partial range  Supine chest press 2# 2x12 Seated press out 2x12ea Resisted row RTB 2x10  07/12/23  TherEx  UBE L1 x3 min forward/3 min backward with MinA to get started at first but then pain free- just hard  PROM as tolerated B shoulders supine  Supine wand flexion 2# 2x12 partial range  Supine chest press 2# 2x12 Backwards shoulder circles x20 cues for ROM Scapular retractions x15                                                                                                                                           PATIENT EDUCATION: Education details: Discussed eval findings, rehab rationale, aquatic program progression/POC and pools  in area. Patient is in agreement  Person educated: Patient Education method: Explanation Education comprehension: verbalized understanding  HOME EXERCISE PROGRAM: Access Code: [email or web address] URL: [email or web address]   ASSESSMENT:  CLINICAL IMPRESSION: Good tolerance for PROM today. Weaker in L shoulder with resisted exercises, so modified by giving a lighter dumbbell. She is making good progress overall and will benefit from continued strengthening.   Eval: Patient is a 78 y.o. f who was seen today for physical therapy evaluation and treatment for shoulder pain. She reports many years of discomfort receiving PT for in past without any improvement.  She has had steroid injections but did not return to ortho because she was not ready for any surgical procedure.  She presents today with limited bilateral ROM, pain and strength deficits throughout shoulders.  She has tenderness with palpation bilaterally about subacromial bursa and rotator cuff insertion.  Pain is constant and interrupts sleep. She will benefit from skilled physical therapy to improve ue movement and strength allowing for decreased difficulty completing ADL's and decreasing pain. See below for goal specific progress     OBJECTIVE IMPAIRMENTS: decreased coordination, decreased ROM, decreased strength, impaired UE functional use, and pain.   ACTIVITY LIMITATIONS: carrying, lifting, bathing, dressing, and reach over head  PARTICIPATION LIMITATIONS: meal prep, cleaning, and laundry  PERSONAL FACTORS: Age and Time since onset of injury/illness/exacerbation are also affecting patient's functional outcome.   REHAB POTENTIAL: Good  CLINICAL DECISION MAKING: Stable/uncomplicated  EVALUATION COMPLEXITY: Low  GOALS: Goals reviewed with patient? Yes  SHORT TERM GOALS: Target date: 05/27/23  Pt will be indep and consistent with HEP to demonstrate commitment to improvement  Baseline: Goal status:achieved  11/2 8  2.   Pt will report decreased pain with ADL's/combing hair Baseline:  Goal status: achieved 11/2 8  3.  Pt will report a decrease in shoulder pain by 2 NPRS to improve comfort Baseline: Improived pain with function but not consistent 1/16   LONG TERM GOALS: Target date: 06/24/23  Pt to meet stated Foto Goal of 59% Baseline: 43% Goal status: Has achieved Foto goal 11/30  2.  Pt will improve shoulder flex and abd ROM by 20D Baseline: see chart Goal status: Improved 10 degrees 11/28  3.  Pt will improve shoulder and scapular strength by at least 1 grade Baseline: see chart Goal status: Still limited with external rotation and flexion 11/28  4.  Pt will report ability to reach up into cabinet with less discomfort Baseline:  Goal status: progressing 1/16  5.  Pt will report reduced incidence of dropping objects/improved hand strength Baseline:  Goal status: Improving improved overall function 1/16    PLAN: PT FREQUENCY: 1-2x/week  PT DURATION: 8 weeks12 visits.  Extended to allow for scheduling conflicts  PLANNED INTERVENTIONS: Therapeutic exercises, Therapeutic activity, Neuromuscular re-education, Balance training, Gait training, Patient/Family education, Self Care, Joint mobilization, DME instructions, Aquatic Therapy, Dry Needling, Cryotherapy, Moist heat, Taping, Ionotophoresis 4mg /ml Dexamethasone, Manual therapy, and Re-evaluation  PLAN FOR NEXT SESSION: Land based: ROM and strengthening bilat ue shoulder complex. Consider TB with progressions as appropriate      Lenore Manner, PTA 09/02/2023, 3:02 PM

## 2023-09-09 ENCOUNTER — Ambulatory Visit (HOSPITAL_BASED_OUTPATIENT_CLINIC_OR_DEPARTMENT_OTHER): Payer: 59

## 2023-09-15 ENCOUNTER — Other Ambulatory Visit: Payer: Self-pay | Admitting: Family Medicine

## 2023-09-15 DIAGNOSIS — I10 Essential (primary) hypertension: Secondary | ICD-10-CM

## 2023-09-15 DIAGNOSIS — I340 Nonrheumatic mitral (valve) insufficiency: Secondary | ICD-10-CM

## 2023-09-15 DIAGNOSIS — Z79899 Other long term (current) drug therapy: Secondary | ICD-10-CM

## 2023-09-15 DIAGNOSIS — I739 Peripheral vascular disease, unspecified: Secondary | ICD-10-CM

## 2023-09-15 DIAGNOSIS — E7800 Pure hypercholesterolemia, unspecified: Secondary | ICD-10-CM

## 2023-09-15 DIAGNOSIS — R002 Palpitations: Secondary | ICD-10-CM

## 2023-09-15 DIAGNOSIS — I351 Nonrheumatic aortic (valve) insufficiency: Secondary | ICD-10-CM

## 2023-09-15 NOTE — Telephone Encounter (Signed)
 Copied from CRM [id number]. Topic: Clinical - Medication Refill >> Sep 15, 2023  4:49 PM Denese Killings wrote: Most Recent Primary Care Visit:  Provider: [name], [name]  Department: [facility]  Visit Type: OFFICE VISIT  Date: 06/22/2023  Medication: omega-3 acid ethyl esters (LOVAZA) 1 g capsule lisinopril (ZESTRIL) 20 MG tablet  Has the patient contacted their pharmacy? Yes (Agent: If no, request that the patient contact the pharmacy for the refill. If patient does not wish to contact the pharmacy document the reason why and proceed with request.) (Agent: If yes, when and what did the pharmacy advise?) need doctor appoval  Is this the correct pharmacy for this prescription? Yes If no, delete pharmacy and type the correct one.  This is the patient's preferred pharmacy:  [id number] - [place], [place] - [address] [place] [address] Phone: [phone] Fax: [phone]  Has the prescription been filled recently? No  Is the patient out of the medication? no  Has the patient been seen for an appointment in the last year OR does the patient have an upcoming appointment? Yes  Can we respond through MyChart? Yes  Agent: Please be advised that Rx refills may take up to 3 business days. We ask that you follow-up with your pharmacy.

## 2023-09-16 ENCOUNTER — Encounter (HOSPITAL_BASED_OUTPATIENT_CLINIC_OR_DEPARTMENT_OTHER): Payer: Self-pay | Admitting: Physical Therapy

## 2023-09-16 ENCOUNTER — Ambulatory Visit (HOSPITAL_BASED_OUTPATIENT_CLINIC_OR_DEPARTMENT_OTHER): Payer: 59 | Admitting: Physical Therapy

## 2023-09-16 DIAGNOSIS — M6281 Muscle weakness (generalized): Secondary | ICD-10-CM | POA: Diagnosis not present

## 2023-09-16 DIAGNOSIS — M25511 Pain in right shoulder: Secondary | ICD-10-CM | POA: Diagnosis not present

## 2023-09-16 DIAGNOSIS — M25512 Pain in left shoulder: Secondary | ICD-10-CM | POA: Diagnosis not present

## 2023-09-16 MED ORDER — OMEGA-3-ACID ETHYL ESTERS 1 G PO CAPS: 1.0000 | ORAL_CAPSULE | Freq: Two times a day (BID) | ORAL | 3 refills | Status: DC

## 2023-09-16 NOTE — Therapy (Signed)
 OUTPATIENT PHYSICAL THERAPY UPPER EXTREMITY TREATMENT   Patient Name: Eileen Hansen MRN: [id number] DOB:01-29-1946, 78 y.o., female Today's Date: 09/16/2023  END OF SESSION:  PT End of Session - 09/16/23 1627     Visit Number 21    Number of Visits 24    Date for PT Re-Evaluation 09/15/23    Authorization Type progress note done a visit 19    PT Start Time 1517    PT Stop Time 1558    PT Time Calculation (min) 41 min    Activity Tolerance Patient tolerated treatment well    Behavior During Therapy [facility] for tasks assessed/performed                                Past Medical History:  Diagnosis Date   Allergic rhinitis    Arthralgia 09/30/2015   Arthritis    fingers   Bruises easily and skin thin 09/11/2021   Chronic  bilateral shoulder pain left shoulder limited rom 12/07/2016   Heart murmur 12/07/2016   HTN (hypertension)    Lexiscan Myoview 04/2021    Myoview 10/22: EF 78, no ischemia or infarction; low risk   mild Aortic valve insufficiency    Mild Mitral valve regurgitation    PAD (peripheral artery disease) (HCC)    Pain in the wrist, unspecified laterality 03/03/2014   Personal history of COVID-19 04/2021   mild symptoms x 1 week all symptoms resolved   Prolapse of anterior vaginal wall    Tachycardia    Episodic   Upper back pain    Wears glasses for reading    Wears partial denture upper    Past Surgical History:  Procedure Laterality Date   ANTERIOR AND POSTERIOR REPAIR WITH SACROSPINOUS FIXATION N/A 09/14/2021   Procedure: ANTERIOR REPAIR WITH SACROSPINOUS FIXATION;  Surgeon: [name], MD;  Location: [facility];  Service: Gynecology;  Laterality: N/A;   APPENDECTOMY  78 yrs old   BUNIONECTOMY Bilateral    more than 40 yrs ago   colonscopy  2018   CYSTOSCOPY N/A 09/14/2021   Procedure: CYSTOSCOPY;  Surgeon: [name], MD;  Location: [facility];  Service: Gynecology;   Laterality: N/A;   WISDOM TOOTH EXTRACTION  78 yrs old   Patient Active Problem List   Diagnosis Date Noted   PAD (peripheral artery disease) (HCC) 08/18/2022   Myalgia 08/18/2022   Uterovaginal prolapse, incomplete 09/14/2021   Right calf pain 03/24/2021   Primary osteoarthritis of right knee 12/24/2019   Sciatica, right side 10/29/2019   RUQ pain 07/20/2018   Influenza A 07/18/2018   Senile ecchymosis 02/03/2018   Atypical chest pain 04/17/2017   Bronchitis, acute 01/09/2017   Heart murmur 12/07/2016   Chronic shoulder pain 12/07/2016   Epistaxis 09/30/2015   Tinnitus of right ear 09/30/2015   Left arm pain 09/30/2015   Left hip pain 09/30/2015   Generalized osteoarthritis of multiple sites 09/30/2015   Medicare annual wellness visit, subsequent 04/14/2015   Globus sensation 02/23/2015   Paresthesia 11/08/2014   Osteopenia 04/15/2014   Prediabetes 04/07/2014   Pain in the wrist, unspecified laterality 03/03/2014   Paresthesia of left arm 11/16/2013   Lateral epicondylitis of left elbow 04/19/2013   Hematochezia 04/19/2013   DDD (degenerative disc disease), cervical 05/16/2012   Preventative health care 03/31/2011   JOINT PAIN, HAND 07/23/2010   DISTURBANCE OF SKIN SENSATION 07/04/2009   Hyperlipidemia,  mixed 07/04/2009   Constipation 12/02/2008   TENESMUS 12/02/2008   Tachycardia 11/07/2007   Essential hypertension 06/01/2007   ALLERGIC RHINITIS 06/01/2007    PCP: [name], [name], MD  REFERRING PROVIDER: [name], [name], MD   REFERRING DIAG: Bilateral shoulder pain, unspecified chronicity   THERAPY DIAG:  Bilateral shoulder pain, unspecified chronicity  Muscle weakness (generalized)  Rationale for Evaluation and Treatment: Rehabilitation  ONSET DATE: 7 yrs  SUBJECTIVE:                                                                                                                                                                                       SUBJECTIVE STATEMENT:  The patient reports her left shoulder has been sore all week.   Hand dominance: Right  PERTINENT HISTORY: As per md: She has received shoulder injections from orthopedics in the past. Her condition has worsened since her last visit in January. (PCP)  PAIN:  Are you having pain? No 0/10 now, in some positions pain can still get high up to 5/10  PRECAUTIONS: None  RED FLAGS: None   WEIGHT BEARING RESTRICTIONS: No  FALLS:  Has patient fallen in last 6 months? No  LIVING ENVIRONMENT: Lives with: lives with their family and lives with their spouse Lives in: House/apartment   OCCUPATION: Retired school teachers  PLOF: Independent  PATIENT GOALS: Be able to sleep without pain when I move.  NEXT MD VISIT: as needed  OBJECTIVE:  Note: Objective measures were completed at Evaluation unless otherwise noted.  DIAGNOSTIC FINDINGS:  Feb 2024 IMPRESSION: This MRI of the cervical spine without contrast shows the following: The spinal cord appears normal. At C3-C4, there is central disc protrusion and mild uncovertebral spurring causing mild spinal stenosis but no nerve root compression.  The degenerative changes have progressed compared to the 2013 MRI. At C4-C5, there are stable degenerative changes causing mild spinal stenosis and moderate left foraminal narrowing encroaching on the left C5 nerve root without causing definite compression. At C5-C6 there are degenerative changes but no spinal stenosis or nerve root compression. At C6-C7, degenerative changes cause moderate right foraminal narrowing encroaching upon the right C6 nerve root without causing definite nerve root compression.  The degenerative changes have progressed compared to 2013.  PATIENT SURVEYS :  FOTO Primary score 43% with goal of 59%  COGNITION: Overall cognitive status: Within functional limits for tasks assessed     SENSATION: WFL  POSTURE: Forward head and rounded  shoulders  UPPER EXTREMITY ROM:   Active ROM Right eval Left eval Rt/Lt 10/28 Rt/Lt 11/18 Right  Left  Left 1/16  Shoulder flexion 109 105 120/114 130/100 132  118 115  Shoulder extension 41 24       Shoulder abduction 83 69       Shoulder adduction         Shoulder internal rotation s1 Mid glut   Mid glut  Lateral hip  To mid gluteal   Shoulder external rotation c7 c7   Occiput  c4 To occiput   Elbow flexion         Elbow extension         Wrist flexion         Wrist extension         Wrist ulnar deviation         Wrist radial deviation         Wrist pronation         Wrist supination         (Blank rows = not tested)  UPPER EXTREMITY ROM:  Passive ROM Right eval Left 1/16  Shoulder flexion  125   Shoulder extension    Shoulder abduction    Shoulder adduction    Shoulder extension    Shoulder internal rotation    Shoulder external rotation    Elbow flexion    Elbow extension  30 with significant crepitus   Wrist flexion    Wrist extension    Wrist ulnar deviation    Wrist radial deviation    Wrist pronation    Wrist supination     (Blank rows = not tested)   UPPER EXTREMITY MMT:  MMT Right eval Left eval Right  Left  Left 1/16   Shoulder flexion 3/5 in available range 3 limited to pain 4 3+ 4  Shoulder extension       Shoulder abduction 3- limited to pain 3- limited to pain     Shoulder adduction       Shoulder internal rotation   4 3+ 4  Shoulder external rotation   3+ 3 3+  Middle trapezius 3 3     Lower trapezius       Elbow flexion 4P! 4P!     Elbow extension       Wrist flexion       Wrist extension       Wrist ulnar deviation       Wrist radial deviation       Wrist pronation       Wrist supination       Grip strength (lbs)       (Blank rows = not tested)  SHOULDER SPECIAL TESTS: Rotator cuff assessment: Empty can test: positive    PALPATION:  TTP   TODAY'S TREATMENT:     2/28 Manual: All PROM performed with distraction to  reduce pain and improve movement; trigger point release to upper trap/ posterior RTC and scapular area; and anterior shoulder. Grade I and II inferior and posterior glide to reduce pain. Reviewed self dsoft tissue mobilization with thera-cane   There-ex:  Pulley's flexion in pain free range 2 min   Neuro-re ed  Pendulums 5 min wth mod cuing  Table reaches in multiple directions pain free   Reviewed HEP and how to use the things from today to reduce pain.   2/14 Manual: All PROM performed with distraction to reduce pain and improve movement Prom into all available ranges There-ex:  Seated press out 1lb L 2x10, 2lb R 2x10 Bicep curls 3# R 2x10, 2# 2x10L Wand press 2x10 2lb  Wand flexion in available range supine 2x12 2lb  There-Act   Neuro-re-ed:  Row RTB x30 Shoulder extension 3x10 yellow Supine ABC bilateral    2/6 Manual: All PROM performed with distraction to reduce pain and improve movement Prom into all available ranges; trigger point release to upper trap  There-ex:  Bilateral er 3x10 yellow  Wand press 3x10 2lb    There-Act Wand flexion in available range supine 3x10   Neuro-re-ed:  Row 2x10  Shoulder extension 3x10 yellow Supine ABC bilateral  1/16  Performed re-assessment on the shoulder   Manual: PROM into all planes  Supine chest press 2# 2x12  Supine wand flexion 2# 2x12     07/22/23  PROM as tolerated bil shoulders supine  GHJ mobilizations post and if glides grade II-III L shoulder Supine wand flexion 2# 2x12 partial range  Supine chest press 2# 2x12 Seated press out 2x12ea Resisted row RTB 2x10   07/19/23  PROM as tolerated bil shoulders supine  GHJ mobilizations post and if glides grade II-III Supine wand flexion 2# 2x12 partial range  Supine chest press 2# 2x12 Seated press out 2x12ea Resisted row RTB 2x10  07/12/23  TherEx  UBE L1 x3 min forward/3 min backward with MinA to get started at first but then pain free- just  hard  PROM as tolerated B shoulders supine  Supine wand flexion 2# 2x12 partial range  Supine chest press 2# 2x12 Backwards shoulder circles x20 cues for ROM Scapular retractions x15                                                                                                                                           PATIENT EDUCATION: Education details: Discussed eval findings, rehab rationale, aquatic program progression/POC and pools in area. Patient is in agreement  Person educated: Patient Education method: Explanation Education comprehension: verbalized understanding  HOME EXERCISE PROGRAM: Access Code: [email or web address] URL: [email or web address]   ASSESSMENT:  CLINICAL IMPRESSION: The patient had improved movement following treatment. She came in with significant tightness and limited ROM. We reviewed 4 things she can do when she is really sore. At this time she continues to have significant crepitus in her shoulder. She was advised she is likely going to continue to have flares given the severity of her OA. She was advised that she can use the things discussed today to improve her pain and stop the cycle of declining ROM and stiffness when she is becoming ore painful.   Eval: Patient is a 78 y.o. f who was seen today for physical therapy evaluation and treatment for shoulder pain. She reports many years of discomfort receiving PT for in past without any improvement.  She has had steroid injections but did not return to ortho because she was not ready for any surgical procedure.  She presents today with limited bilateral ROM, pain and strength deficits throughout shoulders.  She has tenderness with palpation bilaterally about subacromial bursa  and rotator cuff insertion.  Pain is constant and interrupts sleep. She will benefit from skilled physical therapy to improve ue movement and strength allowing for decreased difficulty completing ADL's and decreasing pain. See below  for goal specific progress     OBJECTIVE IMPAIRMENTS: decreased coordination, decreased ROM, decreased strength, impaired UE functional use, and pain.   ACTIVITY LIMITATIONS: carrying, lifting, bathing, dressing, and reach over head  PARTICIPATION LIMITATIONS: meal prep, cleaning, and laundry  PERSONAL FACTORS: Age and Time since onset of injury/illness/exacerbation are also affecting patient's functional outcome.   REHAB POTENTIAL: Good  CLINICAL DECISION MAKING: Stable/uncomplicated  EVALUATION COMPLEXITY: Low  GOALS: Goals reviewed with patient? Yes  SHORT TERM GOALS: Target date: 05/27/23  Pt will be indep and consistent with HEP to demonstrate commitment to improvement  Baseline: Goal status:achieved 11/2 8  2.  Pt will report decreased pain with ADL's/combing hair Baseline:  Goal status: achieved 11/2 8  3.  Pt will report a decrease in shoulder pain by 2 NPRS to improve comfort Baseline: Improived pain with function but not consistent 1/16   LONG TERM GOALS: Target date: 06/24/23  Pt to meet stated Foto Goal of 59% Baseline: 43% Goal status: Has achieved Foto goal 11/30  2.  Pt will improve shoulder flex and abd ROM by 20D Baseline: see chart Goal status: Improved 10 degrees 11/28  3.  Pt will improve shoulder and scapular strength by at least 1 grade Baseline: see chart Goal status: Still limited with external rotation and flexion 11/28  4.  Pt will report ability to reach up into cabinet with less discomfort Baseline:  Goal status: progressing 1/16  5.  Pt will report reduced incidence of dropping objects/improved hand strength Baseline:  Goal status: Improving improved overall function 1/16    PLAN: PT FREQUENCY: 1-2x/week  PT DURATION: 8 weeks12 visits.  Extended to allow for scheduling conflicts  PLANNED INTERVENTIONS: Therapeutic exercises, Therapeutic activity, Neuromuscular re-education, Balance training, Gait training, Patient/Family  education, Self Care, Joint mobilization, DME instructions, Aquatic Therapy, Dry Needling, Cryotherapy, Moist heat, Taping, Ionotophoresis 4mg /ml Dexamethasone, Manual therapy, and Re-evaluation  PLAN FOR NEXT SESSION: Land based: ROM and strengthening bilat ue shoulder complex. Consider TB with progressions as appropriate      Dessie Coma, PT 09/16/2023, 4:28 PM

## 2023-09-17 MED ORDER — LISINOPRIL 20 MG PO TABS: 20.0000 mg | ORAL_TABLET | Freq: Two times a day (BID) | ORAL | 2 refills | Status: DC

## 2023-09-23 ENCOUNTER — Ambulatory Visit (HOSPITAL_BASED_OUTPATIENT_CLINIC_OR_DEPARTMENT_OTHER): Payer: Medicare Other | Attending: Family Medicine | Admitting: Physical Therapy

## 2023-09-23 ENCOUNTER — Encounter (HOSPITAL_BASED_OUTPATIENT_CLINIC_OR_DEPARTMENT_OTHER): Payer: Self-pay | Admitting: Physical Therapy

## 2023-09-23 DIAGNOSIS — M25511 Pain in right shoulder: Secondary | ICD-10-CM | POA: Diagnosis not present

## 2023-09-23 DIAGNOSIS — M25512 Pain in left shoulder: Secondary | ICD-10-CM | POA: Insufficient documentation

## 2023-09-23 DIAGNOSIS — M6281 Muscle weakness (generalized): Secondary | ICD-10-CM | POA: Diagnosis not present

## 2023-09-23 NOTE — Therapy (Signed)
 OUTPATIENT PHYSICAL THERAPY UPPER EXTREMITY TREATMENT/Discharge    Patient Name: Eileen Hansen MRN: [id number] DOB:12/01/1945, 78 y.o., female Today's Date: 09/25/2023  END OF SESSION:  PT End of Session - 09/25/23 2028     Visit Number 22    Number of Visits 24    Date for PT Re-Evaluation 10/28/23    Authorization Type progress note done a visit 19    PT Start Time 1345    PT Stop Time 1428    PT Time Calculation (min) 43 min    Activity Tolerance Patient tolerated treatment well    Behavior During Therapy [facility] for tasks assessed/performed                                 Past Medical History:  Diagnosis Date   Allergic rhinitis    Arthralgia 09/30/2015   Arthritis    fingers   Bruises easily and skin thin 09/11/2021   Chronic  bilateral shoulder pain left shoulder limited rom 12/07/2016   Heart murmur 12/07/2016   HTN (hypertension)    Lexiscan Myoview 04/2021    Myoview 10/22: EF 78, no ischemia or infarction; low risk   mild Aortic valve insufficiency    Mild Mitral valve regurgitation    PAD (peripheral artery disease) (HCC)    Pain in the wrist, unspecified laterality 03/03/2014   Personal history of COVID-19 04/2021   mild symptoms x 1 week all symptoms resolved   Prolapse of anterior vaginal wall    Tachycardia    Episodic   Upper back pain    Wears glasses for reading    Wears partial denture upper    Past Surgical History:  Procedure Laterality Date   ANTERIOR AND POSTERIOR REPAIR WITH SACROSPINOUS FIXATION N/A 09/14/2021   Procedure: ANTERIOR REPAIR WITH SACROSPINOUS FIXATION;  Surgeon: [name], MD;  Location: [facility];  Service: Gynecology;  Laterality: N/A;   APPENDECTOMY  78 yrs old   BUNIONECTOMY Bilateral    more than 40 yrs ago   colonscopy  2018   CYSTOSCOPY N/A 09/14/2021   Procedure: CYSTOSCOPY;  Surgeon: [name], MD;  Location: [facility];  Service: Gynecology;   Laterality: N/A;   WISDOM TOOTH EXTRACTION  78 yrs old   Patient Active Problem List   Diagnosis Date Noted   PAD (peripheral artery disease) (HCC) 08/18/2022   Myalgia 08/18/2022   Uterovaginal prolapse, incomplete 09/14/2021   Right calf pain 03/24/2021   Primary osteoarthritis of right knee 12/24/2019   Sciatica, right side 10/29/2019   RUQ pain 07/20/2018   Influenza A 07/18/2018   Senile ecchymosis 02/03/2018   Atypical chest pain 04/17/2017   Bronchitis, acute 01/09/2017   Heart murmur 12/07/2016   Chronic shoulder pain 12/07/2016   Epistaxis 09/30/2015   Tinnitus of right ear 09/30/2015   Left arm pain 09/30/2015   Left hip pain 09/30/2015   Generalized osteoarthritis of multiple sites 09/30/2015   Medicare annual wellness visit, subsequent 04/14/2015   Globus sensation 02/23/2015   Paresthesia 11/08/2014   Osteopenia 04/15/2014   Prediabetes 04/07/2014   Pain in the wrist, unspecified laterality 03/03/2014   Paresthesia of left arm 11/16/2013   Lateral epicondylitis of left elbow 04/19/2013   Hematochezia 04/19/2013   DDD (degenerative disc disease), cervical 05/16/2012   Preventative health care 03/31/2011   JOINT PAIN, HAND 07/23/2010   DISTURBANCE OF SKIN SENSATION 07/04/2009  Hyperlipidemia, mixed 07/04/2009   Constipation 12/02/2008   TENESMUS 12/02/2008   Tachycardia 11/07/2007   Essential hypertension 06/01/2007   ALLERGIC RHINITIS 06/01/2007    PCP: [name], [name], MD  REFERRING PROVIDER: [name], [name], MD   REFERRING DIAG: Bilateral shoulder pain, unspecified chronicity   THERAPY DIAG:  Bilateral shoulder pain, unspecified chronicity  Muscle weakness (generalized)  Rationale for Evaluation and Treatment: Rehabilitation  ONSET DATE: 7 yrs  SUBJECTIVE:                                                                                                                                                                                       SUBJECTIVE STATEMENT:  The patient's left shoulder remains sore.  She feels it is better than it was before last visit.  Hand dominance: Right  PERTINENT HISTORY: As per md: She has received shoulder injections from orthopedics in the past. Her condition has worsened since her last visit in January. (PCP)  PAIN:  Are you having pain? No 0/10 now, in some positions pain can still get high up to 5/10  PRECAUTIONS: None  RED FLAGS: None   WEIGHT BEARING RESTRICTIONS: No  FALLS:  Has patient fallen in last 6 months? No  LIVING ENVIRONMENT: Lives with: lives with their family and lives with their spouse Lives in: House/apartment   OCCUPATION: Retired school teachers  PLOF: Independent  PATIENT GOALS: Be able to sleep without pain when I move.  NEXT MD VISIT: as needed  OBJECTIVE:  Note: Objective measures were completed at Evaluation unless otherwise noted.  DIAGNOSTIC FINDINGS:  Feb 2024 IMPRESSION: This MRI of the cervical spine without contrast shows the following: The spinal cord appears normal. At C3-C4, there is central disc protrusion and mild uncovertebral spurring causing mild spinal stenosis but no nerve root compression.  The degenerative changes have progressed compared to the 2013 MRI. At C4-C5, there are stable degenerative changes causing mild spinal stenosis and moderate left foraminal narrowing encroaching on the left C5 nerve root without causing definite compression. At C5-C6 there are degenerative changes but no spinal stenosis or nerve root compression. At C6-C7, degenerative changes cause moderate right foraminal narrowing encroaching upon the right C6 nerve root without causing definite nerve root compression.  The degenerative changes have progressed compared to 2013.  PATIENT SURVEYS :  FOTO Primary score 43% with goal of 59%  COGNITION: Overall cognitive status: Within functional limits for tasks  assessed     SENSATION: WFL  POSTURE: Forward head and rounded shoulders  UPPER EXTREMITY ROM:   Active ROM Right eval Left eval Rt/Lt 10/28 Rt/Lt 11/18 Right  Left  Left 1/16  Shoulder flexion 109 105 120/114 130/100 132 118 115  Shoulder extension 41 24       Shoulder abduction 83 69       Shoulder adduction         Shoulder internal rotation s1 Mid glut   Mid glut  Lateral hip  To mid gluteal   Shoulder external rotation c7 c7   Occiput  c4 To occiput   Elbow flexion         Elbow extension         Wrist flexion         Wrist extension         Wrist ulnar deviation         Wrist radial deviation         Wrist pronation         Wrist supination         (Blank rows = not tested)  UPPER EXTREMITY ROM:  Passive ROM Right eval Left 1/16  Shoulder flexion  125   Shoulder extension    Shoulder abduction    Shoulder adduction    Shoulder extension    Shoulder internal rotation    Shoulder external rotation    Elbow flexion    Elbow extension  30 with significant crepitus   Wrist flexion    Wrist extension    Wrist ulnar deviation    Wrist radial deviation    Wrist pronation    Wrist supination     (Blank rows = not tested)   UPPER EXTREMITY MMT:  MMT Right eval Left eval Right  Left  Left 1/16   Shoulder flexion 3/5 in available range 3 limited to pain 4 3+ 4  Shoulder extension       Shoulder abduction 3- limited to pain 3- limited to pain     Shoulder adduction       Shoulder internal rotation   4 3+ 4  Shoulder external rotation   3+ 3 3+  Middle trapezius 3 3     Lower trapezius       Elbow flexion 4P! 4P!     Elbow extension       Wrist flexion       Wrist extension       Wrist ulnar deviation       Wrist radial deviation       Wrist pronation       Wrist supination       Grip strength (lbs)       (Blank rows = not tested)  SHOULDER SPECIAL TESTS: Rotator cuff assessment: Empty can test: positive    PALPATION:  TTP   TODAY'S  TREATMENT:    3/7   Manual: All PROM performed with distraction to reduce pain and improve movement; trigger point release to upper trap/ posterior RTC and scapular area; and anterior shoulder. Grade I and II inferior and posterior glide to reduce pain. Reviewed self dsoft tissue mobilization with thera-cane. Performed on both arms today.    There-ex:  Pulley's flexion in pain free range 2 min   There-Act Wand press 2x10 2lb  Wand flexion in available range supine 2x12 2lb  Neuro-re ed:  ABC each arm 1x  Row red 2x10 both with red band  Extension 2x10 both with red band   2/28 Manual: All PROM performed with distraction to reduce pain and improve movement; trigger point release to upper trap/ posterior RTC and scapular area; and anterior shoulder. Grade I and  II inferior and posterior glide to reduce pain. Reviewed self dsoft tissue mobilization with thera-cane   There-ex:  Pulley's flexion in pain free range 2 min   Neuro-re ed  Pendulums 5 min wth mod cuing  Table reaches in multiple directions pain free   Reviewed HEP and how to use the things from today to reduce pain.   2/14 Manual: All PROM performed with distraction to reduce pain and improve movement Prom into all available ranges There-ex:  Seated press out 1lb L 2x10, 2lb R 2x10 Bicep curls 3# R 2x10, 2# 2x10L Wand press 2x10 2lb  Wand flexion in available range supine 2x12 2lb     Neuro-re-ed:  Row RTB x30 Shoulder extension 3x10 yellow Supine ABC bilateral    2/6 Manual: All PROM performed with distraction to reduce pain and improve movement Prom into all available ranges; trigger point release to upper trap  There-ex:  Bilateral er 3x10 yellow  Wand press 3x10 2lb    There-Act Wand flexion in available range supine 3x10   Neuro-re-ed:  Row 2x10  Shoulder extension 3x10 yellow Supine ABC bilateral  1/16  Performed re-assessment on the shoulder   Manual: PROM into all  planes  Supine chest press 2# 2x12  Supine wand flexion 2# 2x12     07/22/23  PROM as tolerated bil shoulders supine  GHJ mobilizations post and if glides grade II-III L shoulder Supine wand flexion 2# 2x12 partial range  Supine chest press 2# 2x12 Seated press out 2x12ea Resisted row RTB 2x10   07/19/23  PROM as tolerated bil shoulders supine  GHJ mobilizations post and if glides grade II-III Supine wand flexion 2# 2x12 partial range  Supine chest press 2# 2x12 Seated press out 2x12ea Resisted row RTB 2x10  07/12/23  TherEx  UBE L1 x3 min forward/3 min backward with MinA to get started at first but then pain free- just hard  PROM as tolerated B shoulders supine  Supine wand flexion 2# 2x12 partial range  Supine chest press 2# 2x12 Backwards shoulder circles x20 cues for ROM Scapular retractions x15                                                                                                                                           PATIENT EDUCATION: Education details: Discussed eval findings, rehab rationale, aquatic program progression/POC and pools in area. Patient is in agreement  Person educated: Patient Education method: Explanation Education comprehension: verbalized understanding  HOME EXERCISE PROGRAM: Access Code: [email or web address] URL: [email or web address]   ASSESSMENT:  CLINICAL IMPRESSION: The patient has reached max benefit for physical therapy at this time.  She has complete HEP to work on at home.  She has exercises for when it sore and things are doing for once feeling better.  She continues to have significant limitations in motion and functional use in both arms.  Her left  arm is slightly more limited.  At this time she may benefit from seeing an orthopedic surgeon to discuss further options.  At this time the patient is not interested in surgery but she may be a candidate for injections.  See below for goal specific  progress..   Eval: Patient is a 78 y.o. f who was seen today for physical therapy evaluation and treatment for shoulder pain. She reports many years of discomfort receiving PT for in past without any improvement.  She has had steroid injections but did not return to ortho because she was not ready for any surgical procedure.  She presents today with limited bilateral ROM, pain and strength deficits throughout shoulders.  She has tenderness with palpation bilaterally about subacromial bursa and rotator cuff insertion.  Pain is constant and interrupts sleep. She will benefit from skilled physical therapy to improve ue movement and strength allowing for decreased difficulty completing ADL's and decreasing pain. See below for goal specific progress     OBJECTIVE IMPAIRMENTS: decreased coordination, decreased ROM, decreased strength, impaired UE functional use, and pain.   ACTIVITY LIMITATIONS: carrying, lifting, bathing, dressing, and reach over head  PARTICIPATION LIMITATIONS: meal prep, cleaning, and laundry  PERSONAL FACTORS: Age and Time since onset of injury/illness/exacerbation are also affecting patient's functional outcome.   REHAB POTENTIAL: Good  CLINICAL DECISION MAKING: Stable/uncomplicated  EVALUATION COMPLEXITY: Low  GOALS: Goals reviewed with patient? Yes  SHORT TERM GOALS: Target date: 05/27/23  Pt will be indep and consistent with HEP to demonstrate commitment to improvement  Baseline: Goal status:achieved 11/2 8  2.  Pt will report decreased pain with ADL's/combing hair Baseline:  Goal status: achieved 11/2 8  3.  Pt will report a decrease in shoulder pain by 2 NPRS to improve comfort Baseline: I continues to have fairly high levels of pain despite physical therapy treatment 3/9  LONG TERM GOALS: Target date: 06/24/23  Pt to meet stated Foto Goal of 59% Baseline: 43% Goal status: Has achieved Foto goal 11/30  2.  Pt will improve shoulder flex and abd ROM by  20D Baseline: see chart Goal status: Improved 10 degrees not met 3/9  3.  Pt will improve shoulder and scapular strength by at least 1 grade Baseline: see chart Goal status: Still limited with external rotation and flexion 3/   4.  Pt will report ability to reach up into cabinet with less discomfort Baseline:  Goal status: progressing 1/16  5.  Pt will report reduced incidence of dropping objects/improved hand strength Baseline:  Continues to have weakness 3/5  PLAN: PT FREQUENCY: 1-2x/week  PT DURATION: 8 weeks12 visits.  Extended to allow for scheduling conflicts  PLANNED INTERVENTIONS: Therapeutic exercises, Therapeutic activity, Neuromuscular re-education, Balance training, Gait training, Patient/Family education, Self Care, Joint mobilization, DME instructions, Aquatic Therapy, Dry Needling, Cryotherapy, Moist heat, Taping, Ionotophoresis 4mg /ml Dexamethasone, Manual therapy, and Re-evaluation  PLAN FOR NEXT SESSION: Land based: ROM and strengthening bilat ue shoulder complex. Consider TB with progressions as appropriate      Dessie Coma, PT 09/25/2023, 8:31 PM

## 2023-10-18 ENCOUNTER — Encounter: Payer: Self-pay | Admitting: Family Medicine

## 2023-10-18 ENCOUNTER — Ambulatory Visit (INDEPENDENT_AMBULATORY_CARE_PROVIDER_SITE_OTHER): Admitting: Family Medicine

## 2023-10-18 VITALS — BP 138/82 | HR 59 | Temp 98.1°F | Resp 16 | Ht 59.0 in | Wt 123.8 lb

## 2023-10-18 DIAGNOSIS — J309 Allergic rhinitis, unspecified: Secondary | ICD-10-CM

## 2023-10-18 DIAGNOSIS — R6889 Other general symptoms and signs: Secondary | ICD-10-CM

## 2023-10-18 DIAGNOSIS — E782 Mixed hyperlipidemia: Secondary | ICD-10-CM | POA: Diagnosis not present

## 2023-10-18 DIAGNOSIS — M629 Disorder of muscle, unspecified: Secondary | ICD-10-CM | POA: Diagnosis not present

## 2023-10-18 DIAGNOSIS — R7303 Prediabetes: Secondary | ICD-10-CM

## 2023-10-18 DIAGNOSIS — I10 Essential (primary) hypertension: Secondary | ICD-10-CM

## 2023-10-18 DIAGNOSIS — R202 Paresthesia of skin: Secondary | ICD-10-CM | POA: Diagnosis not present

## 2023-10-18 DIAGNOSIS — S76319A Strain of muscle, fascia and tendon of the posterior muscle group at thigh level, unspecified thigh, initial encounter: Secondary | ICD-10-CM

## 2023-10-18 NOTE — Progress Notes (Signed)
 ACUTE VISIT Chief Complaint  Patient presents with   Leg Pain   mucus   HPI: [name] is a 78 y.o. female with a PMHx signficant for HTN, PAD, OA, chronic pain, HLD, and prediabetes, who is here today with a few concerns.  Leg pain:  Patient complains of pain in the back of her knees for about a month. She describes the pain as a burning sensation that worsens when she stands or sits for too long. It radiates up and down from her knee. She rates the pain as an 8/10, and says the right side is worse than the left.  Also endorses some decreased ROM of her knees.  Chronic calf pain, states that this is different. Denies edema or redness in her legs, or back pain.    PAD, she follows with vascular.  Allergy symptoms:  Patient also complains of allergy symptoms for about 2 weeks. She endorses congestion and significant postnasal drainage. Postnasal drainage is very thick and she sometimes feels like she is getting choked with mucus, cannot bring it up. She has not tried OTC medications.  Pertinent negatives include fever, cough, SOB, wheezing or heartburn.   Memory problems:  Patient also complains of occasional forgetfulness. Sometimes she doe snot recall details of conversation she had the day before. She thought is was caused by statin med but dod not improve after discontinuing it.  HTN:  Inquires about the reason of taking several BP meds. Currently on lisinopril 20 mg daily, carvedilol 12.5 mg twice daily, and hydrochlorothiazide 25 mg daily Negative for severe/frequent headache, visual changes, chest pain, dyspnea, palpitation, or focal weakness.  HLD: Currently on Lovaza 1 g twice daily, this medication is expensive.  No longer taking atorvastatin due to muscle aches. Diet: She tries to avoid fats in her diet.  Would like to schedule labs.  Lab Results  Component Value Date   CHOL 226 (H) 06/15/2023   HDL 32 (L) 06/15/2023   LDLCALC 165 (H) 06/15/2023    LDLDIRECT 152.0 06/25/2019   TRIG 156 (H) 06/15/2023   CHOLHDL 7.1 (H) 06/15/2023   Prediabetes: Negative for polydipsia,polyuria, or polyphagia. HgA1C was 6.1 in 02/2023.  Mentions that she is taking OTC supplements. She takes vitamin B-12 1000 mcg twice per week. Also taking Magnesium 200 mg at bedtime and collagen supplements.   Denies any recent falls.   Review of Systems  Constitutional:  Positive for fatigue. Negative for activity change, appetite change and fever.  HENT:  Negative for mouth sores, nosebleeds and sore throat.   Respiratory:  Negative for cough and wheezing.   Gastrointestinal:  Negative for abdominal pain, nausea and vomiting.  Endocrine: Negative for cold intolerance and heat intolerance.  Genitourinary:  Negative for decreased urine volume, dysuria and hematuria.  Musculoskeletal:  Positive for arthralgias and myalgias. Negative for gait problem.  Skin:  Negative for pallor and rash.  Allergic/Immunologic: Positive for environmental allergies.  Neurological:  Negative for syncope and facial asymmetry.  Psychiatric/Behavioral:  Negative for confusion and hallucinations.   See other pertinent positives and negatives in HPI.  Current Outpatient Medications on File Prior to Visit  Medication Sig Dispense Refill   Ascorbic Acid (VITAMIN C) 1000 MG tablet Take 1,000 mg by mouth daily.     aspirin EC 81 MG tablet Take 81 mg by mouth daily. Swallow whole.     carvedilol (COREG) 12.5 MG tablet Take 1 tablet (12.5 mg total) by mouth 2 (two) times daily. 180 tablet  2   Cholecalciferol (VITAMIN D3) 25 MCG (1000 UT) CAPS Take 1 capsule by mouth daily.     hydrochlorothiazide (HYDRODIURIL) 25 MG tablet Take 1 tablet (25 mg total) by mouth daily. 90 tablet 2   lisinopril (ZESTRIL) 20 MG tablet Take 1 tablet (20 mg total) by mouth 2 (two) times daily. 180 tablet 2   Magnesium 200 MG TABS Take 1 tablet (200 mg total) by mouth at bedtime. 30 tablet 11   omega-3 acid ethyl  esters (LOVAZA) 1 g capsule Take 1 capsule (1 g total) by mouth 2 (two) times daily. 180 capsule 3   vitamin B-12 (CYANOCOBALAMIN) 1000 MCG tablet Take 1,000 mcg by mouth daily.     atorvastatin (LIPITOR) 10 MG tablet Take 1 tablet (10 mg total) by mouth daily. 90 tablet 3   No current facility-administered medications on file prior to visit.   Past Medical History:  Diagnosis Date   Allergic rhinitis    Arthralgia 09/30/2015   Arthritis    fingers   Bruises easily and skin thin 09/11/2021   Chronic  bilateral shoulder pain left shoulder limited rom 12/07/2016   Heart murmur 12/07/2016   HTN (hypertension)    Lexiscan Myoview 04/2021    Myoview 10/22: EF 78, no ischemia or infarction; low risk   mild Aortic valve insufficiency    Mild Mitral valve regurgitation    PAD (peripheral artery disease) (HCC)    Pain in the wrist, unspecified laterality 03/03/2014   Personal history of COVID-19 04/2021   mild symptoms x 1 week all symptoms resolved   Prolapse of anterior vaginal wall    Tachycardia    Episodic   Upper back pain    Wears glasses for reading    Wears partial denture upper    Allergies  Allergen Reactions   Fenofibrate Other (See Comments)    Abdominal pain   Penicillins Rash    REACTION: Hives Anaphylaxis    Social History   Socioeconomic History   Marital status: Married    Spouse name: Not on file   Number of children: 1   Years of education: Not on file   Highest education level: Not on file  Occupational History   Occupation: Homemaker    Employer: RETIRED  Tobacco Use   Smoking status: Never   Smokeless tobacco: Never  Vaping Use   Vaping status: Never Used  Substance and Sexual Activity   Alcohol use: Yes    Comment:  occ   Drug use: Never   Sexual activity: Not Currently    Comment: lives with husband  Other Topics Concern   Not on file  Social History Narrative   University - [place], Masters Degree - counseling, [employer]    Married '69 - 7 years, divorced; married '82   21 daughter - '72; 2 grandchildren      Regular Exercise -  YES         Social Drivers of Health   Financial Resource Strain: Low Risk  (02/28/2023)   Overall Financial Resource Strain (CARDIA)    Difficulty of Paying Living Expenses: Not hard at all  Food Insecurity: No Food Insecurity (02/28/2023)   Hunger Vital Sign    Worried About Running Out of Food in the Last Year: Never true    Ran Out of Food in the Last Year: Never true  Transportation Needs: No Transportation Needs (02/28/2023)   PRAPARE - Administrator, Civil Service (Medical): No  Lack of Transportation (Non-Medical): No  Physical Activity: Inactive (02/28/2023)   Exercise Vital Sign    Days of Exercise per Week: 0 days    Minutes of Exercise per Session: 0 min  Stress: No Stress Concern Present (02/28/2023)   [employer] of Occupational Health - Occupational Stress Questionnaire    Feeling of Stress : Not at all  Social Connections: Moderately Isolated (02/28/2023)   Social Connection and Isolation Panel [NHANES]    Frequency of Communication with Friends and Family: More than three times a week    Frequency of Social Gatherings with Friends and Family: More than three times a week    Attends Religious Services: Never    Database administrator or Organizations: No    Attends Banker Meetings: Never    Marital Status: Married    Vitals:   10/18/23 1436  BP: 138/82  Pulse: (!) 59  Resp: 16  Temp: 98.1 F (36.7 C)  SpO2: 98%   Body mass index is 25 kg/m.  Physical Exam Vitals and nursing note reviewed.  Constitutional:      General: She is not in acute distress.    Appearance: She is well-developed.  HENT:     Head: Normocephalic and atraumatic.     Mouth/Throat:     Mouth: Mucous membranes are moist.     Pharynx: Oropharynx is clear. Uvula midline.  Eyes:     Conjunctiva/sclera: Conjunctivae normal.  Cardiovascular:      Rate and Rhythm: Regular rhythm. Bradycardia present.     Pulses:          Dorsalis pedis pulses are 2+ on the right side and 2+ on the left side.     Heart sounds: No murmur heard. Pulmonary:     Effort: Pulmonary effort is normal. No respiratory distress.     Breath sounds: Normal breath sounds.  Abdominal:     Palpations: Abdomen is soft. There is no hepatomegaly or mass.     Tenderness: There is no abdominal tenderness.  Musculoskeletal:     Cervical back: Full passive range of motion without pain.     Right knee: Tenderness present.     Left knee: Tenderness present.     Right lower leg: No edema.     Left lower leg: No edema.       Legs:     Comments: Pain upon palpation posterior aspect of knees, distal thigh and proxima calves, L>R. No edema or erythema. Small nodular lesion palpated on back of left knee  Lymphadenopathy:     Cervical: No cervical adenopathy.  Skin:    General: Skin is warm.     Findings: No erythema or rash.  Neurological:     General: No focal deficit present.     Mental Status: She is alert and oriented to person, place, and time.     Cranial Nerves: No cranial nerve deficit.     Gait: Gait normal.  Psychiatric:        Mood and Affect: Affect normal.     Comments: Well groomed, good eye contact.    ASSESSMENT AND PLAN:  Ms. Polidore was seen today for congestion, leg pain, and fatigue.   Lab Results  Component Value Date   HGBA1C 6.2 10/19/2023   Lab Results  Component Value Date   CHOL 200 10/19/2023   HDL 32.00 (L) 10/19/2023   LDLCALC 142 (H) 10/19/2023   LDLDIRECT 152.0 06/25/2019   TRIG 130.0 10/19/2023  CHOLHDL 6 10/19/2023   Lab Results  Component Value Date   VITAMINB12 524 10/19/2023   Lab Results  Component Value Date   TSH 2.10 10/19/2023   Lab Results  Component Value Date   NA 138 10/19/2023   CL 101 10/19/2023   K 4.3 10/19/2023   CO2 31 10/19/2023   BUN 20 10/19/2023   CREATININE 0.73 10/19/2023   GFR  78.11 10/19/2023   CALCIUM 9.8 10/19/2023   ALBUMIN 4.4 06/15/2023   GLUCOSE 125 (H) 10/19/2023   Hamstring tightness of both lower extremities  C/O posterior knee pain, on examination there is tenderness along hamstring muscles, L>R. We discussed possible causes, ? Muscle strain, tightness. Rounded prominence in popliteal fossa, left, ? Baker cyst. We decide to hold on imaging for now. If problem is persistent, will recommend referral to [facility].  She agrees with trying PT.  -     Ambulatory referral to Physical Therapy  Paresthesia Assessment & Plan: Burning sensation of LE's. We discussed possible etiologies. In the past she has had numbness and tingling hands and feet. Has seen neurologist. Monitor for new symptoms. Further recommendations according to lab results.  Orders: -     Vitamin B12; Future -     TSH; Future  Prediabetes Assessment & Plan: HgA1C was 6.1 in 02/2023. Continue a healthy life style for diabetes prevention. Further recommendations according to HgA1C result.  Orders: -     Hemoglobin A1c; Future  Hyperlipidemia, mixed Assessment & Plan: Last LDL 165 in 05/2023. Statins caused myalgias and aggravated arthralgias. We discussed other options like Zetia, bempedoic acid and PCSK9 inh. Follows with cardiologist.  Orders: -     Lipid panel; Future  Essential hypertension Assessment & Plan: BP otherwise adequately controlled. Currently on carvedilol 12.5 mg twice daily, lisinopril 10 mg daily,and HCTZ 25 mg daily. We discussed some side effects of medication, especially beta-blockers, which could be contributing to fatigue. Continue monitoring BP at home as well as following low-salt diet.  Orders: -     Basic metabolic panel with GFR; Future  Forgetfulness Recommend cognitive challenging exercises. If problem starts getting worse, she can arrange appt with her neurologist, Dr Epimenio Foot, last seen in 07/2022 for numbness and  tingling.  Allergic rhinitis, unspecified seasonality, unspecified trigger Assessment & Plan: This could be causing post nasal drainage in the morning. Plain Mucinex may help as well as Flonase nasal spray at bedtime.  I spent a total of 46 minutes in both face to face and non face to face activities for this visit on the date of this encounter. During this time history was obtained and documented, examination was performed, prior labs  reviewed, and assessment/plan discussed.  Return in about 6 months (around 04/18/2024) for chronic problems. Fasting labs tomorrow.Trula Ore, acting as a scribe for Alastair Hennes [name], MD., have documented all relevant documentation on the behalf of Tonita Bills [name], MD, as directed by  Debroah Shuttleworth [name], MD while in the presence of Skylen Spiering [name], MD.   I, Alecxis Baltzell [name], MD, have reviewed all documentation for this visit. The documentation on 10/18/23 for the exam, diagnosis, procedures, and orders are all accurate and complete.  Mercia Dowe G. [name], MD  [facility]. [place] office.

## 2023-10-18 NOTE — Patient Instructions (Signed)
 A few things to remember from today's visit:  Paresthesia - Plan: Vitamin B12, TSH  Prediabetes - Plan: Hemoglobin A1c  Hyperlipidemia, mixed - Plan: Lipid panel  Essential hypertension - Plan: Basic metabolic panel with GFR  Hamstring strain, unspecified laterality, initial encounter - Plan: Ambulatory referral to Physical Therapy  Repatha is an option for cholesterol. If you need refills for medications you take chronically, please call your pharmacy. Do not use My Chart to request refills or for acute issues that need immediate attention. If you send a my chart message, it may take a few days to be addressed, specially if I am not in the office.  Please be sure medication list is accurate. If a new problem present, please set up appointment sooner than planned today.

## 2023-10-19 ENCOUNTER — Other Ambulatory Visit (INDEPENDENT_AMBULATORY_CARE_PROVIDER_SITE_OTHER)

## 2023-10-19 DIAGNOSIS — I10 Essential (primary) hypertension: Secondary | ICD-10-CM | POA: Diagnosis not present

## 2023-10-19 DIAGNOSIS — R202 Paresthesia of skin: Secondary | ICD-10-CM | POA: Diagnosis not present

## 2023-10-19 DIAGNOSIS — E782 Mixed hyperlipidemia: Secondary | ICD-10-CM

## 2023-10-19 DIAGNOSIS — R7303 Prediabetes: Secondary | ICD-10-CM | POA: Diagnosis not present

## 2023-10-19 LAB — BASIC METABOLIC PANEL WITH GFR
BUN: 20 mg/dL (ref 6–23)
CO2: 31 meq/L (ref 19–32)
Calcium: 9.8 mg/dL (ref 8.4–10.5)
Chloride: 101 meq/L (ref 96–112)
Creatinine, Ser: 0.73 mg/dL (ref 0.40–1.20)
GFR: 78.11 mL/min (ref >60.00)
Glucose, Bld: 125 mg/dL — ABNORMAL HIGH (ref 70–99)
Potassium: 4.3 meq/L (ref 3.5–5.1)
Sodium: 138 meq/L (ref 135–145)

## 2023-10-19 LAB — LIPID PANEL
Cholesterol: 200 mg/dL (ref 0–200)
HDL: 32 mg/dL — ABNORMAL LOW (ref >39.00)
LDL Cholesterol: 142 mg/dL — ABNORMAL HIGH (ref 0–99)
NonHDL: 168.22
Total CHOL/HDL Ratio: 6
Triglycerides: 130 mg/dL (ref 0.0–149.0)
VLDL: 26 mg/dL (ref 0.0–40.0)

## 2023-10-19 LAB — TSH: TSH: 2.1 u[IU]/mL (ref 0.35–5.50)

## 2023-10-19 LAB — HEMOGLOBIN A1C: Hgb A1c MFr Bld: 6.2 % (ref 4.6–6.5)

## 2023-10-19 LAB — VITAMIN B12: Vitamin B-12: 524 pg/mL (ref 211–911)

## 2023-10-20 ENCOUNTER — Encounter: Payer: Self-pay | Admitting: Family Medicine

## 2023-10-20 NOTE — Assessment & Plan Note (Addendum)
 Burning sensation of LE's. We discussed possible etiologies. In the past she has had numbness and tingling hands and feet. Has seen neurologist. Monitor for new symptoms. Further recommendations according to lab results.

## 2023-10-20 NOTE — Assessment & Plan Note (Signed)
 BP otherwise adequately controlled. Currently on carvedilol 12.5 mg twice daily, lisinopril 10 mg daily,and HCTZ 25 mg daily.  Continue monitoring BP at home as well as following low-salt diet.

## 2023-10-20 NOTE — Assessment & Plan Note (Signed)
 This could be causing post nasal drainage in the morning. Plain Mucinex may help as well as Flonase nasal spray at bedtime.

## 2023-10-20 NOTE — Assessment & Plan Note (Signed)
 HgA1C was 6.1 in 02/2023. Continue a healthy life style for diabetes prevention. Further recommendations according to HgA1C result.

## 2023-10-20 NOTE — Assessment & Plan Note (Signed)
 Last LDL 165 in 05/2023. Statins caused myalgias and aggravated arthralgias. We discussed other options like Zetia, bempedoic acid and PCSK9 inh. Follows with cardiologist.

## 2023-10-24 ENCOUNTER — Telehealth: Payer: Self-pay | Admitting: Family Medicine

## 2023-10-24 NOTE — Telephone Encounter (Unsigned)
 Copied from CRM [id number]. Topic: Referral - Question >> Oct 24, 2023  3:01 PM Gurney Maxin H wrote: Reason for CRM: Patient states she has a referral to physical therapy but was called from a [place] location, patient states she lives near [facility] and would like to be referred closer to her home, please reach out to patient, thanks.  Jazzlene [phone]

## 2023-10-25 NOTE — Telephone Encounter (Signed)
 Sent to [facility] in [place].

## 2023-11-09 DIAGNOSIS — M25561 Pain in right knee: Secondary | ICD-10-CM | POA: Diagnosis not present

## 2023-11-15 DIAGNOSIS — M25561 Pain in right knee: Secondary | ICD-10-CM | POA: Diagnosis not present

## 2023-11-17 DIAGNOSIS — M25561 Pain in right knee: Secondary | ICD-10-CM | POA: Diagnosis not present

## 2023-11-22 ENCOUNTER — Encounter: Payer: Self-pay | Admitting: Cardiovascular Disease

## 2023-11-22 ENCOUNTER — Ambulatory Visit: Attending: Cardiovascular Disease | Admitting: Cardiovascular Disease

## 2023-11-22 VITALS — BP 123/77 | HR 66 | Ht 59.0 in | Wt 120.4 lb

## 2023-11-22 DIAGNOSIS — T466X5D Adverse effect of antihyperlipidemic and antiarteriosclerotic drugs, subsequent encounter: Secondary | ICD-10-CM

## 2023-11-22 DIAGNOSIS — G720 Drug-induced myopathy: Secondary | ICD-10-CM

## 2023-11-22 DIAGNOSIS — I10 Essential (primary) hypertension: Secondary | ICD-10-CM

## 2023-11-22 DIAGNOSIS — I739 Peripheral vascular disease, unspecified: Secondary | ICD-10-CM | POA: Diagnosis not present

## 2023-11-22 DIAGNOSIS — Z79899 Other long term (current) drug therapy: Secondary | ICD-10-CM

## 2023-11-22 DIAGNOSIS — E782 Mixed hyperlipidemia: Secondary | ICD-10-CM

## 2023-11-22 DIAGNOSIS — T466X5A Adverse effect of antihyperlipidemic and antiarteriosclerotic drugs, initial encounter: Secondary | ICD-10-CM

## 2023-11-22 DIAGNOSIS — M25561 Pain in right knee: Secondary | ICD-10-CM | POA: Diagnosis not present

## 2023-11-22 DIAGNOSIS — R002 Palpitations: Secondary | ICD-10-CM | POA: Diagnosis not present

## 2023-11-22 MED ORDER — EZETIMIBE 10 MG PO TABS: 10.0000 mg | ORAL_TABLET | Freq: Every day | ORAL | 3 refills | Status: AC

## 2023-11-22 NOTE — Progress Notes (Unsigned)
 Cardiology Office Note   Date:  11/22/2023   ID:  Eileen Hansen, DOB 1945-07-28, MRN [id number]  PCP:  [name], [name], MD  Cardiologist: Dr. Ardell Beauvais  No chief complaint on file.      History of Present Illness: Eileen Hansen is a 78 y.o. female who is here today for follow-up visit regarding peripheral arterial disease.   She has known history of mild mitral and aortic regurgitation, essential hypertension, diet-controlled diabetes and hyperlipidemia.  Carotid Doppler in September of 2022 showed mild nonobstructive disease.  She is followed for bilateral calf claudication which is being managed medically.  Most recent Doppler studies in 2023 showed an ABI of 0.94 on the right and 0.78 on the left.  Duplex showed bilateral SFA disease.   She continues to complain of numbness in both feet and also discomfort in the medial side of the left knee with radiation down to the foot.  The symptoms sometimes wake her up from sleep and she also has numbness in both hands.  She does complain of mild left calf discomfort with walking. She stopped rosuvastatin  due to generalized myalgia.  Her symptoms resolved since then.  Past Medical History:  Diagnosis Date   Allergic rhinitis    Arthralgia 09/30/2015   Arthritis    fingers   Bruises easily and skin thin 09/11/2021   Chronic  bilateral shoulder pain left shoulder limited rom 12/07/2016   Heart murmur 12/07/2016   HTN (hypertension)    Lexiscan  Myoview  04/2021    Myoview  10/22: EF 78, no ischemia or infarction; low risk   mild Aortic valve insufficiency    Mild Mitral valve regurgitation    PAD (peripheral artery disease) (HCC)    Pain in the wrist, unspecified laterality 03/03/2014   Personal history of COVID-19 04/2021   mild symptoms x 1 week all symptoms resolved   Prolapse of anterior vaginal wall    Tachycardia    Episodic   Upper back pain    Wears glasses for reading    Wears partial denture upper     Past Surgical  History:  Procedure Laterality Date   ANTERIOR AND POSTERIOR REPAIR WITH SACROSPINOUS FIXATION N/A 09/14/2021   Procedure: ANTERIOR REPAIR WITH SACROSPINOUS FIXATION;  Surgeon: Arma Lamp, MD;  Location: [facility] [place];  Service: Gynecology;  Laterality: N/A;   APPENDECTOMY  78 yrs old   BUNIONECTOMY Bilateral    more than 40 yrs ago   colonscopy  2018   CYSTOSCOPY N/A 09/14/2021   Procedure: CYSTOSCOPY;  Surgeon: Arma Lamp, MD;  Location: [facility];  Service: Gynecology;  Laterality: N/A;   WISDOM TOOTH EXTRACTION  78 yrs old     Current Outpatient Medications  Medication Sig Dispense Refill   Ascorbic Acid (VITAMIN C) 1000 MG tablet Take 1,000 mg by mouth daily.     aspirin  EC 81 MG tablet Take 81 mg by mouth daily. Swallow whole.     carvedilol  (COREG ) 12.5 MG tablet Take 1 tablet (12.5 mg total) by mouth 2 (two) times daily. 180 tablet 2   Cholecalciferol (VITAMIN D3) 25 MCG (1000 UT) CAPS Take 1 capsule by mouth daily.     Collagen-Vitamin C-Biotin (COLLAGEN) 500-50-0.8 MG CAPS Take 6,000 mg by mouth daily.     hydrochlorothiazide  (HYDRODIURIL ) 25 MG tablet Take 1 tablet (25 mg total) by mouth daily. 90 tablet 2   lisinopril  (ZESTRIL ) 20 MG tablet Take 1 tablet (20 mg total) by  mouth 2 (two) times daily. 180 tablet 2   Magnesium  200 MG TABS Take 1 tablet (200 mg total) by mouth at bedtime. 30 tablet 11   omega-3 acid ethyl esters (LOVAZA ) 1 g capsule Take 1 capsule (1 g total) by mouth 2 (two) times daily. 180 capsule 3   vitamin B-12 (CYANOCOBALAMIN ) 1000 MCG tablet Take 1,000 mcg by mouth daily.     atorvastatin  (LIPITOR) 10 MG tablet Take 1 tablet (10 mg total) by mouth daily. 90 tablet 3   No current facility-administered medications for this visit.    Allergies:   Fenofibrate  and Penicillins    Social History:  The patient  reports that she has never smoked. She has never used smokeless tobacco. She reports current alcohol  use. She reports that she does not use drugs.   Family History:  The patient's family history includes Cancer in her brother and father; Diabetes in her mother; Heart attack in her brother and mother; Hyperlipidemia in her mother; Hypertension in her mother; Stroke in her mother.    ROS:  Please see the history of present illness.   Otherwise, review of systems are positive for none.   All other systems are reviewed and negative.    PHYSICAL EXAM: VS:  BP 123/77 (BP Location: Left Arm, Patient Position: Sitting)   Pulse 66   Ht 4\' 11"  (1.499 m)   Wt 120 lb 6.4 oz (54.6 kg)   SpO2 97%   BMI 24.32 kg/m  , BMI Body mass index is 24.32 kg/m. GEN: Well nourished, well developed, in no acute distress  HEENT: normal  Neck: no JVD, carotid bruits, or masses Cardiac: RRR; no murmurs, rubs, or gallops,no edema  Respiratory:  clear to auscultation bilaterally, normal work of breathing GI: soft, nontender, nondistended, + BS MS: no deformity or atrophy  Skin: warm and dry, no rash Neuro:  Strength and sensation are intact Psych: euthymic mood, full affect Vascular: Radial pulse:normal bilaterally.  Femoral: Normal bilaterally.  Dorsalis pedis and posterior tibial are +1 bilaterally.  EKG:  EKG ordered today. EKG showed : Normal sinus rhythm Cannot rule out Anterior infarct (cited on or before 29-Mar-2023) When compared with ECG of 29-Mar-2023 11:00, No significant change was found    Recent Labs: 06/15/2023: ALT 15 10/19/2023: BUN 20; Creatinine, Ser 0.73; Potassium 4.3; Sodium 138; TSH 2.10    Lipid Panel    Component Value Date/Time   CHOL 200 10/19/2023 1110   CHOL 226 (H) 06/15/2023 1010   TRIG 130.0 10/19/2023 1110   HDL 32.00 (L) 10/19/2023 1110   HDL 32 (L) 06/15/2023 1010   CHOLHDL 6 10/19/2023 1110   VLDL 26.0 10/19/2023 1110   LDLCALC 142 (H) 10/19/2023 1110   LDLCALC 165 (H) 06/15/2023 1010   LDLCALC 64 03/26/2020 1259   LDLDIRECT 152.0 06/25/2019 1536      Wt  Readings from Last 3 Encounters:  11/22/23 120 lb 6.4 oz (54.6 kg)  10/18/23 123 lb 12.8 oz (56.2 kg)  06/22/23 115 lb (52.2 kg)          No data to display            ASSESSMENT AND PLAN:  1.  Peripheral arterial disease with intermittent claudication: She continues to have palpable distal pulses.  She does have mild left calf claudication but not severe enough to require revascularization.  I still think that the majority of her symptoms are related to peripheral neuropathy.  Consider treatment with gabapentin .  2.  Essential hypertension: Blood pressure is elevated here but reasonably controlled at home.  Her blood pressure readings are usually around 140/70.  Continue carvedilol , hydrochlorothiazide  and lisinopril .  3.  Hyperlipidemia: she stop taking rosuvastatin  due to generalized myalgia.  Will try atorvastatin  10 mg daily.  4.  Peripheral neuropathy: Consider treatment with gabapentin  or Lyrica .    Disposition:   FU with me in 6 months  Signed,  Antionette Kirks, MD  11/22/2023 12:06 PM    [facility]

## 2023-11-22 NOTE — Patient Instructions (Signed)
 Medication Instructions:  Stop Atorvastatin   Start Zetia 10 mg daily  *If you need a refill on your cardiac medications before your next appointment, please call your pharmacy*  Lab Work: Your physician recommends that you return for lab work in 2 months. Fasting Lipid panel & HFP   Testing/Procedures: NONE ordered at this time of appointment    Follow-Up: At [facility], you and your health needs are our priority.  As part of our continuing mission to provide you with exceptional heart care, our providers are all part of one team.  This team includes your primary Cardiologist (physician) and Advanced Practice Providers or APPs (Physician Assistants and Nurse Practitioners) who all work together to provide you with the care you need, when you need it.  Your next appointment:   6 month(s)  Provider:   Dr. Alvenia Aus    We recommend signing up for the patient portal called "MyChart".  Sign up information is provided on this After Visit Summary.  MyChart is used to connect with patients for Virtual Visits (Telemedicine).  Patients are able to view lab/test results, encounter notes, upcoming appointments, etc.  Non-urgent messages can be sent to your provider as well.   To learn more about what you can do with MyChart, go to [email or web address].

## 2023-11-24 DIAGNOSIS — M25561 Pain in right knee: Secondary | ICD-10-CM | POA: Diagnosis not present

## 2023-11-29 DIAGNOSIS — M25561 Pain in right knee: Secondary | ICD-10-CM | POA: Diagnosis not present

## 2023-12-01 DIAGNOSIS — M25561 Pain in right knee: Secondary | ICD-10-CM | POA: Diagnosis not present

## 2023-12-06 DIAGNOSIS — M25561 Pain in right knee: Secondary | ICD-10-CM | POA: Diagnosis not present

## 2023-12-08 DIAGNOSIS — M25561 Pain in right knee: Secondary | ICD-10-CM | POA: Diagnosis not present

## 2023-12-13 DIAGNOSIS — M25561 Pain in right knee: Secondary | ICD-10-CM | POA: Diagnosis not present

## 2023-12-15 DIAGNOSIS — M25561 Pain in right knee: Secondary | ICD-10-CM | POA: Diagnosis not present

## 2023-12-22 DIAGNOSIS — G8929 Other chronic pain: Secondary | ICD-10-CM | POA: Diagnosis not present

## 2023-12-26 DIAGNOSIS — M25561 Pain in right knee: Secondary | ICD-10-CM | POA: Diagnosis not present

## 2024-01-19 DIAGNOSIS — G43B0 Ophthalmoplegic migraine, not intractable: Secondary | ICD-10-CM | POA: Diagnosis not present

## 2024-01-19 DIAGNOSIS — H43393 Other vitreous opacities, bilateral: Secondary | ICD-10-CM | POA: Diagnosis not present

## 2024-01-19 DIAGNOSIS — H2513 Age-related nuclear cataract, bilateral: Secondary | ICD-10-CM | POA: Diagnosis not present

## 2024-01-31 DIAGNOSIS — R002 Palpitations: Secondary | ICD-10-CM | POA: Diagnosis not present

## 2024-01-31 DIAGNOSIS — I10 Essential (primary) hypertension: Secondary | ICD-10-CM | POA: Diagnosis not present

## 2024-02-01 ENCOUNTER — Ambulatory Visit: Payer: Self-pay | Admitting: Cardiovascular Disease

## 2024-02-01 LAB — LIPID PANEL
Chol/HDL Ratio: 2.9 ratio (ref 0.0–4.4)
Cholesterol, Total: 100 mg/dL (ref 100–199)
HDL: 35 mg/dL — ABNORMAL LOW (ref >39)
LDL Chol Calc (NIH): 47 mg/dL (ref 0–99)
Triglycerides: 91 mg/dL (ref 0–149)
VLDL Cholesterol Cal: 18 mg/dL (ref 5–40)

## 2024-02-01 LAB — HEPATIC FUNCTION PANEL
ALT: 14 IU/L (ref 0–32)
AST: 21 IU/L (ref 0–40)
Albumin: 4.5 g/dL (ref 3.8–4.8)
Alkaline Phosphatase: 59 IU/L (ref 44–121)
Bilirubin Total: 0.5 mg/dL (ref 0.0–1.2)
Bilirubin, Direct: 0.13 mg/dL (ref 0.00–0.40)
Total Protein: 6.6 g/dL (ref 6.0–8.5)

## 2024-02-28 ENCOUNTER — Other Ambulatory Visit: Payer: Self-pay | Admitting: Family Medicine

## 2024-03-02 ENCOUNTER — Ambulatory Visit (INDEPENDENT_AMBULATORY_CARE_PROVIDER_SITE_OTHER): Payer: Medicare Other

## 2024-03-02 VITALS — BP 110/62 | HR 60 | Temp 97.8°F | Ht 59.0 in | Wt 119.8 lb

## 2024-03-02 DIAGNOSIS — Z0000 Encounter for general adult medical examination without abnormal findings: Secondary | ICD-10-CM

## 2024-03-02 NOTE — Patient Instructions (Addendum)
 Eileen Hansen , Thank you for taking time out of your busy schedule to complete your Annual Wellness Visit with me. I enjoyed our conversation and look forward to speaking with you again next year. I, as well as your care team,  appreciate your ongoing commitment to your health goals. Please review the following plan we discussed and let me know if I can assist you in the future. Your Game plan/ To Do List    Referrals: If you haven't heard from the office you've been referred to, please reach out to them at the phone provided.   Follow up Visits: We will see or speak with you next year for your Next Medicare AWV with our clinical staff 03/08/25 @ 10:40a Have you seen your provider in the last 6 months (3 months if uncontrolled diabetes)?   Clinician Recommendations:  Aim for 30 minutes of exercise or brisk walking, 6-8 glasses of water, and 5 servings of fruits and vegetables each day.       This is a list of the screenings recommended for you:  Health Maintenance  Topic Date Due   Zoster (Shingles) Vaccine (1 of 2) Never done   DTaP/Tdap/Td vaccine (2 - Td or Tdap) 03/28/2021   COVID-19 Vaccine (5 - 2024-25 season) 03/20/2023   Flu Shot  02/17/2024   Medicare Annual Wellness Visit  03/02/2025   Pneumococcal Vaccine for age over 37  Completed   DEXA scan (bone density measurement)  Completed   Hepatitis C Screening  Completed   HPV Vaccine  Aged Out   Meningitis B Vaccine  Aged Out    Advanced directives: (Copy Requested) Please bring a copy of your health care power of attorney and living will to the office to be added to your chart at your convenience. You can mail to [facility] [address] [place], [place] [address] or email to [email or web address] Advance Care Planning is important because it:  [x]  Makes sure you receive the medical care that is consistent with your values, goals, and preferences  [x]  It provides guidance to your family and loved ones and  reduces their decisional burden about whether or not they are making the right decisions based on your wishes.  Follow the link provided in your after visit summary or read over the paperwork we have mailed to you to help you started getting your Advance Directives in place. If you need assistance in completing these, please reach out to us  so that we can help you!  See attachments for Preventive Care and Fall Prevention Tips.

## 2024-03-02 NOTE — Progress Notes (Signed)
 Subjective:   Bridgit E Schifano is a 78 y.o. who presents for a Medicare Wellness preventive visit.  As a reminder, Annual Wellness Visits don't include a physical exam, and some assessments may be limited, especially if this visit is performed virtually. We may recommend an in-person follow-up visit with your provider if needed.  Visit Complete: In person    Persons Participating in Visit: Patient.  AWV Questionnaire: No: Patient Medicare AWV questionnaire was not completed prior to this visit.  Cardiac Risk Factors include: advanced age (>39men, >30 women);hypertension     Objective:    Today's Vitals   03/02/24 1057  BP: 110/62  Pulse: 60  Temp: 97.8 F (36.6 C)  TempSrc: Oral  SpO2: 98%  Weight: 119 lb 12.8 oz (54.3 kg)  Height: 4' 11 (1.499 m)   Body mass index is 24.2 kg/m.     03/02/2024   11:11 AM 04/25/2023    1:04 PM 02/28/2023   11:14 AM 02/24/2022    2:22 PM 09/14/2021   12:48 PM 02/13/2021    1:08 PM 02/13/2020    2:14 PM  Advanced Directives  Does Patient Have a Medical Advance Directive? Yes No Yes Yes Yes Yes Yes  Type of Estate agent of [place];Living will  Healthcare Power of [place];Living will Healthcare Power of [place];Living will Healthcare Power of [place];Living will Healthcare Power of [place];Living will Healthcare Power of [place];Living will  Does patient want to make changes to medical advance directive?    No - Patient declined No - Patient declined  No - Patient declined  Copy of Healthcare Power of Attorney in Chart? No - copy requested  No - copy requested No - copy requested  No - copy requested No - copy requested  Would patient like information on creating a medical advance directive?  No - Patient declined         Current Medications (verified) Outpatient Encounter Medications as of 03/02/2024  Medication Sig   Ascorbic Acid (VITAMIN C) 1000 MG tablet Take 1,000 mg by mouth daily.   aspirin  EC 81 MG tablet  Take 81 mg by mouth daily. Swallow whole.   carvedilol  (COREG ) 12.5 MG tablet TAKE 1 TABLET BY MOUTH 2 TIMES DAILY.   Cholecalciferol (VITAMIN D3) 25 MCG (1000 UT) CAPS Take 1 capsule by mouth daily.   Collagen-Vitamin C-Biotin (COLLAGEN) 500-50-0.8 MG CAPS Take 6,000 mg by mouth daily.   ezetimibe  (ZETIA ) 10 MG tablet Take 1 tablet (10 mg total) by mouth daily.   hydrochlorothiazide  (HYDRODIURIL ) 25 MG tablet Take 1 tablet (25 mg total) by mouth daily.   lisinopril  (ZESTRIL ) 20 MG tablet Take 1 tablet (20 mg total) by mouth 2 (two) times daily.   Magnesium  200 MG TABS Take 1 tablet (200 mg total) by mouth at bedtime.   omega-3 acid ethyl esters (LOVAZA ) 1 g capsule Take 1 capsule (1 g total) by mouth 2 (two) times daily.   vitamin B-12 (CYANOCOBALAMIN ) 1000 MCG tablet Take 1,000 mcg by mouth daily.   No facility-administered encounter medications on file as of 03/02/2024.    Allergies (verified) Fenofibrate  and Penicillins   History: Past Medical History:  Diagnosis Date   Allergic rhinitis    Arthralgia 09/30/2015   Arthritis    fingers   Bruises easily and skin thin 09/11/2021   Chronic  bilateral shoulder pain left shoulder limited rom 12/07/2016   Heart murmur 12/07/2016   HTN (hypertension)    Lexiscan  Myoview  04/2021  Myoview  10/22: EF 78, no ischemia or infarction; low risk   mild Aortic valve insufficiency    Mild Mitral valve regurgitation    PAD (peripheral artery disease) (HCC)    Pain in the wrist, unspecified laterality 03/03/2014   Personal history of COVID-19 04/2021   mild symptoms x 1 week all symptoms resolved   Prolapse of anterior vaginal wall    Tachycardia    Episodic   Upper back pain    Wears glasses for reading    Wears partial denture upper    Past Surgical History:  Procedure Laterality Date   ANTERIOR AND POSTERIOR REPAIR WITH SACROSPINOUS FIXATION N/A 09/14/2021   Procedure: ANTERIOR REPAIR WITH SACROSPINOUS FIXATION;  Surgeon: Marilynne Rosaline SAILOR, MD;  Location: [facility];  Service: Gynecology;  Laterality: N/A;   APPENDECTOMY  78 yrs old   BUNIONECTOMY Bilateral    more than 40 yrs ago   colonscopy  2018   CYSTOSCOPY N/A 09/14/2021   Procedure: CYSTOSCOPY;  Surgeon: Marilynne Rosaline SAILOR, MD;  Location: [facility];  Service: Gynecology;  Laterality: N/A;   WISDOM TOOTH EXTRACTION  78 yrs old   Family History  Problem Relation Age of Onset   Cancer Father        melanoma   Diabetes Mother    Hypertension Mother    Hyperlipidemia Mother    Stroke Mother    Heart attack Mother    Heart attack Brother    Cancer Brother        pancreatic   Colon cancer Neg Hx    Breast cancer Neg Hx    Coronary artery disease Neg Hx    Social History   Socioeconomic History   Marital status: Married    Spouse name: Not on file   Number of children: 1   Years of education: Not on file   Highest education level: Not on file  Occupational History   Occupation: Dentist: RETIRED  Tobacco Use   Smoking status: Never   Smokeless tobacco: Never  Vaping Use   Vaping status: Never Used  Substance and Sexual Activity   Alcohol use: Yes    Comment:  occ   Drug use: Never   Sexual activity: Not Currently    Comment: lives with husband  Other Topics Concern   Not on file  Social History Narrative   University - [place], Masters Degree - counseling, Mater's [employer] counseling   Married '69 - 7 years, divorced; married '82   21 daughter - '72; 2 grandchildren      Regular Exercise -  YES         Social Drivers of Health   Financial Resource Strain: Low Risk  (03/02/2024)   Overall Financial Resource Strain (CARDIA)    Difficulty of Paying Living Expenses: Not hard at all  Food Insecurity: No Food Insecurity (03/02/2024)   Hunger Vital Sign    Worried About Running Out of Food in the Last Year: Never true    Ran Out of Food in the Last Year: Never true  Transportation Needs: No  Transportation Needs (03/02/2024)   PRAPARE - Administrator, Civil Service (Medical): No    Lack of Transportation (Non-Medical): No  Physical Activity: Inactive (03/02/2024)   Exercise Vital Sign    Days of Exercise per Week: 0 days    Minutes of Exercise per Session: 0 min  Stress: No Stress Concern Present (03/02/2024)  [employer] of Occupational Health - Occupational Stress Questionnaire    Feeling of Stress: Not at all  Social Connections: Moderately Isolated (03/02/2024)   Social Connection and Isolation Panel    Frequency of Communication with Friends and Family: More than three times a week    Frequency of Social Gatherings with Friends and Family: More than three times a week    Attends Religious Services: Never    Database administrator or Organizations: No    Attends Engineer, structural: Never    Marital Status: Married    Tobacco Counseling Counseling given: Not Answered    Clinical Intake:  Pre-visit preparation completed: Yes  Pain : No/denies pain     BMI - recorded: 24.2 Nutritional Status: BMI of 19-24  Normal Nutritional Risks: None Diabetes: No  Lab Results  Component Value Date   HGBA1C 6.2 10/19/2023   HGBA1C 6.1 03/16/2023   HGBA1C 6.2 08/18/2022     How often do you need to have someone help you when you read instructions, pamphlets, or other written materials from your doctor or pharmacy?: 1 - Never  Interpreter Needed?: No  Information entered by :: Rojelio Blush LPN   Activities of Daily Living     03/02/2024   11:10 AM  In your present state of health, do you have any difficulty performing the following activities:  Hearing? 0  Vision? 0  Difficulty concentrating or making decisions? 0  Walking or climbing stairs? 0  Dressing or bathing? 0  Doing errands, shopping? 0  Preparing Food and eating ? N  Using the Toilet? N  In the past six months, have you accidently leaked urine? N  Do you have  problems with loss of bowel control? N  Managing your Medications? N  Managing your Finances? N  Housekeeping or managing your Housekeeping? N    Patient Care Team: [name], [name], MD as PCP - General (Family Medicine) Hobart Powell BRAVO, MD (Inactive) as PCP - Cardiology (Cardiology) Sarrah Browning, MD as Consulting Physician (Obstetrics and Gynecology) Teressa Toribio SQUIBB, MD (Inactive) as Attending Physician (Gastroenterology) Cleotilde Elspeth CROME, OD (Optometry)  I have updated your Care Teams any recent Medical Services you may have received from other providers in the past year.     Assessment:   This is a routine wellness examination for Maeola.  Hearing/Vision screen Hearing Screening - Comments:: Denies hearing difficulties   Vision Screening - Comments:: Wears rx glasses - up to date with routine eye exams with  Dr Cleotilde   Goals Addressed               This Visit's Progress     Increase physical activity (pt-stated)        Remain active.       Depression Screen     03/02/2024   10:59 AM 06/22/2023   12:55 PM 02/28/2023   11:12 AM 08/18/2022    1:53 PM 07/21/2022    2:03 PM 02/24/2022    2:17 PM 02/16/2022    4:10 PM  PHQ 2/9 Scores  PHQ - 2 Score 0 2 0 0 0 0 0  PHQ- 9 Score  9  0 2 0 2    Fall Risk     03/02/2024   11:10 AM 02/28/2023   11:13 AM 08/18/2022   12:34 PM 07/21/2022    2:03 PM 02/24/2022    2:20 PM  Fall Risk   Falls in the past year? 0 0 0  0 0  Number falls in past yr: 0 0 0 0 0  Injury with Fall? 0 0 0 0 0  Risk for fall due to : No Fall Risks No Fall Risks Other (Comment) No Fall Risks No Fall Risks  Follow up Falls evaluation completed Falls prevention discussed Falls evaluation completed Falls evaluation completed       Data saved with a previous flowsheet row definition    MEDICARE RISK AT HOME:  Medicare Risk at Home Any stairs in or around the home?: No If so, are there any without handrails?: Yes Home free of loose throw rugs in  walkways, pet beds, electrical cords, etc?: Yes Adequate lighting in your home to reduce risk of falls?: Yes Life alert?: No Use of a cane, walker or w/c?: No Grab bars in the bathroom?: No Shower chair or bench in shower?: No Elevated toilet seat or a handicapped toilet?: No  TIMED UP AND GO:  Was the test performed?  Yes  Length of time to ambulate 10 feet: 10 sec Gait steady and fast without use of assistive device  Cognitive Function: 6CIT completed    09/02/2016    3:33 PM  MMSE - Mini Mental State Exam  Orientation to time 5   Orientation to Place 5   Registration 3   Attention/ Calculation 5   Recall 3   Language- name 2 objects 2   Language- repeat 1  Language- follow 3 step command 3   Language- read & follow direction 1   Write a sentence 1   Copy design 1   Total score 30      Data saved with a previous flowsheet row definition        03/02/2024   11:11 AM 02/28/2023   11:14 AM 02/24/2022    2:24 PM 02/13/2020    2:20 PM  6CIT Screen  What Year? 0 points 0 points 0 points 0 points  What month? 0 points 0 points 0 points 0 points  What time? 0 points 0 points 0 points 0 points  Count back from 20 0 points 0 points 0 points 0 points  Months in reverse 0 points 0 points 0 points 0 points  Repeat phrase 0 points 0 points 0 points 0 points  Total Score 0 points 0 points 0 points 0 points    Immunizations Immunization History  Administered Date(s) Administered   Fluad Quad(high Dose 65+) 05/07/2019   Influenza Split 05/11/2012   Influenza Whole 05/31/2007, 05/14/2008, 05/29/2010   Influenza, High Dose Seasonal PF 04/08/2016, 04/11/2017   Influenza,inj,Quad PF,6+ Mos 04/19/2013, 04/05/2014, 04/14/2015   Influenza-Unspecified 05/19/2018   PFIZER(Purple Top)SARS-COV-2 Vaccination 08/07/2019, 08/27/2019, 07/01/2021, 05/28/2022   Pneumococcal Conjugate-13 02/26/2014   Pneumococcal Polysaccharide-23 03/29/2011   Tdap 03/29/2011    Screening Tests Health  Maintenance  Topic Date Due   Zoster Vaccines- Shingrix (1 of 2) Never done   DTaP/Tdap/Td (2 - Td or Tdap) 03/28/2021   COVID-19 Vaccine (5 - 2024-25 season) 03/20/2023   INFLUENZA VACCINE  02/17/2024   Medicare Annual Wellness (AWV)  03/02/2025   Pneumococcal Vaccine: 50+ Years  Completed   DEXA SCAN  Completed   Hepatitis C Screening  Completed   HPV VACCINES  Aged Out   Meningococcal B Vaccine  Aged Out    Health Maintenance  Health Maintenance Due  Topic Date Due   Zoster Vaccines- Shingrix (1 of 2) Never done   DTaP/Tdap/Td (2 - Td or Tdap) 03/28/2021   COVID-19  Vaccine (5 - 2024-25 season) 03/20/2023   INFLUENZA VACCINE  02/17/2024   Health Maintenance Items Addressed:   Additional Screening:  Vision Screening: Recommended annual ophthalmology exams for early detection of glaucoma and other disorders of the eye. Would you like a referral to an eye doctor? No    Dental Screening: Recommended annual dental exams for proper oral hygiene  Community Resource Referral / Chronic Care Management: CRR required this visit?  No   CCM required this visit?  No   Plan:    I have personally reviewed and noted the following in the patient's chart:   Medical and social history Use of alcohol, tobacco or illicit drugs  Patient is not currently taking opioid prescriptions.Current medications and supplements including opioid prescriptions.  Functional ability and status Nutritional status Physical activity Advanced directives List of other physicians Hospitalizations, surgeries, and ER visits in previous 12 months Vitals Screenings to include cognitive, depression, and falls Referrals and appointments  In addition, I have reviewed and discussed with patient certain preventive protocols, quality metrics, and best practice recommendations. A written personalized care plan for preventive services as well as general preventive health recommendations were provided to  patient.   Rojelio LELON Blush, LPN   1/84/7974   After Visit Summary: (In Person-Printed) AVS printed and given to the patient  Notes: Nothing significant to report at this time.

## 2024-04-03 DIAGNOSIS — H25043 Posterior subcapsular polar age-related cataract, bilateral: Secondary | ICD-10-CM | POA: Diagnosis not present

## 2024-04-03 DIAGNOSIS — H18413 Arcus senilis, bilateral: Secondary | ICD-10-CM | POA: Diagnosis not present

## 2024-04-03 DIAGNOSIS — H2513 Age-related nuclear cataract, bilateral: Secondary | ICD-10-CM | POA: Diagnosis not present

## 2024-04-03 DIAGNOSIS — H35372 Puckering of macula, left eye: Secondary | ICD-10-CM | POA: Diagnosis not present

## 2024-04-03 DIAGNOSIS — H2512 Age-related nuclear cataract, left eye: Secondary | ICD-10-CM | POA: Diagnosis not present

## 2024-05-03 ENCOUNTER — Ambulatory Visit (INDEPENDENT_AMBULATORY_CARE_PROVIDER_SITE_OTHER)

## 2024-05-03 DIAGNOSIS — Z23 Encounter for immunization: Secondary | ICD-10-CM

## 2024-05-03 NOTE — Progress Notes (Cosign Needed Addendum)
 Pt received there flu vaccine today and there were no questions or concerns.  Medical screening examination/treatment/procedure(s) were performed by non-physician practitioner and as supervising physician I was immediately available for consultation/collaboration.  I agree with above. Karlynn Noel, MD

## 2024-05-16 ENCOUNTER — Other Ambulatory Visit: Payer: Self-pay | Admitting: Family Medicine

## 2024-05-16 DIAGNOSIS — I10 Essential (primary) hypertension: Secondary | ICD-10-CM

## 2024-06-12 ENCOUNTER — Ambulatory Visit: Attending: Cardiovascular Disease | Admitting: Cardiovascular Disease

## 2024-06-12 ENCOUNTER — Encounter: Payer: Self-pay | Admitting: Cardiovascular Disease

## 2024-06-12 VITALS — BP 150/76 | HR 67 | Resp 16 | Ht 59.0 in | Wt 116.6 lb

## 2024-06-12 DIAGNOSIS — I739 Peripheral vascular disease, unspecified: Secondary | ICD-10-CM | POA: Diagnosis not present

## 2024-06-12 DIAGNOSIS — I10 Essential (primary) hypertension: Secondary | ICD-10-CM

## 2024-06-12 DIAGNOSIS — T466X5D Adverse effect of antihyperlipidemic and antiarteriosclerotic drugs, subsequent encounter: Secondary | ICD-10-CM

## 2024-06-12 DIAGNOSIS — G720 Drug-induced myopathy: Secondary | ICD-10-CM

## 2024-06-12 DIAGNOSIS — E785 Hyperlipidemia, unspecified: Secondary | ICD-10-CM

## 2024-06-12 DIAGNOSIS — R002 Palpitations: Secondary | ICD-10-CM | POA: Diagnosis not present

## 2024-06-12 NOTE — Patient Instructions (Signed)
 Medication Instructions:  Your physician recommends that you continue on your current medications as directed. Please refer to the Current Medication list given to you today.  *If you need a refill on your cardiac medications before your next appointment, please call your pharmacy*  Lab Work: none If you have labs (blood work) drawn today and your tests are completely normal, you will receive your results only by: MyChart Message (if you have MyChart) OR A paper copy in the mail If you have any lab test that is abnormal or we need to change your treatment, we will call you to review the results.  Testing/Procedures: none  Follow-Up: At [facility], you and your health needs are our priority.  As part of our continuing mission to provide you with exceptional heart care, our providers are all part of one team.  This team includes your primary Cardiologist (physician) and Advanced Practice Providers or APPs (Physician Assistants and Nurse Practitioners) who all work together to provide you with the care you need, when you need it.  Your next appointment:   12 month(s)  Provider:   Deatrice Cage, MD    We recommend signing up for the patient portal called MyChart.  Sign up information is provided on this After Visit Summary.  MyChart is used to connect with patients for Virtual Visits (Telemedicine).  Patients are able to view lab/test results, encounter notes, upcoming appointments, etc.  Non-urgent messages can be sent to your provider as well.   To learn more about what you can do with MyChart, go to [email or web address].   Other Instructions

## 2024-06-12 NOTE — Progress Notes (Signed)
 Cardiology Office Note   Date:  06/12/2024   ID:  Eileen Hansen, DOB 1946/03/08, MRN [id number]  PCP:  Jordan, [name], MD  Cardiologist: Dr. Darron  No chief complaint on file.      History of Present Illness: Eileen Hansen is a 78 y.o. female who is here today for follow-up visit regarding peripheral arterial disease.   She has known history of mild mitral and aortic regurgitation, essential hypertension, diet-controlled diabetes and hyperlipidemia.  Carotid Doppler in September of 2022 showed mild nonobstructive disease.  She is followed for bilateral calf claudication which is being managed medically.  Most recent Doppler studies in 2023 showed an ABI of 0.94 on the right and 0.78 on the left.  Duplex showed bilateral SFA disease.   Most of her symptoms were felt to be due to neuropathy and arthritis.  She has no true claudication.  She tried different statins but did not tolerate due to myalgia.  She was started on ezetimibe  and has been tolerating the medication very well with excellent response.  There has been no change in her symptoms.  Past Medical History:  Diagnosis Date   Allergic rhinitis    Arthralgia 09/30/2015   Arthritis    fingers   Bruises easily and skin thin 09/11/2021   Chronic  bilateral shoulder pain left shoulder limited rom 12/07/2016   Heart murmur 12/07/2016   HTN (hypertension)    Lexiscan  Myoview  04/2021    Myoview  10/22: EF 78, no ischemia or infarction; low risk   mild Aortic valve insufficiency    Mild Mitral valve regurgitation    PAD (peripheral artery disease)    Pain in the wrist, unspecified laterality 03/03/2014   Personal history of COVID-19 04/2021   mild symptoms x 1 week all symptoms resolved   Prolapse of anterior vaginal wall    Tachycardia    Episodic   Upper back pain    Wears glasses for reading    Wears partial denture upper     Past Surgical History:  Procedure Laterality Date   ANTERIOR AND POSTERIOR REPAIR WITH  SACROSPINOUS FIXATION N/A 09/14/2021   Procedure: ANTERIOR REPAIR WITH SACROSPINOUS FIXATION;  Surgeon: Marilynne Rosaline SAILOR, MD;  Location: [facility];  Service: Gynecology;  Laterality: N/A;   APPENDECTOMY  78 yrs old   BUNIONECTOMY Bilateral    more than 40 yrs ago   colonscopy  2018   CYSTOSCOPY N/A 09/14/2021   Procedure: CYSTOSCOPY;  Surgeon: Marilynne Rosaline SAILOR, MD;  Location: [facility];  Service: Gynecology;  Laterality: N/A;   WISDOM TOOTH EXTRACTION  78 yrs old     Current Outpatient Medications  Medication Sig Dispense Refill   aspirin  EC 81 MG tablet Take 81 mg by mouth daily. Swallow whole.     carvedilol  (COREG ) 12.5 MG tablet TAKE 1 TABLET BY MOUTH 2 TIMES DAILY. 180 tablet 2   Cholecalciferol (VITAMIN D3) 25 MCG (1000 UT) CAPS Take 1 capsule by mouth daily.     ezetimibe  (ZETIA ) 10 MG tablet Take 1 tablet (10 mg total) by mouth daily. 90 tablet 3   hydrochlorothiazide  (HYDRODIURIL ) 25 MG tablet TAKE 1 TABLET (25 MG TOTAL) BY MOUTH DAILY. 90 tablet 2   lisinopril  (ZESTRIL ) 20 MG tablet Take 1 tablet (20 mg total) by mouth 2 (two) times daily. 180 tablet 2   Magnesium  200 MG TABS Take 1 tablet (200 mg total) by mouth at bedtime. 30 tablet 11  omega-3 acid ethyl esters (LOVAZA ) 1 g capsule Take 1 capsule (1 g total) by mouth 2 (two) times daily. 180 capsule 3   vitamin B-12 (CYANOCOBALAMIN ) 1000 MCG tablet Take 1,000 mcg by mouth daily.     Ascorbic Acid (VITAMIN C) 1000 MG tablet Take 1,000 mg by mouth daily. (Patient not taking: Reported on 06/12/2024)     Collagen-Vitamin C-Biotin (COLLAGEN) 500-50-0.8 MG CAPS Take 6,000 mg by mouth daily. (Patient not taking: Reported on 06/12/2024)     No current facility-administered medications for this visit.    Allergies:   Fenofibrate  and Penicillins    Social History:  The patient  reports that she has never smoked. She has never used smokeless tobacco. She reports current alcohol use. She  reports that she does not use drugs.   Family History:  The patient's family history includes Cancer in her brother and father; Diabetes in her mother; Heart attack in her brother and mother; Hyperlipidemia in her mother; Hypertension in her mother; Stroke in her mother.    ROS:  Please see the history of present illness.   Otherwise, review of systems are positive for none.   All other systems are reviewed and negative.    PHYSICAL EXAM: VS:  BP (!) 150/76 (BP Location: Right Arm, Patient Position: Sitting, Cuff Size: Normal)   Pulse 67   Resp 16   Ht 4' 11 (1.499 m)   Wt 116 lb 9.6 oz (52.9 kg)   SpO2 97%   BMI 23.55 kg/m  , BMI Body mass index is 23.55 kg/m. GEN: Well nourished, well developed, in no acute distress  HEENT: normal  Neck: no JVD, carotid bruits, or masses Cardiac: RRR; no murmurs, rubs, or gallops,no edema  Respiratory:  clear to auscultation bilaterally, normal work of breathing GI: soft, nontender, nondistended, + BS MS: no deformity or atrophy  Skin: warm and dry, no rash Neuro:  Strength and sensation are intact Psych: euthymic mood, full affect Vascular: Radial pulse:normal bilaterally.  Femoral: Normal bilaterally.  Dorsalis pedis and posterior tibial are +1 bilaterally.  EKG:  EKG ordered today. EKG showed : Sinus bradycardia Anterior infarct (cited on or before 29-Mar-2023) When compared with ECG of 22-Nov-2023 12:02, No significant change was found   Recent Labs: 10/19/2023: BUN 20; Creatinine, Ser 0.73; Potassium 4.3; Sodium 138; TSH 2.10 01/31/2024: ALT 14    Lipid Panel    Component Value Date/Time   CHOL 100 01/31/2024 1142   TRIG 91 01/31/2024 1142   HDL 35 (L) 01/31/2024 1142   CHOLHDL 2.9 01/31/2024 1142   CHOLHDL 6 10/19/2023 1110   VLDL 26.0 10/19/2023 1110   LDLCALC 47 01/31/2024 1142   LDLCALC 64 03/26/2020 1259   LDLDIRECT 152.0 06/25/2019 1536      Wt Readings from Last 3 Encounters:  06/12/24 116 lb 9.6 oz (52.9 kg)   03/02/24 119 lb 12.8 oz (54.3 kg)  11/22/23 120 lb 6.4 oz (54.6 kg)          No data to display            ASSESSMENT AND PLAN:  1.  Peripheral arterial disease : Most of her leg symptoms seem to be due to neuropathy and arthritis.  No true claudication and she has no evidence of critical limb ischemia.  She continues to have palpable distal pulses.   2.  Essential hypertension: Blood pressure is mildly elevated but her blood pressure is usually well-controlled.  Will monitor for now.  3.  Hyperlipidemia statin induced myopathy: She did not tolerate rosuvastatin  or atorvastatin .  She is doing extremely well with ezetimibe  with excellent response.  Most recent lipid profile showed an LDL of 47.  4.  Peripheral neuropathy: Mild symptoms overall.  Can consider gabapentin  if there is worsening.    Disposition:   FU with me in 12 months  Signed,  Deatrice Cage, MD  06/12/2024 11:46 AM    [facility]

## 2024-06-22 DIAGNOSIS — H5371 Glare sensitivity: Secondary | ICD-10-CM | POA: Diagnosis not present

## 2024-06-22 DIAGNOSIS — H2512 Age-related nuclear cataract, left eye: Secondary | ICD-10-CM | POA: Diagnosis not present

## 2024-06-29 ENCOUNTER — Other Ambulatory Visit: Payer: Self-pay | Admitting: Family Medicine

## 2024-06-29 DIAGNOSIS — Z79899 Other long term (current) drug therapy: Secondary | ICD-10-CM

## 2024-06-29 DIAGNOSIS — R002 Palpitations: Secondary | ICD-10-CM

## 2024-06-29 DIAGNOSIS — E7800 Pure hypercholesterolemia, unspecified: Secondary | ICD-10-CM

## 2024-06-29 DIAGNOSIS — I739 Peripheral vascular disease, unspecified: Secondary | ICD-10-CM

## 2024-06-29 DIAGNOSIS — I351 Nonrheumatic aortic (valve) insufficiency: Secondary | ICD-10-CM

## 2024-06-29 DIAGNOSIS — I10 Essential (primary) hypertension: Secondary | ICD-10-CM

## 2024-06-29 DIAGNOSIS — I340 Nonrheumatic mitral (valve) insufficiency: Secondary | ICD-10-CM

## 2024-07-04 ENCOUNTER — Telehealth: Payer: Self-pay | Admitting: Family Medicine

## 2024-07-04 NOTE — Telephone Encounter (Signed)
 Patient came in around 2pm on 07/04/2024 for an appointment that she stated she made on line.  This appointment did not get processed and patient was mad that she keeps coming in and is not able to be seen.  Patient insisted on being seen today and was informed we did not have any providers who could see her, patient was offered an appointment for 12/18 at 3pm and she asked when she would need to cancel it, I informed her she would need to cancel by 3pm today and again she was mad, saying that is not fair.  [name] came to talk with her and Eileen Hansen said she didn't want to talk to her, [name] asked if she got a confirmation from her on line appointment and Eileen Hansen said she did not.  Eileen Hansen mentioned her husband not being seen, and when she was asked if she spoke with Dr Jordan about that as she was instructed to do so, Eileen Hansen stated she had not.  Eileen Hansen wanted a number to call to complain and [name] gave her the number.  Eileen Hansen did not make an appointment to be seen.

## 2024-07-05 ENCOUNTER — Ambulatory Visit: Admitting: Adult Health

## 2024-08-07 NOTE — Progress Notes (Signed)
 Eileen Hansen                                          MRN: [id number]   08/07/2024   The [facility] Quality Team Specialist reviewed this patient medical record for the purposes of chart review for care gap closure. The following were reviewed: chart review for care gap closure-controlling blood pressure.    [facility] Quality Team

## 2024-08-22 ENCOUNTER — Other Ambulatory Visit: Payer: Self-pay | Admitting: Family Medicine

## 2025-03-08 ENCOUNTER — Ambulatory Visit
